# Patient Record
Sex: Female | Born: 1980 | Race: White | Hispanic: No | Marital: Married | Smoking: Former smoker
Health system: Southern US, Community
[De-identification: ages and names within clinical notes are randomized; demographics above are authoritative.]

## PROBLEM LIST (undated history)

## (undated) DIAGNOSIS — D649 Anemia, unspecified: Secondary | ICD-10-CM

## (undated) DIAGNOSIS — L719 Rosacea, unspecified: Secondary | ICD-10-CM

## (undated) DIAGNOSIS — D689 Coagulation defect, unspecified: Secondary | ICD-10-CM

## (undated) DIAGNOSIS — E119 Type 2 diabetes mellitus without complications: Secondary | ICD-10-CM

## (undated) DIAGNOSIS — I10 Essential (primary) hypertension: Secondary | ICD-10-CM

## (undated) DIAGNOSIS — R011 Cardiac murmur, unspecified: Secondary | ICD-10-CM

## (undated) DIAGNOSIS — K219 Gastro-esophageal reflux disease without esophagitis: Secondary | ICD-10-CM

## (undated) DIAGNOSIS — E7212 Methylenetetrahydrofolate reductase deficiency: Secondary | ICD-10-CM

## (undated) DIAGNOSIS — D759 Disease of blood and blood-forming organs, unspecified: Secondary | ICD-10-CM

## (undated) DIAGNOSIS — E039 Hypothyroidism, unspecified: Secondary | ICD-10-CM

## (undated) DIAGNOSIS — F411 Generalized anxiety disorder: Secondary | ICD-10-CM

## (undated) DIAGNOSIS — Z1589 Genetic susceptibility to other disease: Secondary | ICD-10-CM

## (undated) DIAGNOSIS — E669 Obesity, unspecified: Secondary | ICD-10-CM

## (undated) DIAGNOSIS — M2550 Pain in unspecified joint: Secondary | ICD-10-CM

## (undated) HISTORY — PX: DILATION AND CURETTAGE OF UTERUS: SHX78

## (undated) HISTORY — DX: Coagulation defect, unspecified: D68.9

## (undated) HISTORY — DX: Essential (primary) hypertension: I10

## (undated) HISTORY — DX: Generalized anxiety disorder: F41.1

## (undated) HISTORY — DX: Hypothyroidism, unspecified: E03.9

## (undated) HISTORY — DX: Rosacea, unspecified: L71.9

## (undated) HISTORY — DX: Obesity, unspecified: E66.9

## (undated) HISTORY — DX: Pain in unspecified joint: M25.50

## (undated) HISTORY — DX: Cardiac murmur, unspecified: R01.1

---

## 2007-01-15 HISTORY — PX: APPENDECTOMY: SHX54

## 2007-11-24 ENCOUNTER — Ambulatory Visit: Payer: Self-pay | Admitting: Unknown Physician Specialty

## 2007-11-30 ENCOUNTER — Emergency Department: Payer: Self-pay | Admitting: Emergency Medicine

## 2008-02-29 ENCOUNTER — Emergency Department: Payer: Self-pay | Admitting: Emergency Medicine

## 2008-12-25 ENCOUNTER — Emergency Department: Payer: Self-pay | Admitting: Emergency Medicine

## 2009-06-07 ENCOUNTER — Ambulatory Visit: Payer: Self-pay | Admitting: Obstetrics & Gynecology

## 2009-06-07 ENCOUNTER — Other Ambulatory Visit: Admission: RE | Admit: 2009-06-07 | Discharge: 2009-06-07 | Payer: Self-pay | Admitting: Obstetrics & Gynecology

## 2009-06-09 ENCOUNTER — Inpatient Hospital Stay: Payer: Self-pay | Admitting: General Surgery

## 2009-07-12 ENCOUNTER — Ambulatory Visit: Payer: Self-pay | Admitting: Obstetrics & Gynecology

## 2009-12-24 ENCOUNTER — Emergency Department: Payer: Self-pay | Admitting: Emergency Medicine

## 2010-02-06 ENCOUNTER — Other Ambulatory Visit: Payer: Self-pay | Admitting: Obstetrics and Gynecology

## 2010-03-06 ENCOUNTER — Observation Stay: Payer: Self-pay

## 2010-04-22 ENCOUNTER — Emergency Department: Payer: Self-pay | Admitting: Emergency Medicine

## 2010-05-29 NOTE — Assessment & Plan Note (Signed)
Anita Massey, Anita Massey          ACCOUNT NO.:  [id number]   MEDICAL RECORD NO.:  [id number]          PATIENT TYPE:  POB   LOCATION:  [facility] at [facility]         FACILITY:  [facility]   PHYSICIAN:  Jaynie Collins, MD     DATE OF BIRTH:  1980-10-12   DATE OF SERVICE:  06/07/2009                                  CLINIC NOTE   The patient is a 30 year old gravida 0 who is here for a repeat Pap  smear and also a rash on her nipples.  The patient also complains of  strong odor with urination which has been present for a few months.  As  for the rash in the nipple, she noted a month ago that she had some  pruritic rash around her nipple on the left.  She did use Neosporin but  it did not get better and it spread to her right side, it is very itchy  and dry and she does not notice any other breast masses or any other  systemic symptoms.  As for the strong odor with urination, she noted  this a few months ago.  She has been treated for UTI a few months ago  but her symptoms recurred.  She has no fevers, chills, sweats, nausea,  vomiting, or any other symptoms.   PAST OB/GYN HISTORY:  Her last menstrual period was 05/25/2009.  Menarche was at age 89.  She has regular cycles with 30 days between  cycles.  Her periods last for 3 to 4 days with medium flow and moderate  to severe pain.  She denies any intermenstrual bleeding.  The patient is  not on any contraception.  She reports her partner withdraws prior to  ejaculation and she does know that this is not a very good form of birth  control but does not mind getting pregnant.  She has never been  pregnant.  She has had two abnormal Pap smears in the past, last one was  in January 2011 and was an ASCUS, high-risk HPV, Pap.  Colposcopy was  significant for not having enough tissue for any diagnosis, so she was  just told she needs another Pap smear.  She has had one mammogram in the  past, which was normal.  She is not indicated for another  mammogram  before the age of 3 as of now.   PAST MEDICAL HISTORY:  Heart palpitations and high blood pressure.   PAST SURGICAL HISTORY:  None.   MEDICATIONS:  Metoprolol.   ALLERGIES:  No known drug allergies.   SOCIAL HISTORY:  The patient lives with her grandmother.  She is  employed.  She does not smoke, drink alcohol, or use any illicit drugs.  She denies any past or current history of sexual or physical abuse.   FAMILY HISTORY:  Remarkable for an extensive history of diabetes, high  blood pressure, and heart disease.  Her grandfather also had lung  cancer.  She denies any gynecologic cancers.   REVIEW OF SYSTEMS:  Remarkable for aching in legs, muscle aches,  fatigue, frequent headaches, dizzy spells, problems with hearing,  problems with breathing, chest pain, nausea, vaginal odor, and vaginal  itching.   PHYSICAL EXAMINATION:  VITAL  SIGNS:  Blood pressure is 129/91, pulse 68,  weight 168 pounds, height 5 feet 3 inches.  GENERAL:  No apparent distress.  HEENT:  Normocephalic, atraumatic.  NECK:  Supple.  No masses.  Normal thyroid.  LUNGS:  Clear to auscultation bilaterally.  HEART:  Regular rate and rhythm.  ABDOMEN:  Soft, nontender, nondistended.  EXTREMITIES:  No cyanosis, clubbing, or edema.  Nontender.  BREASTS:  The patient does have a maculopapular pink rash with some  excoriation noted on bilateral nipples with mild erythema.  No  induration noted.  No abnormal drainage noted.  Her breasts are,  otherwise, symmetric in size and soft, nontender on palpation.  No  abnormal masses, lymphadenopathy, or nipple drainage noted.  PELVIC:  Normal external female genitalia.  Pink, well rugated vagina.  Pap smear was obtained.  On bimanual exam, the patient has a normal-  sized uterus, nontender, and nontender adnexa.   ASSESSMENT AND PLAN:  The patient is a 30 year old gravida 0 here for  her repeat Pap smear and examination.  The patient also complains of a  nipple  rash.  On evaluation of the nipple rash, it is likely candidal  rash.  She was given a prescription for nystatin cream and told to apply  twice a day until she finishes the tube and she was told that if it  worsens or if it persists that she should come back as she might need  further evaluation which may include a biopsy or dermatologic or breast  center evaluation.  As for her preventative health maintenance and  history of cervical dysplasia, we will follow up with this Pap smear.  The role of HPV and abnormal Paps were discussed in detail with the  patient.  She was told that she would be 3 consecutive normal Pap smears  before returning to usual annual screening.  If the Pap smear is  abnormal, we will redo a colposcopy.  As for her strong odor with  urination, the patient urinalysis today showed positive nitrites,  moderate amount of blood, small amount of leukocyte.  A urine culture  was sent but she will be presumptively treated for urinary tract  infection.  She was given a prescription for ciprofloxacin 500 mg p.o.  b.i.d. for 7 days and told that if her symptoms persist after that she  might need evaluation for recurrent cystitis and might need to go to  Urology if this becomes a recurring problem.  The patient was told to  call or come back in if she has any further gynecologic concerns.           ______________________________  Jaynie Collins, MD     UA/MEDQ  D:  06/07/2009  T:  06/08/2009  Job:  [id number]

## 2010-07-19 ENCOUNTER — Inpatient Hospital Stay: Payer: Self-pay

## 2010-07-19 ENCOUNTER — Observation Stay: Payer: Self-pay

## 2011-04-09 DIAGNOSIS — R002 Palpitations: Secondary | ICD-10-CM | POA: Insufficient documentation

## 2011-05-04 ENCOUNTER — Emergency Department: Payer: Self-pay | Admitting: *Deleted

## 2011-05-04 LAB — CBC
HCT: 39 % (ref 35.0–47.0)
HGB: 13.1 g/dL (ref 12.0–16.0)
MCH: 29.5 pg (ref 26.0–34.0)
MCHC: 33.7 g/dL (ref 32.0–36.0)
MCV: 88 fL (ref 80–100)
Platelet: 264 10*3/uL (ref 150–440)
RBC: 4.46 10*6/uL (ref 3.80–5.20)
RDW: 12.2 % (ref 11.5–14.5)
WBC: 12.7 10*3/uL — ABNORMAL HIGH (ref 3.6–11.0)

## 2011-05-04 LAB — COMPREHENSIVE METABOLIC PANEL
Albumin: 3.9 g/dL (ref 3.4–5.0)
Bilirubin,Total: 0.6 mg/dL (ref 0.2–1.0)
Creatinine: 0.93 mg/dL (ref 0.60–1.30)
Glucose: 142 mg/dL — ABNORMAL HIGH (ref 65–99)
Potassium: 3.2 mmol/L — ABNORMAL LOW (ref 3.5–5.1)
SGOT(AST): 22 U/L (ref 15–37)
SGPT (ALT): 34 U/L
Sodium: 140 mmol/L (ref 136–145)

## 2011-05-04 LAB — LIPASE, BLOOD: Lipase: 52 U/L — ABNORMAL LOW (ref 73–393)

## 2011-12-03 ENCOUNTER — Emergency Department: Payer: Self-pay | Admitting: Emergency Medicine

## 2011-12-03 LAB — CBC
HGB: 13.1 g/dL (ref 12.0–16.0)
MCHC: 33.8 g/dL (ref 32.0–36.0)
Platelet: 272 10*3/uL (ref 150–440)
RBC: 4.36 10*6/uL (ref 3.80–5.20)
RDW: 12.5 % (ref 11.5–14.5)
WBC: 9.8 10*3/uL (ref 3.6–11.0)

## 2011-12-03 LAB — URINALYSIS, COMPLETE
Bacteria: NONE SEEN
Ketone: NEGATIVE
Protein: NEGATIVE
RBC,UR: 19 /HPF (ref 0–5)
Specific Gravity: 1.016 (ref 1.003–1.030)
Squamous Epithelial: 2
WBC UR: 1 /HPF (ref 0–5)

## 2011-12-03 LAB — COMPREHENSIVE METABOLIC PANEL
Alkaline Phosphatase: 130 U/L (ref 50–136)
Calcium, Total: 9.6 mg/dL (ref 8.5–10.1)
Chloride: 104 mmol/L (ref 98–107)
Co2: 28 mmol/L (ref 21–32)
EGFR (African American): >60
EGFR (Non-African Amer.): >60
Glucose: 97 mg/dL (ref 65–99)
SGOT(AST): 27 U/L (ref 15–37)
SGPT (ALT): 49 U/L (ref 12–78)
Sodium: 139 mmol/L (ref 136–145)

## 2012-08-03 ENCOUNTER — Emergency Department: Payer: Self-pay | Admitting: Emergency Medicine

## 2012-08-03 LAB — BASIC METABOLIC PANEL
Anion Gap: 7 (ref 7–16)
BUN: 9 mg/dL (ref 7–18)
Calcium, Total: 9.3 mg/dL (ref 8.5–10.1)
Chloride: 109 mmol/L — ABNORMAL HIGH (ref 98–107)
Co2: 23 mmol/L (ref 21–32)
EGFR (Non-African Amer.): >60
Glucose: 90 mg/dL (ref 65–99)
Potassium: 3.4 mmol/L — ABNORMAL LOW (ref 3.5–5.1)

## 2012-08-03 LAB — CBC
HCT: 37.7 % (ref 35.0–47.0)
MCH: 29.8 pg (ref 26.0–34.0)
MCHC: 33.9 g/dL (ref 32.0–36.0)
MCV: 88 fL (ref 80–100)
Platelet: 270 10*3/uL (ref 150–440)
RDW: 12.9 % (ref 11.5–14.5)
WBC: 13 10*3/uL — ABNORMAL HIGH (ref 3.6–11.0)

## 2012-08-03 LAB — HCG, QUANTITATIVE, PREGNANCY: Beta Hcg, Quant.: 25982 m[IU]/mL — ABNORMAL HIGH

## 2012-08-04 LAB — WET PREP, GENITAL

## 2012-08-04 LAB — GC/CHLAMYDIA PROBE AMP

## 2012-08-06 ENCOUNTER — Ambulatory Visit: Payer: Self-pay | Admitting: Obstetrics and Gynecology

## 2012-08-06 LAB — CBC
MCH: 30 pg (ref 26.0–34.0)
MCHC: 34.3 g/dL (ref 32.0–36.0)
MCV: 88 fL (ref 80–100)
Platelet: 263 10*3/uL (ref 150–440)
RDW: 13.1 % (ref 11.5–14.5)
WBC: 10.9 10*3/uL (ref 3.6–11.0)

## 2012-08-09 LAB — PATHOLOGY REPORT

## 2012-11-16 ENCOUNTER — Ambulatory Visit: Payer: Self-pay | Admitting: Oncology

## 2012-12-14 ENCOUNTER — Ambulatory Visit: Payer: Self-pay | Admitting: Oncology

## 2012-12-16 ENCOUNTER — Emergency Department: Payer: Self-pay | Admitting: Emergency Medicine

## 2012-12-16 LAB — COMPREHENSIVE METABOLIC PANEL
Alkaline Phosphatase: 141 U/L — ABNORMAL HIGH
Calcium, Total: 9.9 mg/dL (ref 8.5–10.1)
Creatinine: 0.59 mg/dL — ABNORMAL LOW (ref 0.60–1.30)
EGFR (African American): >60
EGFR (Non-African Amer.): >60
Glucose: 119 mg/dL — ABNORMAL HIGH (ref 65–99)
Potassium: 3.9 mmol/L (ref 3.5–5.1)
SGOT(AST): 37 U/L (ref 15–37)
SGPT (ALT): 33 U/L (ref 12–78)
Sodium: 140 mmol/L (ref 136–145)
Total Protein: 7.7 g/dL (ref 6.4–8.2)

## 2012-12-16 LAB — URINALYSIS, COMPLETE
Bilirubin,UR: NEGATIVE
Glucose,UR: NEGATIVE mg/dL (ref 0–75)
Ph: 6 (ref 4.5–8.0)
Protein: NEGATIVE
RBC,UR: 24 /HPF (ref 0–5)
Squamous Epithelial: 3
WBC UR: 4 /HPF (ref 0–5)

## 2012-12-16 LAB — CBC
HCT: 39.8 % (ref 35.0–47.0)
MCH: 29.3 pg (ref 26.0–34.0)
MCV: 85 fL (ref 80–100)
Platelet: 285 10*3/uL (ref 150–440)
WBC: 12.9 10*3/uL — ABNORMAL HIGH (ref 3.6–11.0)

## 2012-12-17 ENCOUNTER — Ambulatory Visit: Payer: Self-pay | Admitting: Oncology

## 2013-02-10 ENCOUNTER — Observation Stay: Payer: Self-pay | Admitting: Internal Medicine

## 2013-02-10 DIAGNOSIS — R002 Palpitations: Secondary | ICD-10-CM

## 2013-02-10 DIAGNOSIS — I10 Essential (primary) hypertension: Secondary | ICD-10-CM

## 2013-02-10 DIAGNOSIS — E876 Hypokalemia: Secondary | ICD-10-CM

## 2013-02-10 LAB — CBC
HCT: 38.1 % (ref 35.0–47.0)
HGB: 13.2 g/dL (ref 12.0–16.0)
MCH: 30.5 pg (ref 26.0–34.0)
MCHC: 34.7 g/dL (ref 32.0–36.0)
MCV: 88 fL (ref 80–100)
PLATELETS: 266 10*3/uL (ref 150–440)
RBC: 4.32 10*6/uL (ref 3.80–5.20)
RDW: 12.7 % (ref 11.5–14.5)
WBC: 11.5 10*3/uL — ABNORMAL HIGH (ref 3.6–11.0)

## 2013-02-10 LAB — URINALYSIS, COMPLETE
Bilirubin,UR: NEGATIVE
GLUCOSE, UR: NEGATIVE mg/dL (ref 0–75)
Ketone: NEGATIVE
LEUKOCYTE ESTERASE: NEGATIVE
NITRITE: NEGATIVE
PH: 6 (ref 4.5–8.0)
Protein: NEGATIVE
SPECIFIC GRAVITY: 1.004 (ref 1.003–1.030)

## 2013-02-10 LAB — BASIC METABOLIC PANEL
Anion Gap: 7 (ref 7–16)
BUN: 8 mg/dL (ref 7–18)
CREATININE: 0.76 mg/dL (ref 0.60–1.30)
Calcium, Total: 9.1 mg/dL (ref 8.5–10.1)
Chloride: 106 mmol/L (ref 98–107)
Co2: 25 mmol/L (ref 21–32)
EGFR (African American): >60
EGFR (Non-African Amer.): >60
Glucose: 112 mg/dL — ABNORMAL HIGH (ref 65–99)
Osmolality: 275 (ref 275–301)
POTASSIUM: 3.4 mmol/L — AB (ref 3.5–5.1)
Sodium: 138 mmol/L (ref 136–145)

## 2013-02-10 LAB — TSH: Thyroid Stimulating Horm: 3.28 u[IU]/mL

## 2013-02-10 LAB — TROPONIN I
Troponin-I: <0.02 ng/mL
Troponin-I: <0.02 ng/mL
Troponin-I: <0.02 ng/mL

## 2013-02-10 LAB — HCG, QUANTITATIVE, PREGNANCY: Beta Hcg, Quant.: <1 m[IU]/mL — ABNORMAL LOW

## 2013-02-10 LAB — HEMOGLOBIN A1C: HEMOGLOBIN A1C: 5.5 % (ref 4.2–6.3)

## 2013-02-10 LAB — MAGNESIUM: Magnesium: 1.7 mg/dL — ABNORMAL LOW

## 2013-02-11 ENCOUNTER — Telehealth: Payer: Self-pay | Admitting: *Deleted

## 2013-02-11 DIAGNOSIS — R002 Palpitations: Secondary | ICD-10-CM

## 2013-02-11 DIAGNOSIS — I059 Rheumatic mitral valve disease, unspecified: Secondary | ICD-10-CM

## 2013-02-11 DIAGNOSIS — R079 Chest pain, unspecified: Secondary | ICD-10-CM

## 2013-02-11 DIAGNOSIS — I10 Essential (primary) hypertension: Secondary | ICD-10-CM

## 2013-02-11 LAB — MAGNESIUM: Magnesium: 1.8 mg/dL

## 2013-02-11 LAB — CBC WITH DIFFERENTIAL/PLATELET
BASOS PCT: 0.4 %
Basophil #: 0 10*3/uL (ref 0.0–0.1)
Eosinophil #: 0.1 10*3/uL (ref 0.0–0.7)
Eosinophil %: 1.1 %
HCT: 37.2 % (ref 35.0–47.0)
HGB: 13 g/dL (ref 12.0–16.0)
Lymphocyte #: 2.8 10*3/uL (ref 1.0–3.6)
Lymphocyte %: 26.7 %
MCH: 31 pg (ref 26.0–34.0)
MCHC: 35 g/dL (ref 32.0–36.0)
MCV: 89 fL (ref 80–100)
MONO ABS: 0.8 x10 3/mm (ref 0.2–0.9)
Monocyte %: 7.4 %
NEUTROS ABS: 6.8 10*3/uL — AB (ref 1.4–6.5)
Neutrophil %: 64.4 %
PLATELETS: 248 10*3/uL (ref 150–440)
RBC: 4.2 10*6/uL (ref 3.80–5.20)
RDW: 13.1 % (ref 11.5–14.5)
WBC: 10.5 10*3/uL (ref 3.6–11.0)

## 2013-02-11 LAB — BASIC METABOLIC PANEL
ANION GAP: 5 — AB (ref 7–16)
BUN: 9 mg/dL (ref 7–18)
CALCIUM: 9.3 mg/dL (ref 8.5–10.1)
CHLORIDE: 108 mmol/L — AB (ref 98–107)
CO2: 24 mmol/L (ref 21–32)
CREATININE: 0.78 mg/dL (ref 0.60–1.30)
EGFR (Non-African Amer.): >60
Glucose: 95 mg/dL (ref 65–99)
Osmolality: 272 (ref 275–301)
Potassium: 3.9 mmol/L (ref 3.5–5.1)
SODIUM: 137 mmol/L (ref 136–145)

## 2013-02-11 LAB — LIPID PANEL
Cholesterol: 196 mg/dL (ref 0–200)
HDL: 35 mg/dL — AB (ref 40–60)
Ldl Cholesterol, Calc: 127 mg/dL — ABNORMAL HIGH (ref 0–100)
TRIGLYCERIDES: 172 mg/dL (ref 0–200)
VLDL CHOLESTEROL, CALC: 34 mg/dL (ref 5–40)

## 2013-02-15 ENCOUNTER — Emergency Department: Payer: Self-pay | Admitting: Emergency Medicine

## 2013-02-15 LAB — CBC WITH DIFFERENTIAL/PLATELET
BASOS ABS: 0.1 10*3/uL (ref 0.0–0.1)
BASOS PCT: 0.6 %
Eosinophil #: 0.2 10*3/uL (ref 0.0–0.7)
Eosinophil %: 1.3 %
HCT: 39.3 % (ref 35.0–47.0)
HGB: 13.5 g/dL (ref 12.0–16.0)
Lymphocyte #: 3.1 10*3/uL (ref 1.0–3.6)
Lymphocyte %: 25.7 %
MCH: 30.6 pg (ref 26.0–34.0)
MCHC: 34.2 g/dL (ref 32.0–36.0)
MCV: 89 fL (ref 80–100)
Monocyte #: 1.1 x10 3/mm — ABNORMAL HIGH (ref 0.2–0.9)
Monocyte %: 9.3 %
NEUTROS ABS: 7.5 10*3/uL — AB (ref 1.4–6.5)
Neutrophil %: 63.1 %
Platelet: 266 10*3/uL (ref 150–440)
RBC: 4.4 10*6/uL (ref 3.80–5.20)
RDW: 13 % (ref 11.5–14.5)
WBC: 12 10*3/uL — ABNORMAL HIGH (ref 3.6–11.0)

## 2013-02-15 LAB — COMPREHENSIVE METABOLIC PANEL
ALBUMIN: 3.7 g/dL (ref 3.4–5.0)
ALT: 30 U/L (ref 12–78)
Alkaline Phosphatase: 133 U/L — ABNORMAL HIGH
Anion Gap: 2 — ABNORMAL LOW (ref 7–16)
BUN: 11 mg/dL (ref 7–18)
Bilirubin,Total: 0.4 mg/dL (ref 0.2–1.0)
CREATININE: 0.77 mg/dL (ref 0.60–1.30)
Calcium, Total: 9.3 mg/dL (ref 8.5–10.1)
Chloride: 107 mmol/L (ref 98–107)
Co2: 28 mmol/L (ref 21–32)
Glucose: 79 mg/dL (ref 65–99)
Osmolality: 272 (ref 275–301)
Potassium: 3.8 mmol/L (ref 3.5–5.1)
SGOT(AST): 24 U/L (ref 15–37)
Sodium: 137 mmol/L (ref 136–145)
Total Protein: 7.2 g/dL (ref 6.4–8.2)

## 2013-02-15 LAB — LIPASE, BLOOD: Lipase: 84 U/L (ref 73–393)

## 2013-02-15 NOTE — Telephone Encounter (Signed)
Left voicemail for tcm

## 2013-02-16 NOTE — Telephone Encounter (Signed)
Attempted to call patient * 3 for TCM. No reply

## 2013-02-23 ENCOUNTER — Encounter: Payer: Self-pay | Admitting: *Deleted

## 2013-03-01 ENCOUNTER — Encounter: Payer: Self-pay | Admitting: *Deleted

## 2013-03-01 ENCOUNTER — Encounter: Payer: No Typology Code available for payment source | Admitting: Cardiovascular Disease

## 2013-03-01 ENCOUNTER — Encounter: Payer: Self-pay | Admitting: Cardiovascular Disease

## 2013-03-01 ENCOUNTER — Encounter (INDEPENDENT_AMBULATORY_CARE_PROVIDER_SITE_OTHER): Payer: Self-pay

## 2013-03-01 ENCOUNTER — Ambulatory Visit (INDEPENDENT_AMBULATORY_CARE_PROVIDER_SITE_OTHER): Payer: Medicaid Other | Admitting: Cardiovascular Disease

## 2013-03-01 ENCOUNTER — Other Ambulatory Visit: Payer: Self-pay | Admitting: Cardiovascular Disease

## 2013-03-01 VITALS — BP 145/85 | HR 61 | Ht 62.0 in | Wt 172.2 lb

## 2013-03-01 DIAGNOSIS — I10 Essential (primary) hypertension: Secondary | ICD-10-CM

## 2013-03-01 DIAGNOSIS — R0989 Other specified symptoms and signs involving the circulatory and respiratory systems: Secondary | ICD-10-CM | POA: Insufficient documentation

## 2013-03-01 DIAGNOSIS — R079 Chest pain, unspecified: Secondary | ICD-10-CM

## 2013-03-01 NOTE — Progress Notes (Signed)
Primary care physician: Dr. Tomasita Morrow  HPI  This is a pleasant 33 year old female who is here today for a followup visit after recent hospitalization at [facility] for hypertensive urgency. I still the patient in the past in 2010 at [facility] for palpitations. Holter monitor showed no significant arrhythmia other than sinus tachycardia. She had significant chest pain and dyspnea and underwent extensive workup at that time including negative CT of the chest for pulmonary embolism. Echocardiogram showed normal LV systolic function with trace pericardial effusion. Stress test showed no evidence of ischemia. She was noted to be mildly hypertensive and was started on small dose metoprolol with subsequent improvement in her symptoms. She has been trying to get pregnant and had 2 miscarriages in the past few years. She was diagnosed with a hypercoagulable state that might require anticoagulation urine pregnancy. She has been having episodes of sudden increase in blood pressure associated with palpitations. She had an episode recently at 3:00 in the morning. Blood pressure was 201/100 with a heart rate of 106 beats per minute. She went to the emergency room at [facility] and was hospitalized overnight. Cardiac enzymes were negative. Echocardiogram showed normal LV systolic function, mild left ventricular hypertrophy and mild mitral regurgitation. Vital signs were stable. Labetalol was increased to 100 mg twice daily. After hospital discharge, she had another similar episode and went to the emergency room at [facility]. She was not hospitalized. She was seen by her primary care physician recently. The dose of Zoloft was increased she was also given clonazepam for suspected anxiety.  No Known Allergies   No current outpatient prescriptions on file prior to visit.   No current facility-administered medications on file prior to visit.     Past Medical History  Diagnosis Date  . Dysuria   . Anxiety state,  unspecified   . Unspecified essential hypertension   . Rosacea   . Contact dermatitis and other eczema, due to unspecified cause   . Undiagnosed cardiac murmurs   . Palpitations   . Other psoriasis   . Obesity   . Heart palpitations   . Syncope and collapse   . Clotting disorder      Past Surgical History  Procedure Laterality Date  . Appendectomy    . Dilation and curettage of uterus       Family History  Problem Relation Age of Onset  . Mitral valve prolapse Mother   . Arrhythmia Mother   . Hypertension Mother   . Heart disease Maternal Grandfather   . Lung cancer Maternal Grandfather      History   Social History  . Marital Status: Married    Spouse Name: N/A    Number of Children: N/A  . Years of Education: N/A   Occupational History  . Not on file.   Social History Main Topics  . Smoking status: Former Smoker -- 0.25 packs/day for 1 years  . Smokeless tobacco: Not on file  . Alcohol Use: No  . Drug Use: No  . Sexual Activity: Not on file   Other Topics Concern  . Not on file   Social History Narrative  . No narrative on file     PHYSICAL EXAM   BP 145/85  Pulse 61  Ht 5\' 2"  (1.575 m)  Wt 172 lb 4 oz (78.132 kg)  BMI 31.50 kg/m2 Constitutional: She is oriented to person, place, and time. She appears well-developed and well-nourished. No distress.  HENT: No nasal discharge.  Head: Normocephalic and  atraumatic.  Eyes: Pupils are equal and round. No discharge.  Neck: Normal range of motion. Neck supple. No JVD present. No thyromegaly present.  Cardiovascular: Normal rate, regular rhythm, normal heart sounds. Exam reveals no gallop and no friction rub. No murmur heard.  Pulmonary/Chest: Effort normal and breath sounds normal. No stridor. No respiratory distress. She has no wheezes. She has no rales. She exhibits no tenderness.  Abdominal: Soft. Bowel sounds are normal. She exhibits no distension. There is no tenderness. There is no rebound and  no guarding.  Musculoskeletal: Normal range of motion. She exhibits no edema and no tenderness.  Neurological: She is alert and oriented to person, place, and time. Coordination normal.  Skin: Skin is warm and dry. No rash noted. She is not diaphoretic. No erythema. No pallor.  Psychiatric: She has a normal mood and affect. Her behavior is normal. Judgment and thought content normal.     EKG: Normal sinus rhythm with nonspecific T wave changes   ASSESSMENT AND PLAN

## 2013-03-01 NOTE — Patient Instructions (Signed)
Labs today.   24 hour urine collection for labs.   Your physician has requested that you have a renal artery duplex. During this test, an ultrasound is used to evaluate blood flow to the kidneys. Allow one hour for this exam. Do not eat after midnight the day before and avoid carbonated beverages. Take your medications as you usually do.  You can take 2 extra doses of Labetalol if needed (if blood pressure is above 160 ).   Follow up after tests.

## 2013-03-01 NOTE — Assessment & Plan Note (Signed)
The patient is having recurrent episodes of severe hypertension associated with palpitations and shortness of breath. Blood pressure usually goes back to normal quickly. Although this could certainly be due to panic attacks and anxiety, I do think we have to exclude other causes of secondary hypertension especially pheochromocytoma. Thus, I ordered 24 hour urine exam of metanephrine and catecholamines. I will also check aldosterone to renin ratio and cortisol level. TSH has been normal. Check renal artery duplex ultrasound to ensure no fibromuscular dysplasia. In the meanwhile, I instructed not to take an extra dose of labetalol as needed for blood pressure above 809 systolic.

## 2013-03-02 ENCOUNTER — Encounter: Payer: Self-pay | Admitting: Cardiovascular Disease

## 2013-03-03 ENCOUNTER — Telehealth: Payer: Self-pay | Admitting: *Deleted

## 2013-03-03 NOTE — Telephone Encounter (Signed)
Patient called and said she accidentally left her 24 hr urine out for 4 hrs. Patient instructed to call the [facility] lab to see if she needs to start over.

## 2013-03-15 ENCOUNTER — Telehealth: Payer: Self-pay

## 2013-03-15 NOTE — Telephone Encounter (Signed)
Results awaiting MD note. Patient aware.

## 2013-03-15 NOTE — Telephone Encounter (Signed)
Pt would like 24 hr urine test results. Please call.

## 2013-03-18 ENCOUNTER — Telehealth: Payer: Self-pay | Admitting: *Deleted

## 2013-03-18 ENCOUNTER — Ambulatory Visit: Payer: No Typology Code available for payment source | Admitting: Cardiovascular Disease

## 2013-03-18 NOTE — Telephone Encounter (Signed)
Normal so far.

## 2013-03-18 NOTE — Telephone Encounter (Signed)
Reviewed results with patient. 

## 2013-03-18 NOTE — Telephone Encounter (Signed)
Patient called wanting results of 24 urine test

## 2013-03-26 ENCOUNTER — Encounter (INDEPENDENT_AMBULATORY_CARE_PROVIDER_SITE_OTHER): Payer: No Typology Code available for payment source

## 2013-03-26 DIAGNOSIS — I10 Essential (primary) hypertension: Secondary | ICD-10-CM

## 2013-03-30 ENCOUNTER — Ambulatory Visit (INDEPENDENT_AMBULATORY_CARE_PROVIDER_SITE_OTHER): Payer: No Typology Code available for payment source | Admitting: Cardiovascular Disease

## 2013-03-30 ENCOUNTER — Encounter: Payer: Self-pay | Admitting: Cardiovascular Disease

## 2013-03-30 VITALS — BP 122/86 | HR 72 | Ht 62.0 in | Wt 174.5 lb

## 2013-03-30 DIAGNOSIS — I10 Essential (primary) hypertension: Secondary | ICD-10-CM

## 2013-03-30 DIAGNOSIS — R079 Chest pain, unspecified: Secondary | ICD-10-CM

## 2013-03-30 NOTE — Patient Instructions (Signed)
Continue same medications.   Your physician wants you to follow-up in: 6 months.  You will receive a reminder letter in the mail two months in advance. If you don't receive a letter, please call our office to schedule the follow-up appointment.  

## 2013-03-30 NOTE — Progress Notes (Signed)
Primary care physician: Dr. Tomasita Morrow  HPI  This is a pleasant 33 year old female who is here today for a followup visit regarding hypertension.  I saw the patient in the past in 2010 at [facility] for palpitations. Holter monitor showed no significant arrhythmia other than sinus tachycardia. She had significant chest pain and dyspnea and underwent extensive workup at that time including negative CT of the chest for pulmonary embolism. Echocardiogram showed normal LV systolic function with trace pericardial effusion. Stress test showed no evidence of ischemia. She was noted to be mildly hypertensive and was started on small dose metoprolol with subsequent improvement in her symptoms. She has been trying to get pregnant and had 2 miscarriages in the past few years. She was diagnosed with a hypercoagulable state that might require anticoagulation urine pregnancy. She was seen recently for  episodes of sudden increase in blood pressure associated with palpitations. She had an episode which required hospitalization at [facility]. Blood pressure was 201/100 with a heart rate of 106 beats per minute.  Echocardiogram showed normal LV systolic function, mild left ventricular hypertrophy and mild mitral regurgitation. Vital signs were stable. Labetalol was increased to 100 mg twice daily. After hospital discharge, she had another similar episode and went to the emergency room at [facility]. The dose of Zoloft was increased she was also given clonazepam for suspected anxiety. I proceeded with evaluation for secondary hypertension. Renal artery duplex showed no evidence artery stenosis. Aldosterone to renin ratio was normal. Cortisol level was normal. 24 hour urine collection for catecholamines and metanephrines was also normal. She has been doing well with no further episodes.  No Known Allergies   Current Outpatient Prescriptions on File Prior to Visit  Medication Sig Dispense Refill  . clonazePAM  (KLONOPIN) 0.5 MG tablet Take 0.5 mg by mouth 2 (two) times daily as needed for anxiety.      . folic acid (FOLVITE) 1 MG tablet Take 3 mg by mouth daily.      Marland Kitchen labetalol (NORMODYNE) 100 MG tablet Take 100 mg by mouth 2 (two) times daily.      . metroNIDAZOLE (METROCREAM) 0.75 % cream Apply 1 application topically 2 (two) times daily.      . Prenatal Vit-Fe Fumarate-FA (PRENATAL MULTIVITAMIN) TABS tablet Take 1 tablet by mouth daily at 12 noon.      . promethazine (PHENERGAN) 25 MG tablet Take 12.5-25 mg by mouth every 6 (six) hours as needed for nausea or vomiting.      . sertraline (ZOLOFT) 50 MG tablet Take 50 mg by mouth daily.       No current facility-administered medications on file prior to visit.     Past Medical History  Diagnosis Date  . Hypertension   . Dysuria   . Anxiety state, unspecified   . Unspecified essential hypertension   . Rosacea   . Contact dermatitis and other eczema, due to unspecified cause   . Undiagnosed cardiac murmurs   . Palpitations   . Other psoriasis   . Obesity   . Heart palpitations   . Syncope and collapse   . Clotting disorder      Past Surgical History  Procedure Laterality Date  . Appendectomy    . Dilation and curettage of uterus       Family History  Problem Relation Age of Onset  . Mitral valve prolapse Mother   . Arrhythmia Mother   . Hypertension Mother   . Heart disease Maternal Grandfather   .  Lung cancer Maternal Grandfather      History   Social History  . Marital Status: Married    Spouse Name: N/A    Number of Children: N/A  . Years of Education: N/A   Occupational History  . Not on file.   Social History Main Topics  . Smoking status: Former Smoker -- 0.25 packs/day for 1 years  . Smokeless tobacco: Not on file  . Alcohol Use: No  . Drug Use: No  . Sexual Activity: Not on file   Other Topics Concern  . Not on file   Social History Narrative   ** Merged History Encounter **         PHYSICAL  EXAM   BP 122/86  Pulse 72  Ht 5\' 2"  (1.575 m)  Wt 174 lb 8 oz (79.153 kg)  BMI 31.91 kg/m2 Constitutional: She is oriented to person, place, and time. She appears well-developed and well-nourished. No distress.  HENT: No nasal discharge.  Head: Normocephalic and atraumatic.  Eyes: Pupils are equal and round. No discharge.  Neck: Normal range of motion. Neck supple. No JVD present. No thyromegaly present.  Cardiovascular: Normal rate, regular rhythm, normal heart sounds. Exam reveals no gallop and no friction rub. No murmur heard.  Pulmonary/Chest: Effort normal and breath sounds normal. No stridor. No respiratory distress. She has no wheezes. She has no rales. She exhibits no tenderness.  Abdominal: Soft. Bowel sounds are normal. She exhibits no distension. There is no tenderness. There is no rebound and no guarding.  Musculoskeletal: Normal range of motion. She exhibits no edema and no tenderness.  Neurological: She is alert and oriented to person, place, and time. Coordination normal.  Skin: Skin is warm and dry. No rash noted. She is not diaphoretic. No erythema. No pallor.  Psychiatric: She has a normal mood and affect. Her behavior is normal. Judgment and thought content normal.     EKG: Normal sinus rhythm with nonspecific T wave changes   ASSESSMENT AND PLAN

## 2013-03-30 NOTE — Assessment & Plan Note (Signed)
Episodes of hypertensive crises were likely triggered by anxiety. Workup for secondary hypertension was negative as outlined above. There is no evidence of fibromuscular dysplasia or pheochromocytoma. Continue treatment with labetalol. Anxiety seems to have improved significantly with treatment. I don't see a contraindication for pregnancy from a cardiac standpoint.

## 2013-04-29 DIAGNOSIS — R319 Hematuria, unspecified: Secondary | ICD-10-CM | POA: Insufficient documentation

## 2013-04-29 DIAGNOSIS — R300 Dysuria: Secondary | ICD-10-CM | POA: Insufficient documentation

## 2013-05-31 ENCOUNTER — Ambulatory Visit: Payer: Self-pay | Admitting: Oncology

## 2013-06-06 ENCOUNTER — Other Ambulatory Visit: Payer: Self-pay | Admitting: Obstetrics and Gynecology

## 2013-06-06 LAB — HCG, QUANTITATIVE, PREGNANCY: Beta Hcg, Quant.: 1536 m[IU]/mL — ABNORMAL HIGH

## 2013-06-08 ENCOUNTER — Other Ambulatory Visit: Payer: Self-pay | Admitting: Obstetrics and Gynecology

## 2013-06-08 LAB — HCG, QUANTITATIVE, PREGNANCY: Beta Hcg, Quant.: 1562 m[IU]/mL — ABNORMAL HIGH

## 2013-06-14 ENCOUNTER — Ambulatory Visit: Payer: Self-pay | Admitting: Oncology

## 2013-10-25 ENCOUNTER — Encounter: Payer: Self-pay | Admitting: Maternal & Fetal Medicine

## 2014-01-29 ENCOUNTER — Observation Stay: Payer: Self-pay | Admitting: Obstetrics & Gynecology

## 2014-01-29 LAB — URINALYSIS, COMPLETE
Bilirubin,UR: NEGATIVE
GLUCOSE, UR: NEGATIVE mg/dL (ref 0–75)
Nitrite: NEGATIVE
PH: 6 (ref 4.5–8.0)
Protein: NEGATIVE
RBC,UR: 2 /HPF (ref 0–5)
SPECIFIC GRAVITY: 1.009 (ref 1.003–1.030)
Squamous Epithelial: 2
WBC UR: 3 /HPF (ref 0–5)

## 2014-01-30 LAB — URINE CULTURE

## 2014-03-12 ENCOUNTER — Inpatient Hospital Stay: Payer: Self-pay | Admitting: Obstetrics and Gynecology

## 2014-05-06 NOTE — Op Note (Signed)
PATIENT NAME:  Anita Massey, Anita Massey MR#:  [id number] DATE OF BIRTH:  Jul 15, 1980  DATE OF PROCEDURE:  08/06/2012  PREOPERATIVE DIAGNOSIS: Incomplete abortion.  POSTOPERATIVE DIAGNOSIS: Incomplete abortion.  PROCEDURE: Suction curette D and C.   ESTIMATED BLOOD LOSS: Approximately 300 mL.   SURGEON: Delsa Sale, MD  FINDINGS: What appeared to be products of conception (Dictation Anomaly) and pieces placenta along with blood clots. Approximately 12 cm uterus (Dictation Anomaly) to sounding with products of conception seen on suction.   DESCRIPTION OF PROCEDURE: The patient was taken to the Operating Room and placed in supine position. After adequate general endotracheal anesthesia was instilled, the patient was prepped and draped in the usual sterile fashion. A side-opening speculum was placed in the patient's vagina. The anterior lip of her cervix was grasped with a single-tooth tenaculum. The uterus was sounded and was also found to be already dilated. The curette was placed into the uterus and the suction curette was placed after removal of the curette. The patient's uterus was curetted with a curved curette. A curette was then performed circumferentially around the interior of the uterus to be sure there were no products of conception left. With gentle traction, the lining of the uterus was found to have a gritty feeling to it. The uterus was massaged and found to be firm. The single-tooth tenaculum was removed. The side-opening speculum was removed. Clear  urine was noted in the Foley bag. The patient was taken to recovery after having tolerated the procedure well.    ____________________________ Delsa Sale, MD cck:jm D: 08/10/2012 17:29:07 ET T: 08/10/2012 19:56:13 ET JOB#: [id number]  cc: Delsa Sale, MD, <Dictator> Delsa Sale MD ELECTRONICALLY SIGNED 08/12/2012 9:57

## 2014-05-07 NOTE — H&P (Signed)
PATIENT NAME:  Anita Massey, Anita Massey MR#:  [id number] DATE OF BIRTH:  07/07/1980  DATE OF ADMISSION:  02/10/2013  PRIMARY CARE PHYSICIAN:  Tomasita Morrow, MD REQUESTING PHYSICIAN: Dr. Dahlia Client.   CHIEF COMPLAINT: Dizziness.   HISTORY OF PRESENT ILLNESS: The patient is a 34 year old white female with a known history of anxiety, palpitation, tachycardia who is being admitted for dizziness and malignant hypertension along with palpitations. The patient reports waking up with significant palpitation, feeling that her heart is flying. She felt like she was going to vomit although she did not. She also felt like she was going to pass out while she was sitting on the toilet, and she checked her blood pressure as that has been an issue lately, and she noticed her blood pressure to be 201/117. Her heart rate was up to 106. She could not control her heart rate and palpitation and decided to come to the Emergency Department.   While in the ED, she was still tachycardic up to 111 per minute and, as per the ED physician, she had some flipped T-wave changes in inferior leads. She has been struggling with her blood pressure control and has been trying labetalol as per her OB/GYN due to her trying very actively for getting pregnant. She has had 2 miscarriages and was followed by Dr. Grayland Ormond. Was found to have possible clotting disorder.   She has also been recently treated for bacterial vaginosis and UTI with antibiotics.   When I evaluated the patient, she was feeling much better, did not have palpitation, but seems somewhat odd personality. She denies any chest pain at this time.   PAST MEDICAL HISTORY:  1.  Hypertension.  2.  Obesity.  3.  Palpitations.   PAST SURGICAL HISTORY:  1.  Appendectomy  2.  D and C.   ALLERGIES: No known drug allergies.  SOCIAL HISTORY: No smoking, alcohol use, or drug use.   FAMILY HISTORY:  1.  Mother with mitral valve prolapse.  2.  Maternal grandfather had coronary artery  disease status post CABG x7. Grandfather also had lung cancer.  3.  Hypertension and diabetes runs in the family.   MEDICATIONS AT HOME:  1.  Folic acid.  2.  Prenatal vitamins.  3.  Labetalol. Does not know the dose.   REVIEW OF SYSTEMS:  CONSTITUTIONAL: No fever, fatigue, weakness.  EYES: No blurred or double vision.  ENT: No tinnitus or ear pain.  RESPIRATORY: No cough, wheezing, hemoptysis.  CARDIOVASCULAR: Positive for palpitations and dizziness.  GASTROINTESTINAL: Positive for nausea and vomiting.   GENITOURINARY: No dysuria or hematuria.  ENDOCRINE: No polyuria or nocturia.  HEMATOLOGY: No anemia or easy bruising.  SKIN: No rash or lesion.  MUSCULOSKELETAL: No arthritis or muscle cramp.  NEUROLOGICAL: Positive for dizziness and lightheadedness.  PSYCHIATRIC: History of anxiety positive .   PHYSICAL EXAMINATION:  VITAL SIGNS: Temperature 98.4, heart rate 111 per minute, respirations 18 per minute, blood pressure 144/91 mmHg. She is saturating 98% on room air.  GENERAL: A 34 year old anxious-looking female lying in the bed comfortably without any acute distress.  EYES: Pupils equal, round and reactive to accommodation. No scleral icterus. Extraocular muscles intact.  HEENT: Head atraumatic, normocephalic. Oropharynx and nasopharynx clear.  NECK: Supple. No jugular venous distention. No thyroid enlargement or tenderness.  LUNGS: Clear to auscultation bilaterally. No wheezing or rales. No crepitation.  CARDIOVASCULAR: S1, S2 normal. Systolic ejection murmur present at the right upper sternal border, 2/6.  ABDOMEN: Soft, nontender, nondistended. Bowel sounds present.  No organomegaly or masses.  EXTREMITIES: No pedal edema, cyanosis or clubbing.  NEUROLOGIC: Nonfocal examination. Cranial nerves II through XII are intact. Muscle strength 5/5 in all extremities. Sensation intact.  PSYCHIATRIC: Alert and oriented x3. She seems somewhat anxious.  SKIN: No obvious rash, lesion, or  ulcer.   LABORATORY PANEL: Normal BMP except potassium of 3.4, magnesium 1.7. Negative troponin. Normal TSH. Normal CBC except white count of 11.5. Negative urinalysis.   Chest x-ray done in the ED showed no active cardiopulmonary disease.   CT scan of the head without contrast was negative for any acute pathology.   EKG shows downsloping ST depression in leads II, III, aVF, V5 and V6 with some narrow Q waves, borderline LVH which is changed from previous EKG in December 2014.   IMPRESSION AND PLAN:  1.  Malignant hypertension, which seemed to be already improving. She does have very labile hypertension. For now, we will resume back her home medication and monitor and adjust as needed. Will check beta hCG. Her urine pregnancy is negative. Cannot rule out ectopic pregnancy at this time. Will check 2-D echocardiogram and consult cardiology, get serial enzymes.  2.  Palpitation and dizziness, certainly could be from anxiety. Will get a cardiology evaluation. Rule out any kind of arrhythmias. Monitor on telemetry. Continue labetalol.  3.  Hypokalemia/hypomagnesemia. We will replete and recheck.  4.  Obesity with a BMI of 32. She was counseled for diet and exercise.   CODE STATUS: FULL CODE.   TOTAL TIME TAKING CARE OF THIS PATIENT: 55 minutes.  ____________________________ Lucina Mellow. Manuella Ghazi, MD vss:np D: 02/10/2013 17:01:04 ET T: 02/10/2013 17:45:43 ET JOB#: [id number]  cc: Donzella Carrol S. Manuella Ghazi, MD, <Dictator> Myrle Sheng. Jimmye Norman, [place] MD ELECTRONICALLY SIGNED 02/13/2013 14:47

## 2014-05-07 NOTE — Discharge Summary (Signed)
PATIENT NAME:  Anita Massey, Anita Massey MR#:  [id number] DATE OF BIRTH:  December 26, 1980  DATE OF ADMISSION:  02/10/2013 DATE OF DISCHARGE:  02/11/2013  DISCHARGE DIAGNOSES: 1.  Malignant hypertension, now resolved. Blood pressure has been very labile, worrisome for secondary etiology for hypertension. Will require outpatient work-up with cardiology. 2. Palpitation, now resolved. Could be due to underlying pheochromocytoma. Will require outpatient workup. Will increase the dose of beta blocker per cardiology recommendation.  3.  Hypomagnesemia/hypokalemia, repleted.   SECONDARY DIAGNOSES: 1.  Hypertension.  2.  Obesity.  3.  Palpitations.   CONSULTATION: Cardiology, Dr. Kathlyn Sacramento.    PROCEDURE/RADIOLOGY: A 2-D echocardiogram on the 29th of January showed ejection fraction of 60% to 65%, mild concentric LVH, mild tricuspid regurgitation.   Chest x-ray on the 28th of January showed no acute cardiopulmonary disease.   CT scan of the head without contrast on the 28th of January showed no acute pathology.   LABORATORY PANEL:  Urinalysis on admission was negative.   HISTORY AND SHORT HOSPITAL COURSE:  The patient is a 34 year old female with a long history of medical problems, was admitted for palpitations and dizziness. Please see Dr. Trena Platt dictated history and physical for further details. Cardiology consultation was obtained with Dr. Kathlyn Sacramento. The patient was also noted to have malignant hypertension which was resolved during the stay in the hospital. Considering her labile hypertension, concern was raised for possible secondary causes of hypertension, for which outpatient workup will be required per cardiology. A 2-D echocardiogram was obtained, as per recommendation from cardiology, which was within normal limits. The patient was feeling much better, did not have any further palpitations after increasing the dose of beta blocker, and her electrolytes were repleted, was discharged home in stable  condition on the 29th of January. On the date of discharge, her vital signs were as follows: Temperature 97.9, heart rate 80 per minute, respirations 18 per minute, blood pressure 116/81. She was saturating 98% on room air.   PERTINENT PHYSICAL EXAMINATION ON THE DATE OF DISCHARGE:  CARDIOVASCULAR: S1, S2 normal. No murmurs or gallop.  LUNGS: Clear to auscultation bilaterally. No wheezing, rales, rhonchi or crepitation.  ABDOMEN: Soft, benign.  NEUROLOGIC: Nonfocal examination.   All other physical examination remained at baseline.   DISCHARGE MEDICATIONS: Prenatal vitamin once daily, folic acid 1 mg 2 tablets p.o. daily, Labetalol 100 mg p.o. b.i.d.   DISCHARGE DIET: Low sodium.   DISCHARGE ACTIVITY: As tolerated.   DISCHARGE INSTRUCTIONS AND FOLLOWUP:  The patient was instructed to follow up with her primary care physician, Dr. Tomasita Morrow, in 2 to 4 weeks. She will need follow-up with Dr. Kathlyn Sacramento in 1 to 2 weeks.   Time discharging this patient  40 minutes.    ____________________________ Lucina Mellow. Manuella Ghazi, MD vss:NTS D: 02/11/2013 21:54:47 ET T: 02/12/2013 03:29:59 ET JOB#: [id number]  cc: Batya Citron S. Manuella Ghazi, MD, <Dictator> Muhammad A. Fletcher Anon, [place] Jimmye Norman, MD Remer Macho MD ELECTRONICALLY SIGNED 02/13/2013 14:49

## 2014-05-07 NOTE — Consult Note (Signed)
Referral Information:  Reason for Referral 34 yo G5P1031 at 18/2 weeks by US performed at [facility] on 08/04/13; measurements were consistent with 6 weeks 2 days and EDD 03/28/14.  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  She underwent thrombophia screening due to recurrent pregnancy loss.  She has no personal history of venous thromboembolism (VTE).   Referring Physician Dr. Georgianne Fick, [facility] obgyn   Prenatal Hx as above   Past Obstetrical Hx she reported 2 first trimester losses and 1 ectopic (received methotrexate) 2012, Spontaneous Vaginal Delivery, 5lb 4 oz, female, 4th degree laceration, [facility]   Home Medications: Medication Instructions Status  enoxaparin 40 mg/0.4 mL injectable solution 40 milligram(s) injectable once a day Active  labetalol 100 mg oral tablet 1 tab(s) orally 2 times a day Active  multivitamin, prenatal 1 tab(s) orally once a day Active  folic acid 1 mg oral tablet 3 tab(s) orally once a day (in the morning) Active  Tylenol 500 mg oral tablet 2 tab(s) orally every 6 hours, As Needed - for Pain Active  Flexeril 1 tab(s) orally every 8 hours, As Needed - for Pain Active  Zoloft 50 mg oral tablet 1 tab(s) orally once a day Active   Allergies:   No Known Allergies:   Vital Signs/Notes:  Nursing Vital Signs: **Vital Signs.:   12-Oct-15 10:03  Vital Signs Type Routine  Temperature Temperature (F) 97.1  Celsius 36.1  Temperature Source axillary  Pulse Pulse 85  Respirations Respirations 18  Systolic BP Systolic BP 762  Diastolic BP (mmHg) Diastolic BP (mmHg) 74  Mean BP 86  Pulse Ox % Pulse Ox % 98  Pulse Ox Activity Level  At rest  Oxygen Delivery Room Air/ 21 %   Perinatal Consult:  PGyn Hx abnormal Paps   PMed Hx Rubella Immune, Hx of varicella   Past Medical History cont'd 10/08/13 A positive, wbc 12.5K, MCV 87, RDW 13.6% (nl), Hct 37.8, plt 328K   PSurg Hx  appendectomy, dilation and curretage, colposcopy   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  The MTHFR gene mutation is not associated with pregnancy loss or increased risk for thrombosis in the setting of normal homocysteine levels.   --heterozgosity for the prothrombin gene mutation is consisdered a low risk mutation in the setting of pregnancy.  In patients with no personal history of venous thromboembolism and a low risk mutation, anticoagulation during pregnancy is not generally indicated.  Postpartum anticoagulation can be considered in the setting of additional risk factors such as maternal obesity (ACOG, Bulletin, 123).  2. Maternal Hypertension--her blood pressure is well controlled.  Pregnancy should be monitored for placenta medicated disorders such as  growth restriction (surveillance outlined below) superimposed preeclampsia, and abruption (rare in setting of good bp control).  --initiation of low dose aspirin can reduce her risk for preeclampsia 3. Depression--we addressed continuation of her Zoloft (SSRI) and addressed the safety of this medication in pregnancy.  Zoloft is not associated with an increase  in teratogenicity.  SSRI have been associated with transient tachypnea of the newborn.  4. Obesity, class 1--patient at increased risk for preeclampsia, gestational diabetes, VTE, and depression. 5. Previous child with cleft lip, ?spina bifida occulta--pt is on  folic acid supplementation 6. LATEX ALLERGY   Recommendations I would recommend cessation of Lovenox 40mg  subcutaneous daily at this time and reinitiation postpartum.  We generally recommend starting lovenox 12 hours after vaginal delivery and 24 hours after cesarean section.   --Recommend checking LMWH level and CBC approximately one week after initation of Lovenox --Recommend SCDs in labor --initate low dose aspirin 2. Hypertension--recommend serial growth ultrasounds every 4 weeks beginning ~24 weeks baseline labs (p:c ratio, liver function testing and creatinine) if not already performed.   --Baseline ECG if not performed within last 2-3 years 3. Depression--continue SSRI, we addressed risk for postpartum depression--she was aware of this risk  4. Obesity, class 1--patient is already taking additional folic acid,   recommend weight gain of 10-15lbs, baseline screening labs (see hypertension recommendations), TSH and early glucola.   Plan:  Genetic Counseling no   Prenatal Diagnosis Options Level II [place]   Ultrasound at what gestational ages Monthly >24 weeks   Antepartum Testing Starting at 32 weeks, Weekly, twice weekly testing beginning at 36 weeks, sooner if  clinically indicated   Delivery Mode Vaginal   Additional Testing Thyroid panel, Folate/prenatal vitamins   Delivery at what gestational age [redacted] weeks, sooner if clinically indicated (eg preeclamspia, IUGR)    Total Time Spent with Patient 30 minutes   >50% of visit spent in couseling/coordination of care yes   Office Use Only [address]  Level 2 (75min) NEW office consult exp prob focused   Coding Description: MATERNAL CONDITIONS/HISTORY INDICATION(S).   Bleeding Disorder and/or Pt on heparin/coumadin/lovenox.   Chronic HTN.  Electronic Signatures: Manfred Shirts (MD)  (Signed 12-Oct-15 15:54)  Authored: Referral, Home Medications, Allergies, Vital Signs/Notes, Consult, Exam, Lab/Radiology Notes, Impression, Plan,  Billing, Coding Description   Last Updated: 12-Oct-15 15:54 by Manfred Shirts (MD)

## 2014-05-07 NOTE — Consult Note (Signed)
General Aspect Anita Massey is a 34yo Caucasian female w/ PMHx s/f palpitations, obesity and HTN who was admitted to [facility] today for elevated BP, dizziness and EKG changes. Cardiology consulted for the latter.   She reports prior stress testing and cardiac monitor x 2 for palpitations arranged by Dr. Fletcher Anon several years ago which were "normal." Underwent echo at [facility] previously for murmur detected on exam which was "normal."   She is currently trying to get pregnant. No gestational HTN w/ prior pregnancies. She has been on labetalol 183m daily for BP control. She denies EtOH, tobacco or illicit drug use. No h/o thyroid abnormalities or sleep apnea. She awoke ~ 3 AM today to feed her son. She developed tachy-palpitations, nausea and lightheadedness. BP cuff reported SBP 201/100, 106 bpm. She took a labetalol 1042mand called EMS. BP on arrival 180/60s. She does report intermittent substernal chest tightness and dyspnea occuring w/ episodes of elevated BP. Cannot relate this to exertion. Denies syncope, PND, orthopnea, LE edema or sudden weight increase. [facility]   Present Illness There, EKG revealed NSR w/ asymmetic TWIs inferolaterally. Initial TnI WNL. CBC- WBC 11.5K. K 3.4, otherwise BMP largely WNL. Mg 1.7. U/a- 3+ blood, 10 RBC/hpf (last menstruation 1/12). CXR- no active process, incidental azygos lobe appreciated. Head CT- normal. Most recent BP 126/72 (prior 174/97, 144/91). She was admitted by the medicine service.   PAST MEDICAL HISTORY HTN Obesity Palpitations  PAST SURGICAL HISTORY Appendectomy D&C  ALLERGIES No known drug allergies  SOCIAL HISTORY Denies tobacco, EtOH or illicit drug use.   FAMILY HISTORY Mother with MVP Maternal grandfather w/ CAD, prior CABG Hypertension, DM2 runs in the family   Physical Exam:  GEN well nourished, no acute distress, obese   HEENT pink conjunctivae, PERRL, hearing intact to voice   NECK  supple  No masses  trachea midline  no JVD or bruits   RESP normal resp effort  clear BS  no use of accessory muscles  no wheezing, rales or rhonchi   CARD Regular rate and rhythm  Normal, S1, S2  Murmur  short, II/VI systolic murmur at RUSB   ABD denies tenderness  soft  normal BS   EXTR negative cyanosis/clubbing, negative edema   SKIN normal to palpation, skin turgor good   NEURO follows commands, motor/sensory function intact   PSYCH alert, A+O to time, place, person   Review of Systems:  Subjective/Chief Complaint dizziness, nausea; palpitations No syncope. NO melena, hematochezia or hematuria.   Cardiovascular: Tightness  Palpitations  Dyspnea   Review of Systems: All other systems were reviewed and found to be negative     anxiety:    palpitations:    tachycardia:    chronic back pain:    Appendectomy:   Home Medications: Medication Instructions Status  PNV Prenatal Plus Prenatal Multivitamins with Folic Acid 1 mg oral tablet 1 tab(s) orally once a day Active  folic acid 1 mg oral tablet 3 tab(s) orally once a day Active  labetalol 100 mg oral tablet 1 tab(s) orally once a day Active   Lab Results:  Thyroid:  28-Jan-15 04:13   Thyroid Stimulating Hormone 3.28 (0.45-4.50 (International Unit)  ----------------------- Pregnant patients have  different reference  ranges for TSH:  - - - - - - - - - -  Pregnant, first trimetser:  0.36 - 2.50 uIU/mL)  Routine Chem:  28-Jan-15 04:13   Hemoglobin A1c ([facility]) 5.5 (The American Diabetes Association  recommends that a primary goal of therapy should be <7% and that physicians should reevaluate the treatment regimen in patients with HbA1c values consistently >8%.)  Glucose, Serum  112  BUN 8  Creatinine (comp) 0.76  Sodium, Serum 138  Potassium, Serum  3.4  Chloride, Serum 106  CO2, Serum 25  Calcium (Total), Serum 9.1  Anion Gap 7  Osmolality (calc) 275  eGFR (African American) >60  eGFR (Non-African  American) >60 (eGFR values <48m/min/1.73 m2 may be an indication of chronic kidney disease (CKD). Calculated eGFR is useful in patients with stable renal function. The eGFR calculation will not be reliable in acutely ill patients when serum creatinine is changing rapidly. It is not useful in  patients on dialysis. The eGFR calculation may not be applicable to patients at the low and high extremes of body sizes, pregnant women, and vegetarians.)    04:18   Magnesium, Serum  1.7 (1.8-2.4 THERAPEUTIC RANGE: 4-7 mg/dL TOXIC: > 10 mg/dL  -----------------------)  Cardiac:  28-Jan-15 04:13   Troponin I < 0.02 (0.00-0.05 0.05 ng/mL or less: NEGATIVE  Repeat testing in 3-6 hrs  if clinically indicated. >0.05 ng/mL: POTENTIAL  MYOCARDIAL INJURY. Repeat  testing in 3-6 hrs if  clinically indicated. NOTE: An increase or decrease  of 30% or more on serial  testing suggests a  clinically important change)  Routine UA:  28-Jan-15 04:38   Color (UA) Straw  Clarity (UA) Clear  Glucose (UA) Negative  Bilirubin (UA) Negative  Ketones (UA) Negative  Specific Gravity (UA) 1.004  Blood (UA) 3+  pH (UA) 6.0  Protein (UA) Negative  Nitrite (UA) Negative  Leukocyte Esterase (UA) Negative (Result(s) reported on 10 Feb 2013 at 04:56AM.)  RBC (UA) 10 /HPF  WBC (UA) 1 /HPF  Bacteria (UA) 1+  Epithelial Cells (UA) 1 /HPF (Result(s) reported on 10 Feb 2013 at 04:56AM.)  Routine Hem:  28-Jan-15 04:13   WBC (CBC)  11.5  RBC (CBC) 4.32  Hemoglobin (CBC) 13.2  Hematocrit (CBC) 38.1  Platelet Count (CBC) 266 (Result(s) reported on 10 Feb 2013 at 04:24AM.)  MCV 88  MCH 30.5  MCHC 34.7  RDW 12.7   EKG:  Interpretation NSR, downsloping ST depressions w/ asymmetric appearing TWIs II, III, aVF, V5, V6, narrow Q waves II, III, aVF, biatrial enlargement, borderline LVH   Rate 97   EKG Comparision Changed from  12/2012 tracing   Radiology Results: XRay:    28-Jan-15 04:24, Chest PA and  Lateral  Chest PA and Lateral   REASON FOR EXAM:    chest heaviness, palpitations, SOB  COMMENTS:       PROCEDURE: DXR - DXR CHEST PA (OR AP) AND LATERAL  - Feb 10 2013  4:24AM     CLINICAL DATA:  Nausea and palpitations    EXAM:  CHEST  2 VIEW    COMPARISON:  None available.    FINDINGS:  The cardiac and mediastinal silhouettes are within normal limits.  The lungs are normally inflated. Incidental note made of an azygos  lobe. No airspace consolidation, pleural effusion, or pulmonary  edema is identified. There is no pneumothorax.    No acute osseous abnormality identified.     IMPRESSION:  No active cardiopulmonary disease.      Electronically Signed    By: BJeannine BogaM.D.    On: 02/10/2013 04:36       Verified By: BNeomia Glass M.D.,  CT:    28-Jan-15 07:27, CT Head Without Contrast  CT Head Without Contrast   REASON FOR EXAM:    headache and hypertension  COMMENTS:       PROCEDURE: CT  - CT HEAD WITHOUT CONTRAST  - Feb 10 2013  7:27AM     CLINICAL DATA:  Hypertension and dizziness.    EXAM:  CT HEAD WITHOUT CONTRAST    TECHNIQUE:  Contiguous axial images were obtained from the base of the skull  through the vertex without intravenous contrast.    COMPARISON:  None.  FINDINGS:  Ventricle size is normal. Negative for acute or chronic infarction.  Negative for hemorrhage or fluid collection. Negative formass or  edema. No shift of the midline structures.    Calvarium is intact.     IMPRESSION:  Normal      Electronically Signed    By: Franchot Gallo M.D.    On: 02/10/2013 07:34     Verified By: Truett Perna, M.D.,    No Known Allergies:    Impression 34yo Caucasian female w/ PMHx s/f palpitations, obesity and HTN who was admitted to [facility] today for elevated BP, dizziness and EKG changes. Cardiology consulted for the latter.   1. Hypertension w/ suspected hypertensive structural cardiac changes Suspect hypertension  contributing to symptoms of dizziness, intermittent chest tightness and dyspnea. BP at home 201/100. Improved on labetalol. She reports BP has recently become difficult to control over the past 6 months. Actively attempting to get pregnant. Objectively, EKG shows a pattern c/w LV strain over ishcemia. Borderline LVH, biatrial enlargement appreciated. Initial TnI WNL. Short, II/VI systolic murmer on exam, ? flow murmur.  -- Check bHCG for pregnancy status -- Increase labetalol to 139m BID -- Avoid harmful antihypertensives in pregnancy -- Check 2D echo -- Cycle cardiac enzymes for formal rule out +/- stress testing in- vs outpatient. Low risk at present.  -- Obtain TSH, Hgb A1C and lipid panel  2. Palpitations Tachy-palpitations noted this AM (rate 106 bpm). "Skipped beats" intermittently at home. No significant findings on prior cardiac monitoring per patient.  -- Monitor rhythm on telemetry (r/o atrial tachy-arrhythmias w/ possible atrial dilatation) -- Continue labetalol -- Replete Mg, K -- Check TSH, echo  3. Hematuria Noted on u/a. Last menstruation 1/12 per patient. No evidence of UTI.  -- Management per primary team  4. Hypomagnesemia 1.7.  -- Repleting currently  5. Hypokalemia 3.4  -- Replete  6. Obesity BMI 32.  -- Diet & exercise as a means of weight loss and, hence, RF reduction   Electronic Signatures for Addendum Section:  HLeonie Man(MD) (Signed Addendum 28-Jan-15 13:50)  I saw & examined the patient in the ER along with Mr. ASpero Geralds  I agree wtih his history, findings, examination & recommendations. Young woman with accelerated HTN & palpitations -- agree with R/o MI, Monitor & Echo. ? LVH pattern on ECG. Increase BB dose to BID & may need to increase actual dose as well. Futher recommendations based upon Echo/ Monitor.  If R/o MI - consider OP ST for CP eval.   Electronic Signatures: AMeriel Pica(PA-C)  (Signed 28-Jan-15 10:20)  Authored:  General Aspect/Present Illness, History and Physical Exam, Review of System, Home Medications, Labs, EKG , Radiology, Allergies, Impression/Plan HLeonie Man(MD)  (Signed 28-Jan-15 13:42)  Authored: History and Physical Exam, Review of System, Past Medical History, Labs  Co-Signer: General Aspect/Present Illness, History and Physical Exam, Review of System, Home Medications, Labs, EKG , Radiology, Allergies, Impression/Plan   Last Updated: 28-Jan-15  13:50 by Leonie Man (MD)

## 2014-05-15 NOTE — Op Note (Signed)
PATIENT NAME:  Anita Massey, NICCOLI MR#:  [id number] DATE OF BIRTH:  03/12/1980  DATE OF PROCEDURE:  03/13/2014  PREOPERATIVE DIAGNOSES:  1. Intrauterine pregnancy at 37 weeks 6 days gestational age.  2. Superimposed preeclampsia.  3. History of 4th-degree laceration with prior delivery.   POSTOPERATIVE DIAGNOSES: 1. Intrauterine pregnancy at 37 weeks 6 days gestational age.  2. Superimposed preeclampsia.  3. History of 4th-degree laceration with prior delivery.   PROCEDURE: Primary low transverse cesarean section via Pfannenstiel incision.   ANESTHESIA: General.   SURGEON: Will Bonnet, MD  ESTIMATED BLOOD LOSS: 1000 mL.   COMPLICATIONS: None.   FINDINGS:  1. Normal-appearing gravid uterus, fallopian tubes, and ovaries.  2. Viable female infant with Apgar scores of 8 at one minute and 9 at five minutes.   SPECIMENS: None.   CONDITION AT THE END OF THE PROCEDURE: Stable.   PROCEDURE IN DETAIL: The patient was taken to the operating room. After prepping and draping, general anesthesia was administered and found to be adequate. 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 The patient was counseled regarding this recommendation and agreed to the plan.   Once general anesthesia was administered, a Pfannenstiel incision was made with the scalpel and carried through the various layers until the peritoneum was identified and entered sharply. The peritoneal opening was extended and the bladder flap was created. A low transverse hysterotomy was made with a scalpel and extended laterally with craniocaudal tension. After rupture of the membranes for clear fluid returned, the head followed by the rest of the shoulders and the rest of body was delivered without difficulty. The cord was clamped and cut. Time from incision to clamping of the cord was just under 4 minutes. The  infant was handed to the pediatrician.   The placenta was removed spontaneously intact with a 3 vessel cord. The uterus was exteriorized, cleared of all clots and debris. The hysterotomy was closed using #0 Vicryl in a running locked fashion. A second layer of the same suture was used. Several figure-of-8 sutures had to be thrown to obtain hemostasis. The uterus was returned to the abdomen and the gutters were cleared of all clots and debris. The peritoneum was reapproximated using #0 Vicryl in a running fashion.   After inspection of the rectus abdominis muscles for hemostasis, the On-Q catheters were placed according to the manufacturer's recommendations. They were inserted approximately 4 cm cephalad to the incision line, approximately 1 cm apart straddling the midline. They were inserted to a depth of approximately the fourth marking on the catheters and positioned just superficial to the rectus abdominis muscles and just deep to the rectus fascia.   The fascia was closed using 1-0 looped PDS in a running fashion. Great care was taken not to include the On-Q catheters. Subcutaneous tissue was reapproximated using #2-0 plain gut such that no greater than 2 cm of dead space remained. The skin was closed using #4-0 Monocryl in a subcuticular fashion. The skin closure was reinforced using benzoin as well as Steri-Strips.   The On-Q catheters were affixed to the skin using Dermabond as well as Steri-Strips and Tegaderm. Each catheter was bolused with 5 mL of 0.5% Marcaine plain for a total of 10 mL of Marcaine plain. A pressure dressing was applied to the incision line at the end of the procedure.   The patient tolerated the procedure well. Sponge, lap, and  needle counts were correct x2. For VTE prophylaxis, the patient was wearing pneumatic compression stockings which were on and operating throughout the entire procedure. For antibiotic prophylaxis, she received 2 grams of Ancef prior to skin incision. The  patient was awakened in the operating room and taken to recovery in stable condition.    ____________________________ Will Bonnet, MD sdj:ah D: 03/13/2014 20:58:28 ET T: 03/14/2014 07:39:26 ET JOB#: [id number]  cc: Will Bonnet, MD, <Dictator> Will Bonnet MD ELECTRONICALLY SIGNED 03/31/2014 10:09

## 2014-05-24 NOTE — H&P (Signed)
L&D Evaluation:  History Expanded:  HPI 34 yo wf [id number] who is about [redacted] weeks pregnant and who had a fetal heart beat but when the betas were not going up again [place] was doen and no FHT was seen. she was goijg to do expecrant management but started hemorraghing lastr night nad came to the ER. she was passing clots and passed a sac US revealed thickened endometrium and retained placenta. she was started on pitocin but the placenta did not deliver on its own so we are going to the OR to remove it.   Gravida 3   Term 1   PreTerm 0   Abortion 2   Living 1   Blood Type (Maternal) A positive   Maternal HIV Unknown   Maternal Syphilis Ab Unknown   Maternal Varicella Unknown   Rubella Results (Maternal) unknown   Maternal T-Dap Unknown   Presents with abdominal pain, vaginal bleeding   Patient's Medical History No Chronic Illness   Patient's Surgical History D&C   Medications Pre Natal Vitamins   Allergies NKDA   Social History none   Family History Non-Contributory   Current Prenatal Course Notable For Bleeding   ROS:  General normal   HEENT normal   CNS normal   GI normal   GU abdominal cramping open cervix and tissue fro os   Resp normal   CV normal   Renal normal   MS normal   Exam:  Vital Signs stable   Urine Protein not completed   General other, cramping pain   Mental Status clear   Chest clear   Heart normal sinus rhythm   Abdomen gravid, tender with contractions   Back no CVAT   Edema no edema   Reflexes 1+   Pelvic no external lesions   Mebranes Ruptured   Skin dry   Lymph no lymphadenopathy   Impression:  Impression incomplete AB   Plan:  Plan UA   Follow Up Appointment need to schedule. in 2 weeks. needs to see dermatologist asap for abnl mole on her hand.   Electronic Signatures: Erik Obey (MD)  (Signed 24-Jul-14 11:09)  Authored: L&D Evaluation   Last Updated: 24-Jul-14 11:09 by Erik Obey (MD)

## 2014-05-24 NOTE — H&P (Signed)
L&D Evaluation:  History:  HPI -CC: uterine contractions -HPI: 34 y/o [id number] @ 37/4 (based on 6wk u/s) with above CC. 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 She has no h/o VTE  Preg called and told to come in for labor eval. Once on L&D, patient states UCs stopped and no other s/s of labor. Had HA today and threw up once. no visual changes, chest pain, sob, decreased FM.   Medications PNV, folic acid, zoloft, apap prn   Allergies latex   Social History none   Exam:  Vital Signs 153/89, 146/86, 142/83, 136/83. all other VS normal and stable   General no apparent distress   Mental Status clear   Chest clear   Heart normal sinus rhythm   Abdomen gravid, non-tender   Reflexes 2+  brachial   Pelvic FT per RN   Mebranes Intact   FHT 155 baseline, +accels, no decels, moderate variability   Ucx rare   Other PC 330, all other HELLP labs negative (Cr 0.73, Hct 37, plt 301)   Impression:  Impression ?superimposed pre-eclampsia on cHTN   Plan:  Comments *IUP: rNST, fetal status reassuring. continue with qday NSTs *cHTN: patient was on labetalol until 4 weeks ago and it was stopped then due to BPs being normal, with BPs being normal until today. within the last week, she has had some new onset 1+ proteinuria at those visits. Patient's dx isn't straight forward b/c she has a ?cHTN history due to being on metop for heart palps but also sounds like she was on it for possible HTN. Also, patient has never had baseline pre-eclampsia labs, so we dont know if she has baseline proteinuria, although her dips had been negative up until the last two weeks.  Given this, and her new onset mild range pressures, will do 24hr urine to get more accurate protein assessment but also in order to trend her BPs to see if they continue to elevated.  If so, and proteinuria persist, would move toward delivery for  super-imposed pre-eclampsia. HA gone after dose of apap. I told the patient that I'm leaning towards delivery but would like more information, given her history. -q4h VS. pt told to let us know any s/s of pre-eclampsia -2/23 @ 62/7: AFI 16, cephalic -0/35 @ 00/9: EFW 51%, 2920gm, normal AC and AFI 13 *Heme: s/p [facility] consult. they recommended possibly doing baby asa versus no anti-coagulation in AP and then lovenox postpartum.  Patient hasn't been on asa. *Delivery plan: d/w pt her history and she had 07/2010 SVD/4th degree of 2390gm infant with current child near that size. Risk of rpt 4th degree d/w pt and offered, when time for delivery, primary c-section for this purpose, with increased VTE risk with surgery.  Patient to consider options and let us know when moving towards delivery *EOL: negative. was 1cm in the office and no current s/s *Depression: continue zoloft. mood good *GBS neg *PPx: SCDs, OOB encouraged *FEN/GI: regular diet   Electronic Signatures: Aletha Halim (MD)  (Signed 26-Feb-16 23:08)  Authored: L&D Evaluation   Last Updated: 26-Feb-16 23:08 by Aletha Halim (MD)

## 2014-09-27 ENCOUNTER — Ambulatory Visit: Payer: No Typology Code available for payment source | Admitting: Internal Medicine

## 2014-10-31 ENCOUNTER — Emergency Department: Payer: No Typology Code available for payment source

## 2014-10-31 ENCOUNTER — Encounter: Payer: Self-pay | Admitting: Emergency Medicine

## 2014-10-31 ENCOUNTER — Emergency Department
Admission: EM | Admit: 2014-10-31 | Discharge: 2014-10-31 | Disposition: A | Discharge status: Home / Self Care | Payer: No Typology Code available for payment source | Attending: Emergency Medicine | Admitting: Emergency Medicine

## 2014-10-31 DIAGNOSIS — Z792 Long term (current) use of antibiotics: Secondary | ICD-10-CM | POA: Insufficient documentation

## 2014-10-31 DIAGNOSIS — Z79899 Other long term (current) drug therapy: Secondary | ICD-10-CM | POA: Diagnosis not present

## 2014-10-31 DIAGNOSIS — Z3202 Encounter for pregnancy test, result negative: Secondary | ICD-10-CM | POA: Insufficient documentation

## 2014-10-31 DIAGNOSIS — K8020 Calculus of gallbladder without cholecystitis without obstruction: Secondary | ICD-10-CM | POA: Insufficient documentation

## 2014-10-31 DIAGNOSIS — Z87891 Personal history of nicotine dependence: Secondary | ICD-10-CM | POA: Insufficient documentation

## 2014-10-31 DIAGNOSIS — R1011 Right upper quadrant pain: Secondary | ICD-10-CM

## 2014-10-31 DIAGNOSIS — I10 Essential (primary) hypertension: Secondary | ICD-10-CM | POA: Diagnosis not present

## 2014-10-31 LAB — URINALYSIS COMPLETE WITH MICROSCOPIC (ARMC ONLY)
BACTERIA UA: NONE SEEN
Bilirubin Urine: NEGATIVE
Glucose, UA: NEGATIVE mg/dL
Ketones, ur: NEGATIVE mg/dL
Leukocytes, UA: NEGATIVE
NITRITE: NEGATIVE
PH: 5 (ref 5.0–8.0)
PROTEIN: NEGATIVE mg/dL
SPECIFIC GRAVITY, URINE: 1.009 (ref 1.005–1.030)

## 2014-10-31 LAB — CBC
HEMATOCRIT: 43.2 % (ref 35.0–47.0)
HEMOGLOBIN: 14.3 g/dL (ref 12.0–16.0)
MCH: 28.1 pg (ref 26.0–34.0)
MCHC: 33 g/dL (ref 32.0–36.0)
MCV: 84.9 fL (ref 80.0–100.0)
Platelets: 330 10*3/uL (ref 150–440)
RBC: 5.09 MIL/uL (ref 3.80–5.20)
RDW: 13.1 % (ref 11.5–14.5)
WBC: 14.3 10*3/uL — ABNORMAL HIGH (ref 3.6–11.0)

## 2014-10-31 LAB — COMPREHENSIVE METABOLIC PANEL
ALBUMIN: 4.4 g/dL (ref 3.5–5.0)
ALT: 39 U/L (ref 14–54)
ANION GAP: 10 (ref 5–15)
AST: 39 U/L (ref 15–41)
Alkaline Phosphatase: 165 U/L — ABNORMAL HIGH (ref 38–126)
BUN: 13 mg/dL (ref 6–20)
CO2: 23 mmol/L (ref 22–32)
Calcium: 9.9 mg/dL (ref 8.9–10.3)
Chloride: 105 mmol/L (ref 101–111)
Creatinine, Ser: 0.67 mg/dL (ref 0.44–1.00)
GFR calc Af Amer: >60 mL/min (ref >60)
GFR calc non Af Amer: >60 mL/min (ref >60)
GLUCOSE: 85 mg/dL (ref 65–99)
POTASSIUM: 4.5 mmol/L (ref 3.5–5.1)
SODIUM: 138 mmol/L (ref 135–145)
Total Bilirubin: 1.4 mg/dL — ABNORMAL HIGH (ref 0.3–1.2)
Total Protein: 8 g/dL (ref 6.5–8.1)

## 2014-10-31 LAB — TSH: TSH: 1.522 u[IU]/mL (ref 0.350–4.500)

## 2014-10-31 LAB — LIPASE, BLOOD: LIPASE: 17 U/L — AB (ref 22–51)

## 2014-10-31 LAB — GLUCOSE, CAPILLARY: Glucose-Capillary: 94 mg/dL (ref 65–99)

## 2014-10-31 LAB — TROPONIN I: Troponin I: <0.03 ng/mL (ref <0.031)

## 2014-10-31 MED ORDER — ONDANSETRON HCL 4 MG/2ML IJ SOLN
4.0000 mg | Freq: Once | INTRAMUSCULAR | Status: AC
  Administered 2014-10-31: 4 mg via INTRAVENOUS
  Filled 2014-10-31: qty 2

## 2014-10-31 MED ORDER — KETOROLAC TROMETHAMINE 30 MG/ML IJ SOLN
30.0000 mg | Freq: Once | INTRAMUSCULAR | Status: AC
  Administered 2014-10-31: 30 mg via INTRAVENOUS
  Filled 2014-10-31: qty 1

## 2014-10-31 MED ORDER — OXYCODONE-ACETAMINOPHEN 5-325 MG PO TABS
1.0000 | ORAL_TABLET | Freq: Once | ORAL | Status: AC
Start: 2014-10-31 — End: 2014-10-31
  Administered 2014-10-31: 1 via ORAL

## 2014-10-31 MED ORDER — ONDANSETRON HCL 4 MG PO TABS: 4.0000 mg | ORAL_TABLET | Freq: Four times a day (QID) | ORAL | Status: DC | PRN

## 2014-10-31 MED ORDER — OXYCODONE-ACETAMINOPHEN 5-325 MG PO TABS
ORAL_TABLET | ORAL | Status: AC
  Administered 2014-10-31: 1 via ORAL
  Filled 2014-10-31: qty 1

## 2014-10-31 MED ORDER — HYDROCODONE-ACETAMINOPHEN 5-325 MG PO TABS: 1.0000 | ORAL_TABLET | Freq: Three times a day (TID) | ORAL | Status: DC | PRN

## 2014-10-31 NOTE — ED Notes (Signed)
Pt presents with cough and painful right ribs when coughing. Pt states is coughing up green sputum.

## 2014-10-31 NOTE — ED Notes (Signed)
Pt provided with lemon-lime soda, graham crackers, and peanut butter for PO challenge per verbal order from [place], [place].

## 2014-10-31 NOTE — ED Notes (Signed)
Pt states toradol did not relieve pain and would like more pain medications, Jenise, PA, notified. New orders given, see MAR.

## 2014-10-31 NOTE — ED Notes (Signed)

## 2014-10-31 NOTE — ED Notes (Signed)
Pt challenge PO challenge, denies nausea.

## 2014-10-31 NOTE — Discharge Instructions (Signed)
Biliary Colic Biliary colic is a pain in the upper abdomen. The pain:  Is usually felt on the right side of the abdomen, but it may also be felt in the center of the abdomen, just below the breastbone (sternum).  May spread back toward the right shoulder blade.  May be steady or irregular.  May be accompanied by nausea and vomiting. Most of the time, the pain goes away in 1-5 hours. After the most intense pain passes, the abdomen may continue to ache mildly for about 24 hours. Biliary colic is caused by a blockage in the bile duct. The bile duct is a pathway that carries bile--a liquid that helps to digest fats--from the gallbladder to the small intestine. Biliary colic usually occurs after eating, when the digestive system demands bile. The pain develops when muscle cells contract forcefully to try to move the blockage so that bile can get by. HOME CARE INSTRUCTIONS  Take medicines only as directed by your health care provider.  Drink enough fluid to keep your urine clear or pale yellow.  Avoid fatty, greasy, and fried foods. These kinds of foods increase your body's demand for bile.  Avoid any foods that make your pain worse.  Avoid overeating.  Avoid having a large meal after fasting. SEEK MEDICAL CARE IF:  You develop a fever.  Your pain gets worse.  You vomit.  You develop nausea that prevents you from eating and drinking. SEEK IMMEDIATE MEDICAL CARE IF:  You suddenly develop a fever and shaking chills.  You develop a yellowish discoloration (jaundice) of:  Skin.  Whites of the eyes.  Mucous membranes.  You have continuous or severe pain that is not relieved with medicines.  You have nausea and vomiting that is not relieved with medicines.  You develop dizziness or you faint.   This information is not intended to replace advice given to you by your health care provider. Make sure you discuss any questions you have with your health care provider.   Document  Released: 06/03/2005 Document Revised: 05/17/2014 Document Reviewed: 10/12/2013 Elsevier Interactive Patient Education 2016 [employer]  Cholecystitis Cholecystitis is inflammation of the gallbladder. It is often called a gallbladder attack. The gallbladder is a pear-shaped organ that lies beneath the liver on the right side of the body. The gallbladder stores bile, which is a fluid that helps the body to digest fats. If bile builds up in your gallbladder, your gallbladder becomes inflamed. This condition may occur suddenly (be acute). Repeat episodes of acute cholecystitis or prolonged episodes may lead to a long-term (chronic) condition. Cholecystitis is serious and it requires treatment.  CAUSES The most common cause of this condition is gallstones. Gallstones can block the tube (duct) that carries bile out of your gallbladder. This causes bile to build up. Other causes of this condition include:  Damage to the gallbladder due to a decrease in blood flow.  Infections in the bile ducts.  Scars or kinks in the bile ducts.  Tumors in the liver, pancreas, or gallbladder. RISK FACTORS This condition is more likely to develop in:  People who have sickle cell disease.  People who take birth control pills or use estrogen.  People who have alcoholic liver disease.  People who have liver cirrhosis.  People who have their nutrition delivered through a vein (parenteral nutrition).  People who do not eat or drink (do fasting) for a long period of time.  People who are obese.  People who have rapid weight loss.  People who are pregnant.  People who have increased triglyceride levels.  People who have pancreatitis. SYMPTOMS Symptoms of this condition include:  Abdominal pain, especially in the upper right area of the abdomen.  Abdominal tenderness or bloating.  Nausea.  Vomiting.  Fever.  Chills.  Yellowing of the skin and the whites of the eyes  (jaundice). DIAGNOSIS This condition is diagnosed with a medical history and physical exam. You may also have other tests, including:  Imaging tests, such as:  An ultrasound of the gallbladder.  A CT scan of the abdomen.  A gallbladder nuclear scan (HIDA scan). This scan allows your health care provider to see the bile moving from your liver to your gallbladder and to your small intestine.  MRI.  Blood tests, such as:  A complete blood count, because the white blood cell count may be higher than normal.  Liver function tests, because some levels may be higher than normal with certain types of gallstones. TREATMENT Treatment may include:  Fasting for a certain amount of time.  IV fluids.  Medicine to treat pain or vomiting.  Antibiotic medicine.  Surgery to remove your gallbladder (cholecystectomy). This may happen immediately or at a later time. [place] care will depend on your treatment. In general:  Take over-the-counter and prescription medicines only as told by your health care provider.  If you were prescribed an antibiotic medicine, take it as told by your health care provider. Do not stop taking the antibiotic even if you start to feel better.  Follow instructions from your health care provider about what to eat or drink. When you are allowed to eat, avoid eating or drinking anything that triggers your symptoms.  Keep all follow-up visits as told by your health care provider. This is important. SEEK MEDICAL CARE IF:  Your pain is not controlled with medicine.  You have a fever. SEEK IMMEDIATE MEDICAL CARE IF:  Your pain moves to another part of your abdomen or to your back.  You continue to have symptoms or you develop new symptoms even with treatment.   This information is not intended to replace advice given to you by your health care provider. Make sure you discuss any questions you have with your health care provider.   Document  Released: 12/31/2004 Document Revised: 09/21/2014 Document Reviewed: 04/13/2014 Elsevier Interactive Patient Education 2016 [place] the prescription meds for nausea and pain as needed. Follow-up with Dr. Rayann Heman for ongoing symptoms. Start with clear liquids then progress to the low fat diet as attached.

## 2014-10-31 NOTE — ED Provider Notes (Signed)
[facility] Provider Note ____________________________________________  Time seen: 1515  I have reviewed the triage vital signs and the nursing notes.  HISTORY  Chief Complaint  Chest Pain  HPI KYNSLEIGH WESTENDORF is a 34 y.o. female   reports to the ED for evaluation of sudden onset of right upper quadrant pain last night. She describes that onset was shortly after eating, but she does note that her appetite has been decreased. She reports pain that is worse with deep breaths. The pain seems to radiate to her back between her shoulder blades at times. He is also noted intermittent productive cough over the last week. She denies any vomiting but does endorse some nausea intermittently with symptom onset.  Past Medical History  Diagnosis Date  . Hypertension   . Dysuria   . Anxiety state, unspecified   . Unspecified essential hypertension   . Rosacea   . Contact dermatitis and other eczema, due to unspecified cause   . Undiagnosed cardiac murmurs   . Palpitations   . Other psoriasis   . Obesity   . Heart palpitations   . Syncope and collapse   . Clotting disorder [facility])     Patient Active Problem List   Diagnosis Date Noted  . Essential hypertension, malignant 03/01/2013    Past Surgical History  Procedure Laterality Date  . Appendectomy    . Dilation and curettage of uterus      Current Outpatient Rx  Name  Route  Sig  Dispense  Refill  . clonazePAM (KLONOPIN) 0.5 MG tablet   Oral   Take 0.5 mg by mouth 2 (two) times daily as needed for anxiety.         Marland Kitchen doxycycline (VIBRAMYCIN) 100 MG capsule   Oral   Take 100 mg by mouth daily.         . folic acid (FOLVITE) 1 MG tablet   Oral   Take 3 mg by mouth daily.         Marland Kitchen HYDROcodone-acetaminophen (NORCO) 5-325 MG tablet   Oral   Take 1 tablet by mouth every 8 (eight) hours as needed for moderate pain.   15 tablet   0   . labetalol (NORMODYNE) 100 MG tablet  Oral   Take 100 mg by mouth 2 (two) times daily.         . metroNIDAZOLE (METROCREAM) 0.75 % cream   Topical   Apply 1 application topically 2 (two) times daily.         . ondansetron (ZOFRAN) 4 MG tablet   Oral   Take 1 tablet (4 mg total) by mouth every 6 (six) hours as needed for nausea or vomiting.   15 tablet   0   . Prenatal Vit-Fe Fumarate-FA (PRENATAL MULTIVITAMIN) TABS tablet   Oral   Take 1 tablet by mouth daily at 12 noon.         . promethazine (PHENERGAN) 25 MG tablet   Oral   Take 12.5-25 mg by mouth every 6 (six) hours as needed for nausea or vomiting.         . sertraline (ZOLOFT) 50 MG tablet   Oral   Take 50 mg by mouth daily.           Allergies Review of patient's allergies indicates no known allergies.  Family History  Problem Relation Age of Onset  . Mitral valve prolapse Mother   . Arrhythmia Mother   . Hypertension Mother   .  Heart disease Maternal Grandfather   . Lung cancer Maternal Grandfather     Social History Social History  Substance Use Topics  . Smoking status: Former Smoker -- 0.25 packs/day for 1 years  . Smokeless tobacco: None  . Alcohol Use: No   Review of Systems  Constitutional: Negative for fever. Eyes: Negative for visual changes. ENT: Negative for sore throat. Cardiovascular: Negative for chest pain. Respiratory: Negative for shortness of breath. Reports productive cough.  Gastrointestinal: Positive for RUQ abdominal pain and nausea. Denies vomiting and diarrhea. Genitourinary: Negative for dysuria. Musculoskeletal: Negative for back pain. Skin: Negative for rash. Neurological: Negative for headaches, focal weakness or numbness. ____________________________________________  PHYSICAL EXAM:  VITAL SIGNS: ED Triage Vitals  Enc Vitals Group     BP 10/31/14 1336 141/70 mmHg     Pulse Rate 10/31/14 1336 84     Resp 10/31/14 1336 20     Temp 10/31/14 1336 97.9 F (36.6 C)     Temp Source 10/31/14 1336  Oral     SpO2 10/31/14 1336 99 %     Weight 10/31/14 1336 195 lb (88.451 kg)     Height 10/31/14 1336 5\' 1"  (1.549 m)     Head Cir --      Peak Flow --      Pain Score 10/31/14 1335 8     Pain Loc --      Pain Edu? --      Excl. in [place]? --    Constitutional: Alert and oriented. Well appearing and in no distress. Head: Normocephalic and atraumatic.      Eyes: Conjunctivae are normal. PERRL. Normal extraocular movements      Ears: Canals clear. TMs intact bilaterally.   Nose: No congestion/rhinorrhea.   Mouth/Throat: Mucous membranes are moist.   Neck: Supple. No thyromegaly. Hematological/Lymphatic/Immunological: No cervical lymphadenopathy. Cardiovascular: Normal rate, regular rhythm.  Respiratory: Normal respiratory effort. No wheezes/rales/rhonchi. Gastrointestinal: Soft but tender to palp over the RUQ. + Murphy's sign. No distention, rebound, guarding, or organomegaly. Musculoskeletal: Nontender with normal range of motion in all extremities.  Neurologic:  Normal gait without ataxia. Normal speech and language. No gross focal neurologic deficits are appreciated. Skin:  Skin is warm, dry and intact. No rash noted. Psychiatric: Mood and affect are normal. Patient exhibits appropriate insight and judgment. ____________________________________________   LABS (pertinent positives/negatives) Labs Reviewed  LIPASE, BLOOD - Abnormal; Notable for the following:    Lipase 17 (*)    All other components within normal limits  COMPREHENSIVE METABOLIC PANEL - Abnormal; Notable for the following:    Alkaline Phosphatase 165 (*)    Total Bilirubin 1.4 (*)    All other components within normal limits  CBC - Abnormal; Notable for the following:    WBC 14.3 (*)    All other components within normal limits  URINALYSIS COMPLETEWITH MICROSCOPIC ([facility] ONLY) - Abnormal; Notable for the following:    Color, Urine STRAW (*)    APPearance CLEAR (*)    Hgb urine dipstick 3+ (*)     Squamous Epithelial / LPF 0-5 (*)    All other components within normal limits  GLUCOSE, CAPILLARY  TROPONIN I  TSH  POC URINE PREG, ED  ____________________________________________  EKG EKG: normal EKG, normal sinus rhythm, unchanged from previous tracings. ____________________________________________   RADIOLOGY Abd [place] - Limited  IMPRESSION: Normal right upper quadrant sonogram.  CXR  IMPRESSION: No active cardiopulmonary disease.  I, Danyale Ridinger, Dannielle Karvonen, personally viewed and evaluated these images (  plain radiographs) as part of my medical decision making.  ____________________________________________  PROCEDURES  Zofran 4 mg IVP x 2 Toradol 30 mg IVP x 2 Roxicet 5-325 mg PO ____________________________________________  INITIAL IMPRESSION / ASSESSMENT AND PLAN / ED COURSE  Patient with what appears to be a acute gallbladder colic. Her clinical exam with positive Murphy sign, right upper quadrant pain, and anorexia is a clinical indication of cholecystitis. Her normal abdominal ultrasound, without indication of gallstones, is reassuring. Patient is also responded well to pain medicines, nausea medicine, and has tolerated by mouth challenge without nausea, vomiting or increased pain. Plan to follow-up with GI medicine, is agreeable to the patient at this time. She'll be discharged home with prescription for Zofran, and Vicodin will be provided. Patient is encouraged to start with a clear liquid diet, and progress slowly to a low-fat diet to prevent further biliary colic. She'll follow up with GI as discussed. ____________________________________________  FINAL CLINICAL IMPRESSION(S) / ED DIAGNOSES  Final diagnoses:  Abdominal pain, RUQ (right upper quadrant)  Gallbladder colic      Melvenia Needles, PA-C 10/31/14 2242  Lavonia Drafts, MD 11/02/14 1521

## 2014-11-02 LAB — POCT PREGNANCY, URINE: Preg Test, Ur: NEGATIVE

## 2014-11-07 ENCOUNTER — Other Ambulatory Visit: Payer: Self-pay | Admitting: Student

## 2014-11-07 DIAGNOSIS — R1011 Right upper quadrant pain: Secondary | ICD-10-CM

## 2014-11-15 ENCOUNTER — Ambulatory Visit
Admission: RE | Admit: 2014-11-15 | Discharge: 2014-11-15 | Disposition: A | Discharge status: Home / Self Care | Payer: No Typology Code available for payment source | Source: Ambulatory Visit | Attending: Student | Admitting: Student

## 2014-11-15 DIAGNOSIS — R1011 Right upper quadrant pain: Secondary | ICD-10-CM | POA: Diagnosis not present

## 2014-11-15 MED ORDER — TECHNETIUM TC 99M-LABELED RED BLOOD CELLS IV KIT: 20.0000 | PACK | Freq: Once | INTRAVENOUS | Status: DC | PRN

## 2014-11-15 MED ORDER — SINCALIDE 5 MCG IJ SOLR
0.0200 ug/kg | Freq: Once | INTRAMUSCULAR | Status: AC
  Administered 2014-11-15: 1.77 ug via INTRAVENOUS

## 2014-11-15 MED ORDER — TECHNETIUM TC 99M MEBROFENIN IV KIT
5.0000 | PACK | Freq: Once | INTRAVENOUS | Status: DC | PRN
  Administered 2014-11-15: 5.17 via INTRAVENOUS

## 2014-12-21 ENCOUNTER — Other Ambulatory Visit: Payer: Self-pay | Admitting: Student

## 2014-12-21 DIAGNOSIS — M545 Low back pain: Secondary | ICD-10-CM

## 2014-12-21 DIAGNOSIS — R8290 Unspecified abnormal findings in urine: Secondary | ICD-10-CM

## 2015-01-05 ENCOUNTER — Ambulatory Visit: Payer: No Typology Code available for payment source | Attending: Student

## 2015-01-19 ENCOUNTER — Telehealth: Payer: Self-pay

## 2015-01-19 NOTE — Telephone Encounter (Signed)
Received records request [facility] for [facility], forwarded to [facility] for processing.

## 2015-08-15 DIAGNOSIS — L719 Rosacea, unspecified: Secondary | ICD-10-CM | POA: Insufficient documentation

## 2015-09-04 DIAGNOSIS — R768 Other specified abnormal immunological findings in serum: Secondary | ICD-10-CM | POA: Insufficient documentation

## 2015-09-04 DIAGNOSIS — M2550 Pain in unspecified joint: Secondary | ICD-10-CM

## 2015-09-04 HISTORY — DX: Pain in unspecified joint: M25.50

## 2015-10-03 ENCOUNTER — Ambulatory Visit: Payer: Self-pay

## 2015-10-03 ENCOUNTER — Ambulatory Visit: Payer: Self-pay | Admitting: Urology

## 2015-10-11 ENCOUNTER — Ambulatory Visit: Payer: Self-pay

## 2016-01-12 ENCOUNTER — Emergency Department
Admission: EM | Admit: 2016-01-12 | Discharge: 2016-01-12 | Disposition: A | Discharge status: Home / Self Care | Payer: BLUE CROSS/BLUE SHIELD | Attending: Emergency Medicine | Admitting: Emergency Medicine

## 2016-01-12 ENCOUNTER — Encounter: Payer: Self-pay | Admitting: *Deleted

## 2016-01-12 DIAGNOSIS — R112 Nausea with vomiting, unspecified: Secondary | ICD-10-CM | POA: Diagnosis not present

## 2016-01-12 DIAGNOSIS — I10 Essential (primary) hypertension: Secondary | ICD-10-CM | POA: Insufficient documentation

## 2016-01-12 DIAGNOSIS — Z87891 Personal history of nicotine dependence: Secondary | ICD-10-CM | POA: Diagnosis not present

## 2016-01-12 DIAGNOSIS — R197 Diarrhea, unspecified: Secondary | ICD-10-CM | POA: Insufficient documentation

## 2016-01-12 DIAGNOSIS — R42 Dizziness and giddiness: Secondary | ICD-10-CM | POA: Diagnosis not present

## 2016-01-12 DIAGNOSIS — Z79899 Other long term (current) drug therapy: Secondary | ICD-10-CM | POA: Diagnosis not present

## 2016-01-12 LAB — COMPREHENSIVE METABOLIC PANEL
ALT: 27 U/L (ref 14–54)
AST: 27 U/L (ref 15–41)
Albumin: 4.4 g/dL (ref 3.5–5.0)
Alkaline Phosphatase: 123 U/L (ref 38–126)
Anion gap: 9 (ref 5–15)
BILIRUBIN TOTAL: 0.8 mg/dL (ref 0.3–1.2)
BUN: 16 mg/dL (ref 6–20)
CALCIUM: 9.3 mg/dL (ref 8.9–10.3)
CO2: 22 mmol/L (ref 22–32)
CREATININE: 0.73 mg/dL (ref 0.44–1.00)
Chloride: 107 mmol/L (ref 101–111)
GFR calc Af Amer: >60 mL/min (ref >60)
Glucose, Bld: 138 mg/dL — ABNORMAL HIGH (ref 65–99)
POTASSIUM: 4 mmol/L (ref 3.5–5.1)
Sodium: 138 mmol/L (ref 135–145)
TOTAL PROTEIN: 7.9 g/dL (ref 6.5–8.1)

## 2016-01-12 LAB — URINALYSIS, COMPLETE (UACMP) WITH MICROSCOPIC
Bacteria, UA: NONE SEEN
Bilirubin Urine: NEGATIVE
GLUCOSE, UA: NEGATIVE mg/dL
Ketones, ur: 5 mg/dL — AB
LEUKOCYTES UA: NEGATIVE
NITRITE: NEGATIVE
Protein, ur: 30 mg/dL — AB
SPECIFIC GRAVITY, URINE: 1.021 (ref 1.005–1.030)
pH: 5 (ref 5.0–8.0)

## 2016-01-12 LAB — INFLUENZA PANEL BY PCR (TYPE A & B)
INFLAPCR: NEGATIVE
Influenza B By PCR: NEGATIVE

## 2016-01-12 LAB — CBC
HCT: 42.5 % (ref 35.0–47.0)
Hemoglobin: 14.4 g/dL (ref 12.0–16.0)
MCH: 28.8 pg (ref 26.0–34.0)
MCHC: 34 g/dL (ref 32.0–36.0)
MCV: 84.7 fL (ref 80.0–100.0)
PLATELETS: 317 10*3/uL (ref 150–440)
RBC: 5.02 MIL/uL (ref 3.80–5.20)
RDW: 13.5 % (ref 11.5–14.5)
WBC: 17.7 10*3/uL — AB (ref 3.6–11.0)

## 2016-01-12 LAB — LIPASE, BLOOD: Lipase: 13 U/L (ref 11–51)

## 2016-01-12 MED ORDER — PROMETHAZINE HCL 25 MG/ML IJ SOLN
6.2500 mg | Freq: Once | INTRAMUSCULAR | Status: AC
  Administered 2016-01-12: 6.25 mg via INTRAVENOUS
  Filled 2016-01-12: qty 1

## 2016-01-12 MED ORDER — SODIUM CHLORIDE 0.9 % IV BOLUS (SEPSIS)
1000.0000 mL | Freq: Once | INTRAVENOUS | Status: AC
  Administered 2016-01-12: 1000 mL via INTRAVENOUS

## 2016-01-12 MED ORDER — PROMETHAZINE HCL 12.5 MG RE SUPP: 12.5000 mg | Freq: Four times a day (QID) | RECTAL | 0 refills | Status: DC | PRN

## 2016-01-12 MED ORDER — KETOROLAC TROMETHAMINE 30 MG/ML IJ SOLN
30.0000 mg | Freq: Once | INTRAMUSCULAR | Status: AC
  Administered 2016-01-12: 30 mg via INTRAVENOUS

## 2016-01-12 MED ORDER — PROMETHAZINE HCL 25 MG/ML IJ SOLN
25.0000 mg | Freq: Once | INTRAMUSCULAR | Status: AC
  Administered 2016-01-12: 25 mg via INTRAVENOUS

## 2016-01-12 MED ORDER — PROMETHAZINE HCL 12.5 MG PO TABS: 12.5000 mg | ORAL_TABLET | Freq: Four times a day (QID) | ORAL | 0 refills | Status: DC | PRN

## 2016-01-12 MED ORDER — ONDANSETRON HCL 4 MG/2ML IJ SOLN
4.0000 mg | INTRAMUSCULAR | Status: AC
  Administered 2016-01-12: 4 mg via INTRAVENOUS

## 2016-01-12 MED ORDER — PROMETHAZINE HCL 25 MG/ML IJ SOLN: 6.2500 mg | Freq: Once | INTRAMUSCULAR | Status: DC

## 2016-01-12 MED ORDER — ONDANSETRON HCL 4 MG/2ML IJ SOLN
4.0000 mg | Freq: Once | INTRAMUSCULAR | Status: DC | PRN
  Filled 2016-01-12: qty 2

## 2016-01-12 MED ORDER — ONDANSETRON HCL 4 MG/2ML IJ SOLN
4.0000 mg | Freq: Once | INTRAMUSCULAR | Status: AC
  Administered 2016-01-12: 4 mg via INTRAVENOUS

## 2016-01-12 MED ORDER — ONDANSETRON HCL 4 MG/2ML IJ SOLN
INTRAMUSCULAR | Status: AC
  Filled 2016-01-12: qty 2

## 2016-01-12 MED ORDER — KETOROLAC TROMETHAMINE 30 MG/ML IJ SOLN
INTRAMUSCULAR | Status: AC
  Administered 2016-01-12: 30 mg via INTRAVENOUS
  Filled 2016-01-12: qty 1

## 2016-01-12 MED ORDER — MORPHINE SULFATE (PF) 4 MG/ML IV SOLN
4.0000 mg | Freq: Once | INTRAVENOUS | Status: AC
  Administered 2016-01-12: 4 mg via INTRAVENOUS
  Filled 2016-01-12: qty 1

## 2016-01-12 MED ORDER — PROMETHAZINE HCL 25 MG/ML IJ SOLN
INTRAMUSCULAR | Status: AC
  Filled 2016-01-12: qty 1

## 2016-01-12 MED ORDER — ACETAMINOPHEN 500 MG PO TABS
1000.0000 mg | ORAL_TABLET | Freq: Once | ORAL | Status: AC
  Administered 2016-01-12: 1000 mg via ORAL
  Filled 2016-01-12: qty 2

## 2016-01-12 NOTE — ED Notes (Signed)
MD at bedside. 

## 2016-01-12 NOTE — ED Notes (Signed)
Pt called husband to pick her up from ED. Husband reports to be on him way to ED.

## 2016-01-12 NOTE — Discharge Instructions (Signed)
Please advance her diet as tolerated. Please drink plenty of fluids especially when feeling less nauseated. Return emergency Department for high fever, bloody diarrhea, focal abdominal pain, or any other new concerns.  Please return immediately if condition worsens. Please contact her primary physician or the physician you were given for referral. If you have any specialist physicians involved in her treatment and plan please also contact them. Thank you for using [place] [facility] emergency Department.

## 2016-01-12 NOTE — ED Provider Notes (Signed)
Time Seen: Approximately 1220  I have reviewed the triage notes  Chief Complaint: Emesis and Tachycardia   History of Present Illness: Anita Massey is a 35 y.o. female who presents with acute onset of nausea, vomiting, and diarrhea that started last evening. The patient states she first started with diarrhea and then the nausea and vomiting started later this morning. Patient denies any hematemesis or biliary emesis. She states her reason why she called EMS is because she felt lightheaded. Patient denies any chest pain to this historian. She denies any significant abdominal pain. She does still feel nauseated. She denies any exposure recent antibiotics or any recent travel. She describes multiple family members at home that have similar symptoms.  Past Medical History:  Diagnosis Date  . Anxiety state, unspecified   . Clotting disorder ([place])   . Contact dermatitis and other eczema, due to unspecified cause   . Dysuria   . Heart palpitations   . Hypertension   . Obesity   . Other psoriasis   . Palpitations   . Rosacea   . Syncope and collapse   . Undiagnosed cardiac murmurs   . Unspecified essential hypertension     Patient Active Problem List   Diagnosis Date Noted  . Essential hypertension, malignant 03/01/2013    Past Surgical History:  Procedure Laterality Date  . APPENDECTOMY    . DILATION AND CURETTAGE OF UTERUS      Past Surgical History:  Procedure Laterality Date  . APPENDECTOMY    . DILATION AND CURETTAGE OF UTERUS      Current Outpatient Rx  . Order #: [id number] Class: Historical Med  . Order #: [id number] Class: Historical Med  . Order #: [id number] Class: Historical Med  . Order #: [id number] Class: Print  . Order #: [id number] Class: Historical Med  . Order #: [id number] Class: Historical Med  . Order #: [id number] Class: Print  . Order #: [id number] Class: Historical Med  . Order #: [id number] Class: Historical Med  . Order #: [id number] Class: Historical Med     Allergies:  Biaxin [clarithromycin]  Family History: Family History  Problem Relation Age of Onset  . Mitral valve prolapse Mother   . Arrhythmia Mother   . Hypertension Mother   . Heart disease Maternal Grandfather   . Lung cancer Maternal Grandfather     Social History: Social History  Substance Use Topics  . Smoking status: Former Smoker    Packs/day: 0.25    Years: 1.00  . Smokeless tobacco: Never Used  . Alcohol use No     Review of Systems:   10 point review of systems was performed and was otherwise negative:  Constitutional: No fever Eyes: No visual disturbances ENT: No sore throat, ear pain Cardiac: No chest pain Respiratory: No shortness of breath, wheezing, or stridor Abdomen: No abdominal pain, no vomiting, No diarrhea Endocrine: No weight loss, No night sweats Extremities: No peripheral edema, cyanosis Skin: No rashes, easy bruising Neurologic: No focal weakness, trouble with speech or swollowing Urologic: No dysuria, Hematuria, or urinary frequency A states there is a slight risk that she is pregnant  Physical Exam:  ED Triage Vitals  Enc Vitals Group     BP 01/12/16 1210 (!) 141/87     Pulse Rate 01/12/16 1210 (!) 131     Resp 01/12/16 1210 19     Temp 01/12/16 1210 99.4 F (37.4 C)     Temp Source 01/12/16 1210 Oral     SpO2 01/12/16 1210 99 %  Weight 01/12/16 1211 185 lb (83.9 kg)     Height 01/12/16 1211 5\' 2"  (1.575 m)     Head Circumference --      Peak Flow --      Pain Score 01/12/16 1211 0     Pain Loc --      Pain Edu? --      Excl. in [place]? --     General: Awake , Alert , and Oriented times 3; GCS 15 Head: Normal cephalic , atraumatic Eyes: Pupils equal , round, reactive to light Nose/Throat: No nasal drainage, patent upper airway without erythema or exudate.Dry mucous membranes  Neck: Supple, Full range of motion, No anterior adenopathy or palpable thyroid masses Lungs: Clear to ascultation without wheezes , rhonchi,  or rales Heart: Regular rate, regular rhythm without murmurs , gallops , or rubs Abdomen: Soft, non tender without rebound, guarding , or rigidity; bowel sounds positive and symmetric in all 4 quadrants. No organomegaly .        Extremities: 2 plus symmetric pulses. No edema, clubbing or cyanosis Neurologic: normal ambulation, Motor symmetric without deficits, sensory intact Skin: warm, dry, no rashes   Labs:   All laboratory work was reviewed including any pertinent negatives or positives listed below:  Labs Reviewed  COMPREHENSIVE METABOLIC PANEL - Abnormal; Notable for the following:       Result Value   Glucose, Bld 138 (*)    All other components within normal limits  CBC - Abnormal; Notable for the following:    WBC 17.7 (*)    All other components within normal limits  LIPASE, BLOOD  URINALYSIS, COMPLETE (UACMP) WITH MICROSCOPIC  INFLUENZA PANEL BY PCR (TYPE A & B, H1N1)    EKG: * ED ECG REPORT I, Daymon Larsen, the attending physician, personally viewed and interpreted this ECG.  Date: 01/12/2016 EKG Time: 1205 Rate: *144 Rhythm: Sinus tachycardia QRS Axis: normal Intervals: normal ST/T Wave abnormalities: normal Conduction Disturbances: none Narrative Interpretation: unremarkable No acute ischemic changes   ED Course:  Patient was started on anti-medic therapy which started with Zofran with some improvement in her nausea started to return and she was given IV Phenergan 6.25. Given her history and review of systems initially with family members at home with similar illness this all pointed toward viral gastroenteritis. Patient's renal functions normal though she appears to be very tachycardic and was started on IV fluid resuscitation. She has no focal abdominal pain on exam and I felt CAT scan evaluation was not necessary at this time Clinical Course      Assessment:  Nausea vomiting and diarrhea Vital gastroenteritis      Plan:  Plan will be to  observe the patient status post 1 L of fluid to get her heart rate decreased. She has been written up for discharge and she needs to be able to tolerate oral fluids. " New Prescriptions   PROMETHAZINE (PHENERGAN) 12.5 MG SUPPOSITORY    Place 1 suppository (12.5 mg total) rectally every 6 (six) hours as needed for nausea or vomiting.   PROMETHAZINE (PHENERGAN) 12.5 MG TABLET    Take 1 tablet (12.5 mg total) by mouth every 6 (six) hours as needed for nausea or vomiting.  " Patient was advised to return immediately if condition worsens. Patient was advised to follow up with their primary care physician or other specialized physicians involved in their outpatient care. The patient and/or family member/power of attorney had laboratory results reviewed at the bedside. All questions  and concerns were addressed and appropriate discharge instructions were distributed by the nursing staff.             Daymon Larsen, MD 01/12/16 [phone]

## 2016-01-12 NOTE — ED Notes (Signed)
Pt able to stand and ambulate to bathroom. Pt verbalized feeling weak and lightheaded. Pt flushed at this time.

## 2016-01-12 NOTE — ED Triage Notes (Signed)
Pt arrived to ED from home reporting NVD beginning this morning. Pts children and husband have same symptoms. Pt presents with chest wall pain that increases with palpation and a HR of 120 per EMS. Pt did not have flu shot this year and reports no family members have been tested for flu since symptoms began. Pt denies knowing if she has had fevers at home today. Pt alert and oriented. Vomiting upon arrival to ED.

## 2016-01-12 NOTE — ED Notes (Signed)
Pt verbalized having body aches and requested tylenol to relieve discomfort. MD aware.

## 2016-02-16 ENCOUNTER — Emergency Department: Payer: BLUE CROSS/BLUE SHIELD

## 2016-02-16 ENCOUNTER — Encounter: Payer: Self-pay | Admitting: Emergency Medicine

## 2016-02-16 ENCOUNTER — Emergency Department
Admission: EM | Admit: 2016-02-16 | Discharge: 2016-02-17 | Disposition: A | Discharge status: Home / Self Care | Payer: BLUE CROSS/BLUE SHIELD | Attending: Emergency Medicine | Admitting: Emergency Medicine

## 2016-02-16 DIAGNOSIS — I10 Essential (primary) hypertension: Secondary | ICD-10-CM | POA: Diagnosis not present

## 2016-02-16 DIAGNOSIS — K8020 Calculus of gallbladder without cholecystitis without obstruction: Secondary | ICD-10-CM | POA: Insufficient documentation

## 2016-02-16 DIAGNOSIS — F419 Anxiety disorder, unspecified: Secondary | ICD-10-CM | POA: Diagnosis not present

## 2016-02-16 DIAGNOSIS — Z87891 Personal history of nicotine dependence: Secondary | ICD-10-CM | POA: Insufficient documentation

## 2016-02-16 DIAGNOSIS — A0811 Acute gastroenteropathy due to Norwalk agent: Secondary | ICD-10-CM | POA: Diagnosis not present

## 2016-02-16 DIAGNOSIS — R197 Diarrhea, unspecified: Secondary | ICD-10-CM

## 2016-02-16 LAB — CBC
HCT: 40.8 % (ref 35.0–47.0)
Hemoglobin: 14.2 g/dL (ref 12.0–16.0)
MCH: 29.5 pg (ref 26.0–34.0)
MCHC: 34.9 g/dL (ref 32.0–36.0)
MCV: 84.4 fL (ref 80.0–100.0)
PLATELETS: 296 10*3/uL (ref 150–440)
RBC: 4.83 MIL/uL (ref 3.80–5.20)
RDW: 13.6 % (ref 11.5–14.5)
WBC: 17.9 10*3/uL — ABNORMAL HIGH (ref 3.6–11.0)

## 2016-02-16 LAB — URINALYSIS, COMPLETE (UACMP) WITH MICROSCOPIC
Bilirubin Urine: NEGATIVE
Glucose, UA: NEGATIVE mg/dL
KETONES UR: NEGATIVE mg/dL
Leukocytes, UA: NEGATIVE
Nitrite: NEGATIVE
PH: 5 (ref 5.0–8.0)
PROTEIN: 30 mg/dL — AB
Specific Gravity, Urine: 1.013 (ref 1.005–1.030)

## 2016-02-16 LAB — COMPREHENSIVE METABOLIC PANEL
ALBUMIN: 4.5 g/dL (ref 3.5–5.0)
ALT: 25 U/L (ref 14–54)
AST: 23 U/L (ref 15–41)
Alkaline Phosphatase: 126 U/L (ref 38–126)
Anion gap: 9 (ref 5–15)
BUN: 11 mg/dL (ref 6–20)
CHLORIDE: 105 mmol/L (ref 101–111)
CO2: 22 mmol/L (ref 22–32)
CREATININE: 0.53 mg/dL (ref 0.44–1.00)
Calcium: 9.5 mg/dL (ref 8.9–10.3)
GFR calc non Af Amer: >60 mL/min (ref >60)
GLUCOSE: 125 mg/dL — AB (ref 65–99)
Potassium: 3.4 mmol/L — ABNORMAL LOW (ref 3.5–5.1)
SODIUM: 136 mmol/L (ref 135–145)
Total Bilirubin: 0.7 mg/dL (ref 0.3–1.2)
Total Protein: 8 g/dL (ref 6.5–8.1)

## 2016-02-16 LAB — LIPASE, BLOOD: Lipase: 20 U/L (ref 11–51)

## 2016-02-16 MED ORDER — SODIUM CHLORIDE 0.9 % IV BOLUS (SEPSIS)
1000.0000 mL | Freq: Once | INTRAVENOUS | Status: AC
  Administered 2016-02-16: 1000 mL via INTRAVENOUS

## 2016-02-16 MED ORDER — LORAZEPAM 1 MG PO TABS: 1.0000 mg | ORAL_TABLET | Freq: Two times a day (BID) | ORAL | 0 refills | Status: DC

## 2016-02-16 MED ORDER — ONDANSETRON 4 MG PO TBDP
4.0000 mg | ORAL_TABLET | Freq: Once | ORAL | Status: AC
  Administered 2016-02-16: 4 mg via ORAL
  Filled 2016-02-16: qty 1

## 2016-02-16 MED ORDER — LORAZEPAM 1 MG PO TABS
1.0000 mg | ORAL_TABLET | Freq: Once | ORAL | Status: AC
  Administered 2016-02-16: 1 mg via ORAL
  Filled 2016-02-16: qty 1

## 2016-02-16 MED ORDER — ONDANSETRON 4 MG PO TBDP
4.0000 mg | ORAL_TABLET | Freq: Once | ORAL | Status: AC | PRN
  Administered 2016-02-16: 4 mg via ORAL
  Filled 2016-02-16: qty 1

## 2016-02-16 NOTE — ED Provider Notes (Signed)
[facility] Emergency Department Provider Note        Time seen: ----------------------------------------- 9:41 PM on 02/16/2016 -----------------------------------------    I have reviewed the triage vital signs and the nursing notes.   HISTORY  Chief Complaint Diarrhea    HPI Anita Massey is a 36 y.o. female presents to ER for nausea and diarrhea started today. Patient complains of pain and tingling to the upper body and pain to her chest. She thinks she may have gotten anxious when she saw her blood pressure was elevated. Recently she been seen by her doctor for high blood pressure) and is now on labetalol twice a day. She has had some occasional vertigo symptoms, nothing makes her symptoms better.   Past Medical History:  Diagnosis Date  . Anxiety state, unspecified   . Clotting disorder ([facility] [place])   . Contact dermatitis and other eczema, due to unspecified cause   . Dysuria   . Heart palpitations   . Hypertension   . Obesity   . Other psoriasis   . Palpitations   . Rosacea   . Syncope and collapse   . Undiagnosed cardiac murmurs   . Unspecified essential hypertension     Patient Active Problem List   Diagnosis Date Noted  . Essential hypertension, malignant 03/01/2013    Past Surgical History:  Procedure Laterality Date  . APPENDECTOMY    . DILATION AND CURETTAGE OF UTERUS      Allergies Biaxin [clarithromycin]  Social History Social History  Substance Use Topics  . Smoking status: Former Smoker    Packs/day: 0.25    Years: 1.00  . Smokeless tobacco: Never Used  . Alcohol use No    Review of Systems Constitutional: Negative for fever. Cardiovascular: Negative for chest pain. Respiratory: Negative for shortness of breath. Gastrointestinal: Negative for abdominal pain,Positive for diarrhea, nausea Genitourinary: Negative for dysuria. Musculoskeletal: Negative for back pain. Skin: Negative for rash. Neurological:  Negative for headaches, focal weakness. Positive for paresthesias  10-point ROS otherwise negative.  ____________________________________________   PHYSICAL EXAM:  VITAL SIGNS: ED Triage Vitals  Enc Vitals Group     BP 02/16/16 1822 (!) 173/116     Pulse Rate 02/16/16 1822 (!) 109     Resp 02/16/16 1822 (!) 22     Temp 02/16/16 1822 97.7 F (36.5 C)     Temp Source 02/16/16 1822 Oral     SpO2 02/16/16 1822 100 %     Weight 02/16/16 1822 180 lb (81.6 kg)     Height 02/16/16 1822 5\' 2"  (1.575 m)     Head Circumference --      Peak Flow --      Pain Score 02/16/16 1823 8     Pain Loc --      Pain Edu? --      Excl. in [place]? --    Constitutional: Alert and oriented. Anxious, no distress Eyes: Conjunctivae are normal. PERRL. Normal extraocular movements. ENT   Head: Normocephalic and atraumatic.   Nose: No congestion/rhinnorhea.   Mouth/Throat: Mucous membranes are moist.   Neck: No stridor. Cardiovascular: Normal rate, regular rhythm. No murmurs, rubs, or gallops. Respiratory: Normal respiratory effort without tachypnea nor retractions. Breath sounds are clear and equal bilaterally. No wheezes/rales/rhonchi. Gastrointestinal: Soft and nontender. Normal bowel sounds Musculoskeletal: Nontender with normal range of motion in all extremities. No lower extremity tenderness nor edema. Neurologic:  Normal speech and language. No gross focal neurologic deficits are appreciated.  Skin:  Skin  is warm, dry and intact. No rash noted. Psychiatric: Mood and affect are normal. Speech and behavior are normal.  ____________________________________________  EKG: Interpreted by me. Sinus tachycardia with rate 112 bpm, normal PR interval, normal QRS size, normal QT, nonspecific ST-T wave changes  ____________________________________________  ED COURSE:  Pertinent labs & imaging results that were available during my care of the patient were reviewed by me and considered in my  medical decision making (see chart for details). Patient presents to the ER in no distress. We will assess with labs and imaging. Clinical Course as of Feb 16 2311  Fri Feb 16, 2016  2144 Likely chronic leukocytosis. I suspect the bulk of her symptoms are anxiety related.  [JW]    Clinical Course User Index [JW] Earleen Newport, MD   Procedures ____________________________________________   LABS (pertinent positives/negatives)  Labs Reviewed  COMPREHENSIVE METABOLIC PANEL - Abnormal; Notable for the following:       Result Value   Potassium 3.4 (*)    Glucose, Bld 125 (*)    All other components within normal limits  CBC - Abnormal; Notable for the following:    WBC 17.9 (*)    All other components within normal limits  URINALYSIS, COMPLETE (UACMP) WITH MICROSCOPIC - Abnormal; Notable for the following:    Color, Urine YELLOW (*)    APPearance CLEAR (*)    Hgb urine dipstick LARGE (*)    Protein, ur 30 (*)    Bacteria, UA RARE (*)    Squamous Epithelial / LPF 0-5 (*)    All other components within normal limits  LIPASE, BLOOD    RADIOLOGY  Right upper quadrant ultrasound Ultrasound is pending at this time ____________________________________________  FINAL ASSESSMENT AND PLAN  Diarrhea, anxiety  Plan: Patient with labs and imaging as dictated above. Patient is in no acute distress, ultrasound is pending. Patient care checked out to Dr. Jerl Santos for final disposition.   Earleen Newport, MD   Note: This note was generated in part or whole with voice recognition software. Voice recognition is usually quite accurate but there are transcription errors that can and very often do occur. I apologize for any typographical errors that were not detected and corrected.     Earleen Newport, MD 02/16/16 [phone]

## 2016-02-16 NOTE — ED Notes (Signed)
Pt is attempting to urinate at this time. Pt stating that she is feeling better.

## 2016-02-16 NOTE — ED Notes (Signed)
Patient transported to US 

## 2016-02-16 NOTE — ED Notes (Addendum)
Pt stating she is here "for a number of complaints." Pt stating tingling in her upper body, abdominal pain, nausea and diarrhea, and a weird "feeling in the chest area." Pt stating multiple loose BM today, about 15. Pt denying voting and fever. Pt stating that she is concerned that she might have eaten under cooked chicken earlier today around noon. Pt stating she also has started a new medication where diarrhea was a side effect. Pt in NAD at this time and face appears flushed.

## 2016-02-16 NOTE — ED Triage Notes (Signed)
Pt complains of Nausea and diarrhea that started today. Pt also complains of pain and tingling to upper body and pain to chest. Pt states she checked her BP at home and noticed it was elevated. Pt also complains of dizziness.

## 2016-02-16 NOTE — Discharge Instructions (Addendum)
1. You may take pain and nausea medicines as needed (Percocet/Zofran #20). 2. Clear liquids 12 hours, then BRAT diet 3 days, then slowly advance diet as tolerated.  3. Return to the ER for worsening symptoms, persistent vomiting, difficult breathing or other concerns.

## 2016-02-17 LAB — GASTROINTESTINAL PANEL BY PCR, STOOL (REPLACES STOOL CULTURE)
Adenovirus F40/41: NOT DETECTED
Astrovirus: NOT DETECTED
Campylobacter species: NOT DETECTED
Cryptosporidium: NOT DETECTED
Cyclospora cayetanensis: NOT DETECTED
ENTAMOEBA HISTOLYTICA: NOT DETECTED
Enteroaggregative E coli (EAEC): NOT DETECTED
Enteropathogenic E coli (EPEC): NOT DETECTED
Enterotoxigenic E coli (ETEC): NOT DETECTED
Giardia lamblia: NOT DETECTED
NOROVIRUS GI/GII: DETECTED — AB
Plesimonas shigelloides: NOT DETECTED
Rotavirus A: NOT DETECTED
SAPOVIRUS (I, II, IV, AND V): NOT DETECTED
SHIGA LIKE TOXIN PRODUCING E COLI (STEC): NOT DETECTED
SHIGELLA/ENTEROINVASIVE E COLI (EIEC): NOT DETECTED
Salmonella species: NOT DETECTED
VIBRIO CHOLERAE: NOT DETECTED
VIBRIO SPECIES: NOT DETECTED
Yersinia enterocolitica: NOT DETECTED

## 2016-02-17 LAB — C DIFFICILE QUICK SCREEN W PCR REFLEX
C DIFFICLE (CDIFF) ANTIGEN: NEGATIVE
C Diff interpretation: NOT DETECTED
C Diff toxin: NEGATIVE

## 2016-02-17 MED ORDER — OXYCODONE-ACETAMINOPHEN 5-325 MG PO TABS: 1.0000 | ORAL_TABLET | ORAL | 0 refills | Status: DC | PRN

## 2016-02-17 MED ORDER — PROMETHAZINE HCL 25 MG/ML IJ SOLN
12.5000 mg | Freq: Once | INTRAMUSCULAR | Status: AC
  Administered 2016-02-17: 12.5 mg via INTRAVENOUS
  Filled 2016-02-17: qty 1

## 2016-02-17 MED ORDER — HYDROMORPHONE HCL 1 MG/ML IJ SOLN
0.5000 mg | Freq: Once | INTRAMUSCULAR | Status: AC
  Administered 2016-02-17: 0.5 mg via INTRAVENOUS
  Filled 2016-02-17: qty 1

## 2016-02-17 MED ORDER — ONDANSETRON 4 MG PO TBDP: 4.0000 mg | ORAL_TABLET | Freq: Three times a day (TID) | ORAL | 0 refills | Status: DC | PRN

## 2016-02-17 MED ORDER — SODIUM CHLORIDE 0.9 % IV BOLUS (SEPSIS)
1000.0000 mL | Freq: Once | INTRAVENOUS | Status: AC
  Administered 2016-02-17: 1000 mL via INTRAVENOUS

## 2016-02-17 NOTE — ED Notes (Signed)

## 2016-02-17 NOTE — ED Notes (Signed)
Pt assisted to ambulate to the toilet in the room and back to the bed. Pt does not appear to be in distress, is ambulating with a steady gait and does not report any pain.

## 2016-02-17 NOTE — ED Provider Notes (Signed)
-----------------------------------------   12:49 AM on 02/17/2016 -----------------------------------------  Ultrasound interpreted per Dr. Radene Knee: Cholelithiasis. No evidence for obstruction or cholecystitis.  Question of minimal adenomyomatosis along the gallbladder wall.   Updated patient of ultrasound results. Patient is complaining of pain which is exacerbating her anxiety and causing tachycardia. She is afebrile on vital sign recheck. Will infuse second liter IV fluids, Dilaudid and Phenergan for pain and nausea, and reassess. C. difficile is negative.  ----------------------------------------- 2:28 AM on 02/17/2016 -----------------------------------------  Biofire positive for norovirus. Patient is currently sleeping comfortably. IV fluids infusing. Heart rate 108.  ----------------------------------------- 3:20 AM on 02/17/2016 -----------------------------------------  Remains asleep in no acute distress. Heart rate 104. Since patient is alone without family present and resting in no acute distress, will let her sleep and infuse a third liter of fluid for additional supportive benefit.  ----------------------------------------- 6:10 AM on 02/17/2016 -----------------------------------------  Patient's husband present to take her home. She is feeling much improved. Will be discharged home with prescriptions for Percocet and Zofran, surgery referral for further evaluation of cholelithiasis. Strict return precautions given. Patient verbalizes understanding and agrees with plan of care.   Paulette Blanch, MD 02/17/16 [phone]

## 2016-03-04 ENCOUNTER — Other Ambulatory Visit: Payer: Self-pay

## 2016-03-07 ENCOUNTER — Encounter: Payer: Self-pay | Admitting: Surgery

## 2016-03-07 ENCOUNTER — Ambulatory Visit (INDEPENDENT_AMBULATORY_CARE_PROVIDER_SITE_OTHER): Payer: BLUE CROSS/BLUE SHIELD | Admitting: Surgery

## 2016-03-07 VITALS — BP 152/88 | HR 76 | Temp 97.9°F | Ht 64.0 in | Wt 178.0 lb

## 2016-03-07 DIAGNOSIS — K8020 Calculus of gallbladder without cholecystitis without obstruction: Secondary | ICD-10-CM | POA: Diagnosis not present

## 2016-03-07 MED ORDER — ONDANSETRON HCL 4 MG PO TABS: 4.0000 mg | ORAL_TABLET | Freq: Four times a day (QID) | ORAL | 0 refills | Status: DC | PRN

## 2016-03-07 NOTE — Progress Notes (Addendum)
Surgical Consultation  03/07/2016  Anita Massey is an 36 y.o. female.   Chief Complaint  Patient presents with  . Other    Cholelithiasis (02/17/16)   HPI: 36-year-old with a several month history of intermittent abdominal pain. She reports that her abdominal pain is being the epigastric area and right upper quadrant. Usually aggravated by heavy meals. She does have nausea and diarrhea. She has been worked And is C. difficile negative apparently did have an enteric virus at some point in time. She also reports that her pain usually radiates to her back and her shoulder blade. No evidence of fevers or chills. No evidence of biliary obstruction or obstructive jaundice. Her surgical history significant for an appendectomy and a C-section. She does have good cardiovascular performance and is able to do more than 4 Mets without any shortness of breath or chest pain. Workup included a right upper quadrant ultrasound that I have personally reviewed showing evidence of gallstone normal common bile duct no evidence of biliary dilation. No evidence of cholecystitis. We will also do a referral to GI for persistent diarrhea  ( not explained by GB)  Past Medical History:  Diagnosis Date  . ANA positive 09/04/2015   Overview:  Low titer ANA 1/80  . Anxiety state, unspecified   . Clotting disorder (HCC)   . Clotting disorder (HCC)   . Contact dermatitis and other eczema, due to unspecified cause   . Dysuria   . Essential hypertension, malignant 03/01/2013  . Heart palpitations   . Hypertension   . Multiple joint pain 09/04/2015  . Obesity   . Other psoriasis   . Palpitations   . Rosacea   . Syncope and collapse   . Undiagnosed cardiac murmurs   . Unspecified essential hypertension     Past Surgical History:  Procedure Laterality Date  . APPENDECTOMY  2009   [facility]  . CESAREAN SECTION  2016  . DILATION AND CURETTAGE OF UTERUS      Family History  Problem Relation Age of Onset  . Mitral  valve prolapse Mother   . Arrhythmia Mother   . Hypertension Mother   . Heart disease Maternal Grandfather   . Lung cancer Maternal Grandfather     Social History:  reports that she has quit smoking. She has a 0.25 pack-year smoking history. She has never used smokeless tobacco. She reports that she does not drink alcohol or use drugs.  Allergies:  Allergies  Allergen Reactions  . Biaxin [Clarithromycin] Hives  . Toradol [Ketorolac Tromethamine]     Chest pain   . Morphine And Related     Chest Pain     Medications reviewed.     ROS  Full ROS performed and is otherwise negative.   BP (!) 152/88   Pulse 76   Temp 97.9 F (36.6 C) (Oral)   Ht 5' 4" (1.626 m)   Wt 80.7 kg (178 lb)   BMI 30.55 kg/m   Physical Exam  Constitutional: She is oriented to person, place, and time and well-developed, well-nourished, and in no distress. No distress.  Eyes: No scleral icterus.  Neck: Normal range of motion. Neck supple. No JVD present. No tracheal deviation present.  Cardiovascular: Normal rate, regular rhythm and normal heart sounds.   Pulmonary/Chest: Effort normal. No respiratory distress. She has no wheezes. She has no rales.  Abdominal: Soft. She exhibits no distension and no mass. There is tenderness. There is no rebound and no guarding.  No peritonitis,   no murphy. Mild TTP RUQ  Musculoskeletal: Normal range of motion. She exhibits no edema.  Neurological: She is alert and oriented to person, place, and time. GCS score is 15.  Skin: Skin is warm and dry. She is not diaphoretic.  Psychiatric: Mood, memory, affect and judgment normal.  Nursing note and vitals reviewed.     No results found for this or any previous visit (from the past 48 hour(s)). No results found.  Assessment/Plan: Symptomatic cholelithiasis discussed with the patient in detail I do recommend cholecystectomy. I do think that significant symptoms will improve however the diarrhea may persist and some  of her abdominal pain may not be completely related  to her biliary disease. The risks, benefits, complications, treatment options, and expected outcomes were discussed with the patient. The possibilities of bleeding, recurrent infection, finding a normal gallbladder, perforation of viscus organs, damage to surrounding structures, bile leak, abscess formation, needing a drain placed, the need for additional procedures, reaction to medication, pulmonary aspiration,  failure to diagnose a condition, the possible need to convert to an open procedure, and creating a complication requiring transfusion or operation were discussed with the patient. The patient and/or family concurred with the proposed plan, giving informed consent.  She does have chronic diarrhea that is not explained by GB, we will make a referral for GI  Larance Ratledge, MD FACS General Surgeon  

## 2016-03-07 NOTE — Patient Instructions (Addendum)
Please try to stay away from fatty foods.  Please go and see the Gastroenterologist appointment.

## 2016-03-08 ENCOUNTER — Telehealth: Payer: Self-pay

## 2016-03-08 NOTE — Telephone Encounter (Signed)
Patient has been advised of Surgery Date as well as Pre-Admission appointment date, time, and location.  Surgery Date: 03/20/16  Pre-admit Appointment: 03/13/16 from 9a-1p (Phone)  Patient has been advised to call [phone] the day before surgery between 1-3pm to obtain arrival time.

## 2016-03-08 NOTE — Telephone Encounter (Signed)
Called patient but was unable to leave her a voicemail. If by any chance she calls, please give patient her surgery information. Thank you.  If patient has not called, I will call her back on Monday 03/11/2016.

## 2016-03-08 NOTE — Telephone Encounter (Signed)
No Authorization required for CPT code [phone] per Bea Graff.

## 2016-03-11 NOTE — Telephone Encounter (Signed)
Called patient and spouse and was not able to leave them a voicemail since they are not set-up.  I then called patient's mother Karna Christmas) and asked if there was a possibility that I could leave her daughter's surgery information and she stated that I could. I told her that if in case her daughter had any questions, to please call me. Mr. Karna Christmas understood and had no further questions.

## 2016-03-13 ENCOUNTER — Encounter
Admission: RE | Admit: 2016-03-13 | Discharge: 2016-03-13 | Disposition: A | Discharge status: Home / Self Care | Payer: BLUE CROSS/BLUE SHIELD | Source: Ambulatory Visit | Attending: Surgery | Admitting: Surgery

## 2016-03-13 HISTORY — DX: Genetic susceptibility to other disease: Z15.89

## 2016-03-13 HISTORY — DX: Anemia, unspecified: D64.9

## 2016-03-13 HISTORY — DX: Disease of blood and blood-forming organs, unspecified: D75.9

## 2016-03-13 HISTORY — DX: Gastro-esophageal reflux disease without esophagitis: K21.9

## 2016-03-13 HISTORY — DX: Methylenetetrahydrofolate reductase deficiency: E72.12

## 2016-03-13 NOTE — Pre-Procedure Instructions (Signed)
EKG: Interpreted by me. Sinus tachycardia with rate 112 bpm, normal PR interval, normal QRS size, normal QT, nonspecific ST-T wave changes  ____________________________________________  ED COURSE:  Pertinent labs & imaging results that were available during my care of the patient were reviewed by me and considered in my medical decision making (see chart for details). Patient presents to the ER in no distress. We will assess with labs and imaging.    Clinical Course as of Feb 16 2311  Fri Feb 16, 2016  2144 Likely chronic leukocytosis. I suspect the bulk of her symptoms are anxiety related.  [JW]    Clinical Course User Index [JW] Earleen Newport, MD   Procedures ____________________________________________   LABS (pertinent positives/negatives)       Labs Reviewed  COMPREHENSIVE METABOLIC PANEL - Abnormal; Notable for the following:       Result Value    Potassium 3.4 (*)    Glucose, Bld 125 (*)    All other components within normal limits  CBC - Abnormal; Notable for the following:    WBC 17.9 (*)    All other components within normal limits  URINALYSIS, COMPLETE (UACMP) WITH MICROSCOPIC - Abnormal; Notable for the following:    Color, Urine YELLOW (*)    APPearance CLEAR (*)    Hgb urine dipstick LARGE (*)    Protein, ur 30 (*)    Bacteria, UA RARE (*)    Squamous Epithelial / LPF 0-5 (*)    All other components within normal limits  LIPASE, BLOOD    RADIOLOGY  Right upper quadrant ultrasound Ultrasound is pending at this time ____________________________________________  FINAL ASSESSMENT AND PLAN  Diarrhea, anxiety  Plan: Patient with labs and imaging as dictated above. Patient is in no acute distress, ultrasound is pending. Patient care checked out to Dr. Jerl Santos for final disposition.   Earleen Newport, MD   Note: This note was generated in part or whole with voice recognition software. Voice recognition is  usually quite accurate but there are transcription errors that can and very often do occur. I apologize for any typographical errors that were not detected and corrected.     Earleen Newport, MD 02/16/16 [phone]     Electronically signed by Earleen Newport, MD at 02/16/2016 11:13 PM      ED on 02/16/2016        Detailed Report

## 2016-03-13 NOTE — Patient Instructions (Signed)
  Your procedure is scheduled on: 03-20-16 Report to Same Day Surgery 2nd floor medical mall [facility] Entrance-take elevator on left to 2nd floor.  Check in with surgery information desk.) To find out your arrival time please call [phone] between 1PM - 3PM on 03-19-16  Remember: Instructions that are not followed completely may result in serious medical risk, up to and including death, or upon the discretion of your surgeon and anesthesiologist your surgery may need to be rescheduled.    _x___ 1. Do not eat food or drink liquids after midnight. No gum chewing or hard candies.     __x__ 2. No Alcohol for 24 hours before or after surgery.   __x__3. No Smoking for 24 prior to surgery.   ____  4. Bring all medications with you on the day of surgery if instructed.    __x__ 5. Notify your doctor if there is any change in your medical condition     (cold, fever, infections).     Do not wear jewelry, make-up, hairpins, clips or nail polish.  Do not wear lotions, powders, or perfumes. You may wear deodorant.  Do not shave 48 hours prior to surgery. Men may shave face and neck.  Do not bring valuables to the hospital.    [facility] is not responsible for any belongings or valuables.               Contacts, dentures or bridgework may not be worn into surgery.  Leave your suitcase in the car. After surgery it may be brought to your room.  For patients admitted to the hospital, discharge time is determined by your treatment team.   Patients discharged the day of surgery will not be allowed to drive home.  You will need someone to drive you home and stay with you the night of your procedure.    Please read over the following fact sheets that you were given:   [facility] Preparing for Surgery and or MRSA Information   _x___ Take these medicines the morning of surgery with A SIP OF WATER:    1. LABAETOLOL  2. ZOLOFT  3.  4.  5.  6.  ____Fleets enema or Magnesium Citrate as  directed.   _x___ Use CHG Soap or sage wipes as directed on instruction sheet   ____ Use inhalers on the day of surgery and bring to hospital day of surgery  ____ Stop metformin 2 days prior to surgery    ____ Take 1/2 of usual insulin dose the night before surgery and none on the morning of surgery.   ____ Stop Aspirin, Coumadin, Pllavix ,Eliquis, Effient, or Pradaxa  x__ Stop Anti-inflammatories such as Advil, Aleve, Ibuprofen, Motrin, Naproxen,          Naprosyn, Goodies powders, EXCEDRIN or aspirin products NOW-Ok to take Tylenol.   ____ Stop supplements until after surgery.    ____ Bring C-Pap to the hospital.

## 2016-03-14 NOTE — Pre-Procedure Instructions (Signed)
SPOKE WITH DR Ronelle Nigh REGARDING PT WITH FACTOR 2 MUTATION-DR KEPHART DOES NOT WANT A HEMATOLOGY CLEARANCE BUT DOES WANT ME TO MAKE DR PABON AWARE THAT PT DOES HAVE THIS CLOTTING DISORDER AND IT WILL BE UP TO DR PABON IF HE WANTS TO PUT PT ON ANTICOAGULANT-  SPOKE WITH AMBER RN ABOUT THIS AND SHE WILL INFORM DR PABON OF THIS

## 2016-03-15 ENCOUNTER — Other Ambulatory Visit: Payer: Self-pay

## 2016-03-15 ENCOUNTER — Telehealth: Payer: Self-pay

## 2016-03-15 DIAGNOSIS — Z01812 Encounter for preprocedural laboratory examination: Secondary | ICD-10-CM

## 2016-03-15 NOTE — Telephone Encounter (Signed)
Received call from [facility] in Pre-admission testing in regards to patients Pre-op phone interview. 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 This was documented in her OBGYN records only, per [employer]. She has discussed this with Anesthesia and he would like to address VTE prophalaxis with Dr. Dahlia Byes prior to scheduled surgery on 03/20/16.  After speaking with Dr. Dahlia Byes and researching this type of disorder, patient is not in need of further VTE prophalaxis as this disorder causes prolonged prothrombin time and PTT.  We will order a PT/INR and PTT to be checked the morning of surgery. These orders have been placed.  Spoke with Baker Janus in Pre-admit and explained all information above.

## 2016-03-19 ENCOUNTER — Other Ambulatory Visit: Payer: Self-pay

## 2016-03-19 ENCOUNTER — Encounter: Payer: Self-pay | Admitting: *Deleted

## 2016-03-19 DIAGNOSIS — D682 Hereditary deficiency of other clotting factors: Secondary | ICD-10-CM

## 2016-03-19 MED ORDER — CEFAZOLIN SODIUM-DEXTROSE 2-4 GM/100ML-% IV SOLN
2.0000 g | INTRAVENOUS | Status: AC
  Administered 2016-03-20: 2 g via INTRAVENOUS

## 2016-03-20 ENCOUNTER — Ambulatory Visit
Admission: RE | Admit: 2016-03-20 | Discharge: 2016-03-20 | Disposition: A | Discharge status: Home / Self Care | Payer: BLUE CROSS/BLUE SHIELD | Source: Ambulatory Visit | Attending: Surgery | Admitting: Surgery

## 2016-03-20 ENCOUNTER — Encounter: Payer: Self-pay | Admitting: *Deleted

## 2016-03-20 ENCOUNTER — Encounter
Admission: RE | Disposition: A | Discharge status: Home / Self Care | Payer: Self-pay | Source: Ambulatory Visit | Attending: Surgery

## 2016-03-20 ENCOUNTER — Ambulatory Visit: Payer: BLUE CROSS/BLUE SHIELD | Admitting: Anesthesiology

## 2016-03-20 DIAGNOSIS — Z87891 Personal history of nicotine dependence: Secondary | ICD-10-CM | POA: Insufficient documentation

## 2016-03-20 DIAGNOSIS — F419 Anxiety disorder, unspecified: Secondary | ICD-10-CM | POA: Diagnosis not present

## 2016-03-20 DIAGNOSIS — I10 Essential (primary) hypertension: Secondary | ICD-10-CM | POA: Insufficient documentation

## 2016-03-20 DIAGNOSIS — Z6832 Body mass index (BMI) 32.0-32.9, adult: Secondary | ICD-10-CM | POA: Insufficient documentation

## 2016-03-20 DIAGNOSIS — E669 Obesity, unspecified: Secondary | ICD-10-CM | POA: Diagnosis not present

## 2016-03-20 DIAGNOSIS — K8020 Calculus of gallbladder without cholecystitis without obstruction: Secondary | ICD-10-CM

## 2016-03-20 DIAGNOSIS — K219 Gastro-esophageal reflux disease without esophagitis: Secondary | ICD-10-CM | POA: Insufficient documentation

## 2016-03-20 HISTORY — PX: CHOLECYSTECTOMY: SHX55

## 2016-03-20 LAB — PROTIME-INR
INR: 1.08
Prothrombin Time: 14 seconds (ref 11.4–15.2)

## 2016-03-20 LAB — APTT: APTT: 35 s (ref 24–36)

## 2016-03-20 LAB — POCT PREGNANCY, URINE: Preg Test, Ur: NEGATIVE

## 2016-03-20 SURGERY — LAPAROSCOPIC CHOLECYSTECTOMY
Anesthesia: General | Site: Abdomen | Wound class: Clean Contaminated

## 2016-03-20 MED ORDER — BUPIVACAINE-EPINEPHRINE (PF) 0.25% -1:200000 IJ SOLN
INTRAMUSCULAR | Status: AC
  Filled 2016-03-20: qty 30

## 2016-03-20 MED ORDER — DEXAMETHASONE SODIUM PHOSPHATE 10 MG/ML IJ SOLN
INTRAMUSCULAR | Status: AC
  Filled 2016-03-20: qty 1

## 2016-03-20 MED ORDER — SUCCINYLCHOLINE CHLORIDE 20 MG/ML IJ SOLN
INTRAMUSCULAR | Status: DC | PRN
  Administered 2016-03-20: 100 mg via INTRAVENOUS

## 2016-03-20 MED ORDER — FAMOTIDINE 20 MG PO TABS
ORAL_TABLET | ORAL | Status: AC
  Filled 2016-03-20: qty 1

## 2016-03-20 MED ORDER — FENTANYL CITRATE (PF) 100 MCG/2ML IJ SOLN
25.0000 ug | INTRAMUSCULAR | Status: DC | PRN
Start: 2016-03-20 — End: 2016-03-20
  Administered 2016-03-20 (×3): 50 ug via INTRAVENOUS

## 2016-03-20 MED ORDER — ONDANSETRON HCL 4 MG/2ML IJ SOLN
4.0000 mg | Freq: Once | INTRAMUSCULAR | Status: AC
  Administered 2016-03-20: 4 mg via INTRAVENOUS

## 2016-03-20 MED ORDER — OXYCODONE HCL 5 MG PO TABS
5.0000 mg | ORAL_TABLET | Freq: Once | ORAL | Status: AC | PRN
  Administered 2016-03-20: 5 mg via ORAL

## 2016-03-20 MED ORDER — ACETAMINOPHEN 10 MG/ML IV SOLN
INTRAVENOUS | Status: AC
  Filled 2016-03-20: qty 100

## 2016-03-20 MED ORDER — GABAPENTIN 300 MG PO CAPS
300.0000 mg | ORAL_CAPSULE | ORAL | Status: AC
  Administered 2016-03-20: 300 mg via ORAL

## 2016-03-20 MED ORDER — FAMOTIDINE 20 MG PO TABS
20.0000 mg | ORAL_TABLET | Freq: Once | ORAL | Status: AC
  Administered 2016-03-20: 20 mg via ORAL

## 2016-03-20 MED ORDER — CHLORHEXIDINE GLUCONATE CLOTH 2 % EX PADS: 6.0000 | MEDICATED_PAD | Freq: Once | CUTANEOUS | Status: DC

## 2016-03-20 MED ORDER — ACETAMINOPHEN 10 MG/ML IV SOLN
INTRAVENOUS | Status: DC | PRN
  Administered 2016-03-20: 1000 mg via INTRAVENOUS

## 2016-03-20 MED ORDER — MIDAZOLAM HCL 2 MG/2ML IJ SOLN
INTRAMUSCULAR | Status: DC | PRN
  Administered 2016-03-20: 2 mg via INTRAVENOUS

## 2016-03-20 MED ORDER — HYDROMORPHONE HCL 1 MG/ML IJ SOLN
0.2500 mg | INTRAMUSCULAR | Status: DC | PRN
  Administered 2016-03-20 (×3): 0.25 mg via INTRAVENOUS

## 2016-03-20 MED ORDER — GABAPENTIN 300 MG PO CAPS
ORAL_CAPSULE | ORAL | Status: AC
  Filled 2016-03-20: qty 1

## 2016-03-20 MED ORDER — FENTANYL CITRATE (PF) 100 MCG/2ML IJ SOLN
INTRAMUSCULAR | Status: AC
  Filled 2016-03-20: qty 2

## 2016-03-20 MED ORDER — SUCCINYLCHOLINE CHLORIDE 20 MG/ML IJ SOLN
INTRAMUSCULAR | Status: AC
  Filled 2016-03-20: qty 1

## 2016-03-20 MED ORDER — SUGAMMADEX SODIUM 200 MG/2ML IV SOLN
INTRAVENOUS | Status: DC | PRN
  Administered 2016-03-20: 200 mg via INTRAVENOUS

## 2016-03-20 MED ORDER — LACTATED RINGERS IV SOLN
INTRAVENOUS | Status: DC
  Administered 2016-03-20: 12:00:00 via INTRAVENOUS

## 2016-03-20 MED ORDER — OXYCODONE HCL 5 MG/5ML PO SOLN: 5.0000 mg | Freq: Once | ORAL | Status: AC | PRN

## 2016-03-20 MED ORDER — PROMETHAZINE HCL 25 MG/ML IJ SOLN
6.2500 mg | INTRAMUSCULAR | Status: DC | PRN
  Administered 2016-03-20: 6.25 mg via INTRAVENOUS

## 2016-03-20 MED ORDER — BUPIVACAINE-EPINEPHRINE 0.25% -1:200000 IJ SOLN
INTRAMUSCULAR | Status: DC | PRN
  Administered 2016-03-20: 30 mL

## 2016-03-20 MED ORDER — CEFAZOLIN SODIUM-DEXTROSE 2-4 GM/100ML-% IV SOLN
INTRAVENOUS | Status: AC
  Filled 2016-03-20: qty 100

## 2016-03-20 MED ORDER — OXYCODONE-ACETAMINOPHEN 7.5-325 MG PO TABS: 2.0000 | ORAL_TABLET | ORAL | 0 refills | Status: DC | PRN

## 2016-03-20 MED ORDER — MIDAZOLAM HCL 2 MG/2ML IJ SOLN
INTRAMUSCULAR | Status: AC
  Filled 2016-03-20: qty 2

## 2016-03-20 MED ORDER — LIDOCAINE HCL (CARDIAC) 20 MG/ML IV SOLN
INTRAVENOUS | Status: DC | PRN
  Administered 2016-03-20: 100 mg via INTRAVENOUS

## 2016-03-20 MED ORDER — FENTANYL CITRATE (PF) 100 MCG/2ML IJ SOLN
INTRAMUSCULAR | Status: AC
  Administered 2016-03-20: 50 ug via INTRAVENOUS
  Filled 2016-03-20: qty 2

## 2016-03-20 MED ORDER — PROPOFOL 10 MG/ML IV BOLUS
INTRAVENOUS | Status: AC
  Filled 2016-03-20: qty 20

## 2016-03-20 MED ORDER — HYDROMORPHONE HCL 1 MG/ML IJ SOLN
INTRAMUSCULAR | Status: AC
  Administered 2016-03-20: 0.25 mg via INTRAVENOUS
  Filled 2016-03-20: qty 1

## 2016-03-20 MED ORDER — SUGAMMADEX SODIUM 200 MG/2ML IV SOLN
INTRAVENOUS | Status: AC
  Filled 2016-03-20: qty 2

## 2016-03-20 MED ORDER — ONDANSETRON HCL 4 MG PO TABS: 4.0000 mg | ORAL_TABLET | Freq: Four times a day (QID) | ORAL | 0 refills | Status: DC | PRN

## 2016-03-20 MED ORDER — ROCURONIUM BROMIDE 100 MG/10ML IV SOLN
INTRAVENOUS | Status: DC | PRN
  Administered 2016-03-20: 40 mg via INTRAVENOUS
  Administered 2016-03-20: 10 mg via INTRAVENOUS

## 2016-03-20 MED ORDER — MEPERIDINE HCL 50 MG/ML IJ SOLN: 6.2500 mg | INTRAMUSCULAR | Status: DC | PRN

## 2016-03-20 MED ORDER — PROPOFOL 10 MG/ML IV BOLUS
INTRAVENOUS | Status: DC | PRN
  Administered 2016-03-20: 150 mg via INTRAVENOUS

## 2016-03-20 MED ORDER — PROMETHAZINE HCL 25 MG/ML IJ SOLN
INTRAMUSCULAR | Status: AC
  Administered 2016-03-20: 6.25 mg via INTRAVENOUS
  Filled 2016-03-20: qty 1

## 2016-03-20 MED ORDER — EPHEDRINE SULFATE 50 MG/ML IJ SOLN
INTRAMUSCULAR | Status: DC | PRN
  Administered 2016-03-20: 10 mg via INTRAVENOUS

## 2016-03-20 MED ORDER — LIDOCAINE HCL (PF) 2 % IJ SOLN
INTRAMUSCULAR | Status: AC
  Filled 2016-03-20: qty 2

## 2016-03-20 MED ORDER — OXYCODONE HCL 5 MG PO TABS
ORAL_TABLET | ORAL | Status: AC
  Filled 2016-03-20: qty 1

## 2016-03-20 MED ORDER — GLYCOPYRROLATE 0.2 MG/ML IJ SOLN
INTRAMUSCULAR | Status: DC | PRN
  Administered 2016-03-20: 0.2 mg via INTRAVENOUS

## 2016-03-20 MED ORDER — SODIUM CHLORIDE 0.9 % IJ SOLN
INTRAMUSCULAR | Status: AC
  Filled 2016-03-20: qty 10

## 2016-03-20 MED ORDER — FENTANYL CITRATE (PF) 100 MCG/2ML IJ SOLN
INTRAMUSCULAR | Status: DC | PRN
  Administered 2016-03-20: 50 ug via INTRAVENOUS

## 2016-03-20 MED ORDER — ONDANSETRON HCL 4 MG/2ML IJ SOLN
INTRAMUSCULAR | Status: AC
  Administered 2016-03-20: 4 mg via INTRAVENOUS
  Filled 2016-03-20: qty 2

## 2016-03-20 MED ORDER — ONDANSETRON HCL 4 MG/2ML IJ SOLN
INTRAMUSCULAR | Status: AC
  Filled 2016-03-20: qty 2

## 2016-03-20 MED ORDER — DEXAMETHASONE SODIUM PHOSPHATE 10 MG/ML IJ SOLN
INTRAMUSCULAR | Status: DC | PRN
  Administered 2016-03-20: 10 mg via INTRAVENOUS

## 2016-03-20 MED ORDER — ROCURONIUM BROMIDE 50 MG/5ML IV SOLN
INTRAVENOUS | Status: AC
  Filled 2016-03-20: qty 1

## 2016-03-20 SURGICAL SUPPLY — 46 items
APPLICATOR COTTON TIP 6IN STRL (MISCELLANEOUS) IMPLANT
APPLIER CLIP 5 13 M/L LIGAMAX5 (MISCELLANEOUS) ×2
BLADE SURG 15 STRL LF DISP TIS (BLADE) ×1 IMPLANT
BLADE SURG 15 STRL SS (BLADE) ×1
CANISTER SUCT 1200ML W/VALVE (MISCELLANEOUS) ×2 IMPLANT
CHLORAPREP W/TINT 26ML (MISCELLANEOUS) ×2 IMPLANT
CHOLANGIOGRAM CATH TAUT (CATHETERS) IMPLANT
CLEANER CAUTERY TIP 5X5 PAD (MISCELLANEOUS) ×1 IMPLANT
CLIP APPLIE 5 13 M/L LIGAMAX5 (MISCELLANEOUS) ×1 IMPLANT
DECANTER SPIKE VIAL GLASS SM (MISCELLANEOUS) IMPLANT
DEVICE TROCAR PUNCTURE CLOSURE (ENDOMECHANICALS) IMPLANT
DRAPE C-ARM XRAY 36X54 (DRAPES) IMPLANT
ELECT CAUTERY BLADE 6.4 (BLADE) ×2 IMPLANT
ELECT REM PT RETURN 9FT ADLT (ELECTROSURGICAL) ×2
ELECTRODE REM PT RTRN 9FT ADLT (ELECTROSURGICAL) ×1 IMPLANT
ENDOPOUCH RETRIEVER 10 (MISCELLANEOUS) ×2 IMPLANT
GLOVE BIO SURGEON STRL SZ7 (GLOVE) ×4 IMPLANT
GOWN STRL REUS W/ TWL LRG LVL3 (GOWN DISPOSABLE) ×3 IMPLANT
GOWN STRL REUS W/TWL LRG LVL3 (GOWN DISPOSABLE) ×3
IRRIGATION STRYKERFLOW (MISCELLANEOUS) ×1 IMPLANT
IRRIGATOR STRYKERFLOW (MISCELLANEOUS) ×2
IV CATH ANGIO 12GX3 LT BLUE (NEEDLE) ×2 IMPLANT
IV NS 1000ML (IV SOLUTION) ×1
IV NS 1000ML BAXH (IV SOLUTION) ×1 IMPLANT
L-HOOK LAP DISP 36CM (ELECTROSURGICAL) ×2
LHOOK LAP DISP 36CM (ELECTROSURGICAL) ×1 IMPLANT
LIQUID BAND (GAUZE/BANDAGES/DRESSINGS) ×2 IMPLANT
NEEDLE HYPO 22GX1.5 SAFETY (NEEDLE) ×2 IMPLANT
PACK LAP CHOLECYSTECTOMY (MISCELLANEOUS) ×2 IMPLANT
PAD CLEANER CAUTERY TIP 5X5 (MISCELLANEOUS) ×1
PENCIL ELECTRO HAND CTR (MISCELLANEOUS) ×2 IMPLANT
SCISSORS METZENBAUM CVD 33 (INSTRUMENTS) ×2 IMPLANT
SLEEVE ENDOPATH XCEL 5M (ENDOMECHANICALS) ×4 IMPLANT
SOL ANTI-FOG 6CC FOG-OUT (MISCELLANEOUS) ×1 IMPLANT
SOL FOG-OUT ANTI-FOG 6CC (MISCELLANEOUS) ×1
SPONGE LAP 18X18 5 PK (GAUZE/BANDAGES/DRESSINGS) IMPLANT
STOPCOCK 3 WAY  REPLAC (MISCELLANEOUS) IMPLANT
SUT ETHIBOND 0 MO6 C/R (SUTURE) IMPLANT
SUT MNCRL AB 4-0 PS2 18 (SUTURE) ×2 IMPLANT
SUT VIC AB 0 CT2 27 (SUTURE) IMPLANT
SUT VICRYL 0 AB UR-6 (SUTURE) ×4 IMPLANT
SYR 20CC LL (SYRINGE) ×2 IMPLANT
TROCAR XCEL BLUNT TIP 100MML (ENDOMECHANICALS) ×2 IMPLANT
TROCAR XCEL NON-BLD 5MMX100MML (ENDOMECHANICALS) ×2 IMPLANT
TUBING INSUFFLATOR HI FLOW (MISCELLANEOUS) ×2 IMPLANT
WATER STERILE IRR 1000ML POUR (IV SOLUTION) ×2 IMPLANT

## 2016-03-20 NOTE — Anesthesia Preprocedure Evaluation (Signed)
Anesthesia Evaluation  Patient identified by MRN, date of birth, ID band Patient awake    Reviewed: Allergy & Precautions, NPO status , Patient's Chart, lab work & pertinent test results  History of Anesthesia Complications Negative for: history of anesthetic complications  Airway Mallampati: II  TM Distance: >3 FB Neck ROM: Full    Dental no notable dental hx.    Pulmonary neg sleep apnea, neg COPD, former smoker,    breath sounds clear to auscultation- rhonchi (-) wheezing      Cardiovascular hypertension, Pt. 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) No date: Clotting disorder ([place]) No date: Contact dermatitis and other eczema, due to un* No date: Dysuria 03/01/2013: Essential hypertension, malignant No date: GERD (gastroesophageal reflux disease)     Comment: OCC No date: Headache     Comment: MIGRAINES No date: Heart palpitations No date: 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[place]) No date: Hypertension 09/04/2015: Multiple joint pain No date: Obesity No date: Other psoriasis No date: Palpitations No date: Rosacea No date: Syncope and collapse No date: Undiagnosed cardiac murmurs No date: Unspecified essential hypertension   Reproductive/Obstetrics                              Anesthesia Physical Anesthesia Plan  ASA: II  Anesthesia Plan: General   Post-op Pain Management:    Induction: Intravenous  Airway Management Planned: Oral ETT  Additional Equipment:   Intra-op Plan:   Post-operative Plan: Extubation in OR  Informed Consent: I have reviewed the patients History and Physical, chart, labs and discussed the procedure including the risks, benefits and alternatives for the proposed anesthesia with the patient or authorized representative who has indicated his/her understanding and acceptance.   Dental advisory given  Plan Discussed with: CRNA and Anesthesiologist  Anesthesia Plan Comments:         Anesthesia Quick Evaluation

## 2016-03-20 NOTE — Anesthesia Procedure Notes (Signed)
Procedure Name: Intubation Date/Time: 03/20/2016 12:28 PM Performed by: Aline Brochure Pre-anesthesia Checklist: Patient identified, Emergency Drugs available, Suction available and Patient being monitored Patient Re-evaluated:Patient Re-evaluated prior to inductionOxygen Delivery Method: Circle system utilized Preoxygenation: Pre-oxygenation with 100% oxygen Intubation Type: IV induction Ventilation: Mask ventilation without difficulty Laryngoscope Size: Mac and 3 Grade View: Grade II Tube type: Oral Tube size: 7.0 mm Number of attempts: 1 Airway Equipment and Method: Stylet Placement Confirmation: positive ETCO2 and breath sounds checked- equal and bilateral Secured at: 21 cm Tube secured with: Tape Difficulty Due To: Difficult Airway- due to anterior larynx

## 2016-03-20 NOTE — Transfer of Care (Signed)
Immediate Anesthesia Transfer of Care Note  Patient: Anita Massey  Procedure(s) Performed: Procedure(s): LAPAROSCOPIC CHOLECYSTECTOMY (N/A)  Patient Location: PACU  Anesthesia Type:General  Level of Consciousness: awake  Airway & Oxygen Therapy: Patient connected to face mask oxygen  Post-op Assessment: Post -op Vital signs reviewed and stable  Post vital signs: stable  Last Vitals:  Vitals:   03/20/16 1105 03/20/16 1334  BP: (!) 147/91 136/77  Pulse: 76 82  Resp: 18 19  Temp: 36.8 C 37.2 C    Last Pain:  Vitals:   03/20/16 1334  TempSrc: Temporal  PainSc:          Complications: No apparent anesthesia complications

## 2016-03-20 NOTE — Op Note (Signed)
Laparoscopic Cholecystectomy  Pre-operative Diagnosis: Symptomatic cholelithiasis  Post-operative Diagnosis: Same  Procedure: Laparoscopic cholecystectomy  Surgeon: Caroleen Hamman, MD FACS  Anesthesia: Gen. with endotracheal tube   Findings: GB  Estimated Blood Loss: 10cc                 Specimens: Gallbladder           Complications: none   Procedure Details  The patient was seen again in the Holding Room. The benefits, complications, treatment options, and expected outcomes were discussed with the patient. The risks of bleeding, infection, recurrence of symptoms, failure to resolve symptoms, bile duct damage, bile duct leak, retained common bile duct stone, bowel injury, any of which could require further surgery and/or ERCP, stent, or papillotomy were reviewed with the patient. The likelihood of improving the patient's symptoms with return to their baseline status is good.  The patient and/or family concurred with the proposed plan, giving informed consent.  The patient was taken to Operating Room, identified as Anita Massey and the procedure verified as Laparoscopic Cholecystectomy.  A Time Out was held and the above information confirmed.  Prior to the induction of general anesthesia, antibiotic prophylaxis was administered. VTE prophylaxis was in place. General endotracheal anesthesia was then administered and tolerated well. After the induction, the abdomen was prepped with Chloraprep and draped in the sterile fashion. The patient was positioned in the supine position.  Local anesthetic  was injected into the skin near the umbilicus and an incision made. Cut down technique was used to enter the abdominal cavity and a Hasson trochar was placed after two vicryl stitches were anchored to the fascia. Pneumoperitoneum was then created with CO2 and tolerated well without any adverse changes in the patient's vital signs.  Three 5-mm ports were placed in the right upper quadrant all  under direct vision. All skin incisions  were infiltrated with a local anesthetic agent before making the incision and placing the trocars.   The patient was positioned  in reverse Trendelenburg, tilted slightly to the patient's left.  The gallbladder was identified, the fundus grasped and retracted cephalad. Adhesions were lysed bluntly. The infundibulum was grasped and retracted laterally, exposing the peritoneum overlying the triangle of Calot. This was then divided and exposed in a blunt fashion. An extended critical view of the cystic duct and cystic artery was obtained.  The cystic duct was clearly identified and bluntly dissected.   Artery and duct were double clipped and divided.  The gallbladder was taken from the gallbladder fossa in a retrograde fashion with the electrocautery. The gallbladder was removed and placed in an Endocatch bag. The liver bed was irrigated and inspected. Hemostasis was achieved with the electrocautery. Copious irrigation was utilized and was repeatedly aspirated until clear.  The gallbladder and Endocatch sac were then removed.  Inspection of the right upper quadrant was performed. No bleeding, bile duct injury or leak, or bowel injury was noted. Pneumoperitoneum was released.  The periumbilical port site was closed with figure-of-eight 0 Vicryl sutures. 4-0 subcuticular Monocryl was used to close the skin. Dermabond was  applied.  The patient was then extubated and brought to the recovery room in stable condition. Sponge, lap, and needle counts were correct at closure and at the conclusion of the case.               Caroleen Hamman, MD, FACS

## 2016-03-20 NOTE — Discharge Instructions (Signed)

## 2016-03-20 NOTE — Anesthesia Post-op Follow-up Note (Cosign Needed)
Anesthesia QCDR form completed.        

## 2016-03-20 NOTE — H&P (View-Only) (Signed)
Surgical Consultation  03/07/2016  Anita Massey is an 36 y.o. female.   Chief Complaint  Patient presents with  . Other    Cholelithiasis (02/17/16)   HPI: 36 year old with a several month history of intermittent abdominal pain. She reports that her abdominal pain is being the epigastric area and right upper quadrant. Usually aggravated by heavy meals. She does have nausea and diarrhea. She has been worked And is C. difficile negative apparently did have an enteric virus at some point in time. She also reports that her pain usually radiates to her back and her shoulder blade. No evidence of fevers or chills. No evidence of biliary obstruction or obstructive jaundice. Her surgical history significant for an appendectomy and a C-section. She does have good cardiovascular performance and is able to do more than 4 Mets without any shortness of breath or chest pain. Workup included a right upper quadrant ultrasound that I have personally reviewed showing evidence of gallstone normal common bile duct no evidence of biliary dilation. No evidence of cholecystitis. We will also do a referral to GI for persistent diarrhea  ( not explained by GB)  Past Medical History:  Diagnosis Date  . ANA positive 09/04/2015   Overview:  Low titer ANA 1/80  . Anxiety state, unspecified   . Clotting disorder ([place])   . Clotting disorder ([place])   . Contact dermatitis and other eczema, due to unspecified cause   . Dysuria   . Essential hypertension, malignant 03/01/2013  . Heart palpitations   . Hypertension   . Multiple joint pain 09/04/2015  . Obesity   . Other psoriasis   . Palpitations   . Rosacea   . Syncope and collapse   . Undiagnosed cardiac murmurs   . Unspecified essential hypertension     Past Surgical History:  Procedure Laterality Date  . APPENDECTOMY  2009   [facility]  . CESAREAN SECTION  2016  . DILATION AND CURETTAGE OF UTERUS      Family History  Problem Relation Age of Onset  . Mitral  valve prolapse Mother   . Arrhythmia Mother   . Hypertension Mother   . Heart disease Maternal Grandfather   . Lung cancer Maternal Grandfather     Social History:  reports that she has quit smoking. She has a 0.25 pack-year smoking history. She has never used smokeless tobacco. She reports that she does not drink alcohol or use drugs.  Allergies:  Allergies  Allergen Reactions  . Biaxin [Clarithromycin] Hives  . Toradol [Ketorolac Tromethamine]     Chest pain   . Morphine And Related     Chest Pain     Medications reviewed.     ROS  Full ROS performed and is otherwise negative.   BP (!) 152/88   Pulse 76   Temp 97.9 F (36.6 C) (Oral)   Ht 5\' 4"  (1.626 m)   Wt 80.7 kg (178 lb)   BMI 30.55 kg/m   Physical Exam  Constitutional: She is oriented to person, place, and time and well-developed, well-nourished, and in no distress. No distress.  Eyes: No scleral icterus.  Neck: Normal range of motion. Neck supple. No JVD present. No tracheal deviation present.  Cardiovascular: Normal rate, regular rhythm and normal heart sounds.   Pulmonary/Chest: Effort normal. No respiratory distress. She has no wheezes. She has no rales.  Abdominal: Soft. She exhibits no distension and no mass. There is tenderness. There is no rebound and no guarding.  No peritonitis,  no murphy. Mild TTP RUQ  Musculoskeletal: Normal range of motion. She exhibits no edema.  Neurological: She is alert and oriented to person, place, and time. GCS score is 15.  Skin: Skin is warm and dry. She is not diaphoretic.  Psychiatric: Mood, memory, affect and judgment normal.  Nursing note and vitals reviewed.     No results found for this or any previous visit (from the past 48 hour(s)). No results found.  Assessment/Plan: Symptomatic cholelithiasis discussed with the patient in detail I do recommend cholecystectomy. I do think that significant symptoms will improve however the diarrhea may persist and some  of her abdominal pain may not be completely related  to her biliary disease. The risks, benefits, complications, treatment options, and expected outcomes were discussed with the patient. The possibilities of bleeding, recurrent infection, finding a normal gallbladder, perforation of viscus organs, damage to surrounding structures, bile leak, abscess formation, needing a drain placed, the need for additional procedures, reaction to medication, pulmonary aspiration,  failure to diagnose a condition, the possible need to convert to an open procedure, and creating a complication requiring transfusion or operation were discussed with the patient. The patient and/or family concurred with the proposed plan, giving informed consent.  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

## 2016-03-20 NOTE — Interval H&P Note (Signed)
History and Physical Interval Note:  03/20/2016 11:18 AM  Anita Massey  has presented today for surgery, with the diagnosis of Gallstones  The various methods of treatment have been discussed with the patient and family. After consideration of risks, benefits and other options for treatment, the patient has consented to  Procedure(s): LAPAROSCOPIC CHOLECYSTECTOMY (N/A) as a surgical intervention .  The patient's history has been reviewed, patient examined, no change in status, stable for surgery.  I have reviewed the patient's chart and labs.  Questions were answered to the patient's satisfaction.     Britton

## 2016-03-21 ENCOUNTER — Encounter: Payer: Self-pay | Admitting: Surgery

## 2016-03-21 LAB — SURGICAL PATHOLOGY

## 2016-03-21 NOTE — Anesthesia Postprocedure Evaluation (Signed)
Anesthesia Post Note  Patient: Anita Massey  Procedure(s) Performed: Procedure(s) (LRB): LAPAROSCOPIC CHOLECYSTECTOMY (N/A)  Anesthesia Type: General     Last Vitals:  Vitals:   03/20/16 1545 03/20/16 1552  BP: 118/69 129/69  Pulse: 86 (!) 101  Resp: 12 16  Temp:  36.8 C    Last Pain:  Vitals:   03/21/16 1018  TempSrc:   PainSc: 4                  Andreka Stucki

## 2016-03-23 ENCOUNTER — Observation Stay
Admission: EM | Admit: 2016-03-23 | Discharge: 2016-03-25 | Disposition: A | Discharge status: Home / Self Care | Payer: BLUE CROSS/BLUE SHIELD | Attending: Surgery | Admitting: Surgery

## 2016-03-23 ENCOUNTER — Emergency Department: Payer: BLUE CROSS/BLUE SHIELD

## 2016-03-23 ENCOUNTER — Encounter: Payer: Self-pay | Admitting: Emergency Medicine

## 2016-03-23 DIAGNOSIS — Y838 Other surgical procedures as the cause of abnormal reaction of the patient, or of later complication, without mention of misadventure at the time of the procedure: Secondary | ICD-10-CM | POA: Diagnosis not present

## 2016-03-23 DIAGNOSIS — L500 Allergic urticaria: Secondary | ICD-10-CM | POA: Diagnosis present

## 2016-03-23 DIAGNOSIS — Z6832 Body mass index (BMI) 32.0-32.9, adult: Secondary | ICD-10-CM | POA: Diagnosis not present

## 2016-03-23 DIAGNOSIS — Z885 Allergy status to narcotic agent status: Secondary | ICD-10-CM | POA: Insufficient documentation

## 2016-03-23 DIAGNOSIS — E669 Obesity, unspecified: Secondary | ICD-10-CM | POA: Diagnosis not present

## 2016-03-23 DIAGNOSIS — L03311 Cellulitis of abdominal wall: Secondary | ICD-10-CM

## 2016-03-23 DIAGNOSIS — R1011 Right upper quadrant pain: Secondary | ICD-10-CM | POA: Diagnosis present

## 2016-03-23 DIAGNOSIS — I10 Essential (primary) hypertension: Secondary | ICD-10-CM | POA: Diagnosis not present

## 2016-03-23 DIAGNOSIS — K219 Gastro-esophageal reflux disease without esophagitis: Secondary | ICD-10-CM | POA: Diagnosis not present

## 2016-03-23 DIAGNOSIS — F419 Anxiety disorder, unspecified: Secondary | ICD-10-CM | POA: Diagnosis not present

## 2016-03-23 DIAGNOSIS — Z7982 Long term (current) use of aspirin: Secondary | ICD-10-CM | POA: Diagnosis not present

## 2016-03-23 DIAGNOSIS — L7682 Other postprocedural complications of skin and subcutaneous tissue: Principal | ICD-10-CM | POA: Insufficient documentation

## 2016-03-23 DIAGNOSIS — Z9049 Acquired absence of other specified parts of digestive tract: Secondary | ICD-10-CM | POA: Insufficient documentation

## 2016-03-23 DIAGNOSIS — R109 Unspecified abdominal pain: Secondary | ICD-10-CM | POA: Diagnosis present

## 2016-03-23 DIAGNOSIS — Z87891 Personal history of nicotine dependence: Secondary | ICD-10-CM | POA: Insufficient documentation

## 2016-03-23 LAB — CBC WITH DIFFERENTIAL/PLATELET
BASOS ABS: 0 10*3/uL (ref 0–0.1)
BASOS PCT: 0 %
Eosinophils Absolute: 0.1 10*3/uL (ref 0–0.7)
Eosinophils Relative: 1 %
HEMATOCRIT: 37.6 % (ref 35.0–47.0)
Hemoglobin: 12.8 g/dL (ref 12.0–16.0)
Lymphocytes Relative: 31 %
Lymphs Abs: 2.9 10*3/uL (ref 1.0–3.6)
MCH: 29.1 pg (ref 26.0–34.0)
MCHC: 34 g/dL (ref 32.0–36.0)
MCV: 85.6 fL (ref 80.0–100.0)
MONO ABS: 1 10*3/uL — AB (ref 0.2–0.9)
Monocytes Relative: 11 %
NEUTROS ABS: 5.2 10*3/uL (ref 1.4–6.5)
NEUTROS PCT: 57 %
Platelets: 279 10*3/uL (ref 150–440)
RBC: 4.39 MIL/uL (ref 3.80–5.20)
RDW: 13.8 % (ref 11.5–14.5)
WBC: 9.3 10*3/uL (ref 3.6–11.0)

## 2016-03-23 LAB — COMPREHENSIVE METABOLIC PANEL
ALBUMIN: 3.8 g/dL (ref 3.5–5.0)
ALT: 364 U/L — ABNORMAL HIGH (ref 14–54)
AST: 267 U/L — AB (ref 15–41)
Alkaline Phosphatase: 192 U/L — ABNORMAL HIGH (ref 38–126)
Anion gap: 6 (ref 5–15)
BILIRUBIN TOTAL: 1.3 mg/dL — AB (ref 0.3–1.2)
BUN: 10 mg/dL (ref 6–20)
CHLORIDE: 104 mmol/L (ref 101–111)
CO2: 26 mmol/L (ref 22–32)
Calcium: 9.7 mg/dL (ref 8.9–10.3)
Creatinine, Ser: 0.66 mg/dL (ref 0.44–1.00)
GFR calc Af Amer: >60 mL/min (ref >60)
GFR calc non Af Amer: >60 mL/min (ref >60)
GLUCOSE: 99 mg/dL (ref 65–99)
POTASSIUM: 3.6 mmol/L (ref 3.5–5.1)
Sodium: 136 mmol/L (ref 135–145)
TOTAL PROTEIN: 7.2 g/dL (ref 6.5–8.1)

## 2016-03-23 MED ORDER — SERTRALINE HCL 50 MG PO TABS
50.0000 mg | ORAL_TABLET | ORAL | Status: DC
  Administered 2016-03-24 – 2016-03-25 (×3): 50 mg via ORAL
  Filled 2016-03-23 (×2): qty 1

## 2016-03-23 MED ORDER — ONDANSETRON HCL 4 MG/2ML IJ SOLN
4.0000 mg | Freq: Once | INTRAMUSCULAR | Status: AC
  Administered 2016-03-23: 4 mg via INTRAVENOUS

## 2016-03-23 MED ORDER — PROMETHAZINE HCL 25 MG RE SUPP: 12.5000 mg | Freq: Four times a day (QID) | RECTAL | Status: DC | PRN

## 2016-03-23 MED ORDER — LACTATED RINGERS IV SOLN
INTRAVENOUS | Status: DC
  Administered 2016-03-23 – 2016-03-24 (×2): via INTRAVENOUS

## 2016-03-23 MED ORDER — POLYETHYLENE GLYCOL 3350 17 G PO PACK
17.0000 g | PACK | Freq: Every day | ORAL | Status: DC
  Administered 2016-03-23 – 2016-03-25 (×3): 17 g via ORAL
  Filled 2016-03-23 (×3): qty 1

## 2016-03-23 MED ORDER — CIPROFLOXACIN IN D5W 400 MG/200ML IV SOLN
400.0000 mg | Freq: Two times a day (BID) | INTRAVENOUS | Status: DC
  Administered 2016-03-23 – 2016-03-25 (×4): 400 mg via INTRAVENOUS
  Filled 2016-03-23 (×6): qty 200

## 2016-03-23 MED ORDER — ONDANSETRON HCL 4 MG/2ML IJ SOLN
INTRAMUSCULAR | Status: AC
  Administered 2016-03-23: 4 mg via INTRAVENOUS
  Filled 2016-03-23: qty 2

## 2016-03-23 MED ORDER — ONDANSETRON HCL 4 MG/2ML IJ SOLN
4.0000 mg | Freq: Once | INTRAMUSCULAR | Status: AC
  Administered 2016-03-23: 4 mg via INTRAVENOUS
  Filled 2016-03-23: qty 2

## 2016-03-23 MED ORDER — ONDANSETRON HCL 4 MG/2ML IJ SOLN
4.0000 mg | Freq: Four times a day (QID) | INTRAMUSCULAR | Status: DC | PRN
  Administered 2016-03-23 – 2016-03-25 (×4): 4 mg via INTRAVENOUS
  Filled 2016-03-23 (×5): qty 2

## 2016-03-23 MED ORDER — HYDROMORPHONE HCL 1 MG/ML IJ SOLN
0.5000 mg | Freq: Once | INTRAMUSCULAR | Status: AC
  Administered 2016-03-23: 0.5 mg via INTRAVENOUS
  Filled 2016-03-23: qty 1

## 2016-03-23 MED ORDER — SODIUM CHLORIDE 0.9 % IV BOLUS (SEPSIS)
1000.0000 mL | Freq: Once | INTRAVENOUS | Status: AC
  Administered 2016-03-23: 1000 mL via INTRAVENOUS

## 2016-03-23 MED ORDER — HYDROMORPHONE HCL 1 MG/ML IJ SOLN
0.5000 mg | Freq: Once | INTRAMUSCULAR | Status: AC
  Administered 2016-03-23: 0.5 mg via INTRAVENOUS

## 2016-03-23 MED ORDER — CEPHALEXIN 500 MG PO CAPS
500.0000 mg | ORAL_CAPSULE | Freq: Once | ORAL | Status: AC
  Administered 2016-03-23: 500 mg via ORAL
  Filled 2016-03-23: qty 1

## 2016-03-23 MED ORDER — HYDROMORPHONE HCL 1 MG/ML IJ SOLN
INTRAMUSCULAR | Status: AC
  Administered 2016-03-23: 0.5 mg via INTRAVENOUS
  Filled 2016-03-23: qty 1

## 2016-03-23 MED ORDER — OXYCODONE-ACETAMINOPHEN 7.5-325 MG PO TABS
2.0000 | ORAL_TABLET | ORAL | Status: DC | PRN
  Administered 2016-03-23: 1 via ORAL
  Administered 2016-03-24: 2 via ORAL
  Filled 2016-03-23: qty 2
  Filled 2016-03-23: qty 1

## 2016-03-23 MED ORDER — ONDANSETRON HCL 4 MG PO TABS
4.0000 mg | ORAL_TABLET | Freq: Four times a day (QID) | ORAL | Status: DC | PRN
  Filled 2016-03-23: qty 1

## 2016-03-23 MED ORDER — IOPAMIDOL (ISOVUE-300) INJECTION 61%
15.0000 mL | INTRAVENOUS | Status: AC
  Administered 2016-03-23 (×2): 15 mL via ORAL

## 2016-03-23 MED ORDER — HEPARIN SODIUM (PORCINE) 5000 UNIT/ML IJ SOLN
5000.0000 [IU] | Freq: Three times a day (TID) | INTRAMUSCULAR | Status: DC
  Administered 2016-03-23 – 2016-03-24 (×3): 5000 [IU] via SUBCUTANEOUS
  Filled 2016-03-23 (×4): qty 1

## 2016-03-23 MED ORDER — HYDROMORPHONE HCL 1 MG/ML IJ SOLN
0.5000 mg | INTRAMUSCULAR | Status: DC | PRN
  Administered 2016-03-23 – 2016-03-25 (×5): 0.5 mg via INTRAVENOUS
  Filled 2016-03-23 (×5): qty 0.5

## 2016-03-23 MED ORDER — LABETALOL HCL 100 MG PO TABS
100.0000 mg | ORAL_TABLET | Freq: Two times a day (BID) | ORAL | Status: DC
  Administered 2016-03-23 – 2016-03-25 (×4): 100 mg via ORAL
  Filled 2016-03-23 (×6): qty 1

## 2016-03-23 MED ORDER — IOPAMIDOL (ISOVUE-300) INJECTION 61%
100.0000 mL | Freq: Once | INTRAVENOUS | Status: AC | PRN
  Administered 2016-03-23: 100 mL via INTRAVENOUS

## 2016-03-23 MED ORDER — DIPHENHYDRAMINE HCL 25 MG PO CAPS
50.0000 mg | ORAL_CAPSULE | Freq: Four times a day (QID) | ORAL | Status: DC | PRN
  Administered 2016-03-23 – 2016-03-24 (×3): 50 mg via ORAL
  Filled 2016-03-23 (×3): qty 2

## 2016-03-23 NOTE — ED Triage Notes (Signed)
Pt to ed with c/o swelling, redness, and blisters around umbilicus post gallbladder removal on Wednesday.

## 2016-03-23 NOTE — Progress Notes (Addendum)
S/p lap chole POD # 3 Ecchymosis around umbilical incision She is taking PO, some nausea no emesis VSS LFT went up CT scan reviewed no biliary dilation and no collections  PE NAD Abd: umbilical incision w some ecchymosis and blistering. TTP. No peritonitis   A/P inc LFT, will trend. IF persistent elevation may consider HIDA to r/o leak vs retained stone in CBD No need for emergent intervention at this time Umbilical wound resembles an allergic reaction but this might be a resolving ecchymosis. 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

## 2016-03-23 NOTE — ED Provider Notes (Signed)
[facility] Emergency Department Provider Note ____________________________________________   I have reviewed the triage vital signs and the triage nursing note.  HISTORY  Chief Complaint Abdominal Pain   Historian Patient  HPI Anita Massey is a 36 y.o. female who had laparoscopic cholecystectomy as a scheduled surgery with Dr. Dahlia Byes on Wednesday. She's been taking Percocet at home. She called the on-call number because she was having pain and redness and blistering around her umbilicus, one of the surgical sites. She states the pain is about 7 or 8 out of 10 and located right around the umbilicus at the site that is blistering.  States that she is also having some mild right upper quadrant pain, mild.    Past Medical History:  Diagnosis Date  . ANA positive 09/04/2015   Overview:  Low titer ANA 1/80  . Anemia   . Anxiety state, unspecified   . 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 Clotting disorder ([place])   . Contact dermatitis and other eczema, due to unspecified cause   . Dysuria   . Essential hypertension, malignant 03/01/2013  . GERD (gastroesophageal reflux disease)    OCC  . Headache    MIGRAINES  . Heart palpitations   . 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 Hypertension   . Multiple joint pain 09/04/2015  . Obesity   . Other psoriasis   . Palpitations   . Rosacea   . Syncope and collapse   . Undiagnosed cardiac murmurs   . Unspecified essential hypertension     Patient Active Problem List   Diagnosis Date Noted  . Abdominal pain 03/23/2016  . Cholelithiasis without cholecystitis   . ANA positive 09/04/2015  . Multiple joint pain 09/04/2015  . Anxiety 08/15/2015  . Rosacea 08/15/2015  . Dysuria 04/29/2013  . Hematuria 04/29/2013  . Essential hypertension, malignant 03/01/2013  . Palpitations 04/09/2011    Past Surgical History:  Procedure Laterality Date  . APPENDECTOMY  2009   [facility]   . CESAREAN SECTION  2016  . CHOLECYSTECTOMY N/A 03/20/2016   Procedure: LAPAROSCOPIC CHOLECYSTECTOMY;  Surgeon: Jules Husbands, MD;  Location: [facility] ORS;  Service: General;  Laterality: N/A;  . DILATION AND CURETTAGE OF UTERUS      Prior to Admission medications   Medication Sig Start Date End Date Taking? Authorizing Provider  aspirin-acetaminophen-caffeine (EXCEDRIN MIGRAINE) [phone] MG tablet Take 1 tablet by mouth every 6 (six) hours as needed for headache.    Historical Provider, MD  Calcium Carbonate-Simethicone (ALKA-SELTZER HEARTBURN + GAS PO) Take 1 tablet by mouth as needed.    Historical Provider, MD  doxycycline (VIBRAMYCIN) 100 MG capsule Take 100 mg by mouth daily as needed (rosacea).     Historical Provider, MD  ibuprofen (ADVIL,MOTRIN) 200 MG tablet Take 200-400 mg by mouth every 6 (six) hours as needed for mild pain (depends on pain level if takes 1-2 tablets).    Historical Provider, MD  labetalol (NORMODYNE) 100 MG tablet Take 100 mg by mouth 2 (two) times daily.    Historical Provider, MD  ondansetron (ZOFRAN) 4 MG tablet Take 1 tablet (4 mg total) by mouth every 6 (six) hours as needed for nausea or vomiting. 03/07/16   Diego F Pabon, MD  ondansetron (ZOFRAN) 4 MG tablet Take 1 tablet (4 mg total) by mouth every 6 (six) hours as needed for nausea or vomiting. 03/20/16   Diego Sarita Haver, MD  oxyCODONE-acetaminophen (PERCOCET) 7.5-325  MG tablet Take 2 tablets by mouth every 4 (four) hours as needed for moderate pain or severe pain (1 for moderate pain 2 tabs for severe pain). 03/20/16   Diego F Pabon, MD  oxyCODONE-acetaminophen (ROXICET) 5-325 MG tablet Take 1 tablet by mouth every 4 (four) hours as needed for severe pain. 02/17/16   Paulette Blanch, MD  Pediatric Multivit-Minerals-C (RA GUMMY VITAMINS & MINERALS) CHEW Chew 2 tablets by mouth daily. Women's gummies    Historical Provider, MD  promethazine (PHENERGAN) 12.5 MG suppository Place 1 suppository (12.5 mg total) rectally every 6  (six) hours as needed for nausea or vomiting. Patient not taking: Reported on 03/11/2016 01/12/16   Daymon Larsen, MD  promethazine (PHENERGAN) 12.5 MG tablet Take 1 tablet (12.5 mg total) by mouth every 6 (six) hours as needed for nausea or vomiting. Patient not taking: Reported on 03/11/2016 01/12/16   Daymon Larsen, MD  sertraline (ZOLOFT) 50 MG tablet Take 50 mg by mouth every morning.     Historical Provider, MD    Allergies  Allergen Reactions  . Biaxin [Clarithromycin] Hives  . Toradol [Ketorolac Tromethamine]     Chest pain   . Morphine And Related     Chest Pain   . Tape Rash    Paper tape is fine    Family History  Problem Relation Age of Onset  . Mitral valve prolapse Mother   . Arrhythmia Mother   . Hypertension Mother   . Heart disease Maternal Grandfather   . Lung cancer Maternal Grandfather     Social History Social History  Substance Use Topics  . Smoking status: Former Smoker    Packs/day: 0.25    Years: 1.00    Types: Cigarettes    Quit date: 03/13/1998  . Smokeless tobacco: Never Used  . Alcohol use No    Review of Systems  Constitutional: Negative for fever. Eyes: Negative for visual changes. ENT: Negative for sore throat. Cardiovascular: Negative for chest pain. Respiratory: Negative for shortness of breath. Gastrointestinal:No vomiting or diarrhea.. Genitourinary: Negative for dysuria. Musculoskeletal: Negative for back pain. Skin: Skin rash as per history of present illness. Neurological: Negative for headache. 10 point Review of Systems otherwise negative ____________________________________________   PHYSICAL EXAM:  VITAL SIGNS: ED Triage Vitals  Enc Vitals Group     BP 03/23/16 0808 129/85     Pulse Rate 03/23/16 0808 64     Resp 03/23/16 0808 16     Temp 03/23/16 0808 97.6 F (36.4 C)     Temp Source 03/23/16 0808 Oral     SpO2 03/23/16 0808 98 %     Weight 03/23/16 0809 178 lb (80.7 kg)     Height --      Head  Circumference --      Peak Flow --      Pain Score 03/23/16 0809 6     Pain Loc --      Pain Edu? --      Excl. in [place]? --      Constitutional: Alert and oriented. Well appearing and in no distress. HEENT   Head: Normocephalic and atraumatic.      Eyes: Conjunctivae are normal. PERRL. Normal extraocular movements.      Ears:         Nose: No congestion/rhinnorhea.   Mouth/Throat: Mucous membranes are moist.   Neck: No stridor. Cardiovascular/Chest: Normal rate, regular rhythm.  No murmurs, rubs, or gallops. Respiratory: Normal respiratory effort without tachypnea  nor retractions. Breath sounds are clear and equal bilaterally. No wheezes/rales/rhonchi. Gastrointestinal: Soft. No distention.  Very mild tenderness in the right side abdomen without focal tenderness in the right upper quadrant. She does have tenderness at the skin site around the umbilicus where there is some blistering and ecchymosis and erythema consistent with cellulitis. Genitourinary/rectal:Deferred Musculoskeletal: Nontender with normal range of motion in all extremities. No joint effusions.  No lower extremity tenderness.  No edema. Neurologic:  Normal speech and language. No gross or focal neurologic deficits are appreciated. Skin:  Skin is warm, dry and intact. No rash noted. Psychiatric: Mood and affect are normal. Speech and behavior are normal. Patient exhibits appropriate insight and judgment.   ____________________________________________  LABS (pertinent positives/negatives)  Labs Reviewed  CBC WITH DIFFERENTIAL/PLATELET - Abnormal; Notable for the following:       Result Value   Monocytes Absolute 1.0 (*)    All other components within normal limits  COMPREHENSIVE METABOLIC PANEL - Abnormal; Notable for the following:    AST 267 (*)    ALT 364 (*)    Alkaline Phosphatase 192 (*)    Total Bilirubin 1.3 (*)    All other components within normal limits  HIV ANTIBODY (ROUTINE TESTING)  CBC   CREATININE, SERUM    ____________________________________________    EKG I, Lisa Roca, MD, the attending physician have personally viewed and interpreted all ECGs.  None ____________________________________________  RADIOLOGY All Xrays were viewed by me. Imaging interpreted by Radiologist.  IMPRESSION: Status post cholecystectomy.  Postsurgical changes in the anterior abdominal wall. No drainable fluid collection/ abscess.  1.5 cm low-density left adrenal nodule, previously 8 mm in 2011, technically indeterminate but likely reflecting a benign adrenal adenoma.  Additional ancillary findings as above. __________________________________________  PROCEDURES  Procedure(s) performed: None  Critical Care performed: None  ____________________________________________   ED COURSE / ASSESSMENT AND PLAN  Pertinent labs & imaging results that were available during my care of the patient were reviewed by me and considered in my medical decision making (see chart for details).    Anita Massey is here after surgery with one incision site looking cellulitic. She is overall well-appearing, but does have some additional abdominal pain is in the right side as opposed to the most significant pain is right at the site of the cellulitis/ecchymosis. Given the blistering, as well as a little concerned about possibility of adhesive allergic reaction, but it is not at the other sites.  Dr. Burt Knack was able to come see the patient and after seeing elevated LFTs, did recommend pursuing CT imaging.    CT scan without any evidence of drainable abscess. However he is at this point recommending IV antibiotics and admission to the hospital.  No evidence of sepsis clinically.    CONSULTATIONS:  Dr. Burt Knack, surgery, initially saw the patient in the ED, recommended pursuing CT scan given her elevated LFTs. Upon review the CT scan, I elected to give IV antibiotics as per Dr. Antionette Char order and  admit.   Patient / Family / Caregiver informed of clinical course, medical decision-making process, and agree with plan.   ___________________________________________   FINAL CLINICAL IMPRESSION(S) / ED DIAGNOSES   Final diagnoses:  Right upper quadrant abdominal pain  Cellulitis, abdominal wall              Note: This dictation was prepared with Dragon dictation. Any transcriptional errors that result from this process are unintentional    Lisa Roca, MD 03/23/16 1353

## 2016-03-23 NOTE — ED Notes (Signed)
Pt resting in bed drinking contrast, awake and alert in no acute distress

## 2016-03-23 NOTE — Consult Note (Signed)
Surgical Consultation  03/23/2016  Anita Massey is an 36 y.o. female.   CC: Abdominal pain  HPI: This patient several days status post laparoscopic cholecystectomy. She describes abdominal pain in the periumbilical area with some weeping from her wound and discoloration. She called me on the phone earlier today and I suggested she come to the emergency room to be evaluated. Tells no nausea or vomiting but has a poor appetite.  Past Medical History:  Diagnosis Date  . ANA positive 09/04/2015   Overview:  Low titer ANA 1/80  . Anemia   . Anxiety state, unspecified   . 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 Clotting disorder ([place])   . Contact dermatitis and other eczema, due to unspecified cause   . Dysuria   . Essential hypertension, malignant 03/01/2013  . GERD (gastroesophageal reflux disease)    OCC  . Headache    MIGRAINES  . Heart palpitations   . 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 Hypertension   . Multiple joint pain 09/04/2015  . Obesity   . Other psoriasis   . Palpitations   . Rosacea   . Syncope and collapse   . Undiagnosed cardiac murmurs   . Unspecified essential hypertension     Past Surgical History:  Procedure Laterality Date  . APPENDECTOMY  2009   [facility]  . CESAREAN SECTION  2016  . CHOLECYSTECTOMY N/A 03/20/2016   Procedure: LAPAROSCOPIC CHOLECYSTECTOMY;  Surgeon: Jules Husbands, MD;  Location: [facility] ORS;  Service: General;  Laterality: N/A;  . DILATION AND CURETTAGE OF UTERUS      Family History  Problem Relation Age of Onset  . Mitral valve prolapse Mother   . Arrhythmia Mother   . Hypertension Mother   . Heart disease Maternal Grandfather   . Lung cancer Maternal Grandfather     Social History:  reports that she quit smoking about 18 years ago. Her smoking use included Cigarettes. She has a 0.25 pack-year smoking history. She has never used smokeless tobacco. She reports that she does not drink alcohol or use  drugs.  Allergies:  Allergies  Allergen Reactions  . Biaxin [Clarithromycin] Hives  . Toradol [Ketorolac Tromethamine]     Chest pain   . Morphine And Related     Chest Pain   . Tape Rash    Paper tape is fine    Medications reviewed.   Review of Systems:   Review of Systems  Constitutional: Negative for chills and fever.  Gastrointestinal: Positive for abdominal pain. Negative for nausea and vomiting.     Physical Exam:  BP (!) 150/87   Pulse 65   Temp 97.6 F (36.4 C) (Oral)   Resp 16   Wt 178 lb (80.7 kg)   LMP 03/06/2016 (Approximate)   SpO2 95%   BMI 32.56 kg/m   Physical Exam  Constitutional: She is well-developed, well-nourished, and in no distress. No distress.  Eyes: Right eye exhibits no discharge. Left eye exhibits no discharge. No scleral icterus.  Abdominal: Soft. She exhibits no distension and no mass. There is tenderness. There is no rebound and no guarding.  Tenderness around the periumbilical area with some mild blistering and discoloration suggestive of ecchymosis from the wound itself. Other incisions are clean dry without erythema or ecchymosis and Dermabond in place  Musculoskeletal: She exhibits no edema.  Skin: Skin is warm and dry. No rash noted. She is not diaphoretic. No erythema.  Vitals reviewed.     Results for orders placed or performed during the hospital encounter of 03/23/16 (from the past 48 hour(s))  CBC with Differential     Status: Abnormal   Collection Time: 03/23/16  9:12 AM  Result Value Ref Range   WBC 9.3 3.6 - 11.0 K/uL   RBC 4.39 3.80 - 5.20 MIL/uL   Hemoglobin 12.8 12.0 - 16.0 g/dL   HCT 37.6 35.0 - 47.0 %   MCV 85.6 80.0 - 100.0 fL   MCH 29.1 26.0 - 34.0 pg   MCHC 34.0 32.0 - 36.0 g/dL   RDW 13.8 11.5 - 14.5 %   Platelets 279 150 - 440 K/uL   Neutrophils Relative % 57 %   Neutro Abs 5.2 1.4 - 6.5 K/uL   Lymphocytes Relative 31 %   Lymphs Abs 2.9 1.0 - 3.6 K/uL   Monocytes Relative 11 %   Monocytes  Absolute 1.0 (H) 0.2 - 0.9 K/uL   Eosinophils Relative 1 %   Eosinophils Absolute 0.1 0 - 0.7 K/uL   Basophils Relative 0 %   Basophils Absolute 0.0 0 - 0.1 K/uL  Comprehensive metabolic panel     Status: Abnormal   Collection Time: 03/23/16  9:12 AM  Result Value Ref Range   Sodium 136 135 - 145 mmol/L   Potassium 3.6 3.5 - 5.1 mmol/L   Chloride 104 101 - 111 mmol/L   CO2 26 22 - 32 mmol/L   Glucose, Bld 99 65 - 99 mg/dL   BUN 10 6 - 20 mg/dL   Creatinine, Ser 0.66 0.44 - 1.00 mg/dL   Calcium 9.7 8.9 - 10.3 mg/dL   Total Protein 7.2 6.5 - 8.1 g/dL   Albumin 3.8 3.5 - 5.0 g/dL   AST 267 (H) 15 - 41 U/L   ALT 364 (H) 14 - 54 U/L    Comment: RESULT CONFIRMED BY MANUAL DILUTION. SGD   Alkaline Phosphatase 192 (H) 38 - 126 U/L   Total Bilirubin 1.3 (H) 0.3 - 1.2 mg/dL   GFR calc non Af Amer >60 >60 mL/min   GFR calc Af Amer >60 >60 mL/min    Comment: (NOTE) The eGFR has been calculated using the CKD EPI equation. This calculation has not been validated in all clinical situations. eGFR's persistently <60 mL/min signify possible Chronic Kidney Disease.    Anion gap 6 5 - 15   No results found.  Assessment/Plan:  Initially the bleeding was that this was an allergic reaction however on exam her other wounds are clean and dissimilar in nature. They looked normal. This looks more like ecchymosis from a hematoma and there is no sign of infection.  With these findings initially I suggested the patient be started on oral antibiotics and discharged. However at this point her liver function tests have come back and are elevated but she has a normal white blood cell count without a mind of recommended a CT scan to look at the umbilicus and evaluate her gallbladder fossa as well. I've communicated this to Dr. Reita Cliche and will follow up with the CT findings.  Florene Glen, MD, FACS

## 2016-03-23 NOTE — H&P (Signed)
Anita Massey is an 36 y.o. female.    Chief Complaint: Abdominal pain  HPI: This patient several days status post laparoscopic cholecystectomy who is doing well at home until today when she noticed more abdominal pain in the periumbilical area she has no right upper quadrant pain she's had some nausea but no emesis and no fevers or chills. She called this morning stating the above and I asked her to come to the emergency room to be evaluated as she was worried about her wound as well.  She was evaluated in the emergency room and there is concern about the wound and a CT scan was obtained showing some inflammation in the area but no drainable fluid collection and no sign of bowel injury.  Past Medical History:  Diagnosis Date  . ANA positive 09/04/2015   Overview:  Low titer ANA 1/80  . Anemia   . Anxiety state, unspecified   . Blood dyscrasia    PROTHROMBIN G 23536 HETEROZYGOSITY (FACTOR 2)  . Clotting disorder ([place])   . Contact dermatitis and other eczema, due to unspecified cause   . Dysuria   . Essential hypertension, malignant 03/01/2013  . GERD (gastroesophageal reflux disease)    OCC  . Headache    MIGRAINES  . Heart palpitations   . 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)   . Hypertension   . Multiple joint pain 09/04/2015  . Obesity   . Other psoriasis   . Palpitations   . Rosacea   . Syncope and collapse   . Undiagnosed cardiac murmurs   . Unspecified essential hypertension     Past Surgical History:  Procedure Laterality Date  . APPENDECTOMY  2009   [facility]  . CESAREAN SECTION  2016  . CHOLECYSTECTOMY N/A 03/20/2016   Procedure: LAPAROSCOPIC CHOLECYSTECTOMY;  Surgeon: Jules Husbands, MD;  Location: [facility] ORS;  Service: General;  Laterality: N/A;  . DILATION AND CURETTAGE OF UTERUS      Family History  Problem Relation Age of Onset  . Mitral valve prolapse Mother   . Arrhythmia Mother   . Hypertension Mother   . Heart disease Maternal Grandfather   . Lung  cancer Maternal Grandfather    Social History:  reports that she quit smoking about 18 years ago. Her smoking use included Cigarettes. She has a 0.25 pack-year smoking history. She has never used smokeless tobacco. She reports that she does not drink alcohol or use drugs.  Allergies:  Allergies  Allergen Reactions  . Biaxin [Clarithromycin] Hives  . Toradol [Ketorolac Tromethamine]     Chest pain   . Morphine And Related     Chest Pain   . Tape Rash    Paper tape is fine     (Not in a hospital admission)   Review of Systems  Constitutional: Negative for chills, fever and weight loss.  HENT: Negative.   Eyes: Negative.   Respiratory: Negative.   Cardiovascular: Negative.   Gastrointestinal: Positive for abdominal pain and nausea. Negative for constipation, diarrhea, heartburn and vomiting.  Genitourinary: Negative.   Musculoskeletal: Negative.   Skin: Negative.   Neurological: Negative.   Endo/Heme/Allergies: Negative.   Psychiatric/Behavioral: Negative.      Physical Exam:  BP (!) 160/87   Pulse 75   Temp 97.6 F (36.4 C) (Oral)   Resp 15   Wt 178 lb (80.7 kg)   LMP 03/06/2016 (Approximate) Comment: neg preg 03/20/16  SpO2 96%   BMI 32.56 kg/m   Physical Exam  Constitutional: She is oriented to person, place, and time and well-developed, well-nourished, and in no distress. No distress.  HENT:  Head: Normocephalic and atraumatic.  Eyes: Pupils are equal, round, and reactive to light. Right eye exhibits no discharge. Left eye exhibits no discharge. No scleral icterus.  Neck: Normal range of motion.  Cardiovascular: Normal rate, regular rhythm and normal heart sounds.   Pulmonary/Chest: Effort normal. No respiratory distress. She has no wheezes. She has no rales.  Abdominal: Soft. She exhibits no distension. There is tenderness. There is no rebound and no guarding.  Early ecchymosis of the periumbilical area with Dermabond in place no erythema and no drainage  there are a few serous blisters without purulence  Musculoskeletal: Normal range of motion. She exhibits no edema or tenderness.  Lymphadenopathy:    She has no cervical adenopathy.  Neurological: She is alert and oriented to person, place, and time.  Skin: Skin is warm and dry. No rash noted. She is not diaphoretic. No erythema.  Vitals reviewed.       Results for orders placed or performed during the hospital encounter of 03/23/16 (from the past 48 hour(s))  CBC with Differential     Status: Abnormal   Collection Time: 03/23/16  9:12 AM  Result Value Ref Range   WBC 9.3 3.6 - 11.0 K/uL   RBC 4.39 3.80 - 5.20 MIL/uL   Hemoglobin 12.8 12.0 - 16.0 g/dL   HCT 37.6 35.0 - 47.0 %   MCV 85.6 80.0 - 100.0 fL   MCH 29.1 26.0 - 34.0 pg   MCHC 34.0 32.0 - 36.0 g/dL   RDW 13.8 11.5 - 14.5 %   Platelets 279 150 - 440 K/uL   Neutrophils Relative % 57 %   Neutro Abs 5.2 1.4 - 6.5 K/uL   Lymphocytes Relative 31 %   Lymphs Abs 2.9 1.0 - 3.6 K/uL   Monocytes Relative 11 %   Monocytes Absolute 1.0 (H) 0.2 - 0.9 K/uL   Eosinophils Relative 1 %   Eosinophils Absolute 0.1 0 - 0.7 K/uL   Basophils Relative 0 %   Basophils Absolute 0.0 0 - 0.1 K/uL  Comprehensive metabolic panel     Status: Abnormal   Collection Time: 03/23/16  9:12 AM  Result Value Ref Range   Sodium 136 135 - 145 mmol/L   Potassium 3.6 3.5 - 5.1 mmol/L   Chloride 104 101 - 111 mmol/L   CO2 26 22 - 32 mmol/L   Glucose, Bld 99 65 - 99 mg/dL   BUN 10 6 - 20 mg/dL   Creatinine, Ser 0.66 0.44 - 1.00 mg/dL   Calcium 9.7 8.9 - 10.3 mg/dL   Total Protein 7.2 6.5 - 8.1 g/dL   Albumin 3.8 3.5 - 5.0 g/dL   AST 267 (H) 15 - 41 U/L   ALT 364 (H) 14 - 54 U/L    Comment: RESULT CONFIRMED BY MANUAL DILUTION. SGD   Alkaline Phosphatase 192 (H) 38 - 126 U/L   Total Bilirubin 1.3 (H) 0.3 - 1.2 mg/dL   GFR calc non Af Amer >60 >60 mL/min   GFR calc Af Amer >60 >60 mL/min    Comment: (NOTE) The eGFR has been calculated using the CKD  EPI equation. This calculation has not been validated in all clinical situations. eGFR's persistently <60 mL/min signify possible Chronic Kidney Disease.    Anion gap 6 5 - 15   Ct Abdomen Pelvis W Contrast  Result Date: 03/23/2016 CLINICAL DATA:  Status post cholecystectomy 3 days ago, with redness and inflammation of wound site EXAM: CT ABDOMEN AND PELVIS WITH CONTRAST TECHNIQUE: Multidetector CT imaging of the abdomen and pelvis was performed using the standard protocol following bolus administration of intravenous contrast. CONTRAST:  100 mL Isovue 300 IV COMPARISON:  CT abdomen/ pelvis dated 06/09/2009 FINDINGS: Lower chest: Mild linear scarring/ atelectasis in the bilateral lower lobes. Hepatobiliary: Liver is within normal limits. Status post cholecystectomy. No intrahepatic or extrahepatic ductal dilatation. Pancreas: Within normal limits. Spleen: Within normal limits. Adrenals/Urinary Tract: 1.5 cm low-density left adrenal nodule (series 2/ image 25), previously 8 mm, technically indeterminate but likely reflecting a benign adrenal adenoma. Right adrenal gland is within normal limits. Kidneys are within normal limits.  No hydronephrosis. Bladder is within normal limits. Stomach/Bowel: Stomach is within normal limits. No evidence of bowel obstruction. Prior appendectomy. Vascular/Lymphatic: No evidence of abdominal aortic aneurysm. No suspicious abdominopelvic lymphadenopathy. Reproductive: Uterus is retroverted and grossly unremarkable. Bilateral ovaries are within normal limits. Other: No abdominopelvic ascites. Postsurgical changes in the anterior abdominal wall. No drainable fluid collection/ abscess. Musculoskeletal: Mild degenerative changes of the visualized thoracolumbar spine. IMPRESSION: Status post cholecystectomy. Postsurgical changes in the anterior abdominal wall. No drainable fluid collection/ abscess. 1.5 cm low-density left adrenal nodule, previously 8 mm in 2011, technically  indeterminate but likely reflecting a benign adrenal adenoma. Additional ancillary findings as above. Electronically Signed   By: Julian Hy M.D.   On: 03/23/2016 13:19     Assessment/Plan  This patient status post laparoscopic cholecystectomy a few days ago who presents with wound ecchymosis and tenderness without signs of bowel injury or incarcerated hernia etc. but she also has elevated liver function tests. With these 2 discordant findings unexpected in the postoperative time period as well as with her nausea and abdominal pain of recommended admitting the patient to the hospital for observation I will start IV antibiotics although I do not believe that she has a wound infection. I would like to also repeat her liver function tests and a lipase level and reexamine the patient I have reviewed her operative report as well as previous studies. This was discussed with the emergency room physician and with the patient and I will discuss this with Dr. Dahlia Byes tonight.  Florene Glen, MD, FACS

## 2016-03-24 LAB — CBC
HCT: 36.5 % (ref 35.0–47.0)
Hemoglobin: 12.7 g/dL (ref 12.0–16.0)
MCH: 30.2 pg (ref 26.0–34.0)
MCHC: 34.8 g/dL (ref 32.0–36.0)
MCV: 86.9 fL (ref 80.0–100.0)
PLATELETS: 268 10*3/uL (ref 150–440)
RBC: 4.2 MIL/uL (ref 3.80–5.20)
RDW: 13.7 % (ref 11.5–14.5)
WBC: 7 10*3/uL (ref 3.6–11.0)

## 2016-03-24 LAB — COMPREHENSIVE METABOLIC PANEL
ALK PHOS: 186 U/L — AB (ref 38–126)
ALT: 289 U/L — AB (ref 14–54)
AST: 140 U/L — AB (ref 15–41)
Albumin: 3.6 g/dL (ref 3.5–5.0)
Anion gap: 7 (ref 5–15)
BUN: 6 mg/dL (ref 6–20)
CALCIUM: 9.2 mg/dL (ref 8.9–10.3)
CHLORIDE: 106 mmol/L (ref 101–111)
CO2: 26 mmol/L (ref 22–32)
Creatinine, Ser: 0.58 mg/dL (ref 0.44–1.00)
Glucose, Bld: 109 mg/dL — ABNORMAL HIGH (ref 65–99)
Potassium: 3.6 mmol/L (ref 3.5–5.1)
Sodium: 139 mmol/L (ref 135–145)
Total Bilirubin: 1.4 mg/dL — ABNORMAL HIGH (ref 0.3–1.2)
Total Protein: 6.8 g/dL (ref 6.5–8.1)

## 2016-03-24 LAB — PROTIME-INR
INR: 1.13
Prothrombin Time: 14.6 seconds (ref 11.4–15.2)

## 2016-03-24 LAB — LIPASE, BLOOD

## 2016-03-24 LAB — APTT: aPTT: 38 seconds — ABNORMAL HIGH (ref 24–36)

## 2016-03-24 MED ORDER — TRAMADOL HCL 50 MG PO TABS
50.0000 mg | ORAL_TABLET | Freq: Four times a day (QID) | ORAL | Status: DC | PRN
  Administered 2016-03-24 (×2): 50 mg via ORAL
  Filled 2016-03-24 (×2): qty 1

## 2016-03-24 MED ORDER — ZINC OXIDE 40 % EX OINT
TOPICAL_OINTMENT | Freq: Three times a day (TID) | CUTANEOUS | Status: DC
  Administered 2016-03-24 – 2016-03-25 (×4): via TOPICAL
  Filled 2016-03-24: qty 114

## 2016-03-24 MED ORDER — ALUM & MAG HYDROXIDE-SIMETH 200-200-20 MG/5ML PO SUSP: 30.0000 mL | ORAL | Status: DC | PRN

## 2016-03-24 MED ORDER — HYDROCORTISONE 1 % EX CREA
TOPICAL_CREAM | Freq: Two times a day (BID) | CUTANEOUS | Status: DC
  Administered 2016-03-24 – 2016-03-25 (×3): via TOPICAL
  Filled 2016-03-24: qty 28

## 2016-03-24 MED ORDER — DIPHENHYDRAMINE-ZINC ACETATE 2-0.1 % EX CREA: TOPICAL_CREAM | Freq: Three times a day (TID) | CUTANEOUS | Status: DC

## 2016-03-24 NOTE — Progress Notes (Signed)
CC: Heartburn Subjective: Patient's chief complaint today is heartburn but she continues to have periumbilical pain but it is improved. She is itching all over from the Percocet she believes. She states she's never had problems with Dilaudid in the past but has never tried tramadol.  Objective: Vital signs in last 24 hours: Temp:  [97.5 F (36.4 C)-98.4 F (36.9 C)] 98.4 F (36.9 C) (03/11 0415) Pulse Rate:  [64-95] 82 (03/11 0415) Resp:  [9-18] 18 (03/11 0415) BP: (129-163)/(72-92) 152/84 (03/11 0415) SpO2:  [95 %-99 %] 96 % (03/11 0415) Weight:  [175 lb 4.8 oz (79.5 kg)-178 lb (80.7 kg)] 175 lb 4.8 oz (79.5 kg) (03/10 1532) Last BM Date: 03/19/16  Intake/Output from previous day: 03/10 0701 - 03/11 0700 In: 2407 [I.V.:2207; IV Piggyback:200] Out: 3500 [Urine:3500] Intake/Output this shift: No intake/output data recorded.  Physical exam:  Abdominal exam is soft and nontender the 5 mm port sites appear to be showing signs of similar colored differential as the periumbilical site has done. This does not look like erythema as much as it is either early ecchymosis or no allergic reaction. There is no drainage no purulence  No icterus no jaundice  Lab Results: CBC   Recent Labs  03/23/16 0912 03/24/16 0337  WBC 9.3 7.0  HGB 12.8 12.7  HCT 37.6 36.5  PLT 279 268   BMET  Recent Labs  03/23/16 0912 03/24/16 0337  NA 136 139  K 3.6 3.6  CL 104 106  CO2 26 26  GLUCOSE 99 109*  BUN 10 6  CREATININE 0.66 0.58  CALCIUM 9.7 9.2   PT/INR  Recent Labs  03/24/16 0337  LABPROT 14.6  INR 1.13   ABG No results for input(s): PHART, HCO3 in the last 72 hours.  Invalid input(s): PCO2, PO2  Studies/Results: Ct Abdomen Pelvis W Contrast  Result Date: 03/23/2016 CLINICAL DATA:  Status post cholecystectomy 3 days ago, with redness and inflammation of wound site EXAM: CT ABDOMEN AND PELVIS WITH CONTRAST TECHNIQUE: Multidetector CT imaging of the abdomen and pelvis was  performed using the standard protocol following bolus administration of intravenous contrast. CONTRAST:  100 mL Isovue 300 IV COMPARISON:  CT abdomen/ pelvis dated 06/09/2009 FINDINGS: Lower chest: Mild linear scarring/ atelectasis in the bilateral lower lobes. Hepatobiliary: Liver is within normal limits. Status post cholecystectomy. No intrahepatic or extrahepatic ductal dilatation. Pancreas: Within normal limits. Spleen: Within normal limits. Adrenals/Urinary Tract: 1.5 cm low-density left adrenal nodule (series 2/ image 25), previously 8 mm, technically indeterminate but likely reflecting a benign adrenal adenoma. Right adrenal gland is within normal limits. Kidneys are within normal limits.  No hydronephrosis. Bladder is within normal limits. Stomach/Bowel: Stomach is within normal limits. No evidence of bowel obstruction. Prior appendectomy. Vascular/Lymphatic: No evidence of abdominal aortic aneurysm. No suspicious abdominopelvic lymphadenopathy. Reproductive: Uterus is retroverted and grossly unremarkable. Bilateral ovaries are within normal limits. Other: No abdominopelvic ascites. Postsurgical changes in the anterior abdominal wall. No drainable fluid collection/ abscess. Musculoskeletal: Mild degenerative changes of the visualized thoracolumbar spine. IMPRESSION: Status post cholecystectomy. Postsurgical changes in the anterior abdominal wall. No drainable fluid collection/ abscess. 1.5 cm low-density left adrenal nodule, previously 8 mm in 2011, technically indeterminate but likely reflecting a benign adrenal adenoma. Additional ancillary findings as above. Electronically Signed   By: Julian Hy M.D.   On: 03/23/2016 13:19    Anti-infectives: Anti-infectives    Start     Dose/Rate Route Frequency Ordered Stop   03/23/16 1400  ciprofloxacin (CIPRO)  IVPB 400 mg     400 mg 200 mL/hr over 60 Minutes Intravenous Every 12 hours 03/23/16 1348     03/23/16 1015  cephALEXin (KEFLEX) capsule 500 mg      500 mg Oral  Once 03/23/16 1010 03/23/16 1014      Assessment/Plan:  White blood cell count remains normal. Abdominal exam is changing to suggest that this is an allergic reaction. May also result in her LFT changes. It is not clear of what the allergy is but I will stop her Percocet as that is causing her itching we'll try tramadol today we'll advance diet  Florene Glen, MD, FACS  03/24/2016

## 2016-03-25 DIAGNOSIS — L500 Allergic urticaria: Secondary | ICD-10-CM | POA: Diagnosis not present

## 2016-03-25 LAB — CBC WITH DIFFERENTIAL/PLATELET
BASOS ABS: 0 10*3/uL (ref 0–0.1)
BASOS PCT: 1 %
Eosinophils Absolute: 0.1 10*3/uL (ref 0–0.7)
Eosinophils Relative: 2 %
HEMATOCRIT: 39.3 % (ref 35.0–47.0)
HEMOGLOBIN: 13.5 g/dL (ref 12.0–16.0)
LYMPHS PCT: 30 %
Lymphs Abs: 2.6 10*3/uL (ref 1.0–3.6)
MCH: 29.7 pg (ref 26.0–34.0)
MCHC: 34.2 g/dL (ref 32.0–36.0)
MCV: 86.7 fL (ref 80.0–100.0)
MONO ABS: 0.7 10*3/uL (ref 0.2–0.9)
Monocytes Relative: 8 %
NEUTROS ABS: 5.1 10*3/uL (ref 1.4–6.5)
NEUTROS PCT: 59 %
Platelets: 282 10*3/uL (ref 150–440)
RBC: 4.53 MIL/uL (ref 3.80–5.20)
RDW: 13.7 % (ref 11.5–14.5)
WBC: 8.5 10*3/uL (ref 3.6–11.0)

## 2016-03-25 LAB — COMPREHENSIVE METABOLIC PANEL
ALBUMIN: 3.8 g/dL (ref 3.5–5.0)
ALK PHOS: 206 U/L — AB (ref 38–126)
ALT: 214 U/L — ABNORMAL HIGH (ref 14–54)
AST: 69 U/L — AB (ref 15–41)
Anion gap: 7 (ref 5–15)
BILIRUBIN TOTAL: 1.1 mg/dL (ref 0.3–1.2)
BUN: 6 mg/dL (ref 6–20)
CO2: 25 mmol/L (ref 22–32)
CREATININE: 0.67 mg/dL (ref 0.44–1.00)
Calcium: 9.2 mg/dL (ref 8.9–10.3)
Chloride: 102 mmol/L (ref 101–111)
GFR calc Af Amer: >60 mL/min (ref >60)
Glucose, Bld: 149 mg/dL — ABNORMAL HIGH (ref 65–99)
POTASSIUM: 3.4 mmol/L — AB (ref 3.5–5.1)
Sodium: 134 mmol/L — ABNORMAL LOW (ref 135–145)
Total Protein: 7.3 g/dL (ref 6.5–8.1)

## 2016-03-25 LAB — HIV ANTIBODY (ROUTINE TESTING W REFLEX): HIV Screen 4th Generation wRfx: NONREACTIVE

## 2016-03-25 MED ORDER — POTASSIUM CHLORIDE CRYS ER 20 MEQ PO TBCR
40.0000 meq | EXTENDED_RELEASE_TABLET | Freq: Once | ORAL | Status: AC
  Administered 2016-03-25: 40 meq via ORAL
  Filled 2016-03-25: qty 2

## 2016-03-25 MED ORDER — OXYCODONE HCL 5 MG PO TABS
5.0000 mg | ORAL_TABLET | Freq: Four times a day (QID) | ORAL | 0 refills | Status: DC | PRN
Start: 2016-03-25 — End: 2016-04-03

## 2016-03-25 MED ORDER — HYDROCORTISONE 1 % EX CREA: TOPICAL_CREAM | Freq: Two times a day (BID) | CUTANEOUS | 0 refills | Status: DC

## 2016-03-25 MED ORDER — DIPHENHYDRAMINE HCL 25 MG PO CAPS: 25.0000 mg | ORAL_CAPSULE | Freq: Four times a day (QID) | ORAL | 0 refills | Status: DC | PRN

## 2016-03-25 MED ORDER — IBUPROFEN 600 MG PO TABS: 600.0000 mg | ORAL_TABLET | Freq: Three times a day (TID) | ORAL | 0 refills | Status: DC | PRN

## 2016-03-25 NOTE — Discharge Summary (Signed)
Patient ID: Anita Massey MRN: [id number] DOB/AGE: December 29, 1980 36 y.o.  Admit date: 03/23/2016 Discharge date: 03/25/2016   Discharge Diagnoses:  Active Problems:   Abdominal pain   Procedures:  None  Hospital Course:  Patient is s/p laparoscopic cholecystectomy with Dr. Dahlia Byes on 3/7 as an outpatient elective case.  She presented on 3/10 with urticaria and swelling around the laparoscopic incisions, believed to be due to an allergic reaction.  Her LFTs were mildly elevated, but CT scan did not reveal any abscess or CBD dilatation.  Her LFTs decreased on their own.  She was started on hydrocortisone ointment and benadryl.  Her soreness has improved.  Her blistering is under control and there are no open wounds at this time.  There is no evidence of infection.  She will be discharged to home.  On exam, she is in no acute distress, with stable vital signs.  Her incisions are clean and dry, with surrounding blistering and swelling at the incisions, but not spreading further.  There is no purulence.  Her abdomen is appropriately tender.  Consults: None  Disposition: 01-Home or Self Care  Discharge Instructions    Call MD for:  difficulty breathing, headache or visual disturbances    Complete by:  As directed    Call MD for:  persistant nausea and vomiting    Complete by:  As directed    Call MD for:  redness, tenderness, or signs of infection (pain, swelling, redness, odor or green/yellow discharge around incision site)    Complete by:  As directed    Call MD for:  severe uncontrolled pain    Complete by:  As directed    Call MD for:  temperature >100.4    Complete by:  As directed    Change dressing (specify)    Complete by:  As directed    1.  Apply hydrocortisone ointment to the blistered/swollen areas twice daily.  Cover with dry gauze and tape to keep covered. 2.  May apply Neosporin or Triple Antibiotic ointment as well if the blisters open.   Diet - low sodium heart healthy     Complete by:  As directed    Discharge instructions    Complete by:  As directed    1.  Patient may shower, but do not scrub wounds heavily and dab dry only. 2.  Do not submerge wounds in pool/tub.   Driving Restrictions    Complete by:  As directed    Do not drive while taking narcotics for pain control.   Increase activity slowly    Complete by:  As directed    Lifting restrictions    Complete by:  As directed    No heavy lifting of more than 10-15 lbs for 4 weeks.     Allergies as of 03/25/2016      Reactions   Biaxin [clarithromycin] Hives   Toradol [ketorolac Tromethamine]    Chest pain    Morphine And Related    Chest Pain    Tape Rash   Paper tape is fine      Medication List    STOP taking these medications   ondansetron 4 MG tablet Commonly known as:  ZOFRAN   oxyCODONE-acetaminophen 7.5-325 MG tablet Commonly known as:  PERCOCET     TAKE these medications   ALKA-SELTZER HEARTBURN + GAS PO Take 1 tablet by mouth as needed.   aspirin-acetaminophen-caffeine 250-250-65 MG tablet Commonly known as:  EXCEDRIN MIGRAINE Take 1 tablet by mouth  every 6 (six) hours as needed for headache.   diphenhydrAMINE 25 mg capsule Commonly known as:  BENADRYL Take 1 capsule (25 mg total) by mouth every 6 (six) hours as needed.   doxycycline 100 MG capsule Commonly known as:  VIBRAMYCIN Take 100 mg by mouth daily as needed (rosacea).   hydrocortisone cream 1 % Apply topically 2 (two) times daily.   ibuprofen 200 MG tablet Commonly known as:  ADVIL,MOTRIN Take 200-400 mg by mouth every 6 (six) hours as needed for mild pain (depends on pain level if takes 1-2 tablets). What changed:  Another medication with the same name was added. Make sure you understand how and when to take each.   ibuprofen 600 MG tablet Commonly known as:  ADVIL,MOTRIN Take 1 tablet (600 mg total) by mouth every 8 (eight) hours as needed for fever or mild pain. What changed:  You were already  taking a medication with the same name, and this prescription was added. Make sure you understand how and when to take each.   labetalol 100 MG tablet Commonly known as:  NORMODYNE Take 100 mg by mouth 2 (two) times daily.   oxyCODONE 5 MG immediate release tablet Commonly known as:  Oxy IR/ROXICODONE Take 1-2 tablets (5-10 mg total) by mouth every 6 (six) hours as needed for severe pain.   RA GUMMY VITAMINS & MINERALS Chew Chew 2 tablets by mouth daily. Women's gummies   sertraline 50 MG tablet Commonly known as:  ZOLOFT Take 50 mg by mouth every morning.      Follow-up [place], MD Follow up in 1 week(s).   Specialty:  General Surgery Why:  For wound check -- hives Contact information: [address] [place] [address] [phone]

## 2016-03-25 NOTE — Progress Notes (Signed)
Pt to be discharged per MD order. IV removed. Instructions reviewed with pt and all questions answered. Scripts given to pt. Dressing change performed and pt demonstrated teachback on how to maintain dressing and change daily. Will discharge in wheelchair.

## 2016-04-03 ENCOUNTER — Ambulatory Visit (INDEPENDENT_AMBULATORY_CARE_PROVIDER_SITE_OTHER): Payer: BLUE CROSS/BLUE SHIELD | Admitting: Surgery

## 2016-04-03 ENCOUNTER — Encounter: Payer: Self-pay | Admitting: Surgery

## 2016-04-03 VITALS — BP 156/87 | HR 68 | Temp 97.6°F | Ht 64.0 in | Wt 177.4 lb

## 2016-04-03 DIAGNOSIS — Z9049 Acquired absence of other specified parts of digestive tract: Secondary | ICD-10-CM | POA: Insufficient documentation

## 2016-04-03 DIAGNOSIS — Z09 Encounter for follow-up examination after completed treatment for conditions other than malignant neoplasm: Secondary | ICD-10-CM

## 2016-04-03 NOTE — Progress Notes (Signed)
04/03/2016  HPI: Patient is s/p laparoscopic cholecystectomy with Dr. Dahlia Byes on 3/7.  She was admitted to the hospital on 3/10 with abdominal pain and was noted to have a possible allergic reaction, with erythema and blistering of the incisions, particularly over the umbilical incision.  She was discharged on 3/12 with hydrocortisone ointment and benadryl prn.  She presents for follow up today.  Reports improving appetite, with some nausea and diarrhea after meals.  Denies any vomiting, chills, or pain.  Reports the blistering has improved and there is no drainage.  Vital signs: BP (!) 156/87   Pulse 68   Temp 97.6 F (36.4 C) (Oral)   Ht 5\' 4"  (1.626 m)   Wt 80.5 kg (177 lb 6.4 oz)   LMP 03/06/2016 (Approximate) Comment: neg preg 03/20/16  BMI 30.45 kg/m    Physical Exam: Constitutional: No acute distress Abdomen:  Soft, nondistended, nontender to palpation.  Incisions are clean, with some remaining induration from her allergic reaction, though significantly improved.  No purulence or cellulitis.  Over the umbilical incision, there is a small area where a blister opened up, non-infected.  Assessment/Plan: 36 yo female s/p laparoscopic cholecystectomy, with allergic reaction over incisions.  --Reassured the patient that the wounds will continue to improve.  She may stop using hydrocortisone over the wounds.  She may apply triple antibiotic ointment over the umbilical wound at the site where the blister opened up to prevent any infection.   --Patient is concerned about her diarrhea.  She reports that she had diarrhea before surgery as well and is unknown why she was having this.  She also had mildly elevated LFTs too.  Will send a referral for gastroenterology.  She had previously seen Dr. Tammi Klippel with GI. --Patient will follow up in 2 weeks for another wound check.   Melvyn Neth, [place]

## 2016-04-03 NOTE — Patient Instructions (Addendum)
We will send the referral to [facility]. Someone from their office will call you with an appointment.  Please do not lift anything over 15 pounds until 05/01/16. Please apply triple Antibiotic ointment to the incision area once daily and apply a dressing over the area. Please see your follow up appointment listed below.

## 2016-04-09 ENCOUNTER — Other Ambulatory Visit
Admission: RE | Admit: 2016-04-09 | Discharge: 2016-04-09 | Disposition: A | Discharge status: Home / Self Care | Payer: BLUE CROSS/BLUE SHIELD | Source: Ambulatory Visit | Attending: Gastroenterology | Admitting: Gastroenterology

## 2016-04-09 ENCOUNTER — Encounter: Payer: Self-pay | Admitting: Gastroenterology

## 2016-04-09 ENCOUNTER — Telehealth: Payer: Self-pay

## 2016-04-09 ENCOUNTER — Ambulatory Visit (INDEPENDENT_AMBULATORY_CARE_PROVIDER_SITE_OTHER): Payer: BLUE CROSS/BLUE SHIELD | Admitting: Gastroenterology

## 2016-04-09 ENCOUNTER — Other Ambulatory Visit: Payer: Self-pay

## 2016-04-09 VITALS — BP 109/70 | HR 76 | Temp 97.8°F

## 2016-04-09 DIAGNOSIS — R7989 Other specified abnormal findings of blood chemistry: Secondary | ICD-10-CM

## 2016-04-09 DIAGNOSIS — R197 Diarrhea, unspecified: Secondary | ICD-10-CM

## 2016-04-09 DIAGNOSIS — R945 Abnormal results of liver function studies: Secondary | ICD-10-CM

## 2016-04-09 DIAGNOSIS — K529 Noninfective gastroenteritis and colitis, unspecified: Secondary | ICD-10-CM | POA: Insufficient documentation

## 2016-04-09 DIAGNOSIS — R110 Nausea: Secondary | ICD-10-CM

## 2016-04-09 LAB — HEPATIC FUNCTION PANEL
ALT: 36 U/L (ref 14–54)
AST: 30 U/L (ref 15–41)
Albumin: 4.5 g/dL (ref 3.5–5.0)
Alkaline Phosphatase: 153 U/L — ABNORMAL HIGH (ref 38–126)
TOTAL PROTEIN: 8 g/dL (ref 6.5–8.1)
Total Bilirubin: 0.6 mg/dL (ref 0.3–1.2)

## 2016-04-09 LAB — TSH: TSH: 1.696 u[IU]/mL (ref 0.350–4.500)

## 2016-04-09 LAB — GAMMA GT: GGT: 75 U/L — ABNORMAL HIGH (ref 7–50)

## 2016-04-09 LAB — C-REACTIVE PROTEIN

## 2016-04-09 NOTE — Telephone Encounter (Signed)
Gastroenterology Pre-Procedure Review  Request Date: 04/16/16 Requesting Physician: Dr. Vicente Males  PATIENT REVIEW QUESTIONS: The patient responded to the following health history questions as indicated:    1. Are you having any GI issues? yes (nausea, diarrhea) 2. Do you have a personal history of Polyps? no 3. Do you have a family history of Colon Cancer or Polyps? yes (mother and grandmother: polyps) 4. Diabetes Mellitus? no 5. Joint replacements in the past 12 months?no 6. Major health problems in the past 3 months?yes (gallbladder removal) 7. Any artificial heart valves, MVP, or defibrillator?no    MEDICATIONS & ALLERGIES:    Patient reports the following regarding taking any anticoagulation/antiplatelet therapy:   Plavix, Coumadin, Eliquis, Xarelto, Lovenox, Pradaxa, Brilinta, or Effient? no Aspirin? no  Patient confirms/reports the following medications:  Current Outpatient Prescriptions  Medication Sig Dispense Refill  . aspirin-acetaminophen-caffeine (EXCEDRIN MIGRAINE) 250-250-65 MG tablet Take 1 tablet by mouth every 6 (six) hours as needed for headache.    . Calcium Carbonate-Simethicone (ALKA-SELTZER HEARTBURN + GAS PO) Take 1 tablet by mouth as needed.    . diphenhydrAMINE (BENADRYL) 25 mg capsule Take 1 capsule (25 mg total) by mouth every 6 (six) hours as needed. (Patient not taking: Reported on 04/09/2016) 30 capsule 0  . doxycycline (VIBRAMYCIN) 100 MG capsule Take 100 mg by mouth daily as needed (rosacea).     . hydrocortisone cream 1 % Apply topically 2 (two) times daily. 30 g 0  . ibuprofen (ADVIL,MOTRIN) 200 MG tablet Take 200-400 mg by mouth every 6 (six) hours as needed for mild pain (depends on pain level if takes 1-2 tablets).    Marland Kitchen ibuprofen (ADVIL,MOTRIN) 600 MG tablet Take 1 tablet (600 mg total) by mouth every 8 (eight) hours as needed for fever or mild pain. (Patient not taking: Reported on 04/09/2016) 30 tablet 0  . labetalol (NORMODYNE) 100 MG tablet Take 100 mg by  mouth 2 (two) times daily.    Marland Kitchen LORazepam (ATIVAN) 0.5 MG tablet TAKE 1 TABLET BY MOUTH EVERY 8 HOURS AS NEEDED FOR ANXIETY FOR UP TO 10 DAYS  0  . ondansetron (ZOFRAN-ODT) 4 MG disintegrating tablet DISSOLVE 1 TABLET ON THE TONGUE EVERY 8 HOURS AS NEEDED FOR NAUSEA OR VOMITING  0  . oxyCODONE (OXY IR/ROXICODONE) 5 MG immediate release tablet TAKE 1 TO 2 TABLETS BY MOUTH EVERY 6 HOURS AS NEEDED SEVERE PAIN  0  . oxyCODONE-acetaminophen (PERCOCET) 7.5-325 MG tablet TAKE 2 TABS EVERY 4 HOURS AS NEEDED FOR MODERATE OR SEVERE PAIN (1 FOR MODERATE, 2 FOR SEVERE PAIN)  0  . Pediatric Multivit-Minerals-C (RA GUMMY VITAMINS & MINERALS) CHEW Chew 2 tablets by mouth daily. Women's gummies    . promethazine (PHENERGAN) 12.5 MG tablet TAKE 1 TABLET (12.5 MG TOTAL) BY MOUTH EVERY 6 (SIX) HOURS AS NEEDED FOR NAUSEA.  0  . sertraline (ZOLOFT) 50 MG tablet Take 50 mg by mouth every morning.      No current facility-administered medications for this visit.     Patient confirms/reports the following allergies:  Allergies  Allergen Reactions  . Biaxin [Clarithromycin] Hives  . Toradol [Ketorolac Tromethamine]     Chest pain   . Morphine And Related     Chest Pain   . Tape Rash    Paper tape is fine    No orders of the defined types were placed in this encounter.   AUTHORIZATION INFORMATION Primary Insurance: 1D#: Group #:  Secondary Insurance: 1D#: Group #:  SCHEDULE INFORMATION: Date: 04/16/16 Time:  Location: [facility]

## 2016-04-09 NOTE — Progress Notes (Signed)
Gastroenterology Consultation  Referring Provider:     Tomasita Morrow, MD Primary Care Physician:  Marcello Fennel, MD Primary Gastroenterologist:  Dr. Jonathon Bellows  Reason for Consultation:     Diarrhea        HPI:   Anita Massey is a 36 y.o. y/o female referred for consultation & management  by Dr. Marcello Fennel, MD.    Recent cholecystectomy on 03/20/16 . She has also had elevated LFT's .CBC normal in 03/2016 liver function tests' were normal a month back and noted to be abnormal only 2 weeks back.   Component     Latest Ref Rng & Units 03/23/2016 03/24/2016 03/25/2016  AST     15 - 41 U/L 267 (H) 140 (H) 69 (H)  ALT     14 - 54 U/L 364 (H) 289 (H) 214 (H)  Alkaline Phosphatase     38 - 126 U/L 192 (H) 186 (H) 206 (H)  Total Bilirubin     0.3 - 1.2 mg/dL 1.3 (H) 1.4 (H) 1.1    Diarrhea :  Onset: MANY MONTHS    Number of bowel movements a day : 10 a day, usually after meals, preceeded by abdominal pain that is relived with defecation, grandmother and mother had polyps/  , same before and after cholecystectomy.    Color : yellow, green   Consistency:  watery Present status: continues    Shape of stool:  None    Weight loss:  Yes , 20 lbs  Prior colonoscopy:  None   Artificial sugars/sodas/chewing gum:  None    Bloating:  Yes   Gas:  Yes  Antibiotic use: after surgery - keflex   No celiac disease in family , no prior colonoscopy . She takes NSAID;s as needed  excedrin for headaches, a few times a month. No blood in the stool recently, in the past has seen blood in her stool, mixed in the stool.    First noted abnormality in LFT's in 2 weeks back  .  Alcohol use none   Drug use none Over the counter herbal supplements none  New medications zoloft  Abdominal pain all over the abdomen  Tattoos no  Military service no  Prior blood transfusion no  Incarceration no History of travel no  Family history of liver disease mother had fatty liver  Recent change in weight  -yes  Her stool was positive for norovirus in 02/2016   Past Medical History:  Diagnosis Date  . ANA positive 09/04/2015   Overview:  Low titer ANA 1/80  . Anemia   . Anxiety state, unspecified   . 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 Clotting disorder ([place])   . Contact dermatitis and other eczema, due to unspecified cause   . Dysuria   . Essential hypertension, malignant 03/01/2013  . GERD (gastroesophageal reflux disease)    OCC  . Headache    MIGRAINES  . Heart palpitations   . 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 Hypertension   . Multiple joint pain 09/04/2015  . Obesity   . Other psoriasis   . Palpitations   . Rosacea   . Syncope and collapse   . Undiagnosed cardiac murmurs   . Unspecified essential hypertension     Past Surgical History:  Procedure Laterality Date  . APPENDECTOMY  2009   [facility]  . CESAREAN SECTION  2016  . CHOLECYSTECTOMY N/A 03/20/2016  Procedure: LAPAROSCOPIC CHOLECYSTECTOMY;  Surgeon: Jules Husbands, MD;  Location: [facility] ORS;  Service: General;  Laterality: N/A;  . DILATION AND CURETTAGE OF UTERUS      Prior to Admission medications   Medication Sig Start Date End Date Taking? Authorizing Provider  aspirin-acetaminophen-caffeine (EXCEDRIN MIGRAINE) [phone] MG tablet Take 1 tablet by mouth every 6 (six) hours as needed for headache.   Yes Historical Provider, MD  Calcium Carbonate-Simethicone (ALKA-SELTZER HEARTBURN + GAS PO) Take 1 tablet by mouth as needed.   Yes Historical Provider, MD  doxycycline (VIBRAMYCIN) 100 MG capsule Take 100 mg by mouth daily as needed (rosacea).    Yes Historical Provider, MD  hydrocortisone cream 1 % Apply topically 2 (two) times daily. 03/25/16  Yes Olean Ree, MD  ibuprofen (ADVIL,MOTRIN) 200 MG tablet Take 200-400 mg by mouth every 6 (six) hours as needed for mild pain (depends on pain level if takes 1-2 tablets).   Yes Historical Provider, MD  labetalol (NORMODYNE) 100 MG  tablet Take 100 mg by mouth 2 (two) times daily.   Yes Historical Provider, MD  ondansetron (ZOFRAN-ODT) 4 MG disintegrating tablet DISSOLVE 1 TABLET ON THE TONGUE EVERY 8 HOURS AS NEEDED FOR NAUSEA OR VOMITING 02/17/16  Yes Historical Provider, MD  Pediatric Multivit-Minerals-C (RA GUMMY VITAMINS & MINERALS) CHEW Chew 2 tablets by mouth daily. Women's gummies   Yes Historical Provider, MD  sertraline (ZOLOFT) 50 MG tablet Take 50 mg by mouth every morning.    Yes Historical Provider, MD  diphenhydrAMINE (BENADRYL) 25 mg capsule Take 1 capsule (25 mg total) by mouth every 6 (six) hours as needed. Patient not taking: Reported on 04/09/2016 03/25/16   Olean Ree, MD  ibuprofen (ADVIL,MOTRIN) 600 MG tablet Take 1 tablet (600 mg total) by mouth every 8 (eight) hours as needed for fever or mild pain. Patient not taking: Reported on 04/09/2016 03/25/16   Olean Ree, MD  LORazepam (ATIVAN) 0.5 MG tablet TAKE 1 TABLET BY MOUTH EVERY 8 HOURS AS NEEDED FOR ANXIETY FOR UP TO 10 DAYS 01/24/16   Historical Provider, MD  oxyCODONE (OXY IR/ROXICODONE) 5 MG immediate release tablet TAKE 1 TO 2 TABLETS BY MOUTH EVERY 6 HOURS AS NEEDED SEVERE PAIN 03/25/16   Historical Provider, MD  oxyCODONE-acetaminophen (PERCOCET) 7.5-325  tablet TAKE 2 TABS EVERY 4 HOURS AS NEEDED FOR MODERATE OR SEVERE PAIN (1 FOR MODERATE, 2 FOR SEVERE PAIN) 03/20/16   Historical Provider, MD  promethazine (PHENERGAN) 12.5 MG tablet TAKE 1 TABLET (12.5 MG TOTAL) BY MOUTH EVERY 6 (SIX) HOURS AS NEEDED FOR NAUSEA. 01/24/16   Historical Provider, MD    Family History  Problem Relation Age of Onset  . Mitral valve prolapse Mother   . Arrhythmia Mother   . Hypertension Mother   . Heart disease Maternal Grandfather   . Lung cancer Maternal Grandfather      Social History  Substance Use Topics  . Smoking status: Former Smoker    Packs/day: 0.25    Years: 1.00    Types: Cigarettes    Quit date: 03/13/1998  . Smokeless tobacco: Never Used  .  Alcohol use No    Allergies as of 04/09/2016 - Review Complete 04/09/2016  Allergen Reaction Noted  . Biaxin [clarithromycin] Hives 01/12/2016  . Toradol [ketorolac tromethamine]  02/17/2016  . Morphine and related  02/17/2016  . Tape Rash 03/11/2016    Review of Systems:    All systems reviewed and negative except where noted in HPI.   Physical Exam:  BP 109/70 (BP Location: Right Arm, Patient Position: Sitting, Cuff Size: Large)   Pulse 76   Temp 97.8 F (36.6 C) (Oral)  No LMP recorded. Psych:  Alert and cooperative. Normal mood and affect. General:   Alert,  Well-developed, well-nourished, pleasant and cooperative in NAD Head:  Normocephalic and atraumatic. Eyes:  Sclera clear, no icterus.   Conjunctiva pink. Ears:  Normal auditory acuity. Nose:  No deformity, discharge, or lesions. Mouth:  No deformity or lesions,oropharynx pink & moist. Neck:  Supple; no masses or thyromegaly. Lungs:  Respirations even and unlabored.  Clear throughout to auscultation.   No wheezes, crackles, or rhonchi. No acute distress. Heart:  Regular rate and rhythm; no murmurs, clicks, rubs, or gallops. Abdomen:  Normal bowel sounds.  No bruits.  Soft, non-tender and non-distended without masses, hepatosplenomegaly or hernias noted.  No guarding or rebound tenderness.    Msk:  Symmetrical without gross deformities. Good, equal movement & strength bilaterally. Pulses:  Normal pulses noted. Extremities:  No clubbing or edema.  No cyanosis. Neurologic:  Alert and oriented x3;  grossly normal neurologically. Skin:  Intact without significant lesions or rashes. No jaundice. Lymph Nodes:  No significant cervical adenopathy. Psych:  Alert and cooperative. Normal mood and affect.  Imaging Studies: Ct Abdomen Pelvis W Contrast  Result Date: 03/23/2016 CLINICAL DATA:  Status post cholecystectomy 3 days ago, with redness and inflammation of wound site EXAM: CT ABDOMEN AND PELVIS WITH CONTRAST TECHNIQUE:  Multidetector CT imaging of the abdomen and pelvis was performed using the standard protocol following bolus administration of intravenous contrast. CONTRAST:  100 mL Isovue 300 IV COMPARISON:  CT abdomen/ pelvis dated 06/09/2009 FINDINGS: Lower chest: Mild linear scarring/ atelectasis in the bilateral lower lobes. Hepatobiliary: Liver is within normal limits. Status post cholecystectomy. No intrahepatic or extrahepatic ductal dilatation. Pancreas: Within normal limits. Spleen: Within normal limits. Adrenals/Urinary Tract: 1.5 cm low-density left adrenal nodule (series 2/ image 25), previously 8 mm, technically indeterminate but likely reflecting a benign adrenal adenoma. Right adrenal gland is within normal limits. Kidneys are within normal limits.  No hydronephrosis. Bladder is within normal limits. Stomach/Bowel: Stomach is within normal limits. No evidence of bowel obstruction. Prior appendectomy. Vascular/Lymphatic: No evidence of abdominal aortic aneurysm. No suspicious abdominopelvic lymphadenopathy. Reproductive: Uterus is retroverted and grossly unremarkable. Bilateral ovaries are within normal limits. Other: No abdominopelvic ascites. Postsurgical changes in the anterior abdominal wall. No drainable fluid collection/ abscess. Musculoskeletal: Mild degenerative changes of the visualized thoracolumbar spine. IMPRESSION: Status post cholecystectomy. Postsurgical changes in the anterior abdominal wall. No drainable fluid collection/ abscess. 1.5 cm low-density left adrenal nodule, previously 8 mm in 2011, technically indeterminate but likely reflecting a benign adrenal adenoma. Additional ancillary findings as above. Electronically Signed   By: Julian Hy M.D.   On: 03/23/2016 13:19    Assessment and Plan:   Anita Massey is a 36 y.o. y/o female has been referred for abnormal LFT's, chronic diarrhea .    Plan  1. Abnormal LFT's- appears recent -?secondary to alergic reaction to antibiotics.  Will recheck today , if not improving will need full liver screen   2. Chronic diarrhea- Meets criteria for IBS-D . check stool for infection, lactoferrin,celiac serology, tsh, EGD and colonoscopy with biopsies.   I have discussed alternative options, risks & benefits,  which include, but are not limited to, bleeding, infection, perforation,respiratory complication & drug reaction.  The patient agrees with this plan & written consent will be obtained.  Follow up in 2 months    Dr Jonathon Bellows MD

## 2016-04-11 LAB — CELIAC DISEASE PANEL
ENDOMYSIAL ANTIBODY IGA: NEGATIVE
IGA: 176 mg/dL (ref 87–352)

## 2016-04-15 ENCOUNTER — Other Ambulatory Visit: Payer: Self-pay

## 2016-04-15 DIAGNOSIS — R197 Diarrhea, unspecified: Secondary | ICD-10-CM

## 2016-04-15 DIAGNOSIS — K625 Hemorrhage of anus and rectum: Secondary | ICD-10-CM

## 2016-04-15 DIAGNOSIS — R110 Nausea: Secondary | ICD-10-CM

## 2016-04-15 MED ORDER — PEG 3350-KCL-NA BICARB-NACL 420 G PO SOLR: 4000.0000 mL | Freq: Once | ORAL | 0 refills | Status: AC

## 2016-04-16 ENCOUNTER — Encounter: Admission: RE | Payer: Self-pay | Source: Ambulatory Visit

## 2016-04-16 ENCOUNTER — Other Ambulatory Visit: Payer: Self-pay

## 2016-04-16 ENCOUNTER — Ambulatory Visit
Admission: RE | Admit: 2016-04-16 | Payer: BLUE CROSS/BLUE SHIELD | Source: Ambulatory Visit | Admitting: Gastroenterology

## 2016-04-16 DIAGNOSIS — R748 Abnormal levels of other serum enzymes: Secondary | ICD-10-CM

## 2016-04-16 SURGERY — COLONOSCOPY WITH PROPOFOL
Anesthesia: General

## 2016-04-18 ENCOUNTER — Ambulatory Visit: Payer: BLUE CROSS/BLUE SHIELD | Admitting: Surgery

## 2016-04-23 ENCOUNTER — Ambulatory Visit
Admission: RE | Admit: 2016-04-23 | Payer: BLUE CROSS/BLUE SHIELD | Source: Ambulatory Visit | Admitting: Gastroenterology

## 2016-04-23 ENCOUNTER — Encounter: Admission: RE | Payer: Self-pay | Source: Ambulatory Visit

## 2016-04-23 SURGERY — COLONOSCOPY WITH PROPOFOL
Anesthesia: General

## 2016-05-17 ENCOUNTER — Ambulatory Visit: Admit: 2016-05-17 | Payer: BLUE CROSS/BLUE SHIELD | Admitting: Gastroenterology

## 2016-05-17 SURGERY — COLONOSCOPY WITH PROPOFOL
Anesthesia: General

## 2016-06-11 ENCOUNTER — Ambulatory Visit: Payer: BLUE CROSS/BLUE SHIELD | Admitting: Gastroenterology

## 2016-06-22 ENCOUNTER — Encounter: Payer: Self-pay | Admitting: Emergency Medicine

## 2016-06-22 DIAGNOSIS — F419 Anxiety disorder, unspecified: Secondary | ICD-10-CM | POA: Diagnosis not present

## 2016-06-22 DIAGNOSIS — I10 Essential (primary) hypertension: Secondary | ICD-10-CM | POA: Diagnosis not present

## 2016-06-22 DIAGNOSIS — Z79899 Other long term (current) drug therapy: Secondary | ICD-10-CM | POA: Diagnosis not present

## 2016-06-22 DIAGNOSIS — Z87891 Personal history of nicotine dependence: Secondary | ICD-10-CM | POA: Insufficient documentation

## 2016-06-22 LAB — POCT PREGNANCY, URINE: PREG TEST UR: NEGATIVE

## 2016-06-22 MED ORDER — ONDANSETRON 4 MG PO TBDP
4.0000 mg | ORAL_TABLET | Freq: Once | ORAL | Status: AC | PRN
  Administered 2016-06-22: 4 mg via ORAL
  Filled 2016-06-22: qty 1

## 2016-06-22 NOTE — ED Triage Notes (Signed)
Pt ambulatory to triage in NAD, report high blood pressure at home, SBP 190s, report on BP medication and takes as directed.  Pt reports headache and nausea as well.

## 2016-06-23 ENCOUNTER — Emergency Department
Admission: EM | Admit: 2016-06-23 | Discharge: 2016-06-23 | Disposition: A | Discharge status: Home / Self Care | Payer: BLUE CROSS/BLUE SHIELD | Attending: Emergency Medicine | Admitting: Emergency Medicine

## 2016-06-23 DIAGNOSIS — F419 Anxiety disorder, unspecified: Secondary | ICD-10-CM

## 2016-06-23 DIAGNOSIS — I10 Essential (primary) hypertension: Secondary | ICD-10-CM

## 2016-06-23 LAB — COMPREHENSIVE METABOLIC PANEL
ALT: 28 U/L (ref 14–54)
ANION GAP: 7 (ref 5–15)
AST: 30 U/L (ref 15–41)
Albumin: 4.3 g/dL (ref 3.5–5.0)
Alkaline Phosphatase: 132 U/L — ABNORMAL HIGH (ref 38–126)
BILIRUBIN TOTAL: 0.6 mg/dL (ref 0.3–1.2)
BUN: 10 mg/dL (ref 6–20)
CO2: 25 mmol/L (ref 22–32)
Calcium: 9.7 mg/dL (ref 8.9–10.3)
Chloride: 107 mmol/L (ref 101–111)
Creatinine, Ser: 0.67 mg/dL (ref 0.44–1.00)
Glucose, Bld: 119 mg/dL — ABNORMAL HIGH (ref 65–99)
POTASSIUM: 3.9 mmol/L (ref 3.5–5.1)
Sodium: 139 mmol/L (ref 135–145)
TOTAL PROTEIN: 7.8 g/dL (ref 6.5–8.1)

## 2016-06-23 LAB — CBC
HEMATOCRIT: 39.5 % (ref 35.0–47.0)
Hemoglobin: 13.6 g/dL (ref 12.0–16.0)
MCH: 29.5 pg (ref 26.0–34.0)
MCHC: 34.4 g/dL (ref 32.0–36.0)
MCV: 85.8 fL (ref 80.0–100.0)
Platelets: 306 10*3/uL (ref 150–440)
RBC: 4.6 MIL/uL (ref 3.80–5.20)
RDW: 13.1 % (ref 11.5–14.5)
WBC: 12.6 10*3/uL — AB (ref 3.6–11.0)

## 2016-06-23 LAB — TROPONIN I

## 2016-06-23 LAB — HCG, QUANTITATIVE, PREGNANCY

## 2016-06-23 MED ORDER — LORAZEPAM 0.5 MG PO TABS: 0.5000 mg | ORAL_TABLET | Freq: Three times a day (TID) | ORAL | 0 refills | Status: DC | PRN

## 2016-06-23 MED ORDER — LORAZEPAM 1 MG PO TABS
1.0000 mg | ORAL_TABLET | Freq: Once | ORAL | Status: AC
  Administered 2016-06-23: 1 mg via ORAL
  Filled 2016-06-23: qty 1

## 2016-06-23 NOTE — ED Provider Notes (Signed)
[facility] Emergency Department Provider Note  Time seen: 1:42 AM  I have reviewed the triage vital signs and the nursing notes.   HISTORY  Chief Complaint Hypertension; Headache; and Nausea    HPI Anita Massey is a 36 y.o. female with a past medical history of anxiety, anemia, hypertension, presents the emergency department with high blood pressure. According to the patient for the past 3 or 4 weeks she has been feeling intermittent nausea, dizziness, generalized fatigue and weakness. States her periods have become irregular her last period was approximately 5 weeks ago. Patient took a pregnancy test at home which was negative. She states tonight she was not feeling well which she describes more as a dizziness-type sensation. She took her blood pressure and it was 106 systolic which concerned the patient so she came to the ER for evaluation. Patient takes labetalol for blood pressure twice daily, denies missing any doses. Patient admits to some anxiety. States she is mentioned her symptoms to her primary care doctor who is not adequately addressing them so she has a new primary care doctor appointment in approximately 3 weeks.  Past Medical History:  Diagnosis Date  . ANA positive 09/04/2015   Overview:  Low titer ANA 1/80  . Anemia   . Anxiety state, unspecified   . 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 Clotting disorder ([place])   . Contact dermatitis and other eczema, due to unspecified cause   . Dysuria   . Essential hypertension, malignant 03/01/2013  . GERD (gastroesophageal reflux disease)    OCC  . Headache    MIGRAINES  . Heart palpitations   . 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 Hypertension   . Multiple joint pain 09/04/2015  . Obesity   . Other psoriasis   . Palpitations   . Rosacea   . Syncope and collapse   . Undiagnosed cardiac murmurs   . Unspecified essential hypertension     Patient Active  Problem List   Diagnosis Date Noted  . S/P laparoscopic cholecystectomy 04/03/2016  . Allergic urticaria   . Abdominal pain 03/23/2016  . ANA positive 09/04/2015  . Multiple joint pain 09/04/2015  . Anxiety 08/15/2015  . Rosacea 08/15/2015  . Dysuria 04/29/2013  . Hematuria 04/29/2013  . Essential hypertension, malignant 03/01/2013  . Palpitations 04/09/2011    Past Surgical History:  Procedure Laterality Date  . APPENDECTOMY  2009   [facility]  . CESAREAN SECTION  2016  . CHOLECYSTECTOMY N/A 03/20/2016   Procedure: LAPAROSCOPIC CHOLECYSTECTOMY;  Surgeon: Jules Husbands, MD;  Location: [facility] ORS;  Service: General;  Laterality: N/A;  . DILATION AND CURETTAGE OF UTERUS      Prior to Admission medications   Medication Sig Start Date End Date Taking? Authorizing Provider  aspirin-acetaminophen-caffeine (EXCEDRIN MIGRAINE) [phone] MG tablet Take 1 tablet by mouth every 6 (six) hours as needed for headache.    [provider]  Calcium Carbonate-Simethicone (ALKA-SELTZER HEARTBURN + GAS PO) Take 1 tablet by mouth as needed.    [provider]  diphenhydrAMINE (BENADRYL) 25 mg capsule Take 1 capsule (25 mg total) by mouth every 6 (six) hours as needed. Patient not taking: Reported on 04/09/2016 03/25/16   Olean Ree, MD  doxycycline (VIBRAMYCIN) 100 MG capsule Take 100 mg by mouth daily as needed (rosacea).     [provider]  hydrocortisone cream 1 % Apply topically 2 (two) times daily. 03/25/16  Olean Ree, MD  ibuprofen (ADVIL,MOTRIN) 200 MG tablet Take 200-400 mg by mouth every 6 (six) hours as needed for mild pain (depends on pain level if takes 1-2 tablets).    [provider]  ibuprofen (ADVIL,MOTRIN) 600 MG tablet Take 1 tablet (600 mg total) by mouth every 8 (eight) hours as needed for fever or mild pain. Patient not taking: Reported on 04/09/2016 03/25/16   Olean Ree, MD  labetalol (NORMODYNE) 100 MG tablet Take 100 mg by mouth 2 (two) times  daily.    [provider]  LORazepam (ATIVAN) 0.5 MG tablet TAKE 1 TABLET BY MOUTH EVERY 8 HOURS AS NEEDED FOR ANXIETY FOR UP TO 10 DAYS 01/24/16   [provider]  ondansetron (ZOFRAN) 4 MG tablet TAKE 1 TABLET (4 MG TOTAL) BY MOUTH EVERY 6 (SIX) HOURS AS NEEDED FOR NAUSEA OR VOMITING. 03/20/16   [provider]  ondansetron (ZOFRAN-ODT) 4 MG disintegrating tablet DISSOLVE 1 TABLET ON THE TONGUE EVERY 8 HOURS AS NEEDED FOR NAUSEA OR VOMITING 02/17/16   [provider]  oxyCODONE (OXY IR/ROXICODONE) 5 MG immediate release tablet TAKE 1 TO 2 TABLETS BY MOUTH EVERY 6 HOURS AS NEEDED SEVERE PAIN 03/25/16   [provider]  oxyCODONE-acetaminophen (PERCOCET) 7.5-325 MG tablet TAKE 2 TABS EVERY 4 HOURS AS NEEDED FOR MODERATE OR SEVERE PAIN (1 FOR MODERATE, 2 FOR SEVERE PAIN) 03/20/16   [provider]  Pediatric Multivit-Minerals-C (RA GUMMY VITAMINS & MINERALS) CHEW Chew 2 tablets by mouth daily. Women's gummies    [provider]  promethazine (PHENERGAN) 12.5 MG tablet TAKE 1 TABLET (12.5 MG TOTAL) BY MOUTH EVERY 6 (SIX) HOURS AS NEEDED FOR NAUSEA. 01/24/16   [provider]  sertraline (ZOLOFT) 50 MG tablet Take 50 mg by mouth every morning.     [provider]    Allergies  Allergen Reactions  . Biaxin [Clarithromycin] Hives  . Toradol [Ketorolac Tromethamine]     Chest pain   . Morphine And Related     Chest Pain   . Tape Rash    Paper tape is fine    Family History  Problem Relation Age of Onset  . Mitral valve prolapse Mother   . Arrhythmia Mother   . Hypertension Mother   . Heart disease Maternal Grandfather   . Lung cancer Maternal Grandfather     Social History Social History  Substance Use Topics  . Smoking status: Former Smoker    Packs/day: 0.25    Years: 1.00    Types: Cigarettes    Quit date: 03/13/1998  . Smokeless tobacco: Never Used  . Alcohol use No    Review of Systems Constitutional:  Negative for fever.Positive for dizziness at times. Cardiovascular: Negative for chest pain. Respiratory: Negative for shortness of breath. Gastrointestinal: Negative for abdominal pain.  positive for nausea at times. Genitourinary: Negative for dysuria.Positive urinary frequency. Musculoskeletal: Negative for back pain. Skin: Negative for rash. Neurological: Negative for headache All other ROS negative  ____________________________________________   PHYSICAL EXAM:  VITAL SIGNS: ED Triage Vitals  Enc Vitals Group     BP 06/22/16 2207 (!) 177/105     Pulse Rate 06/22/16 2207 91     Resp 06/22/16 2207 20     Temp 06/22/16 2207 97.9 F (36.6 C)     Temp Source 06/22/16 2207 Oral     SpO2 06/22/16 2207 99 %     Weight 06/22/16 2208 180 lb (81.6 kg)     Height  06/22/16 2208 5\' 2"  (1.575 m)     Head Circumference --      Peak Flow --      Pain Score 06/22/16 2207 8     Pain Loc --      Pain Edu? --      Excl. in [place]? --     Constitutional: Alert and oriented. Well appearing and in no distress. Eyes: Normal exam ENT   Head: Normocephalic and atraumatic.   Mouth/Throat: Mucous membranes are moist. Cardiovascular: Normal rate, regular rhythm. No murmur Respiratory: Normal respiratory effort without tachypnea nor retractions. Breath sounds are clear Gastrointestinal: Soft and nontender. No distention.  Musculoskeletal: Nontender with normal range of motion in all extremities.  Neurologic:  Normal speech and language. No gross focal neurologic deficits Skin:  Skin is warm, dry and intact.  Psychiatric: Mood and affect are normal.   ____________________________________________   INITIAL IMPRESSION / ASSESSMENT AND PLAN / ED COURSE  Pertinent labs & imaging results that were available during my care of the patient were reviewed by me and considered in my medical decision making (see chart for details).  Overall very well-appearing patient. Current blood pressure  167/94. Patient does appear very slightly anxious, history of underlying anxiety we will treat with 1 mg of oral Ativan. We will check labs and closely monitor in the emergency department.  Patient's labs are normal. Patient's blood pressures normalized after Ativan. We'll discharge the very short course of Ativan. The patient has a primary care appointment for July 1.  ____________________________________________   FINAL CLINICAL IMPRESSION(S) / ED DIAGNOSES  Anxiety Hypertension    Harvest Dark, MD 06/23/16 [phone]

## 2016-06-23 NOTE — ED Notes (Signed)
ED Provider at bedside. 

## 2016-07-23 ENCOUNTER — Ambulatory Visit (INDEPENDENT_AMBULATORY_CARE_PROVIDER_SITE_OTHER): Payer: BLUE CROSS/BLUE SHIELD | Admitting: Internal Medicine

## 2016-07-23 ENCOUNTER — Encounter: Payer: Self-pay | Admitting: Internal Medicine

## 2016-07-23 VITALS — BP 122/80 | HR 83 | Temp 97.9°F | Ht 62.0 in | Wt 180.0 lb

## 2016-07-23 DIAGNOSIS — R0989 Other specified symptoms and signs involving the circulatory and respiratory systems: Secondary | ICD-10-CM

## 2016-07-23 DIAGNOSIS — M2550 Pain in unspecified joint: Secondary | ICD-10-CM | POA: Diagnosis not present

## 2016-07-23 DIAGNOSIS — L719 Rosacea, unspecified: Secondary | ICD-10-CM

## 2016-07-23 DIAGNOSIS — F410 Panic disorder [episodic paroxysmal anxiety] without agoraphobia: Secondary | ICD-10-CM | POA: Diagnosis not present

## 2016-07-23 MED ORDER — METOPROLOL SUCCINATE ER 100 MG PO TB24: 100.0000 mg | ORAL_TABLET | Freq: Every day | ORAL | 3 refills | Status: DC

## 2016-07-23 MED ORDER — SERTRALINE HCL 100 MG PO TABS: 100.0000 mg | ORAL_TABLET | Freq: Every day | ORAL | 0 refills | Status: DC

## 2016-07-23 NOTE — Assessment & Plan Note (Signed)
Thinks she liked the metoprolol more Will switch to this Will need to consider endocrine cause with known adrenal adenoma (if ongoing paroxysms, flushing, diarrhea)

## 2016-07-23 NOTE — Assessment & Plan Note (Signed)
Ongoing stress but this is not controlled Will increase the setraline

## 2016-07-23 NOTE — Progress Notes (Signed)
   Subjective:    Patient ID: Anita Massey, female    DOB: [birth year], 36 y.o.   MRN: [id number]  HPI Here to establish care With mom Noralee Chars) and 2 children Has been going for care at [place] she is ready for a change  Has had bad BP elevations Has been to ER due to this Seems to go up at night--- gets arm and neck tingling (and felt funny) As high as 190/160 at times on her monitor Does have stress all day with her children Is on sertraline for anxiety--- for several years (started after multiple miscarriages but then was off and later went back on it) Anxiety seems to be worse lately No regular depression but has had some no spells of crying (worried about losing family, etc)  HTN started before the pregnancies  Changed from metoprolol to labetalol during the pregnancy No regular edema  Chronic problems with diarrhea ?IBS, med? After gallbladder removed? Scheduled for colonoscopy but has been postponed Appetite is okay Weight up and down Limiting the doxy due to diarrhea   Review of Systems  Constitutional: Positive for fatigue. Negative for unexpected weight change.  HENT: Negative for dental problem and hearing loss.   Eyes: Negative for visual disturbance.       No diplopia or unilateral vision loss  Respiratory: Negative for cough, chest tightness and shortness of breath.   Cardiovascular: Positive for palpitations. Negative for chest pain and leg swelling.  Gastrointestinal: Positive for diarrhea. Negative for abdominal pain and blood in stool.       Pelvic pain at times---"almost doubles me over"  Endocrine: Positive for polydipsia and polyuria.  Genitourinary: Positive for dyspareunia. Negative for dysuria and hematuria.       Periods are irregular and "horrible". Very heavy and some pain  Musculoskeletal: Positive for arthralgias and back pain. Negative for joint swelling.  Skin:       Facial rash  Allergic/Immunologic: Positive for  environmental allergies. Negative for immunocompromised state.       Allergra and flonase in season  Neurological: Positive for dizziness and headaches. Negative for syncope.  Psychiatric/Behavioral: Positive for dysphoric mood. The patient is nervous/anxious.        Variable sleep       Objective:   Physical Exam  Constitutional: She appears well-nourished. No distress.  HENT:  Mouth/Throat: Oropharynx is clear and moist. No oropharyngeal exudate.  Neck: No thyromegaly present.  Cardiovascular: Normal rate, regular rhythm, normal heart sounds and intact distal pulses.  Exam reveals no gallop.   No murmur heard. Pulmonary/Chest: Effort normal and breath sounds normal. No respiratory distress. She has no wheezes. She has no rales.  Abdominal: Soft. There is no tenderness.  Musculoskeletal: She exhibits no edema or tenderness.  Lymphadenopathy:    She has no cervical adenopathy.  Psychiatric: She has a normal mood and affect. Her behavior is normal.          Assessment & Plan:

## 2016-07-23 NOTE — Assessment & Plan Note (Signed)
No clear synovitis Low titer ANA has no real meaning or significance

## 2016-07-23 NOTE — Assessment & Plan Note (Signed)
Limiting the doxy due to diarrhea

## 2016-08-26 ENCOUNTER — Ambulatory Visit: Payer: BLUE CROSS/BLUE SHIELD | Admitting: Internal Medicine

## 2016-08-26 ENCOUNTER — Telehealth: Payer: Self-pay | Admitting: Internal Medicine

## 2016-08-26 NOTE — Progress Notes (Signed)
Subjective:    Patient ID: Anita Massey, female    DOB: 1980/06/10, 36 y.o.   MRN: [id number]  HPI  Current Outpatient Prescriptions on File Prior to Visit  Medication Sig Dispense Refill  . aspirin-acetaminophen-caffeine (EXCEDRIN MIGRAINE) 250-250-65 MG tablet Take 1 tablet by mouth every 6 (six) hours as needed for headache.    . Calcium Carbonate-Simethicone (ALKA-SELTZER HEARTBURN + GAS PO) Take 1 tablet by mouth as needed.    . doxycycline (VIBRAMYCIN) 100 MG capsule Take 100 mg by mouth daily as needed (rosacea).     . hydrocortisone cream 1 % Apply topically 2 (two) times daily. 30 g 0  . ibuprofen (ADVIL,MOTRIN) 200 MG tablet Take 200-400 mg by mouth every 6 (six) hours as needed for mild pain (depends on pain level if takes 1-2 tablets).    . ondansetron (ZOFRAN-ODT) 4 MG disintegrating tablet DISSOLVE 1 TABLET ON THE TONGUE EVERY 8 HOURS AS NEEDED FOR NAUSEA OR VOMITING  0  . Pediatric Multivit-Minerals-C (RA GUMMY VITAMINS & MINERALS) CHEW Chew 2 tablets by mouth daily. Women's gummies     No current facility-administered medications on file prior to visit.     Allergies  Allergen Reactions  . Biaxin [Clarithromycin] Hives  . Toradol [Ketorolac Tromethamine]     Chest pain   . Morphine And Related     Chest Pain   . Tape Rash    Paper tape is fine    Past Medical History:  Diagnosis Date  . Anemia   . Anxiety state, unspecified   . Blood dyscrasia    PROTHROMBIN G 76546 HETEROZYGOSITY (FACTOR 2)  . Clotting disorder ([place])   . GERD (gastroesophageal reflux disease)    OCC  . 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 Hypertension   . Multiple joint pain 09/04/2015  . Obesity   . Rosacea   . Undiagnosed cardiac murmurs   . Unspecified essential hypertension     Past Surgical History:  Procedure Laterality Date  . APPENDECTOMY  2009   [facility]  . CESAREAN SECTION  2016  . CHOLECYSTECTOMY N/A 03/20/2016   Procedure: LAPAROSCOPIC CHOLECYSTECTOMY;   Surgeon: Jules Husbands, MD;  Location: [facility] ORS;  Service: General;  Laterality: N/A;  . DILATION AND CURETTAGE OF UTERUS      Family History  Problem Relation Age of Onset  . Mitral valve prolapse Mother   . Arrhythmia Mother   . Hypertension Mother   . Diabetes Mother   . Hypertension Father   . Hyperlipidemia Father   . Heart disease Maternal Grandfather   . Lung cancer Maternal Grandfather   . Cancer Maternal Grandfather   . Diabetes Brother   . Heart disease Maternal Aunt   . Diabetes Maternal Aunt   . Depression Maternal Aunt   . Cancer Maternal Grandmother        ovarian melanoma    Social History   Social History  . Marital status: Married    Spouse name: N/A  . Number of children: 2  . Years of education: N/A   Occupational History  . Homemaker    Social History Main Topics  . Smoking status: Former Smoker    Packs/day: 0.25    Years: 1.00    Types: Cigarettes    Quit date: 03/13/1998  . Smokeless tobacco: Never Used  . Alcohol use No  . Drug use: No  . Sexual activity: Not on file   Other Topics Concern  . Not  on file   Social History Narrative            Review of Systems     Objective:   Physical Exam        Assessment & Plan:

## 2016-08-26 NOTE — Telephone Encounter (Signed)
Please reschedule at her earliest convenience. Do not charge the NSF

## 2016-08-26 NOTE — Telephone Encounter (Signed)
Patient did not come in for their appointment today for follow up. Please let me know if patient needs to be contacted immediately for follow up or no follow up needed. Do you want to charge the NSF?  Pt called at 230 pm, she apologized for missing appointment, she forgot.

## 2016-09-01 NOTE — Progress Notes (Deleted)
Gynecology Annual Exam  PCP: Venia Carbon, MD  Chief Complaint: No chief complaint on file.   History of Present Illness: Patient is a 36 y.o. No obstetric history on file. presents for annual exam. The patient has no complaints today.   LMP: No LMP recorded. Average Interval: {Desc; regular/irreg:14544}, {numbers 22-35:14824} days Duration of flow: {numbers; 0-10:33138} days Heavy Menses: {yes/no:63} Clots: {yes/no:63} Intermenstrual Bleeding: {yes/no:63} Postcoital Bleeding: {yes/no:63} Dysmenorrhea: {yes/no:63}  The patient {sys sexually active:13135} sexually active. She currently uses {method:5051} for contraception. She {has/denies:315300} dyspareunia.  The patient {DOES_DOES GNF:62130} perform self breast exams.  There {is/is no:19420} notable family history of breast or ovarian cancer in her family.  The patient wears seatbelts: {yes/no:63}.   The patient has regular exercise: {yes/no/not asked:9010}.    The patient {Blank single:19197::"reports","denies"} current symptoms of depression.    Review of Systems: ROS  Past Medical History:  Past Medical History:  Diagnosis Date  . Anemia   . Anxiety state, unspecified   . Blood dyscrasia    PROTHROMBIN G 86578 HETEROZYGOSITY (FACTOR 2)  . Clotting disorder ([place])   . GERD (gastroesophageal reflux disease)    OCC  . 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 Hypertension   . Multiple joint pain 09/04/2015  . Obesity   . Rosacea   . Undiagnosed cardiac murmurs   . Unspecified essential hypertension     Past Surgical History:  Past Surgical History:  Procedure Laterality Date  . APPENDECTOMY  2009   [facility]  . CESAREAN SECTION  2016  . CHOLECYSTECTOMY N/A 03/20/2016   Procedure: LAPAROSCOPIC CHOLECYSTECTOMY;  Surgeon: Jules Husbands, MD;  Location: [facility] ORS;  Service: General;  Laterality: N/A;  . DILATION AND CURETTAGE OF UTERUS      Gynecologic History:  No LMP recorded. Contraception:  {method:5051} Last Pap: Results were: 06/23/2012 NIL HPV negative   Obstetric History: No obstetric history on file.  Family History:  Family History  Problem Relation Age of Onset  . Mitral valve prolapse Mother   . Arrhythmia Mother   . Hypertension Mother   . Diabetes Mother   . Hypertension Father   . Hyperlipidemia Father   . Heart disease Maternal Grandfather   . Lung cancer Maternal Grandfather   . Cancer Maternal Grandfather   . Diabetes Brother   . Heart disease Maternal Aunt   . Diabetes Maternal Aunt   . Depression Maternal Aunt   . Cancer Maternal Grandmother        ovarian melanoma    Social History:  Social History   Social History  . Marital status: Married    Spouse name: N/A  . Number of children: 2  . Years of education: N/A   Occupational History  . Homemaker    Social History Main Topics  . Smoking status: Former Smoker    Packs/day: 0.25    Years: 1.00    Types: Cigarettes    Quit date: 03/13/1998  . Smokeless tobacco: Never Used  . Alcohol use No  . Drug use: No  . Sexual activity: Not on file   Other Topics Concern  . Not on file   Social History Narrative           Allergies:  Allergies  Allergen Reactions  . Biaxin [Clarithromycin] Hives  . Toradol [Ketorolac Tromethamine]     Chest pain   . Morphine And Related     Chest Pain   . Tape Rash  Paper tape is fine    Medications: Prior to Admission medications   Medication Sig Start Date End Date Taking? Authorizing Provider  aspirin-acetaminophen-caffeine (EXCEDRIN MIGRAINE) [phone] MG tablet Take 1 tablet by mouth every 6 (six) hours as needed for headache.    [provider]  Calcium Carbonate-Simethicone (ALKA-SELTZER HEARTBURN + GAS PO) Take 1 tablet by mouth as needed.    [provider]  doxycycline (VIBRAMYCIN) 100 MG capsule Take 100 mg by mouth daily as needed (rosacea).     [provider]  hydrocortisone cream 1 % Apply topically  2 (two) times daily. 03/25/16   Olean Ree, MD  ibuprofen (ADVIL,MOTRIN) 200 MG tablet Take 200-400 mg by mouth every 6 (six) hours as needed for mild pain (depends on pain level if takes 1-2 tablets).    [provider]  metoprolol succinate (TOPROL-XL) 100 MG 24 hr tablet Take 1 tablet (100 mg total) by mouth daily. Take with or immediately following a meal. 07/23/16   Viviana Simpler I, MD  ondansetron (ZOFRAN-ODT) 4 MG disintegrating tablet DISSOLVE 1 TABLET ON THE TONGUE EVERY 8 HOURS AS NEEDED FOR NAUSEA OR VOMITING 02/17/16   [provider]  Pediatric Multivit-Minerals-C (RA GUMMY VITAMINS & MINERALS) CHEW Chew 2 tablets by mouth daily. Women's gummies    [provider]  sertraline (ZOLOFT) 100 MG tablet Take 1 tablet (100 mg total) by mouth daily. 07/23/16   Venia Carbon, MD    Physical Exam Vitals: There were no vitals taken for this visit.  General: NAD HEENT: normocephalic, anicteric Thyroid: no enlargement, no palpable nodules Pulmonary: No increased work of breathing, CTAB Cardiovascular: RRR, distal pulses 2+ Breast: Breast symmetrical, no tenderness, no palpable nodules or masses, no skin or nipple retraction present, no nipple discharge.  No axillary or supraclavicular lymphadenopathy. Abdomen: NABS, soft, non-tender, non-distended.  Umbilicus without lesions.  No hepatomegaly, splenomegaly or masses palpable. No evidence of hernia  Genitourinary:  External: Normal external female genitalia.  Normal urethral meatus, normal  Bartholin's and Skene's glands.    Vagina: Normal vaginal mucosa, no evidence of prolapse.    Cervix: Grossly normal in appearance, no bleeding  Uterus: Non-enlarged, mobile, normal contour.  No CMT  Adnexa: ovaries non-enlarged, no adnexal masses  Rectal: deferred  Lymphatic: no evidence of inguinal lymphadenopathy Extremities: no edema, erythema, or tenderness Neurologic: Grossly intact Psychiatric: mood appropriate,  affect full  Female chaperone present for pelvic and breast  portions of the physical exam    Assessment: 36 y.o. No obstetric history on file. routine annual exam  Plan: Problem List Items Addressed This Visit    None      1) STI screening was offered and {Blank single:19197::"accepted","declined"}  2) ASCCP guidelines and rational discussed.  Patient opts for {Blank single:19197::"every 5 years","every 3 years","yearly"} screening interval  3) Contraception - Education given regarding options for contraception, including {contraceptive options (MU measure 33):20677::"NuvaRing","Nexplanon","Surgical Sterilization including vasectomy"}.  4) Routine healthcare maintenance including cholesterol, diabetes screening discussed {Blank single:19197::"managed by PCP","Ordered today","To return fasting at a later date","Declines"}  5) Follow up 1 year for routine annual exam

## 2016-09-02 ENCOUNTER — Ambulatory Visit: Payer: Self-pay | Admitting: Obstetrics and Gynecology

## 2016-09-11 ENCOUNTER — Encounter: Payer: Self-pay | Admitting: Internal Medicine

## 2016-09-11 ENCOUNTER — Ambulatory Visit (INDEPENDENT_AMBULATORY_CARE_PROVIDER_SITE_OTHER): Payer: BLUE CROSS/BLUE SHIELD | Admitting: Internal Medicine

## 2016-09-11 VITALS — BP 104/68 | HR 71 | Temp 97.7°F | Wt 181.0 lb

## 2016-09-11 DIAGNOSIS — R0989 Other specified symptoms and signs involving the circulatory and respiratory systems: Secondary | ICD-10-CM | POA: Diagnosis not present

## 2016-09-11 DIAGNOSIS — F410 Panic disorder [episodic paroxysmal anxiety] without agoraphobia: Secondary | ICD-10-CM

## 2016-09-11 DIAGNOSIS — D3500 Benign neoplasm of unspecified adrenal gland: Secondary | ICD-10-CM | POA: Diagnosis not present

## 2016-09-11 NOTE — Progress Notes (Signed)
Subjective:    Patient ID: Anita Massey, female    DOB: 09/09/1980, 36 y.o.   MRN: [id number]  HPI Here for follow up of labile blood pressure Very low now---unusual for her  Having breakout of rash on face again Afraid to use doxy due to diarrhea--every day Face flushes at times  Still has "don't care attitude" about everything "Not like me" Not really feeling sad No regular crying May be some better on the higher sertraline dose  Dizziness at times Shaky at times--like her hands No syncope  Current Outpatient Prescriptions on File Prior to Visit  Medication Sig Dispense Refill  . aspirin-acetaminophen-caffeine (EXCEDRIN MIGRAINE) 250-250-65 MG tablet Take 1 tablet by mouth every 6 (six) hours as needed for headache.    . Calcium Carbonate-Simethicone (ALKA-SELTZER HEARTBURN + GAS PO) Take 1 tablet by mouth as needed.    . doxycycline (VIBRAMYCIN) 100 MG capsule Take 100 mg by mouth daily as needed (rosacea).     Marland Kitchen ibuprofen (ADVIL,MOTRIN) 200 MG tablet Take 200-400 mg by mouth every 6 (six) hours as needed for mild pain (depends on pain level if takes 1-2 tablets).    . metoprolol succinate (TOPROL-XL) 100 MG 24 hr tablet Take 1 tablet (100 mg total) by mouth daily. Take with or immediately following a meal. 90 tablet 3  . ondansetron (ZOFRAN-ODT) 4 MG disintegrating tablet DISSOLVE 1 TABLET ON THE TONGUE EVERY 8 HOURS AS NEEDED FOR NAUSEA OR VOMITING  0  . Pediatric Multivit-Minerals-C (RA GUMMY VITAMINS & MINERALS) CHEW Chew 2 tablets by mouth daily. Women's gummies    . sertraline (ZOLOFT) 100 MG tablet Take 1 tablet (100 mg total) by mouth daily. 1 tablet 0   No current facility-administered medications on file prior to visit.     Allergies  Allergen Reactions  . Biaxin [Clarithromycin] Hives  . Toradol [Ketorolac Tromethamine]     Chest pain   . Morphine And Related     Chest Pain   . Tape Rash    Paper tape is fine    Past Medical History:  Diagnosis Date   . Anemia   . Anxiety state, unspecified   . 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 Clotting disorder ([place])   . GERD (gastroesophageal reflux disease)    OCC  . 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 Hypertension   . Multiple joint pain 09/04/2015  . Obesity   . Rosacea   . Undiagnosed cardiac murmurs   . Unspecified essential hypertension     Past Surgical History:  Procedure Laterality Date  . APPENDECTOMY  2009   [facility]  . CESAREAN SECTION  2016  . CHOLECYSTECTOMY N/A 03/20/2016   Procedure: LAPAROSCOPIC CHOLECYSTECTOMY;  Surgeon: Jules Husbands, MD;  Location: [facility] ORS;  Service: General;  Laterality: N/A;  . DILATION AND CURETTAGE OF UTERUS      Family History  Problem Relation Age of Onset  . Mitral valve prolapse Mother   . Arrhythmia Mother   . Hypertension Mother   . Diabetes Mother   . Hypertension Father   . Hyperlipidemia Father   . Heart disease Maternal Grandfather   . Lung cancer Maternal Grandfather   . Cancer Maternal Grandfather   . Diabetes Brother   . Heart disease Maternal Aunt   . Diabetes Maternal Aunt   . Depression Maternal Aunt   . Cancer Maternal Grandmother        ovarian melanoma  Social History   Social History  . Marital status: Married    Spouse name: N/A  . Number of children: 2  . Years of education: N/A   Occupational History  . Homemaker    Social History Main Topics  . Smoking status: Former Smoker    Packs/day: 0.25    Years: 1.00    Types: Cigarettes    Quit date: 03/13/1998  . Smokeless tobacco: Never Used  . Alcohol use No  . Drug use: No  . Sexual activity: Not on file   Other Topics Concern  . Not on file   Social History Narrative          Review of Systems Sleeps a lot Stays tired Occasional nausea Eating okay    Objective:   Physical Exam  Constitutional: No distress.  Cardiovascular: Normal rate, regular rhythm and normal heart sounds.  Exam reveals no  gallop.   No murmur heard. Pulmonary/Chest: Effort normal and breath sounds normal. No respiratory distress. She has no wheezes. She has no rales.  Abdominal: Soft. There is no tenderness.  Musculoskeletal: She exhibits no edema.  Psychiatric: She has a normal mood and affect. Her behavior is normal.          Assessment & Plan:

## 2016-09-11 NOTE — Assessment & Plan Note (Signed)
BP Readings from Last 3 Encounters:  09/11/16 104/68  07/23/16 122/80  06/23/16 123/76   Low now Not sure if fatigue can be from metoprolol but will continue for now Check urine catecholamines (negative in 2015) due to adrenal adenoma

## 2016-09-11 NOTE — Assessment & Plan Note (Signed)
Will check for pheo again due to sweats, HTN, diarrhea, etc Also for carcinoid--due to prominent diarrhea

## 2016-09-11 NOTE — Assessment & Plan Note (Signed)
Still tired ?some better on the higher sertraline Will continue for now

## 2016-11-06 ENCOUNTER — Other Ambulatory Visit: Payer: Self-pay

## 2016-11-06 NOTE — Telephone Encounter (Signed)
Received request for refill on zoloft for patient from a [place] which patient has not used in the past.  I have tried to contact patient using home and cell numbers; however, no answer and no voicemail.  Will continue to try and reach for clarification tomorrow.   Will also need to clarify what patient is taking as dosage was recently increased but has not had filled recently.    Patient does have ov follow up next week for medications.

## 2016-11-08 ENCOUNTER — Ambulatory Visit (INDEPENDENT_AMBULATORY_CARE_PROVIDER_SITE_OTHER)
Admission: RE | Admit: 2016-11-08 | Discharge: 2016-11-08 | Disposition: A | Discharge status: Home / Self Care | Payer: BLUE CROSS/BLUE SHIELD | Source: Ambulatory Visit | Attending: Internal Medicine | Admitting: Internal Medicine

## 2016-11-08 ENCOUNTER — Encounter: Payer: Self-pay | Admitting: Internal Medicine

## 2016-11-08 ENCOUNTER — Emergency Department
Admission: EM | Admit: 2016-11-08 | Discharge: 2016-11-08 | Disposition: A | Discharge status: Home / Self Care | Payer: BLUE CROSS/BLUE SHIELD | Attending: Emergency Medicine | Admitting: Emergency Medicine

## 2016-11-08 ENCOUNTER — Ambulatory Visit: Payer: Self-pay | Admitting: *Deleted

## 2016-11-08 ENCOUNTER — Ambulatory Visit (INDEPENDENT_AMBULATORY_CARE_PROVIDER_SITE_OTHER): Payer: BLUE CROSS/BLUE SHIELD | Admitting: Internal Medicine

## 2016-11-08 VITALS — BP 112/82 | HR 82 | Temp 97.9°F | Resp 18 | Wt 184.0 lb

## 2016-11-08 DIAGNOSIS — J4521 Mild intermittent asthma with (acute) exacerbation: Secondary | ICD-10-CM | POA: Diagnosis not present

## 2016-11-08 DIAGNOSIS — R05 Cough: Secondary | ICD-10-CM | POA: Diagnosis present

## 2016-11-08 DIAGNOSIS — Z79899 Other long term (current) drug therapy: Secondary | ICD-10-CM | POA: Diagnosis not present

## 2016-11-08 DIAGNOSIS — Z87891 Personal history of nicotine dependence: Secondary | ICD-10-CM | POA: Diagnosis not present

## 2016-11-08 DIAGNOSIS — I10 Essential (primary) hypertension: Secondary | ICD-10-CM | POA: Diagnosis not present

## 2016-11-08 DIAGNOSIS — J189 Pneumonia, unspecified organism: Secondary | ICD-10-CM | POA: Diagnosis not present

## 2016-11-08 DIAGNOSIS — J45909 Unspecified asthma, uncomplicated: Secondary | ICD-10-CM | POA: Insufficient documentation

## 2016-11-08 MED ORDER — IPRATROPIUM-ALBUTEROL 0.5-2.5 (3) MG/3ML IN SOLN
3.0000 mL | Freq: Once | RESPIRATORY_TRACT | Status: AC
  Administered 2016-11-08: 3 mL via RESPIRATORY_TRACT
  Filled 2016-11-08: qty 3

## 2016-11-08 MED ORDER — AZITHROMYCIN 250 MG PO TABS: ORAL_TABLET | ORAL | 0 refills | Status: DC

## 2016-11-08 MED ORDER — PREDNISONE 20 MG PO TABS: 20.0000 mg | ORAL_TABLET | Freq: Two times a day (BID) | ORAL | 0 refills | Status: DC

## 2016-11-08 MED ORDER — CEFTRIAXONE SODIUM 1 G IJ SOLR
1.0000 g | Freq: Once | INTRAMUSCULAR | Status: AC
  Administered 2016-11-08: 1 g via INTRAMUSCULAR
  Filled 2016-11-08: qty 10

## 2016-11-08 MED ORDER — SERTRALINE HCL 100 MG PO TABS: 50.0000 mg | ORAL_TABLET | Freq: Every day | ORAL | 11 refills | Status: DC

## 2016-11-08 MED ORDER — FEXOFENADINE HCL 180 MG PO TABS: 180.0000 mg | ORAL_TABLET | Freq: Every day | ORAL | 11 refills | Status: DC

## 2016-11-08 NOTE — Telephone Encounter (Signed)
Patient sounded nervous and weak. Slow to answer questions.   Reason for Disposition . [1] Life-threatening reaction in the past to similar substance (e.g., food, insect bite/sting, medication, etc.) AND [2] < 2 hours since exposure . Sounds like a life-threatening emergency to the triager  Answer Assessment - Initial Assessment Questions 1. MAIN SYMPTOM: "What is your main symptom?" "How bad is it?"       Throat feels funny 2. RESPIRATORY STATUS: "Are you having difficulty breathing?"  (e.g., yes/no, wheezing, unable to complete a sentence)     I think so 3. SWALLOWING: "Can you swallow?" (e.g., yes/no, food, fluid, saliva)      slightly 4. VASCULAR STATUS: "Are you feeling weak?" If so, ask: "Can you stand and walk normally?"     Nervous and shaky 5. ONSET: "When did the reaction start?" (Minutes or hours ago)      30 minutes ago 6. SUBSTANCE: "What are you reacting to?" "When did the contact occur?"     Prednisone and zpack taken 2 hours ago 7. PREVIOUS REACTION: "Have you ever reacted to it before?" If so, ask: "What happened that time?"     Previous reaction years ago to prednisone 8. EPINEPHRINE: "Do you have an epinephrine autoinjector (e.g., Epi-pen or Twinject)?"     no  Protocols used: ANAPHYLAXIS-A-AH

## 2016-11-08 NOTE — Telephone Encounter (Signed)
I spoke with pt and EMTs were at pts home and I let pt go to talk with EMTs. FYI to Dr Silvio Pate.

## 2016-11-08 NOTE — ED Triage Notes (Signed)
Pt saw her pcp this morning for cough lasting 2 weeks, pt states that she took prescribed z-pak dose and prednisone, pt denies feeling better and states that she started feeling funny this afternoon when it was time to get her son from school, pt arrives via ems

## 2016-11-08 NOTE — ED Provider Notes (Signed)
[facility] Emergency Department Provider Note  ____________________________________________  Time seen: Approximately 5:04 PM  I have reviewed the triage vital signs and the nursing notes.   HISTORY  Chief Complaint Cough    HPI GLADIS SOLEY is a 36 y.o. female presents to the emergency department with productive cough for approximately 2 weeks, fatigue and purulent sputum production.  Patient reports that she was seen by her primary care provider who started her on azithromycin and prednisone.  Patient reports that she took medications as directed and "felt funny" on the way to pick up her son.  Patient called EMS to transport her to the emergency department to have her symptoms reevaluated.  Patient denies chest pain, chest tightness, nausea, vomiting abdominal pain.   Past Medical History:  Diagnosis Date  . Anemia   . Anxiety state, unspecified   . Blood dyscrasia    PROTHROMBIN G 27035 HETEROZYGOSITY (FACTOR 2)  . Clotting disorder ([place])   . GERD (gastroesophageal reflux disease)    OCC  . 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 Hypertension   . Multiple joint pain 09/04/2015  . Obesity   . Rosacea   . Undiagnosed cardiac murmurs   . Unspecified essential hypertension     Patient Active Problem List   Diagnosis Date Noted  . Asthmatic bronchitis 11/08/2016  . Adrenal adenoma 09/11/2016  . Panic anxiety syndrome 07/23/2016  . Allergic urticaria   . Multiple joint pain 09/04/2015  . Rosacea 08/15/2015  . 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 Palpitations 04/09/2011    Past Surgical History:  Procedure Laterality Date  . APPENDECTOMY  2009   [facility]  . CESAREAN SECTION  2016  . CHOLECYSTECTOMY N/A 03/20/2016   Procedure: LAPAROSCOPIC CHOLECYSTECTOMY;  Surgeon: Jules Husbands, MD;  Location: [facility] ORS;  Service: General;  Laterality: N/A;  . DILATION AND CURETTAGE OF UTERUS      Prior to Admission medications   Medication Sig Start  Date End Date Taking? Authorizing Provider  aspirin-acetaminophen-caffeine (EXCEDRIN MIGRAINE) [phone] MG tablet Take 1 tablet by mouth every 6 (six) hours as needed for headache.    [provider]  azithromycin (ZITHROMAX Z-PAK) 250 MG tablet Take 2 tablets (500 mg) on  Day 1,  followed by 1 tablet (250 mg) once daily on Days 2 through 5. 11/08/16   Venia Carbon, MD  Calcium Carbonate-Simethicone (ALKA-SELTZER HEARTBURN + GAS PO) Take 1 tablet by mouth as needed.    [provider]  doxycycline (VIBRAMYCIN) 100 MG capsule Take 100 mg by mouth daily as needed (rosacea).     [provider]  fexofenadine (ALLEGRA) 180 MG tablet Take 1 tablet (180 mg total) by mouth daily. 11/08/16   Venia Carbon, MD  ibuprofen (ADVIL,MOTRIN) 200 MG tablet Take 200-400 mg by mouth every 6 (six) hours as needed for mild pain (depends on pain level if takes 1-2 tablets).    [provider]  metoprolol succinate (TOPROL-XL) 100 MG 24 hr tablet Take 1 tablet (100 mg total) by mouth daily. Take with or immediately following a meal. 07/23/16   Viviana Simpler I, MD  ondansetron (ZOFRAN-ODT) 4 MG disintegrating tablet DISSOLVE 1 TABLET ON THE TONGUE EVERY 8 HOURS AS NEEDED FOR NAUSEA OR VOMITING 02/17/16   [provider]  Pediatric Multivit-Minerals-C (RA GUMMY VITAMINS & MINERALS) CHEW Chew 2 tablets by mouth daily. Women's gummies    [provider]  predniSONE (DELTASONE) 20 MG tablet Take 1 tablet (  20 mg total) by mouth 2 (two) times daily with a meal. 11/08/16   Venia Carbon, MD  sertraline (ZOLOFT) 100 MG tablet Take 0.5 tablets (50 mg total) by mouth daily. 11/08/16   Venia Carbon, MD    Allergies Biaxin [clarithromycin]; Toradol [ketorolac tromethamine]; Morphine and related; and Tape  Family History  Problem Relation Age of Onset  . Mitral valve prolapse Mother   . Arrhythmia Mother   . Hypertension Mother   . Diabetes Mother   .  Hypertension Father   . Hyperlipidemia Father   . Heart disease Maternal Grandfather   . Lung cancer Maternal Grandfather   . Cancer Maternal Grandfather   . Diabetes Brother   . Heart disease Maternal Aunt   . Diabetes Maternal Aunt   . Depression Maternal Aunt   . Cancer Maternal Grandmother        ovarian melanoma    Social History Social History  Substance Use Topics  . Smoking status: Former Smoker    Packs/day: 0.25    Years: 1.00    Types: Cigarettes    Quit date: 03/13/1998  . Smokeless tobacco: Never Used  . Alcohol use No     Review of Systems  Constitutional: No fever/chills Eyes: No visual changes. No discharge ENT: No upper respiratory complaints. Cardiovascular: no chest pain. Respiratory: Patient has productive cough, shortness of breath and purulent sputum production. Gastrointestinal: No abdominal pain.  No nausea, no vomiting.  No diarrhea.  No constipation. Musculoskeletal: Negative for musculoskeletal pain. Skin: Negative for rash, abrasions, lacerations, ecchymosis. Neurological: Negative for headaches, focal weakness or numbness.  ____________________________________________   PHYSICAL EXAM:  VITAL SIGNS: ED Triage Vitals  Enc Vitals Group     BP 11/08/16 1627 139/83     Pulse Rate 11/08/16 1627 95     Resp 11/08/16 1627 20     Temp 11/08/16 1627 98.2 F (36.8 C)     Temp Source 11/08/16 1627 Oral     SpO2 11/08/16 1627 96 %     Weight 11/08/16 1628 180 lb (81.6 kg)     Height 11/08/16 1628 5\' 2"  (1.575 m)     Head Circumference --      Peak Flow --      Pain Score 11/08/16 1626 7     Pain Loc --      Pain Edu? --      Excl. in [place]? --      Constitutional: Alert and oriented. Well appearing and in no acute distress. Eyes: Conjunctivae are normal. PERRL. EOMI. Head: Atraumatic. ENT:      Ears: TMs are injected.      Nose: No congestion/rhinnorhea.      Mouth/Throat: Mucous membranes are moist.  Posterior pharynx is  nonerythematous. Cardiovascular: Normal rate, regular rhythm. Normal S1 and S2.  Good peripheral circulation. Respiratory: Normal respiratory effort without tachypnea or retractions.  Diffuse wheezing is auscultated bilaterally.  Good air entry to the bases with no decreased or absent breath sounds. Musculoskeletal: Full range of motion to all extremities. No gross deformities appreciated. Neurologic:  Normal speech and language. No gross focal neurologic deficits are appreciated.  Skin:  Skin is warm, dry and intact. No rash noted. ____________________________________________   LABS (all labs ordered are listed, but only abnormal results are displayed)  Labs Reviewed - No data to display ____________________________________________  EKG  Normal sinus rhythm without ST segment elevation atrial fibrillation. ____________________________________________  [place], personally viewed and  evaluated these images (plain radiographs) as part of my medical decision making, as well as reviewing the written report by the radiologist.  Dg Chest 2 View  Result Date: 11/08/2016 CLINICAL DATA:  Cough and wheezing . EXAM: CHEST  2 VIEW COMPARISON:  Chest x-ray 10/31/2014 . FINDINGS: Mediastinum and hilar structures normal. Lungs are clear. No focal infiltrate. No pleural effusion or pneumothorax. Heart size normal. Biapical pleural-parenchymal thickening consistent with scarring. Degenerative changes and scoliosis thoracic spine. Surgical clips right upper quadrant. IMPRESSION: No acute cardiopulmonary disease. Electronically Signed   By: Marcello Moores  Register   On: 11/08/2016 10:11    ____________________________________________    PROCEDURES  Procedure(s) performed:    Procedures    Medications  ipratropium-albuterol (DUONEB) 0.5-2.5 (3) MG/3ML nebulizer solution 3 mL (3 mLs Nebulization Given 11/08/16 1709)  cefTRIAXone (ROCEPHIN) injection 1 g (1 g Intramuscular Given  11/08/16 1708)     ____________________________________________   INITIAL IMPRESSION / ASSESSMENT AND PLAN / ED COURSE  Pertinent labs & imaging results that were available during my care of the patient were reviewed by me and considered in my medical decision making (see chart for details).  Review of the [place] CSRS was performed in accordance of the [place] prior to dispensing any controlled drugs.     Assessment and plan Community-acquired pneumonia Patient presents to the emergency department with productive cough for the past 2 weeks.  Differential diagnosis includes community-acquired pneumonia and acute bronchitis.  Patient underwent chest x-ray examination at her PCP today.  Chest x-ray does not reveal consolidations or findings consistent with community-acquired pneumonia.  Given patient's symptoms of shortness of breath, purulent sputum production and fatigue, patient was treated empirically for community-acquired pneumonia.  She received an injection of ceftriaxone in the emergency department and was advised to continue treatment per primary care physician.  EKG conducted in the emergency department revealed a normal sinus rhythm without ST segment elevation or atrial fibrillation.   ____________________________________________  FINAL CLINICAL IMPRESSION(S) / ED DIAGNOSES  Final diagnoses:  Community acquired pneumonia, unspecified laterality      NEW MEDICATIONS STARTED DURING THIS VISIT:  New Prescriptions   No medications on file        This chart was dictated using voice recognition software/Dragon. Despite best efforts to proofread, errors can occur which can change the meaning. Any change was purely unintentional.    Lannie Fields, PA-C 11/08/16 1731    Nena Polio, MD 11/09/16 0001

## 2016-11-08 NOTE — Assessment & Plan Note (Addendum)
Sick over 2 weeks Likely started viral but now worse CXR-- bronchial thickening but no clear pneumonia Will Rx with z-pak--has tolerated despite allergy to clarithromycin Prednisone x 3 days

## 2016-11-08 NOTE — ED Triage Notes (Signed)
FIRST NURSE NOTE-PT seen at pcp today and given meds for bronchitis. Took first dose and not better.

## 2016-11-08 NOTE — Progress Notes (Signed)
Subjective:    Patient ID: Anita Massey, female    DOB: Sep 23, 1980, 36 y.o.   MRN: [id number]  HPI Here due to persistent cough Concerned about bronchitis Coughing up chunks of stuff Wheezing Headache in past few days Feels weak and tiring easy  Started over 2 weeks ago [place] to [place] over a week ago---told allergies (but all family had it) No history of fall allergies in past  No fever Gets face flushing and cold chills SOB at times  Taking allegra and benedryl Tried excedrin for the headache  Current Outpatient Prescriptions on File Prior to Visit  Medication Sig Dispense Refill  . aspirin-acetaminophen-caffeine (EXCEDRIN MIGRAINE) 250-250-65 MG tablet Take 1 tablet by mouth every 6 (six) hours as needed for headache.    . Calcium Carbonate-Simethicone (ALKA-SELTZER HEARTBURN + GAS PO) Take 1 tablet by mouth as needed.    . doxycycline (VIBRAMYCIN) 100 MG capsule Take 100 mg by mouth daily as needed (rosacea).     Marland Kitchen ibuprofen (ADVIL,MOTRIN) 200 MG tablet Take 200-400 mg by mouth every 6 (six) hours as needed for mild pain (depends on pain level if takes 1-2 tablets).    . metoprolol succinate (TOPROL-XL) 100 MG 24 hr tablet Take 1 tablet (100 mg total) by mouth daily. Take with or immediately following a meal. 90 tablet 3  . ondansetron (ZOFRAN-ODT) 4 MG disintegrating tablet DISSOLVE 1 TABLET ON THE TONGUE EVERY 8 HOURS AS NEEDED FOR NAUSEA OR VOMITING  0  . Pediatric Multivit-Minerals-C (RA GUMMY VITAMINS & MINERALS) CHEW Chew 2 tablets by mouth daily. Women's gummies    . sertraline (ZOLOFT) 100 MG tablet Take 1 tablet (100 mg total) by mouth daily. (Patient taking differently: Take 50 mg by mouth daily. ) 1 tablet 0   No current facility-administered medications on file prior to visit.     Allergies  Allergen Reactions  . Biaxin [Clarithromycin] Hives  . Toradol [Ketorolac Tromethamine]     Chest pain   . Morphine And Related     Chest Pain   . Tape Rash   Paper tape is fine    Past Medical History:  Diagnosis Date  . Anemia   . Anxiety state, unspecified   . Blood dyscrasia    PROTHROMBIN G 90240 HETEROZYGOSITY (FACTOR 2)  . Clotting disorder ([place])   . GERD (gastroesophageal reflux disease)    OCC  . 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 Hypertension   . Multiple joint pain 09/04/2015  . Obesity   . Rosacea   . Undiagnosed cardiac murmurs   . Unspecified essential hypertension     Past Surgical History:  Procedure Laterality Date  . APPENDECTOMY  2009   [facility]  . CESAREAN SECTION  2016  . CHOLECYSTECTOMY N/A 03/20/2016   Procedure: LAPAROSCOPIC CHOLECYSTECTOMY;  Surgeon: Jules Husbands, MD;  Location: [facility] ORS;  Service: General;  Laterality: N/A;  . DILATION AND CURETTAGE OF UTERUS      Family History  Problem Relation Age of Onset  . Mitral valve prolapse Mother   . Arrhythmia Mother   . Hypertension Mother   . Diabetes Mother   . Hypertension Father   . Hyperlipidemia Father   . Heart disease Maternal Grandfather   . Lung cancer Maternal Grandfather   . Cancer Maternal Grandfather   . Diabetes Brother   . Heart disease Maternal Aunt   . Diabetes Maternal Aunt   . Depression Maternal Aunt   . Cancer Maternal Grandmother  ovarian melanoma    Social History   Social History  . Marital status: Married    Spouse name: N/A  . Number of children: 2  . Years of education: N/A   Occupational History  . Homemaker    Social History Main Topics  . Smoking status: Former Smoker    Packs/day: 0.25    Years: 1.00    Types: Cigarettes    Quit date: 03/13/1998  . Smokeless tobacco: Never Used  . Alcohol use No  . Drug use: No  . Sexual activity: Not on file   Other Topics Concern  . Not on file   Social History Narrative          Review of Systems Didn't do the urine testing Blood pressure still running good No new rash Still with diarrhea Appetite is okay    Objective:   Physical Exam    Constitutional: She appears well-developed. No distress.  HENT:  Mouth/Throat: Oropharynx is clear and moist. No oropharyngeal exudate.  Some maxillary sinus tenderness Marked nasal inflammation ---especially on left TMs normal  Neck: No thyromegaly present.  Pulmonary/Chest: Effort normal. She has no rales.  Slightly tight with widespread exp wheezing  Lymphadenopathy:    She has no cervical adenopathy.          Assessment & Plan:

## 2016-11-09 NOTE — Telephone Encounter (Signed)
Please check on her Monday morning 

## 2016-11-11 ENCOUNTER — Ambulatory Visit: Payer: BLUE CROSS/BLUE SHIELD | Admitting: Internal Medicine

## 2016-11-11 NOTE — Telephone Encounter (Signed)
Spoke to pt. She said she is still having some wheezing and coughing stuff up. Still has headaches. Very tired. She said her heart is still racing even off of Prednisone.

## 2016-11-11 NOTE — Telephone Encounter (Signed)
Tried to call pt. No VM

## 2016-11-11 NOTE — Telephone Encounter (Signed)
She can try tylenol for the headache and cough syrup. [name] is not improving, will need to be rechecked (though with the negative CXR, I would hope she will slowly improve over the next few days)

## 2016-11-11 NOTE — Telephone Encounter (Signed)
CRM created for [facility]

## 2016-11-12 ENCOUNTER — Emergency Department: Payer: BLUE CROSS/BLUE SHIELD

## 2016-11-12 ENCOUNTER — Ambulatory Visit: Payer: Self-pay | Admitting: *Deleted

## 2016-11-12 ENCOUNTER — Emergency Department
Admission: EM | Admit: 2016-11-12 | Discharge: 2016-11-12 | Disposition: A | Discharge status: Home / Self Care | Payer: BLUE CROSS/BLUE SHIELD | Attending: Emergency Medicine | Admitting: Emergency Medicine

## 2016-11-12 DIAGNOSIS — R0602 Shortness of breath: Secondary | ICD-10-CM | POA: Diagnosis not present

## 2016-11-12 DIAGNOSIS — I10 Essential (primary) hypertension: Secondary | ICD-10-CM | POA: Diagnosis not present

## 2016-11-12 DIAGNOSIS — Z79899 Other long term (current) drug therapy: Secondary | ICD-10-CM | POA: Insufficient documentation

## 2016-11-12 DIAGNOSIS — Z7982 Long term (current) use of aspirin: Secondary | ICD-10-CM | POA: Insufficient documentation

## 2016-11-12 DIAGNOSIS — Z87891 Personal history of nicotine dependence: Secondary | ICD-10-CM | POA: Insufficient documentation

## 2016-11-12 DIAGNOSIS — J40 Bronchitis, not specified as acute or chronic: Secondary | ICD-10-CM | POA: Diagnosis not present

## 2016-11-12 DIAGNOSIS — R05 Cough: Secondary | ICD-10-CM | POA: Diagnosis not present

## 2016-11-12 DIAGNOSIS — R519 Headache, unspecified: Secondary | ICD-10-CM

## 2016-11-12 DIAGNOSIS — R51 Headache: Secondary | ICD-10-CM | POA: Insufficient documentation

## 2016-11-12 DIAGNOSIS — R059 Cough, unspecified: Secondary | ICD-10-CM

## 2016-11-12 DIAGNOSIS — R531 Weakness: Secondary | ICD-10-CM | POA: Diagnosis present

## 2016-11-12 DIAGNOSIS — D649 Anemia, unspecified: Secondary | ICD-10-CM | POA: Insufficient documentation

## 2016-11-12 LAB — CBC WITH DIFFERENTIAL/PLATELET
BASOS PCT: 0 %
Basophils Absolute: 0 10*3/uL (ref 0–0.1)
EOS ABS: 0.3 10*3/uL (ref 0–0.7)
Eosinophils Relative: 3 %
HCT: 43.5 % (ref 35.0–47.0)
Hemoglobin: 15 g/dL (ref 12.0–16.0)
Lymphocytes Relative: 33 %
Lymphs Abs: 4.4 10*3/uL — ABNORMAL HIGH (ref 1.0–3.6)
MCH: 29.3 pg (ref 26.0–34.0)
MCHC: 34.4 g/dL (ref 32.0–36.0)
MCV: 85.1 fL (ref 80.0–100.0)
MONOS PCT: 6 %
Monocytes Absolute: 0.8 10*3/uL (ref 0.2–0.9)
NEUTROS PCT: 58 %
Neutro Abs: 8 10*3/uL — ABNORMAL HIGH (ref 1.4–6.5)
Platelets: 374 10*3/uL (ref 150–440)
RBC: 5.12 MIL/uL (ref 3.80–5.20)
RDW: 13 % (ref 11.5–14.5)
WBC: 13.6 10*3/uL — AB (ref 3.6–11.0)

## 2016-11-12 LAB — BASIC METABOLIC PANEL
Anion gap: 7 (ref 5–15)
BUN: 14 mg/dL (ref 6–20)
CALCIUM: 9.7 mg/dL (ref 8.9–10.3)
CHLORIDE: 107 mmol/L (ref 101–111)
CO2: 24 mmol/L (ref 22–32)
CREATININE: 0.67 mg/dL (ref 0.44–1.00)
GFR calc non Af Amer: >60 mL/min (ref >60)
Glucose, Bld: 94 mg/dL (ref 65–99)
Potassium: 4 mmol/L (ref 3.5–5.1)
SODIUM: 138 mmol/L (ref 135–145)

## 2016-11-12 MED ORDER — BENZONATATE 100 MG PO CAPS: 100.0000 mg | ORAL_CAPSULE | Freq: Four times a day (QID) | ORAL | 0 refills | Status: DC | PRN

## 2016-11-12 MED ORDER — IPRATROPIUM-ALBUTEROL 0.5-2.5 (3) MG/3ML IN SOLN
3.0000 mL | Freq: Once | RESPIRATORY_TRACT | Status: AC
  Administered 2016-11-12: 3 mL via RESPIRATORY_TRACT
  Filled 2016-11-12 (×2): qty 3

## 2016-11-12 MED ORDER — ALBUTEROL SULFATE HFA 108 (90 BASE) MCG/ACT IN AERS: 2.0000 | INHALATION_SPRAY | Freq: Four times a day (QID) | RESPIRATORY_TRACT | 0 refills | Status: DC | PRN

## 2016-11-12 MED ORDER — BUTALBITAL-APAP-CAFFEINE 50-325-40 MG PO TABS: 1.0000 | ORAL_TABLET | Freq: Four times a day (QID) | ORAL | 0 refills | Status: DC | PRN

## 2016-11-12 MED ORDER — BUTALBITAL-APAP-CAFFEINE 50-325-40 MG PO TABS
1.0000 | ORAL_TABLET | Freq: Once | ORAL | Status: AC
  Administered 2016-11-12: 1 via ORAL
  Filled 2016-11-12: qty 1

## 2016-11-12 NOTE — Discharge Instructions (Signed)
Please seek medical attention for any high fevers, chest pain, shortness of breath, change in behavior, persistent vomiting, bloody stool or any other new or concerning symptoms.  

## 2016-11-12 NOTE — Telephone Encounter (Signed)
Please check on her tomorrow 

## 2016-11-12 NOTE — Telephone Encounter (Signed)
  Reason for Disposition . Dizziness, lightheadedness, or weakness  Answer Assessment - Initial Assessment Questions 1. DESCRIPTION: "Please describe your heart rate or heart beat that you are having" (e.g., fast/slow, regular/irregular, skipped or extra beats, "palpitations")  feels     Jittery    And   Weak    2. ONSET: "When did it start?" (Minutes, hours or days)     Symptoms   Over   2   Weeks   Worse over last  Weak   3. DURATION: "How long does it last" (e.g., seconds, minutes, hours)      Worse   When  Stands  Up    4. PATTERN "Does it come and go, or has it been constant since it started?"  "Does it get worse with exertion?"   "Are you feeling it now?"     Gets   Worse   On  Exertion   5. TAP: "Using your hand, can you tap out what you are feeling on a chair or table in front of you, so that I can hear?" (Note: not all patients can do this)       yes 6. HEART RATE: "Can you tell me your heart rate?" "How many beats in 15 seconds?"  (Note: not all patients can do this)        86    bp    138  /98   Taken  By  Pt   7. RECURRENT SYMPTOM: "Have you ever had this before?" If so, ask: "When was the last time?" and "What happened that time?"     Yes   8. CAUSE: "What do you think is causing the palpitations?"     Maybe   Bronchitis  9. CARDIAC HISTORY: "Do you have any history of heart disease?" (e.g., heart attack, angina, bypass surgery, angioplasty, arrhy     no 10. OTHER SYMPTOMS: "Do you have any other symptoms?" (e.g., dizziness, chest pain, sweating, difficulty breathing)   No   Chest  Pain    Dizzy   Jittery   And   Weak   And  Exhausted    11. PREGNANCY: "Is there any chance you are pregnant?" "When was your last menstrual period?"   lmp  1-2  Days  Ago  Protocols used: HEART RATE AND HEARTBEAT QUESTIONS-A-AH

## 2016-11-12 NOTE — ED Triage Notes (Signed)
Pt in with co cough and shortness of breath for 2 weeks hx of bronchitis. Was here Thursday and given rocephin but unsure of dx. Pt also on zpack by pmd on Thursday and dx with bronchitis.

## 2016-11-12 NOTE — Telephone Encounter (Signed)
Brendia Sacks RN also noted;PT ADVISED TO GO TO ER  (Routing comment)

## 2016-11-12 NOTE — ED Provider Notes (Signed)
[facility] Emergency Department Provider Note   ____________________________________________   I have reviewed the triage vital signs and the nursing notes.   HISTORY  Chief Complaint Shortness of Breath and Cough   History limited by: Not Limited   HPI Anita Massey is a 36 y.o. female who presents to the emergency department today for continued cough and weakness  DURATION:2 weeks TIMING: constant SEVERITY: severe QUALITY: non bloody cough. Wheezing. CONTEXT: patient states symptoms have been going on for two weeks. Has seen her primary care provider and emergency department [place]. MODIFYING FACTORS: did have slight improvement with duoneb. Denies improvement after zpak ASSOCIATED SYMPTOMS: had an episode of jitteriness today. States her body was shaking. No recent fevers.   Per medical record review patient has a history of emergency department visit 4 days ago.  Past Medical History:  Diagnosis Date  . Anemia   . Anxiety state, unspecified   . 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 Clotting disorder ([place])   . GERD (gastroesophageal reflux disease)    OCC  . 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 Hypertension   . Multiple joint pain 09/04/2015  . Obesity   . Rosacea   . Undiagnosed cardiac murmurs   . Unspecified essential hypertension     Patient Active Problem List   Diagnosis Date Noted  . Asthmatic bronchitis 11/08/2016  . Adrenal adenoma 09/11/2016  . Panic anxiety syndrome 07/23/2016  . Allergic urticaria   . Multiple joint pain 09/04/2015  . Rosacea 08/15/2015  . 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 Palpitations 04/09/2011    Past Surgical History:  Procedure Laterality Date  . APPENDECTOMY  2009   [facility]  . CESAREAN SECTION  2016  . CHOLECYSTECTOMY N/A 03/20/2016   Procedure: LAPAROSCOPIC CHOLECYSTECTOMY;  Surgeon: Jules Husbands, MD;  Location: [facility] ORS;  Service: General;   Laterality: N/A;  . DILATION AND CURETTAGE OF UTERUS      Prior to Admission medications   Medication Sig Start Date End Date Taking? Authorizing Provider  aspirin-acetaminophen-caffeine (EXCEDRIN MIGRAINE) [phone] MG tablet Take 1 tablet by mouth every 6 (six) hours as needed for headache.    [provider]  azithromycin (ZITHROMAX Z-PAK) 250 MG tablet Take 2 tablets (500 mg) on  Day 1,  followed by 1 tablet (250 mg) once daily on Days 2 through 5. 11/08/16   Venia Carbon, MD  Calcium Carbonate-Simethicone (ALKA-SELTZER HEARTBURN + GAS PO) Take 1 tablet by mouth as needed.    [provider]  doxycycline (VIBRAMYCIN) 100 MG capsule Take 100 mg by mouth daily as needed (rosacea).     [provider]  fexofenadine (ALLEGRA) 180 MG tablet Take 1 tablet (180 mg total) by mouth daily. 11/08/16   Venia Carbon, MD  ibuprofen (ADVIL,MOTRIN) 200 MG tablet Take 200-400 mg by mouth every 6 (six) hours as needed for mild pain (depends on pain level if takes 1-2 tablets).    [provider]  metoprolol succinate (TOPROL-XL) 100 MG 24 hr tablet Take 1 tablet (100 mg total) by mouth daily. Take with or immediately following a meal. 07/23/16   Viviana Simpler I, MD  ondansetron (ZOFRAN-ODT) 4 MG disintegrating tablet DISSOLVE 1 TABLET ON THE TONGUE EVERY 8 HOURS AS NEEDED FOR NAUSEA OR VOMITING 02/17/16   [provider]  Pediatric Multivit-Minerals-C (RA GUMMY VITAMINS & MINERALS) CHEW Chew 2 tablets by mouth daily. Women's gummies  [provider]  predniSONE (DELTASONE) 20 MG tablet Take 1 tablet (20 mg total) by mouth 2 (two) times daily with a meal. 11/08/16   Venia Carbon, MD  sertraline (ZOLOFT) 100 MG tablet Take 0.5 tablets (50 mg total) by mouth daily. 11/08/16   Venia Carbon, MD    Allergies Biaxin [clarithromycin]; Toradol [ketorolac tromethamine]; Morphine and related; and Tape  Family History  Problem Relation Age of  Onset  . Mitral valve prolapse Mother   . Arrhythmia Mother   . Hypertension Mother   . Diabetes Mother   . Hypertension Father   . Hyperlipidemia Father   . Heart disease Maternal Grandfather   . Lung cancer Maternal Grandfather   . Cancer Maternal Grandfather   . Diabetes Brother   . Heart disease Maternal Aunt   . Diabetes Maternal Aunt   . Depression Maternal Aunt   . Cancer Maternal Grandmother        ovarian melanoma    Social History Social History  Substance Use Topics  . Smoking status: Former Smoker    Packs/day: 0.25    Years: 1.00    Types: Cigarettes    Quit date: 03/13/1998  . Smokeless tobacco: Never Used  . Alcohol use No    Review of Systems Constitutional: Positive for generalized weakness. Eyes: No visual changes. ENT: No sore throat. Cardiovascular: Denies chest pain. Respiratory: Positive for shortness of breath. Gastrointestinal: No abdominal pain.  No nausea, no vomiting.  No diarrhea.   Genitourinary: Negative for dysuria. Musculoskeletal: Negative for back pain. Skin: Negative for rash. Neurological: Negative for headaches, focal weakness or numbness.  ____________________________________________   PHYSICAL EXAM:  VITAL SIGNS: ED Triage Vitals [11/12/16 1957]  Enc Vitals Group     BP 134/84     Pulse Rate 100     Resp 18     Temp 98.2 F (36.8 C)     Temp Source Oral     SpO2 97 %     Weight 180 lb (81.6 kg)     Height 5\' 2"  (1.575 m)     Head Circumference      Peak Flow      Pain Score 9   Constitutional: Alert and oriented. Well appearing and in no distress. Eyes: Conjunctivae are normal.  ENT   Head: Normocephalic and atraumatic.   Nose: No congestion/rhinnorhea.   Mouth/Throat: Mucous membranes are moist.   Neck: No stridor. Hematological/Lymphatic/Immunilogical: No cervical lymphadenopathy. Cardiovascular: Normal rate, regular rhythm.  No murmurs, rubs, or gallops.  Respiratory: Normal respiratory effort  without tachypnea nor retractions. Diffuse expiratory wheezing. Occasional dry cough. Gastrointestinal: Soft and non tender. No rebound. No guarding.  Genitourinary: Deferred Musculoskeletal: Normal range of motion in all extremities. No lower extremity edema. Neurologic:  Normal speech and language. No gross focal neurologic deficits are appreciated.  Skin:  Skin is warm, dry and intact. No rash noted. Psychiatric: Mood and affect are normal. Speech and behavior are normal. Patient exhibits appropriate insight and judgment.  ____________________________________________    LABS (pertinent positives/negatives)  BMP wnl CBC wbc 13.6, hgb 15.0  ____________________________________________   EKG  None  ____________________________________________    RADIOLOGY  CXR No acute disase  ____________________________________________   PROCEDURES  Procedures  ____________________________________________   INITIAL IMPRESSION / ASSESSMENT AND PLAN / ED COURSE  Pertinent labs & imaging results that were available during my care of the patient were reviewed by me and considered in my medical decision making (see chart for  details).  Differential includes, but is not limited to, viral syndrome, bronchitis including COPD exacerbation, pneumonia, reactive airway disease including asthma, CHF including exacerbation with or without pulmonary/interstitial edema, pneumothorax, ACS, thoracic trauma, and pulmonary embolism. Patient's clinical exam and workup most consistent with continued bronchitis.  She did feel better after DuoNeb treatment.  Will prescribe cough medication, albuterol inhaler as well as Fioricet.  Discussed plan with patient.  Discussed return precautions with patient.  ____________________________________________   FINAL CLINICAL IMPRESSION(S) / ED DIAGNOSES  Final diagnoses:  Bronchitis  Cough  Bad headache     Note: This dictation was prepared with Dragon  dictation. Any transcriptional errors that result from this process are unintentional     Nance Pear, MD 11/12/16 2242

## 2016-11-12 NOTE — ED Notes (Signed)
Pt to the er for cough, she is unsure if pne or bronchitis. POt seen here on Friday but doesn't know what her diagnosis was. Pt seen by her MD and dx with bronchitis. MD RX zpack, and prednisone but she cant take it. Pt says she feels stranage and cant take the prednisone. Pt not taking any tylenol or motrin. Denies fever.

## 2016-11-13 NOTE — Telephone Encounter (Signed)
Spoke to pt

## 2016-11-13 NOTE — Telephone Encounter (Signed)
Pt said she went back to the ER last night and was given another Neb treatment and an inhaler for home. She is feeling a little better today. Some labs were done. CXR was repeated which was clear.

## 2016-11-13 NOTE — Telephone Encounter (Signed)
Let her know that I checked it all out and nothing worrisome was found. Hopefully she will finally start to feel better

## 2016-12-01 ENCOUNTER — Emergency Department
Admission: EM | Admit: 2016-12-01 | Discharge: 2016-12-01 | Disposition: A | Discharge status: Home / Self Care | Payer: BLUE CROSS/BLUE SHIELD | Attending: Emergency Medicine | Admitting: Emergency Medicine

## 2016-12-01 ENCOUNTER — Other Ambulatory Visit: Payer: Self-pay

## 2016-12-01 ENCOUNTER — Encounter: Payer: Self-pay | Admitting: Emergency Medicine

## 2016-12-01 ENCOUNTER — Emergency Department: Payer: BLUE CROSS/BLUE SHIELD

## 2016-12-01 DIAGNOSIS — I10 Essential (primary) hypertension: Secondary | ICD-10-CM | POA: Insufficient documentation

## 2016-12-01 DIAGNOSIS — F329 Major depressive disorder, single episode, unspecified: Secondary | ICD-10-CM | POA: Insufficient documentation

## 2016-12-01 DIAGNOSIS — Z87891 Personal history of nicotine dependence: Secondary | ICD-10-CM | POA: Diagnosis not present

## 2016-12-01 DIAGNOSIS — Z79899 Other long term (current) drug therapy: Secondary | ICD-10-CM | POA: Diagnosis not present

## 2016-12-01 DIAGNOSIS — F419 Anxiety disorder, unspecified: Secondary | ICD-10-CM | POA: Insufficient documentation

## 2016-12-01 DIAGNOSIS — F32A Depression, unspecified: Secondary | ICD-10-CM

## 2016-12-01 DIAGNOSIS — R059 Cough, unspecified: Secondary | ICD-10-CM

## 2016-12-01 DIAGNOSIS — R05 Cough: Secondary | ICD-10-CM | POA: Insufficient documentation

## 2016-12-01 LAB — COMPREHENSIVE METABOLIC PANEL
ALT: 18 U/L (ref 14–54)
AST: 22 U/L (ref 15–41)
Albumin: 4.2 g/dL (ref 3.5–5.0)
Alkaline Phosphatase: 128 U/L — ABNORMAL HIGH (ref 38–126)
Anion gap: 10 (ref 5–15)
BILIRUBIN TOTAL: 0.6 mg/dL (ref 0.3–1.2)
BUN: 13 mg/dL (ref 6–20)
CO2: 21 mmol/L — AB (ref 22–32)
Calcium: 9.7 mg/dL (ref 8.9–10.3)
Chloride: 106 mmol/L (ref 101–111)
Creatinine, Ser: 0.55 mg/dL (ref 0.44–1.00)
GLUCOSE: 121 mg/dL — AB (ref 65–99)
POTASSIUM: 3.5 mmol/L (ref 3.5–5.1)
Sodium: 137 mmol/L (ref 135–145)
TOTAL PROTEIN: 7.6 g/dL (ref 6.5–8.1)

## 2016-12-01 LAB — CBC WITH DIFFERENTIAL/PLATELET
BASOS ABS: 0.3 10*3/uL — AB (ref 0–0.1)
Basophils Relative: 2 %
EOS PCT: 2 %
Eosinophils Absolute: 0.2 10*3/uL (ref 0–0.7)
HEMATOCRIT: 41.1 % (ref 35.0–47.0)
Hemoglobin: 14.3 g/dL (ref 12.0–16.0)
LYMPHS ABS: 3 10*3/uL (ref 1.0–3.6)
LYMPHS PCT: 23 %
MCH: 30 pg (ref 26.0–34.0)
MCHC: 34.9 g/dL (ref 32.0–36.0)
MCV: 85.9 fL (ref 80.0–100.0)
MONO ABS: 0.9 10*3/uL (ref 0.2–0.9)
Monocytes Relative: 7 %
NEUTROS ABS: 9 10*3/uL — AB (ref 1.4–6.5)
Neutrophils Relative %: 66 %
Platelets: 312 10*3/uL (ref 150–440)
RBC: 4.79 MIL/uL (ref 3.80–5.20)
RDW: 13.3 % (ref 11.5–14.5)
WBC: 13.4 10*3/uL — ABNORMAL HIGH (ref 3.6–11.0)

## 2016-12-01 LAB — URINALYSIS, COMPLETE (UACMP) WITH MICROSCOPIC
BACTERIA UA: NONE SEEN
BILIRUBIN URINE: NEGATIVE
Glucose, UA: NEGATIVE mg/dL
Ketones, ur: NEGATIVE mg/dL
Leukocytes, UA: NEGATIVE
Nitrite: NEGATIVE
PH: 5 (ref 5.0–8.0)
Protein, ur: NEGATIVE mg/dL
SPECIFIC GRAVITY, URINE: 1.008 (ref 1.005–1.030)

## 2016-12-01 LAB — POCT PREGNANCY, URINE: Preg Test, Ur: NEGATIVE

## 2016-12-01 LAB — TSH: TSH: 2.191 u[IU]/mL (ref 0.350–4.500)

## 2016-12-01 LAB — TROPONIN I: Troponin I: <0.03 ng/mL (ref <0.03)

## 2016-12-01 MED ORDER — SODIUM CHLORIDE 0.9 % IV BOLUS (SEPSIS)
500.0000 mL | Freq: Once | INTRAVENOUS | Status: AC
  Administered 2016-12-01: 500 mL via INTRAVENOUS

## 2016-12-01 MED ORDER — HYDROCOD POLST-CPM POLST ER 10-8 MG/5ML PO SUER
5.0000 mL | Freq: Once | ORAL | Status: AC
  Administered 2016-12-01: 5 mL via ORAL
  Filled 2016-12-01: qty 5

## 2016-12-01 MED ORDER — ONDANSETRON HCL 4 MG/2ML IJ SOLN
4.0000 mg | Freq: Once | INTRAMUSCULAR | Status: AC
  Administered 2016-12-01: 4 mg via INTRAVENOUS
  Filled 2016-12-01: qty 2

## 2016-12-01 MED ORDER — HYDROCOD POLST-CPM POLST ER 10-8 MG/5ML PO SUER: 5.0000 mL | Freq: Two times a day (BID) | ORAL | 0 refills | Status: DC

## 2016-12-01 NOTE — ED Triage Notes (Signed)
Patient ambulating to stat desk wit complaint of elevated blood pressure. Patient reports that her blood pressure was 10/108 at home. Patient states that she has a history of hypertension and it is usually controlled by medications.

## 2016-12-01 NOTE — BH Assessment (Signed)
Patient reports not feeling good all the time and says she feels overwhelmed by her medical conditions.  Patient denies SI/HI and auditory/visual hallucinations.  Pt denies drug/alcohol use.  Provided patient with outpatient mental health resources to address anxiety attacks and symptoms of depression

## 2016-12-01 NOTE — ED Notes (Signed)
Lengthy discussion with pt regarding healthcare and morale failing due to feeling un appreciated at home, pt repeats "I'm tired of feeling tired", pt unable to report HTN as reason for today's visit, Dr Beather Arbour notified, consultation to TTS for resources for counseling

## 2016-12-01 NOTE — ED Notes (Signed)
Pt reports "I feel really bad", unable to articulate pain or discomfort, Dr Beather Arbour notified

## 2016-12-01 NOTE — ED Notes (Addendum)
Pt reports waking at home and not feeling well took BP 180/108 pulse 119, pt reports eating hot dogs and cereal today,

## 2016-12-01 NOTE — Discharge Instructions (Signed)
1.  You may take Tussionex as needed for cough. 2.  Continue all medications as directed by your doctor. 3.  Return to the ER for worsening symptoms, persistent vomiting, difficulty breathing, feelings of hurting yourself or others, or other concerns.

## 2016-12-01 NOTE — ED Provider Notes (Signed)
[facility] Emergency Department Provider Note   ____________________________________________   First MD Initiated Contact with Patient 12/01/16 0148     (approximate)  I have reviewed the triage vital signs and the nursing notes.   HISTORY  Chief Complaint Hypertension    HPI Anita Massey is a 36 y.o. female who presents to the ED from home with a chief complaint of elevated blood pressure and "feeling funny".  Patient has had pneumonia and bronchitis for the past several weeks.  Still experiencing cough productive of yellow sputum and wheezing. States the only thing she ate today was hot dogs and cereal.  Awoke to use the restroom and "felt funny".  Took her blood pressure which was 181/108 with a pulse of 119.  Does not know her baseline blood pressures but does not think it is that high normally.  Denies associated fever, chills, headache, vision changes, neck pain, chest pain, shortness of breath, abdominal pain, vomiting, dysuria, diarrhea.  Does note some nausea.  Denies recent travel or trauma.   Past Medical History:  Diagnosis Date  . Anemia   . Anxiety state, unspecified   . Blood dyscrasia    PROTHROMBIN G 47096 HETEROZYGOSITY (FACTOR 2)  . Clotting disorder ([place])   . GERD (gastroesophageal reflux disease)    OCC  . 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 Hypertension   . Multiple joint pain 09/04/2015  . Obesity   . Rosacea   . Undiagnosed cardiac murmurs   . Unspecified essential hypertension     Patient Active Problem List   Diagnosis Date Noted  . Asthmatic bronchitis 11/08/2016  . Adrenal adenoma 09/11/2016  . Panic anxiety syndrome 07/23/2016  . Allergic urticaria   . Multiple joint pain 09/04/2015  . Rosacea 08/15/2015  . 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 Palpitations 04/09/2011    Past Surgical History:  Procedure Laterality Date  . APPENDECTOMY  2009   [facility]  . CESAREAN SECTION  2016  . DILATION AND  CURETTAGE OF UTERUS    . LAPAROSCOPIC CHOLECYSTECTOMY N/A 03/20/2016   Performed by Jules Husbands, MD at [facility] ORS    Prior to Admission medications   Medication Sig Start Date End Date Taking? Authorizing Provider  albuterol (PROVENTIL HFA;VENTOLIN HFA) 108 (90 Base) MCG/ACT inhaler Inhale 2 puffs into the lungs every 6 (six) hours as needed for wheezing or shortness of breath. 11/12/16  Yes Nance Pear, MD  aspirin-acetaminophen-caffeine [facility] MIGRAINE) [phone] MG tablet Take 1 tablet by mouth every 6 (six) hours as needed for headache.   Yes [provider]  Calcium Carbonate-Simethicone (ALKA-SELTZER HEARTBURN + GAS PO) Take 1 tablet by mouth as needed.   Yes [provider]  doxycycline (VIBRAMYCIN) 100 MG capsule Take 100 mg by mouth daily as needed (rosacea).    Yes [provider]  fexofenadine (ALLEGRA) 180 MG tablet Take 1 tablet (180 mg total) by mouth daily. 11/08/16  Yes Venia Carbon, MD  ibuprofen (ADVIL,MOTRIN) 200 MG tablet Take 200-400 mg by mouth every 6 (six) hours as needed for mild pain (depends on pain level if takes 1-2 tablets).   Yes [provider]  metoprolol succinate (TOPROL-XL) 100 MG 24 hr tablet Take 1 tablet (100 mg total) by mouth daily. Take with or immediately following a meal. 07/23/16  Yes Viviana Simpler I, MD  ondansetron (ZOFRAN-ODT) 4 MG disintegrating tablet DISSOLVE 1 TABLET ON THE TONGUE EVERY 8 HOURS AS NEEDED FOR NAUSEA OR VOMITING  02/17/16  Yes [provider]  Pediatric Multivit-Minerals-C (RA GUMMY VITAMINS & MINERALS) CHEW Chew 2 tablets by mouth daily. Women's gummies   Yes [provider]  sertraline (ZOLOFT) 100 MG tablet Take 0.5 tablets (50 mg total) by mouth daily. 11/08/16  Yes Venia Carbon, MD  chlorpheniramine-HYDROcodone [facility] PENNKINETIC ER) 10-8 MG/5ML SUER Take 5 mLs 2 (two) times daily by mouth. 12/01/16   Paulette Blanch, MD    Allergies Biaxin  [clarithromycin]; Toradol [ketorolac tromethamine]; Prednisone; Morphine and related; and Tape  Family History  Problem Relation Age of Onset  . Mitral valve prolapse Mother   . Arrhythmia Mother   . Hypertension Mother   . Diabetes Mother   . Hypertension Father   . Hyperlipidemia Father   . Heart disease Maternal Grandfather   . Lung cancer Maternal Grandfather   . Cancer Maternal Grandfather   . Diabetes Brother   . Heart disease Maternal Aunt   . Diabetes Maternal Aunt   . Depression Maternal Aunt   . Cancer Maternal Grandmother        ovarian melanoma    Social History Social History   Tobacco Use  . Smoking status: Former Smoker    Packs/day: 0.25    Years: 1.00    Pack years: 0.25    Types: Cigarettes    Last attempt to quit: 03/13/1998    Years since quitting: 18.7  . Smokeless tobacco: Never Used  Substance Use Topics  . Alcohol use: No  . Drug use: No    Review of Systems  Constitutional: Positive for generalized malaise.  No fever/chills. Eyes: No visual changes. ENT: No sore throat. Cardiovascular: Denies chest pain. Respiratory: Positive for productive cough and wheezing.  Denies shortness of breath. Gastrointestinal: No abdominal pain.  Positive for nausea, no vomiting.  No diarrhea.  No constipation. Genitourinary: Negative for dysuria. Musculoskeletal: Negative for back pain. Skin: Negative for rash. Neurological: Negative for headaches, focal weakness or numbness.   ____________________________________________   PHYSICAL EXAM:  VITAL SIGNS: ED Triage Vitals  Enc Vitals Group     BP 12/01/16 0103 (!) 158/107     Pulse Rate 12/01/16 0103 91     Resp 12/01/16 0103 18     Temp 12/01/16 0103 98.6 F (37 C)     Temp Source 12/01/16 0103 Oral     SpO2 12/01/16 0103 98 %     Weight 12/01/16 0103 180 lb (81.6 kg)     Height 12/01/16 0103 _0  (1.575 m)     Head Circumference --      Peak Flow --      Pain Score 12/01/16 0102 0     Pain  Loc --      Pain Edu? --      Excl. in [place]? --     Constitutional: Alert and oriented. Well appearing and in no acute distress. Eyes: Conjunctivae are normal. PERRL. EOMI. Head: Atraumatic. Nose: No congestion/rhinnorhea. Mouth/Throat: Mucous membranes are moist.  Oropharynx non-erythematous. Neck: No stridor.   Cardiovascular: Normal rate, regular rhythm. Grossly normal heart sounds.  Good peripheral circulation. Respiratory: Normal respiratory effort.  No retractions. Lungs with occasional rhonchi which clears with respiration.  Active dry cough.   Gastrointestinal: Soft and nontender. No distention. No abdominal bruits. No CVA tenderness. Musculoskeletal: No lower extremity tenderness nor edema.  No joint effusions. Neurologic:  Normal speech and language. No gross focal neurologic deficits are appreciated. No gait instability. Skin:  Skin is warm,  dry and intact. No rash noted.  No petechiae. Psychiatric: Mood and affect are normal. Speech and behavior are normal.  ____________________________________________   LABS (all labs ordered are listed, but only abnormal results are displayed)  Labs Reviewed  CBC WITH DIFFERENTIAL/PLATELET - Abnormal; Notable for the following components:      Result Value   WBC 13.4 (*)    Neutro Abs 9.0 (*)    Basophils Absolute 0.3 (*)    All other components within normal limits  COMPREHENSIVE METABOLIC PANEL - Abnormal; Notable for the following components:   CO2 21 (*)    Glucose, Bld 121 (*)    Alkaline Phosphatase 128 (*)    All other components within normal limits  URINALYSIS, COMPLETE (UACMP) WITH MICROSCOPIC - Abnormal; Notable for the following components:   Color, Urine STRAW (*)    APPearance CLEAR (*)    Hgb urine dipstick MODERATE (*)    Squamous Epithelial / LPF 0-5 (*)    All other components within normal limits  TROPONIN I  TSH  POC URINE PREG, ED  POCT PREGNANCY, URINE    ____________________________________________  EKG  ED ECG REPORT I, SUNG,JADE J, the attending physician, personally viewed and interpreted this ECG.   Date: 12/01/2016  EKG Time: 0226  Rate: 89  Rhythm: normal EKG, normal sinus rhythm  Axis: LAD  Intervals:none  ST&T Change: T wave inversion in inferior lateral leads No significant change from 02/16/2016 ____________________________________________  RADIOLOGY  Dg Chest 2 View  Result Date: 12/01/2016 CLINICAL DATA:  Subacute onset of cough. EXAM: CHEST  2 VIEW COMPARISON:  Chest radiograph performed 11/12/2016 FINDINGS: The lungs are well-aerated and clear. There is no evidence of focal opacification, pleural effusion or pneumothorax. The heart is normal in size; the mediastinal contour is within normal limits. No acute osseous abnormalities are seen. IMPRESSION: No acute cardiopulmonary process seen. Electronically Signed   By: Garald Balding M.D.   On: 12/01/2016 02:10    ____________________________________________   PROCEDURES  Procedure(s) performed: None  Procedures  Critical Care performed: No  ____________________________________________   INITIAL IMPRESSION / ASSESSMENT AND PLAN / ED COURSE  As part of my medical decision making, I reviewed the following data within the [place] notes reviewed and incorporated, Labs reviewed, EKG interpreted, Radiograph reviewed  and Notes from prior ED visits.   36 year old female with hypertension, on Toprol XL 100 mg daily who presents with elevated blood pressure and generalized malaise in the setting of recent bronchitis.  Blood pressure currently 121/80s.  Patient does become tachycardic to 110s with coughing.  Will obtain screening lab work including TSH, chest x-ray, EKG and initiate IV fluid resuscitation.  Clinical Course as of Dec 02 706  Nancy Fetter Dec 01, 2016  0540 Patient kept coming up with a myriad of various medical complaints.  On  further evaluation, patient has been depressed for the past 2.5 years.  Feels like her PCP is not managing her depression well with Lexapro.  Denies active SI/HI/AH/VH.  Would like to speak with behavioral medicine counselor.  Will ask TTS to evaluate.  [JS]  0700 Patient was seen by TTS and given referrals for local mental health follow-up.  Will discharge home with prescription for Tussionex.  Strict return precautions given.  Patient verbalizes understanding and agrees with plan of care.  [JS]    Clinical Course User Index [JS] Paulette Blanch, MD     ____________________________________________   FINAL CLINICAL IMPRESSION(S) / ED DIAGNOSES  Final diagnoses:  Essential hypertension  Depression, unspecified depression type  Cough     ED Discharge Orders        Ordered    chlorpheniramine-HYDROcodone (TUSSIONEX PENNKINETIC ER) 10-8 MG/5ML SUER  2 times daily     12/01/16 0702       Note:  This document was prepared using Dragon voice recognition software and may include unintentional dictation errors.    Paulette Blanch, MD 12/01/16 [id number]

## 2016-12-01 NOTE — ED Triage Notes (Signed)
Pt says she woke to use the bathroom and was feeling "funny"; blood pressure at home 181/108; HR 119; pt says she feels nauseated only;

## 2016-12-03 ENCOUNTER — Telehealth: Payer: Self-pay

## 2016-12-03 NOTE — Telephone Encounter (Signed)
Spoke to pt. She said she is doing a little better. Has not been checking her blood pressure. She is not sure her insurance will cover a behavioral health professional. Patient does not agree that she has anxiety and depression although people around her tell her she is. She said she has regular everyday stressors like finances. She does not think talking to a therapist would help her.   She is asking why would her BP be elevated if she is on metoprolol.   She is aware it will be Monday before I reach back out to her.

## 2016-12-04 NOTE — Telephone Encounter (Signed)
Left message to call office.  PEC, please advise pt of Dr Alla German message and make her an appt

## 2016-12-04 NOTE — Telephone Encounter (Signed)
CRM [id number] created

## 2016-12-04 NOTE — Telephone Encounter (Signed)
BP can be elevated when sick and stressed. Metoprolol may not be enough for her----though BP generally okay in our office. Should set up follow up with me in the next few weeks to review everything

## 2016-12-09 ENCOUNTER — Ambulatory Visit: Payer: BLUE CROSS/BLUE SHIELD | Admitting: Internal Medicine

## 2016-12-09 NOTE — Telephone Encounter (Signed)
Pt has appt today

## 2016-12-29 ENCOUNTER — Emergency Department
Admission: EM | Admit: 2016-12-29 | Discharge: 2016-12-29 | Disposition: A | Discharge status: Home / Self Care | Payer: BLUE CROSS/BLUE SHIELD | Attending: Emergency Medicine | Admitting: Emergency Medicine

## 2016-12-29 ENCOUNTER — Other Ambulatory Visit: Payer: Self-pay

## 2016-12-29 ENCOUNTER — Encounter: Payer: Self-pay | Admitting: Emergency Medicine

## 2016-12-29 DIAGNOSIS — I10 Essential (primary) hypertension: Secondary | ICD-10-CM | POA: Diagnosis not present

## 2016-12-29 DIAGNOSIS — R1010 Upper abdominal pain, unspecified: Secondary | ICD-10-CM | POA: Diagnosis not present

## 2016-12-29 DIAGNOSIS — R197 Diarrhea, unspecified: Secondary | ICD-10-CM | POA: Diagnosis not present

## 2016-12-29 DIAGNOSIS — R110 Nausea: Secondary | ICD-10-CM | POA: Diagnosis present

## 2016-12-29 DIAGNOSIS — Z87891 Personal history of nicotine dependence: Secondary | ICD-10-CM | POA: Insufficient documentation

## 2016-12-29 DIAGNOSIS — R112 Nausea with vomiting, unspecified: Secondary | ICD-10-CM

## 2016-12-29 DIAGNOSIS — Z79899 Other long term (current) drug therapy: Secondary | ICD-10-CM | POA: Diagnosis not present

## 2016-12-29 LAB — COMPREHENSIVE METABOLIC PANEL
ALK PHOS: 117 U/L (ref 38–126)
ALT: 19 U/L (ref 14–54)
ANION GAP: 9 (ref 5–15)
AST: 24 U/L (ref 15–41)
Albumin: 3.8 g/dL (ref 3.5–5.0)
BILIRUBIN TOTAL: 0.6 mg/dL (ref 0.3–1.2)
BUN: 11 mg/dL (ref 6–20)
CALCIUM: 9.6 mg/dL (ref 8.9–10.3)
CO2: 22 mmol/L (ref 22–32)
Chloride: 107 mmol/L (ref 101–111)
Creatinine, Ser: 0.65 mg/dL (ref 0.44–1.00)
GLUCOSE: 118 mg/dL — AB (ref 65–99)
POTASSIUM: 3.3 mmol/L — AB (ref 3.5–5.1)
Sodium: 138 mmol/L (ref 135–145)
TOTAL PROTEIN: 7.5 g/dL (ref 6.5–8.1)

## 2016-12-29 LAB — CBC
HCT: 40.8 % (ref 35.0–47.0)
HEMOGLOBIN: 14.2 g/dL (ref 12.0–16.0)
MCH: 30.1 pg (ref 26.0–34.0)
MCHC: 34.8 g/dL (ref 32.0–36.0)
MCV: 86.7 fL (ref 80.0–100.0)
Platelets: 291 10*3/uL (ref 150–440)
RBC: 4.71 MIL/uL (ref 3.80–5.20)
RDW: 13.1 % (ref 11.5–14.5)
WBC: 11.2 10*3/uL — AB (ref 3.6–11.0)

## 2016-12-29 LAB — LIPASE, BLOOD: Lipase: 20 U/L (ref 11–51)

## 2016-12-29 MED ORDER — ONDANSETRON 4 MG PO TBDP
4.0000 mg | ORAL_TABLET | Freq: Once | ORAL | Status: AC | PRN
  Administered 2016-12-29: 4 mg via ORAL
  Filled 2016-12-29: qty 1

## 2016-12-29 MED ORDER — ONDANSETRON 4 MG PO TBDP: 4.0000 mg | ORAL_TABLET | Freq: Three times a day (TID) | ORAL | 0 refills | Status: DC | PRN

## 2016-12-29 MED ORDER — ONDANSETRON 4 MG PO TBDP
4.0000 mg | ORAL_TABLET | Freq: Once | ORAL | Status: AC
  Administered 2016-12-29: 4 mg via ORAL
  Filled 2016-12-29: qty 1

## 2016-12-29 NOTE — ED Provider Notes (Signed)
[facility] Emergency Department Provider Note  ____________________________________________   First MD Initiated Contact with Patient 12/29/16 [phone]     (approximate)  I have reviewed the triage vital signs and the nursing notes.   HISTORY  Chief Complaint Nausea; Leg Pain; and Hypertension   HPI Anita Massey is a 36 y.o. female who self presents to the emergency department with multiple complaints.  She said she woke up from her sleep this morning feeling nauseated and had an episode of diarrhea.  She became anxious about her mild to moderate diffuse abdominal discomfort and began to hyperventilate.  She then noted tingling in bilateral hands and she checked her blood pressure at home which was 187/100 and her heart rate was 130 so she took a home dose of metoprolol and came to the emergency department.  Her numbness and tingling are currently resolved.  She persists with some mild upper abdominal discomfort.  Her symptoms began suddenly.  They are worse with hyperventilation and improved with deep breathing.  Pain is nonradiating.  Past Medical History:  Diagnosis Date  . Anemia   . Anxiety state, unspecified   . 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 Clotting disorder ([place])   . GERD (gastroesophageal reflux disease)    OCC  . 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 Hypertension   . Multiple joint pain 09/04/2015  . Obesity   . Rosacea   . Undiagnosed cardiac murmurs   . Unspecified essential hypertension     Patient Active Problem List   Diagnosis Date Noted  . Asthmatic bronchitis 11/08/2016  . Adrenal adenoma 09/11/2016  . Panic anxiety syndrome 07/23/2016  . Allergic urticaria   . Multiple joint pain 09/04/2015  . Rosacea 08/15/2015  . 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 Palpitations 04/09/2011    Past Surgical History:  Procedure Laterality Date  . APPENDECTOMY  2009   [facility]  . CESAREAN SECTION   2016  . CHOLECYSTECTOMY N/A 03/20/2016   Procedure: LAPAROSCOPIC CHOLECYSTECTOMY;  Surgeon: Jules Husbands, MD;  Location: [facility] ORS;  Service: General;  Laterality: N/A;  . DILATION AND CURETTAGE OF UTERUS      Prior to Admission medications   Medication Sig Start Date End Date Taking? Authorizing Provider  aspirin-acetaminophen-caffeine (EXCEDRIN MIGRAINE) [phone] MG tablet Take 1 tablet by mouth every 6 (six) hours as needed for headache.   Yes [provider]  Calcium Carbonate-Simethicone (ALKA-SELTZER HEARTBURN + GAS PO) Take 1 tablet by mouth as needed.   Yes [provider]  doxycycline (VIBRAMYCIN) 100 MG capsule Take 100 mg by mouth daily as needed (rosacea).    Yes [provider]  ibuprofen (ADVIL,MOTRIN) 200 MG tablet Take 200-400 mg by mouth every 6 (six) hours as needed for mild pain (depends on pain level if takes 1-2 tablets).   Yes [provider]  metoprolol succinate (TOPROL-XL) 100 MG 24 hr tablet Take 1 tablet (100 mg total) by mouth daily. Take with or immediately following a meal. 07/23/16  Yes Viviana Simpler I, MD  ondansetron (ZOFRAN-ODT) 4 MG disintegrating tablet DISSOLVE 1 TABLET ON THE TONGUE EVERY 8 HOURS AS NEEDED FOR NAUSEA OR VOMITING 02/17/16  Yes [provider]  Pediatric Multivit-Minerals-C (RA GUMMY VITAMINS & MINERALS) CHEW Chew 2 tablets by mouth daily. Women's gummies   Yes [provider]  sertraline (ZOLOFT) 100 MG tablet Take 0.5 tablets (50 mg total) by mouth daily. 11/08/16  Yes  Venia Carbon, MD  ondansetron (ZOFRAN ODT) 4 MG disintegrating tablet Take 1 tablet (4 mg total) by mouth every 8 (eight) hours as needed for nausea or vomiting. 12/29/16   Darel Hong, MD    Allergies Biaxin [clarithromycin]; Toradol [ketorolac tromethamine]; Prednisone; Morphine and related; and Tape  Family History  Problem Relation Age of Onset  . Mitral valve prolapse Mother   . Arrhythmia Mother   .  Hypertension Mother   . Diabetes Mother   . Hypertension Father   . Hyperlipidemia Father   . Heart disease Maternal Grandfather   . Lung cancer Maternal Grandfather   . Cancer Maternal Grandfather   . Diabetes Brother   . Heart disease Maternal Aunt   . Diabetes Maternal Aunt   . Depression Maternal Aunt   . Cancer Maternal Grandmother        ovarian melanoma    Social History Social History   Tobacco Use  . Smoking status: Former Smoker    Packs/day: 0.25    Years: 1.00    Pack years: 0.25    Types: Cigarettes    Last attempt to quit: 03/13/1998    Years since quitting: 18.8  . Smokeless tobacco: Never Used  Substance Use Topics  . Alcohol use: No  . Drug use: No    Review of Systems Constitutional: No fever/chills Eyes: No visual changes. ENT: No sore throat. Cardiovascular: Denies chest pain. Respiratory: Denies shortness of breath. Gastrointestinal: Positive for abdominal pain.  Positive for nausea, no vomiting.  Positive for diarrhea.  No constipation. Genitourinary: Negative for dysuria. Musculoskeletal: Negative for back pain. Skin: Negative for rash. Neurological: Positive for hand tingling   ____________________________________________   PHYSICAL EXAM:  VITAL SIGNS: ED Triage Vitals  Enc Vitals Group     BP 12/29/16 0438 (!) 164/102     Pulse Rate 12/29/16 0438 (!) 107     Resp 12/29/16 0438 18     Temp 12/29/16 0438 97.8 F (36.6 C)     Temp Source 12/29/16 0438 Oral     SpO2 12/29/16 0438 96 %     Weight 12/29/16 0432 180 lb (81.6 kg)     Height 12/29/16 0432 5\' 2"  (1.575 m)     Head Circumference --      Peak Flow --      Pain Score 12/29/16 0433 7     Pain Loc --      Pain Edu? --      Excl. in [place]? --     Constitutional: Alert and oriented x4 appears somewhat anxious nontoxic no diaphoresis speaks in full clear sentences Eyes: PERRL EOMI. Head: Atraumatic. Nose: No congestion/rhinnorhea. Mouth/Throat: No trismus Neck: No stridor.    Cardiovascular: Tachycardic rate, regular rhythm. Grossly normal heart sounds.  Good peripheral circulation. Respiratory: Normal respiratory effort.  No retractions. Lungs CTAB and moving good air Gastrointestinal: Soft obese abdomen nontender no rebound or guarding no peritonitis no focality Musculoskeletal: No lower extremity edema   Neurologic:  Normal speech and language. No gross focal neurologic deficits are appreciated. Skin:  Skin is warm, dry and intact. No rash noted. Psychiatric: Mood and affect are normal. Speech and behavior are normal.    ____________________________________________   DIFFERENTIAL includes but not limited to  Panic attack, anxiety reaction, acute coronary syndrome, metabolic derangement, gastroenteritis, appendicitis ____________________________________________   LABS (all labs ordered are listed, but only abnormal results are displayed)  Labs Reviewed  COMPREHENSIVE METABOLIC PANEL - Abnormal; Notable for the following  components:      Result Value   Potassium 3.3 (*)    Glucose, Bld 118 (*)    All other components within normal limits  CBC - Abnormal; Notable for the following components:   WBC 11.2 (*)    All other components within normal limits  LIPASE, BLOOD    Blood work reviewed by me with low potassium and high white count which are consistent with stress response __________________________________________  EKG   ____________________________________________  RADIOLOGY   ____________________________________________   PROCEDURES  Procedure(s) performed: no  Procedures  Critical Care performed: no  Observation: no ____________________________________________   INITIAL IMPRESSION / ASSESSMENT AND PLAN / ED COURSE  Pertinent labs & imaging results that were available during my care of the patient were reviewed by me and considered in my medical decision making (see chart for details).  By the time I saw the patient  her symptoms were nearly completely resolved.  Her primary concern was the tingling in her hands and we discussed this is likely secondary to hyperventilation.  Her abdomen is benign and I do not believe she requires advanced imaging at this time as her symptoms have only been going on for about 3 hours.  Strict return precautions have been given and the patient verbalized understanding and agreement with the plan.      ____________________________________________   FINAL CLINICAL IMPRESSION(S) / ED DIAGNOSES  Final diagnoses:  Nausea and vomiting, intractability of vomiting not specified, unspecified vomiting type      NEW MEDICATIONS STARTED DURING THIS VISIT:  This SmartLink is deprecated. Use AVSMEDLIST instead to display the medication list for a patient.   Note:  This document was prepared using Dragon voice recognition software and may include unintentional dictation errors.     Darel Hong, MD 12/29/16 2249

## 2016-12-29 NOTE — Discharge Instructions (Signed)
Fortunately today your blood work was reassuring.  Please use your Zofran as needed for severe symptoms and return to the emergency department for any concerns.  It was a pleasure to take care of you today, and thank you for coming to our emergency department.  If you have any questions or concerns before leaving please ask the nurse to grab me and I'm more than happy to go through your aftercare instructions again.  If you were prescribed any opioid pain medication today such as Norco, Vicodin, Percocet, morphine, hydrocodone, or oxycodone please make sure you do not drive when you are taking this medication as it can alter your ability to drive safely.  If you have any concerns once you are home that you are not improving or are in fact getting worse before you can make it to your follow-up appointment, please do not hesitate to call 911 and come back for further evaluation.  Darel Hong, MD  Results for orders placed or performed during the hospital encounter of 12/29/16  Lipase, blood  Result Value Ref Range   Lipase 20 11 - 51 U/L  Comprehensive metabolic panel  Result Value Ref Range   Sodium 138 135 - 145 mmol/L   Potassium 3.3 (L) 3.5 - 5.1 mmol/L   Chloride 107 101 - 111 mmol/L   CO2 22 22 - 32 mmol/L   Glucose, Bld 118 (H) 65 - 99 mg/dL   BUN 11 6 - 20 mg/dL   Creatinine, Ser 0.65 0.44 - 1.00 mg/dL   Calcium 9.6 8.9 - 10.3 mg/dL   Total Protein 7.5 6.5 - 8.1 g/dL   Albumin 3.8 3.5 - 5.0 g/dL   AST 24 15 - 41 U/L   ALT 19 14 - 54 U/L   Alkaline Phosphatase 117 38 - 126 U/L   Total Bilirubin 0.6 0.3 - 1.2 mg/dL   GFR calc non Af Amer >60 >60 mL/min   GFR calc Af Amer >60 >60 mL/min   Anion gap 9 5 - 15  CBC  Result Value Ref Range   WBC 11.2 (H) 3.6 - 11.0 K/uL   RBC 4.71 3.80 - 5.20 MIL/uL   Hemoglobin 14.2 12.0 - 16.0 g/dL   HCT 40.8 35.0 - 47.0 %   MCV 86.7 80.0 - 100.0 fL   MCH 30.1 26.0 - 34.0 pg   MCHC 34.8 32.0 - 36.0 g/dL   RDW 13.1 11.5 - 14.5 %   Platelets 291 150 - 440 K/uL   Dg Chest 2 View  Result Date: 12/01/2016 CLINICAL DATA:  Subacute onset of cough. EXAM: CHEST  2 VIEW COMPARISON:  Chest radiograph performed 11/12/2016 FINDINGS: The lungs are well-aerated and clear. There is no evidence of focal opacification, pleural effusion or pneumothorax. The heart is normal in size; the mediastinal contour is within normal limits. No acute osseous abnormalities are seen. IMPRESSION: No acute cardiopulmonary process seen. Electronically Signed   By: Garald Balding M.D.   On: 12/01/2016 02:10

## 2016-12-29 NOTE — ED Notes (Addendum)
Pt stating that she woke up and was really nauseated and that her bilateral arms were tingling. Pt stating that her BP was elevated at 180s/110s and HR 130s. Pt stating that she just felt "belt." Pt stating that she took her Metoprolol. Pt stating leg pain and lower back pain also. Dr. Mable Paris at pt's bedside.

## 2016-12-29 NOTE — ED Triage Notes (Signed)
Pt pulled up front of ED in her car and came in asking for a wheelchair; says she woke less than an hour ago with tingling in both arms, nausea and bilateral leg pain; blood pressure at home 187/110; HR 130 prior to arrival; took a Metoprolol 100mg  since waking; pt seen here approx one month ago with very similar symptoms and home vital signs;

## 2016-12-29 NOTE — ED Notes (Signed)
Pt pulled up front of ED in her car and came in asking for a wheelchair; says she woke less than an hour ago with tingling in both arms, nausea and bilateral leg pain; blood pressure at home 187/110; HR 130 prior to arrival; took a Metoprolol 100mg  since waking

## 2017-03-07 ENCOUNTER — Telehealth: Payer: Self-pay | Admitting: Internal Medicine

## 2017-03-07 MED ORDER — SERTRALINE HCL 100 MG PO TABS: 50.0000 mg | ORAL_TABLET | Freq: Every day | ORAL | 6 refills | Status: DC

## 2017-03-07 NOTE — Telephone Encounter (Signed)
Copied from [place] [id number]. Topic: Quick Communication - Rx Refill/Question >> Mar 07, 2017  8:20 AM Marin Olp L wrote: Medication: sertraline (ZOLOFT) 100 MG tablet   Has the patient contacted their pharmacy? Yes.  (switched pharmacy but old 1 said 0 refills)   (Agent: If no, request that the patient contact the pharmacy for the refill.)   Preferred Pharmacy (with phone number or street name): [facility] [place], [place]: Please be advised that RX refills may take up to 3 business days. We ask that you follow-up with your pharmacy.  Almost out of medication.

## 2017-03-07 NOTE — Telephone Encounter (Signed)
Called [facility] pharmacy to verify if refills were available per prescription sent on 10/2016.  [facility] states that they do not [facility] on file and the only prescription they have is from June 2018. Will send prescription to pt's requested pharmacy.

## 2017-07-03 ENCOUNTER — Other Ambulatory Visit: Payer: Self-pay | Admitting: Internal Medicine

## 2017-07-30 ENCOUNTER — Other Ambulatory Visit: Payer: Self-pay | Admitting: Internal Medicine

## 2017-07-30 NOTE — Telephone Encounter (Signed)
Copied from [place] [phone]. Topic: Quick Communication - See Telephone Encounter >> Jul 30, 2017 10:30 AM Mylinda Latina, NT wrote: CRM for notification. See Telephone encounter for: 07/30/17. Patient called and states that she would like a 30 supply  of her sertraline (ZOLOFT) 100 MG tablet. She states it is much cheaper for her . Please send in a new rx  [facility] [address] [place] - [place] [phone] (Phone) [phone] (Fax)

## 2017-07-31 MED ORDER — SERTRALINE HCL 100 MG PO TABS: 50.0000 mg | ORAL_TABLET | Freq: Every day | ORAL | 0 refills | Status: DC

## 2017-07-31 NOTE — Telephone Encounter (Signed)
Tried to call pt since takes 1/2 pill daily. # 15 is 30 day supply. No answer and no v/m.

## 2017-07-31 NOTE — Telephone Encounter (Signed)
I spoke with pt and pt was asking for 90 day rx. Refill # 45  And then pt would need to schedule f/u appt. Pt voiced understanding. I called [facility] [address] to cancel any # 15 refills still available and spoke with [place]. Jasmine will cancel # 15 x 3.

## 2017-09-06 ENCOUNTER — Emergency Department: Payer: Self-pay

## 2017-09-06 ENCOUNTER — Emergency Department
Admission: EM | Admit: 2017-09-06 | Discharge: 2017-09-07 | Disposition: A | Discharge status: Home / Self Care | Payer: Self-pay | Attending: Emergency Medicine | Admitting: Emergency Medicine

## 2017-09-06 ENCOUNTER — Other Ambulatory Visit: Payer: Self-pay

## 2017-09-06 ENCOUNTER — Encounter: Payer: Self-pay | Admitting: Emergency Medicine

## 2017-09-06 DIAGNOSIS — R197 Diarrhea, unspecified: Secondary | ICD-10-CM | POA: Insufficient documentation

## 2017-09-06 DIAGNOSIS — I10 Essential (primary) hypertension: Secondary | ICD-10-CM | POA: Insufficient documentation

## 2017-09-06 DIAGNOSIS — R002 Palpitations: Secondary | ICD-10-CM | POA: Insufficient documentation

## 2017-09-06 DIAGNOSIS — Z87891 Personal history of nicotine dependence: Secondary | ICD-10-CM | POA: Insufficient documentation

## 2017-09-06 DIAGNOSIS — K529 Noninfective gastroenteritis and colitis, unspecified: Secondary | ICD-10-CM

## 2017-09-06 DIAGNOSIS — Z79899 Other long term (current) drug therapy: Secondary | ICD-10-CM | POA: Insufficient documentation

## 2017-09-06 DIAGNOSIS — J45909 Unspecified asthma, uncomplicated: Secondary | ICD-10-CM | POA: Insufficient documentation

## 2017-09-06 LAB — CBC
HEMATOCRIT: 38.4 % (ref 35.0–47.0)
HEMOGLOBIN: 13.5 g/dL (ref 12.0–16.0)
MCH: 30.6 pg (ref 26.0–34.0)
MCHC: 35.1 g/dL (ref 32.0–36.0)
MCV: 87.2 fL (ref 80.0–100.0)
Platelets: 319 10*3/uL (ref 150–440)
RBC: 4.4 MIL/uL (ref 3.80–5.20)
RDW: 13.2 % (ref 11.5–14.5)
WBC: 12 10*3/uL — ABNORMAL HIGH (ref 3.6–11.0)

## 2017-09-06 LAB — BASIC METABOLIC PANEL
ANION GAP: 8 (ref 5–15)
BUN: 11 mg/dL (ref 6–20)
CO2: 27 mmol/L (ref 22–32)
Calcium: 9.6 mg/dL (ref 8.9–10.3)
Chloride: 107 mmol/L (ref 98–111)
Creatinine, Ser: 0.79 mg/dL (ref 0.44–1.00)
GFR calc Af Amer: >60 mL/min (ref >60)
GFR calc non Af Amer: >60 mL/min (ref >60)
GLUCOSE: 127 mg/dL — AB (ref 70–99)
POTASSIUM: 3.6 mmol/L (ref 3.5–5.1)
Sodium: 142 mmol/L (ref 135–145)

## 2017-09-06 LAB — MAGNESIUM: Magnesium: 2 mg/dL (ref 1.7–2.4)

## 2017-09-06 LAB — POCT PREGNANCY, URINE: PREG TEST UR: NEGATIVE

## 2017-09-06 LAB — TROPONIN I

## 2017-09-06 NOTE — ED Provider Notes (Signed)
[facility] Emergency Department Provider Note  ____________________________________________   First MD Initiated Contact with Patient 09/06/17 2259     (approximate)  I have reviewed the triage vital signs and the nursing notes.   HISTORY  Chief Complaint Chest Pain    HPI Anita Massey is a 37 y.o. female with medical history as listed below who presents for evaluation of palpitations.  She states that she has had this issue in the past and in fact has seen Dr. Fletcher Anon years ago and wore a Holter monitor.  No specific diagnosis was ever given.  She takes labetalol and has for an extended period of time both for blood pressure and apparently for episodes of tachycardia or palpitations in the past.  She says that tonight she was watching TV after dinner and felt like her heart was skipping some beats.  She had some vague and mild chest pressure which is resolved.  Nothing in particular made the symptoms better or worse.  There is no pain, no shortness of breath, no lightheadedness or dizziness.  She said that sometimes (4 months) she has a tingling sensation in her hands.  She denies recent fever/chills, nausea, vomiting, and abdominal pain.  She has a history of urinary tract infections but currently has no dysuria or increased urinary frequency.  She says that about a week ago she was [place] the beach and had similar symptoms for which she went to the hospital and had a thorough evaluation in the local ED including lab work, CT scan of the chest, EKG, etc.  She says that they told her that everything was fine except for her potassium being low, but she was not given any prescription for potassium supplements so she is worried her potassium may be low this time as well.  Past Medical History:  Diagnosis Date  . Anemia   . Anxiety state, unspecified   . 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 Clotting disorder ([place])   . GERD  (gastroesophageal reflux disease)    OCC  . 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 Hypertension   . Multiple joint pain 09/04/2015  . Obesity   . Rosacea   . Undiagnosed cardiac murmurs   . Unspecified essential hypertension     Patient Active Problem List   Diagnosis Date Noted  . Asthmatic bronchitis 11/08/2016  . Adrenal adenoma 09/11/2016  . Panic anxiety syndrome 07/23/2016  . Allergic urticaria   . Multiple joint pain 09/04/2015  . Rosacea 08/15/2015  . 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 Palpitations 04/09/2011    Past Surgical History:  Procedure Laterality Date  . APPENDECTOMY  2009   [facility]  . CESAREAN SECTION  2016  . CHOLECYSTECTOMY N/A 03/20/2016   Procedure: LAPAROSCOPIC CHOLECYSTECTOMY;  Surgeon: Jules Husbands, MD;  Location: [facility] ORS;  Service: General;  Laterality: N/A;  . DILATION AND CURETTAGE OF UTERUS      Prior to Admission medications   Medication Sig Start Date End Date Taking? Authorizing Provider  aspirin-acetaminophen-caffeine (EXCEDRIN MIGRAINE) [phone] MG tablet Take 1 tablet by mouth every 6 (six) hours as needed for headache.    [provider]  Calcium Carbonate-Simethicone (ALKA-SELTZER HEARTBURN + GAS PO) Take 1 tablet by mouth as needed.    [provider]  doxycycline (VIBRAMYCIN) 100 MG capsule Take 100 mg by mouth daily as needed (rosacea).     [provider]  ibuprofen (ADVIL,MOTRIN) 200 MG  tablet Take 200-400 mg by mouth every 6 (six) hours as needed for mild pain (depends on pain level if takes 1-2 tablets).    [provider]  metoprolol succinate (TOPROL-XL) 100 MG 24 hr tablet TAKE 1 TABLET BY MOUTH ONCE DAILY WITH OR IMMEDIATELY FOLLLOWING A MEAL 07/04/17   Venia Carbon, MD  ondansetron (ZOFRAN ODT) 4 MG disintegrating tablet Take 1 tablet (4 mg total) by mouth every 8 (eight) hours as needed for nausea or vomiting. 12/29/16   Darel Hong, MD  ondansetron (ZOFRAN-ODT) 4 MG  disintegrating tablet DISSOLVE 1 TABLET ON THE TONGUE EVERY 8 HOURS AS NEEDED FOR NAUSEA OR VOMITING 02/17/16   [provider]  Pediatric Multivit-Minerals-C (RA GUMMY VITAMINS & MINERALS) CHEW Chew 2 tablets by mouth daily. Women's gummies    [provider]  sertraline (ZOLOFT) 100 MG tablet Take 0.5 tablets (50 mg total) by mouth daily. 07/31/17   Venia Carbon, MD    Allergies Biaxin [clarithromycin]; Toradol [ketorolac tromethamine]; Prednisone; Morphine and related; and Tape  Family History  Problem Relation Age of Onset  . Mitral valve prolapse Mother   . Arrhythmia Mother   . Hypertension Mother   . Diabetes Mother   . Hypertension Father   . Hyperlipidemia Father   . Heart disease Maternal Grandfather   . Lung cancer Maternal Grandfather   . Cancer Maternal Grandfather   . Diabetes Brother   . Heart disease Maternal Aunt   . Diabetes Maternal Aunt   . Depression Maternal Aunt   . Cancer Maternal Grandmother        ovarian melanoma    Social History Social History   Tobacco Use  . Smoking status: Former Smoker    Packs/day: 0.25    Years: 1.00    Pack years: 0.25    Types: Cigarettes    Last attempt to quit: 03/13/1998    Years since quitting: 19.5  . Smokeless tobacco: Never Used  Substance Use Topics  . Alcohol use: No  . Drug use: No    Review of Systems Constitutional: No fever/chills Eyes: No visual changes. ENT: No sore throat. Cardiovascular: Palpitations with some central chest pressure. Respiratory: Denies shortness of breath. Gastrointestinal: No abdominal pain.  No nausea, no vomiting.  No diarrhea.  No constipation. Genitourinary: Negative for dysuria. Musculoskeletal: Negative for neck pain.  Negative for back pain. Integumentary: Negative for rash. Neurological: Negative for headaches, focal weakness or numbness.   ____________________________________________   PHYSICAL EXAM:  VITAL SIGNS: ED Triage Vitals  Enc  Vitals Group     BP 09/06/17 2215 (!) 159/84     Pulse Rate 09/06/17 2215 74     Resp 09/06/17 2215 18     Temp 09/06/17 2215 98.2 F (36.8 C)     Temp Source 09/06/17 2215 Oral     SpO2 09/06/17 2215 98 %     Weight 09/06/17 2217 84.4 kg (186 lb)     Height 09/06/17 2217 1.575 m (5\' 2" )     Head Circumference --      Peak Flow --      Pain Score 09/06/17 2217 7     Pain Loc --      Pain Edu? --      Excl. in [place]? --     Constitutional: Alert and oriented. Well appearing and in no acute distress. Eyes: Conjunctivae are normal.  Head: Atraumatic. Nose: No congestion/rhinnorhea. Mouth/Throat: Mucous membranes are moist. Neck: No stridor.  No  meningeal signs.   Cardiovascular: Normal rate, regular rhythm. Good peripheral circulation. Grossly normal heart sounds. Respiratory: Normal respiratory effort.  No retractions. Lungs CTAB. Gastrointestinal: Soft and nontender. No distention.  Musculoskeletal: No lower extremity tenderness nor edema. No gross deformities of extremities. Neurologic:  Normal speech and language. No gross focal neurologic deficits are appreciated.  Skin:  Skin is warm, dry and intact. No rash noted. Psychiatric: Mood and affect are somewhat anxious but generally appropriate.  ____________________________________________   LABS (all labs ordered are listed, but only abnormal results are displayed)  Labs Reviewed  BASIC METABOLIC PANEL - Abnormal; Notable for the following components:      Result Value   Glucose, Bld 127 (*)    All other components within normal limits  CBC - Abnormal; Notable for the following components:   WBC 12.0 (*)    All other components within normal limits  TROPONIN I  MAGNESIUM  POC URINE PREG, ED  POCT PREGNANCY, URINE   ____________________________________________  EKG  ED ECG REPORT I, Hinda Kehr, the attending physician, personally viewed and interpreted this ECG.  Date: 09/06/2017 EKG Time: 22: 13 Rate:  65 Rhythm: normal sinus rhythm QRS Axis: normal Intervals: normal ST/T Wave abnormalities: normal Narrative Interpretation: no evidence of acute ischemia  ____________________________________________  RADIOLOGY I, Hinda Kehr, personally viewed and evaluated these images (plain radiographs) as part of my medical decision making, as well as reviewing the written report by the radiologist.  ED MD interpretation: No indication of any acute intrathoracic abnormalities  Official radiology report(s): Dg Chest 2 View  Result Date: 09/06/2017 CLINICAL DATA:  Chest pain EXAM: CHEST - 2 VIEW COMPARISON:  12/01/2016 FINDINGS: The heart size and mediastinal contours are within normal limits. Scarring and bronchitic changes. No pneumothorax. IMPRESSION: No active cardiopulmonary disease. Electronically Signed   By: Donavan Foil M.D.   On: 09/06/2017 22:46    ____________________________________________   PROCEDURES  Critical Care performed: No   Procedure(s) performed:   Procedures   ____________________________________________   INITIAL IMPRESSION / ASSESSMENT AND PLAN / ED COURSE  As part of my medical decision making, I reviewed the following data within the [facility] notes reviewed and incorporated, Labs reviewed , EKG interpreted , Old chart reviewed and Notes from prior ED visits    Differential diagnosis includes, but is not limited to, electrolyte abnormalities, musculoskeletal discomfort, GI associated chest discomfort (GERD, esophageal spasm, etc.), ACS, PE, pneumonia.  The patient is very well-appearing and in no distress with no symptoms at this time.  No abnormalities on EKG nor chest x-ray.  I reviewed the medical record and verified that she has had visits in the past over the last couple of years to local facilities with fluttering heart rate as the chief complaint.  Fortunately tonight she has no potassium abnormality.  I have added on a  magnesium level but her basic metabolic panel is within normal limits.  She has a very mild leukocytosis of 12 but I also saw on past clinic visits in the medical record she has had a leukocytosis of unknown origin.  Her troponin is negative which is reassuring.  She is low risk for ACS based on HEART score.  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  Her signs and  symptoms are not consistent with pulmonary embolism and setting aside the possibility of a clotting disorder, she is PERC negative.  I provided reassurance.  There is no indication for repeat troponin at this time.  She also has concerns about long-term (years) diarrhea and she says she has a difficult time finding "a good doctor".  I gave her follow-up information for a GI specialist, cardiologist, and the patient navigator number so that she can try to be established with a primary care doctor.    I gave my usual and customary return precautions.  She understands and agrees with the plan. Clinical Course as of Sep 07 17  Sun Sep 07, 2017  0018 Magnesium WNL.  Proceeding with discharge as planned.   [CF]    Clinical Course User Index [CF] Hinda Kehr, MD    ____________________________________________  FINAL CLINICAL IMPRESSION(S) / ED DIAGNOSES  Final diagnoses:  Palpitations  Chronic diarrhea     MEDICATIONS GIVEN DURING THIS VISIT:  Medications - No data to display   ED Discharge Orders    None       Note:  This document was prepared using Dragon voice recognition software and may include unintentional dictation errors.    Hinda Kehr, MD 09/07/17 [phone]

## 2017-09-06 NOTE — ED Triage Notes (Signed)
Patient states that about 18:00 tonight she was watching tv and started feeling like her heart was skipping beats. Patient states that she has central chest pain also. Patient states that she had similar symptoms about a week ago and was seen in and ER and told that her potassium was low but did not give her anything for her potassium.

## 2017-09-06 NOTE — Discharge Instructions (Signed)
Your workup in the Emergency Department today was reassuring.  We did not find any specific abnormalities.  We recommend you drink plenty of fluids, take your regular medications and/or any new ones prescribed today, and follow up with the doctor(s) listed in these documents as recommended.  I provided you with contact information for various doctors with whom you can follow up in clinic.  Please see the contact information included in this paperwork.  Return to the Emergency Department if you develop new or worsening symptoms that concern you.

## 2017-09-08 ENCOUNTER — Telehealth: Payer: Self-pay

## 2017-09-08 NOTE — Telephone Encounter (Signed)
Lmov for patient to call back  Patient needs fu from ED seen for CP on 09/06/17.  Will try again at a later time

## 2017-09-10 NOTE — Telephone Encounter (Signed)
Patient wants to wait and schedule until she gets Financial Aid  She has no insurance    Nothing else needed.

## 2017-09-11 ENCOUNTER — Telehealth: Payer: Self-pay

## 2017-09-11 NOTE — Telephone Encounter (Signed)
I tried to call the pt to see how she was doing after her recent ER visit for Chest Pain. She is schedule to see Dr Silvio Pate on 09-19-17, but it sounds like she may be looking for a new doctor.

## 2017-09-11 NOTE — Telephone Encounter (Signed)
Pt calling back about the message she states that she feel ok and that she didn't want to change providers please call her back at [phone]

## 2017-09-19 ENCOUNTER — Encounter: Payer: Self-pay | Admitting: Internal Medicine

## 2017-09-19 ENCOUNTER — Ambulatory Visit: Payer: Self-pay | Admitting: Internal Medicine

## 2017-09-19 VITALS — BP 118/80 | HR 68 | Temp 98.0°F | Ht 62.0 in | Wt 192.0 lb

## 2017-09-19 DIAGNOSIS — R002 Palpitations: Secondary | ICD-10-CM

## 2017-09-19 DIAGNOSIS — K580 Irritable bowel syndrome with diarrhea: Secondary | ICD-10-CM

## 2017-09-19 DIAGNOSIS — K589 Irritable bowel syndrome without diarrhea: Secondary | ICD-10-CM | POA: Insufficient documentation

## 2017-09-19 MED ORDER — SERTRALINE HCL 100 MG PO TABS: 50.0000 mg | ORAL_TABLET | Freq: Every day | ORAL | 3 refills | Status: DC

## 2017-09-19 MED ORDER — METOPROLOL SUCCINATE ER 100 MG PO TB24: ORAL_TABLET | ORAL | 3 refills | Status: DC

## 2017-09-19 MED ORDER — ONDANSETRON 4 MG PO TBDP: 4.0000 mg | ORAL_TABLET | Freq: Three times a day (TID) | ORAL | 0 refills | Status: DC | PRN

## 2017-09-19 MED ORDER — HYOSCYAMINE SULFATE 0.125 MG PO TABS: 0.1250 mg | ORAL_TABLET | Freq: Three times a day (TID) | ORAL | 0 refills | Status: DC | PRN

## 2017-09-19 NOTE — Patient Instructions (Signed)
DASH Eating Plan DASH stands for "Dietary Approaches to Stop Hypertension." The DASH eating plan is a healthy eating plan that has been shown to reduce high blood pressure (hypertension). It may also reduce your risk for type 2 diabetes, heart disease, and stroke. The DASH eating plan may also help with weight loss. What are tips for following this plan? General guidelines  Avoid eating more than 2,300 mg (milligrams) of salt (sodium) a day. If you have hypertension, you may need to reduce your sodium intake to 1,500 mg a day.  Limit alcohol intake to no more than 1 drink a day for nonpregnant women and 2 drinks a day for men. One drink equals 12 oz of beer, 5 oz of wine, or 1 oz of hard liquor.  Work with your health care provider to maintain a healthy body weight or to lose weight. Ask what an ideal weight is for you.  Get at least 30 minutes of exercise that causes your heart to beat faster (aerobic exercise) most days of the week. Activities may include walking, swimming, or biking.  Work with your health care provider or diet and nutrition specialist (dietitian) to adjust your eating plan to your individual calorie needs. Reading food labels  Check food labels for the amount of sodium per serving. Choose foods with less than 5 percent of the Daily Value of sodium. Generally, foods with less than 300 mg of sodium per serving fit into this eating plan.  To find whole grains, look for the word "whole" as the first word in the ingredient list. Shopping  Buy products labeled as "low-sodium" or "no salt added."  Buy fresh foods. Avoid canned foods and premade or frozen meals. Cooking  Avoid adding salt when cooking. Use salt-free seasonings or herbs instead of table salt or sea salt. Check with your health care provider or pharmacist before using salt substitutes.  Do not fry foods. Cook foods using healthy methods such as baking, boiling, grilling, and broiling instead.  Cook with  heart-healthy oils, such as olive, canola, soybean, or sunflower oil. Meal planning   Eat a balanced diet that includes: ? 5 or more servings of fruits and vegetables each day. At each meal, try to fill half of your plate with fruits and vegetables. ? Up to 6-8 servings of whole grains each day. ? Less than 6 oz of lean meat, poultry, or fish each day. A 3-oz serving of meat is about the same size as a deck of cards. One egg equals 1 oz. ? 2 servings of low-fat dairy each day. ? A serving of nuts, seeds, or beans 5 times each week. ? Heart-healthy fats. Healthy fats called Omega-3 fatty acids are found in foods such as flaxseeds and coldwater fish, like sardines, salmon, and mackerel.  Limit how much you eat of the following: ? Canned or prepackaged foods. ? Food that is high in trans fat, such as fried foods. ? Food that is high in saturated fat, such as fatty meat. ? Sweets, desserts, sugary drinks, and other foods with added sugar. ? Full-fat dairy products.  Do not salt foods before eating.  Try to eat at least 2 vegetarian meals each week.  Eat more home-cooked food and less restaurant, buffet, and fast food.  When eating at a restaurant, ask that your food be prepared with less salt or no salt, if possible. What foods are recommended? The items listed may not be a complete list. Talk with your dietitian about what   dietary choices are best for you. Grains Whole-grain or whole-wheat bread. Whole-grain or whole-wheat pasta. Brown rice. Oatmeal. Quinoa. Bulgur. Whole-grain and low-sodium cereals. Pita bread. Low-fat, low-sodium crackers. Whole-wheat flour tortillas. Vegetables Fresh or frozen vegetables (raw, steamed, roasted, or grilled). Low-sodium or reduced-sodium tomato and vegetable juice. Low-sodium or reduced-sodium tomato sauce and tomato paste. Low-sodium or reduced-sodium canned vegetables. Fruits All fresh, dried, or frozen fruit. Canned fruit in natural juice (without  added sugar). Meat and other protein foods Skinless chicken or turkey. Ground chicken or turkey. Pork with fat trimmed off. Fish and seafood. Egg whites. Dried beans, peas, or lentils. Unsalted nuts, nut butters, and seeds. Unsalted canned beans. Lean cuts of beef with fat trimmed off. Low-sodium, lean deli meat. Dairy Low-fat (1%) or fat-free (skim) milk. Fat-free, low-fat, or reduced-fat cheeses. Nonfat, low-sodium ricotta or cottage cheese. Low-fat or nonfat yogurt. Low-fat, low-sodium cheese. Fats and oils Soft margarine without trans fats. Vegetable oil. Low-fat, reduced-fat, or light mayonnaise and salad dressings (reduced-sodium). Canola, safflower, olive, soybean, and sunflower oils. Avocado. Seasoning and other foods Herbs. Spices. Seasoning mixes without salt. Unsalted popcorn and pretzels. Fat-free sweets. What foods are not recommended? The items listed may not be a complete list. Talk with your dietitian about what dietary choices are best for you. Grains Baked goods made with fat, such as croissants, muffins, or some breads. Dry pasta or rice meal packs. Vegetables Creamed or fried vegetables. Vegetables in a cheese sauce. Regular canned vegetables (not low-sodium or reduced-sodium). Regular canned tomato sauce and paste (not low-sodium or reduced-sodium). Regular tomato and vegetable juice (not low-sodium or reduced-sodium). Pickles. Olives. Fruits Canned fruit in a light or heavy syrup. Fried fruit. Fruit in cream or butter sauce. Meat and other protein foods Fatty cuts of meat. Ribs. Fried meat. Bacon. Sausage. Bologna and other processed lunch meats. Salami. Fatback. Hotdogs. Bratwurst. Salted nuts and seeds. Canned beans with added salt. Canned or smoked fish. Whole eggs or egg yolks. Chicken or turkey with skin. Dairy Whole or 2% milk, cream, and half-and-half. Whole or full-fat cream cheese. Whole-fat or sweetened yogurt. Full-fat cheese. Nondairy creamers. Whipped toppings.  Processed cheese and cheese spreads. Fats and oils Butter. Stick margarine. Lard. Shortening. Ghee. Bacon fat. Tropical oils, such as coconut, palm kernel, or palm oil. Seasoning and other foods Salted popcorn and pretzels. Onion salt, garlic salt, seasoned salt, table salt, and sea salt. Worcestershire sauce. Tartar sauce. Barbecue sauce. Teriyaki sauce. Soy sauce, including reduced-sodium. Steak sauce. Canned and packaged gravies. Fish sauce. Oyster sauce. Cocktail sauce. Horseradish that you find on the shelf. Ketchup. Mustard. Meat flavorings and tenderizers. Bouillon cubes. Hot sauce and Tabasco sauce. Premade or packaged marinades. Premade or packaged taco seasonings. Relishes. Regular salad dressings. Where to find more information:  National Heart, Lung, and Blood Institute: www.nhlbi.nih.gov  American Heart Association: www.heart.org Summary  The DASH eating plan is a healthy eating plan that has been shown to reduce high blood pressure (hypertension). It may also reduce your risk for type 2 diabetes, heart disease, and stroke.  With the DASH eating plan, you should limit salt (sodium) intake to 2,300 mg a day. If you have hypertension, you may need to reduce your sodium intake to 1,500 mg a day.  When on the DASH eating plan, aim to eat more fresh fruits and vegetables, whole grains, lean proteins, low-fat dairy, and heart-healthy fats.  Work with your health care provider or diet and nutrition specialist (dietitian) to adjust your eating plan to your individual   calorie needs. This information is not intended to replace advice given to you by your health care provider. Make sure you discuss any questions you have with your health care provider. Document Released: 12/20/2010 Document Revised: 12/25/2015 Document Reviewed: 12/25/2015 Elsevier Interactive Patient Education  2018 [employer] Inc.  

## 2017-09-19 NOTE — Addendum Note (Signed)
Addended by: Pilar Grammes on: 09/19/2017 09:02 AM   Modules accepted: Orders

## 2017-09-19 NOTE — Assessment & Plan Note (Addendum)
Does get flushing at times Will need to order the urine testing again when she has insurance Will try hyoscyamine
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
## 2017-09-19 NOTE — Assessment & Plan Note (Signed)
Recurrent spells Doubt SVT May need to consider other testing---especially with known adrenal adenoma (never did the ordered urine collection)

## 2017-12-18 ENCOUNTER — Emergency Department: Payer: BLUE CROSS/BLUE SHIELD

## 2017-12-18 ENCOUNTER — Observation Stay
Admission: EM | Admit: 2017-12-18 | Discharge: 2017-12-19 | Disposition: A | Discharge status: Home / Self Care | Payer: BLUE CROSS/BLUE SHIELD | Attending: Internal Medicine | Admitting: Internal Medicine

## 2017-12-18 ENCOUNTER — Other Ambulatory Visit: Payer: Self-pay

## 2017-12-18 ENCOUNTER — Encounter: Payer: Self-pay | Admitting: *Deleted

## 2017-12-18 DIAGNOSIS — F329 Major depressive disorder, single episode, unspecified: Secondary | ICD-10-CM | POA: Insufficient documentation

## 2017-12-18 DIAGNOSIS — Z791 Long term (current) use of non-steroidal anti-inflammatories (NSAID): Secondary | ICD-10-CM | POA: Diagnosis not present

## 2017-12-18 DIAGNOSIS — D649 Anemia, unspecified: Secondary | ICD-10-CM | POA: Diagnosis not present

## 2017-12-18 DIAGNOSIS — I340 Nonrheumatic mitral (valve) insufficiency: Secondary | ICD-10-CM | POA: Diagnosis not present

## 2017-12-18 DIAGNOSIS — R079 Chest pain, unspecified: Secondary | ICD-10-CM

## 2017-12-18 DIAGNOSIS — K219 Gastro-esophageal reflux disease without esophagitis: Secondary | ICD-10-CM | POA: Insufficient documentation

## 2017-12-18 DIAGNOSIS — I10 Essential (primary) hypertension: Secondary | ICD-10-CM | POA: Diagnosis present

## 2017-12-18 DIAGNOSIS — R9431 Abnormal electrocardiogram [ECG] [EKG]: Secondary | ICD-10-CM | POA: Diagnosis not present

## 2017-12-18 DIAGNOSIS — Z8249 Family history of ischemic heart disease and other diseases of the circulatory system: Secondary | ICD-10-CM | POA: Insufficient documentation

## 2017-12-18 DIAGNOSIS — L719 Rosacea, unspecified: Secondary | ICD-10-CM | POA: Insufficient documentation

## 2017-12-18 DIAGNOSIS — E669 Obesity, unspecified: Secondary | ICD-10-CM | POA: Diagnosis not present

## 2017-12-18 DIAGNOSIS — R002 Palpitations: Secondary | ICD-10-CM | POA: Diagnosis not present

## 2017-12-18 DIAGNOSIS — Z79899 Other long term (current) drug therapy: Secondary | ICD-10-CM | POA: Insufficient documentation

## 2017-12-18 DIAGNOSIS — Z87891 Personal history of nicotine dependence: Secondary | ICD-10-CM | POA: Diagnosis not present

## 2017-12-18 DIAGNOSIS — F419 Anxiety disorder, unspecified: Secondary | ICD-10-CM | POA: Diagnosis not present

## 2017-12-18 DIAGNOSIS — I160 Hypertensive urgency: Secondary | ICD-10-CM | POA: Diagnosis not present

## 2017-12-18 LAB — BASIC METABOLIC PANEL
ANION GAP: 10 (ref 5–15)
BUN: 12 mg/dL (ref 6–20)
CALCIUM: 9.7 mg/dL (ref 8.9–10.3)
CO2: 23 mmol/L (ref 22–32)
CREATININE: 0.61 mg/dL (ref 0.44–1.00)
Chloride: 105 mmol/L (ref 98–111)
GFR calc non Af Amer: >60 mL/min (ref >60)
Glucose, Bld: 214 mg/dL — ABNORMAL HIGH (ref 70–99)
Potassium: 3.5 mmol/L (ref 3.5–5.1)
SODIUM: 138 mmol/L (ref 135–145)

## 2017-12-18 LAB — FIBRIN DERIVATIVES D-DIMER (ARMC ONLY): Fibrin derivatives D-dimer (ARMC): 173.54 ng/mL (FEU) (ref 0.00–499.00)

## 2017-12-18 LAB — TROPONIN I: Troponin I: <0.03 ng/mL (ref <0.03)

## 2017-12-18 LAB — CBC
HCT: 42.1 % (ref 36.0–46.0)
Hemoglobin: 14.2 g/dL (ref 12.0–15.0)
MCH: 29.5 pg (ref 26.0–34.0)
MCHC: 33.7 g/dL (ref 30.0–36.0)
MCV: 87.3 fL (ref 80.0–100.0)
NRBC: 0 % (ref 0.0–0.2)
PLATELETS: 319 10*3/uL (ref 150–400)
RBC: 4.82 MIL/uL (ref 3.87–5.11)
RDW: 12.6 % (ref 11.5–15.5)
WBC: 11.5 10*3/uL — AB (ref 4.0–10.5)

## 2017-12-18 LAB — POCT PREGNANCY, URINE: PREG TEST UR: NEGATIVE

## 2017-12-18 LAB — TSH: TSH: 2.223 u[IU]/mL (ref 0.350–4.500)

## 2017-12-18 LAB — MAGNESIUM: Magnesium: 1.7 mg/dL (ref 1.7–2.4)

## 2017-12-18 MED ORDER — METOPROLOL SUCCINATE ER 100 MG PO TB24
100.0000 mg | ORAL_TABLET | Freq: Every day | ORAL | Status: DC
  Administered 2017-12-18 – 2017-12-19 (×2): 100 mg via ORAL
  Filled 2017-12-18 (×2): qty 1

## 2017-12-18 MED ORDER — ENOXAPARIN SODIUM 40 MG/0.4ML ~~LOC~~ SOLN
40.0000 mg | SUBCUTANEOUS | Status: DC
  Administered 2017-12-18: 40 mg via SUBCUTANEOUS
  Filled 2017-12-18: qty 0.4

## 2017-12-18 MED ORDER — IBUPROFEN 400 MG PO TABS
200.0000 mg | ORAL_TABLET | Freq: Four times a day (QID) | ORAL | Status: DC | PRN
  Filled 2017-12-18: qty 1

## 2017-12-18 MED ORDER — NITROGLYCERIN 2 % TD OINT
1.0000 [in_us] | TOPICAL_OINTMENT | Freq: Once | TRANSDERMAL | Status: AC
  Administered 2017-12-18: 1 [in_us] via TOPICAL
  Filled 2017-12-18: qty 1

## 2017-12-18 MED ORDER — SODIUM CHLORIDE 0.9 % IV BOLUS
1000.0000 mL | Freq: Once | INTRAVENOUS | Status: AC
  Administered 2017-12-18: 1000 mL via INTRAVENOUS

## 2017-12-18 MED ORDER — ASPIRIN 81 MG PO CHEW
324.0000 mg | CHEWABLE_TABLET | Freq: Once | ORAL | Status: AC
  Administered 2017-12-18: 324 mg via ORAL
  Filled 2017-12-18: qty 4

## 2017-12-18 MED ORDER — ONDANSETRON HCL 4 MG/2ML IJ SOLN: 4.0000 mg | Freq: Four times a day (QID) | INTRAMUSCULAR | Status: DC | PRN

## 2017-12-18 MED ORDER — SERTRALINE HCL 50 MG PO TABS
50.0000 mg | ORAL_TABLET | Freq: Every day | ORAL | Status: DC
  Administered 2017-12-18 – 2017-12-19 (×2): 50 mg via ORAL
  Filled 2017-12-18 (×2): qty 1

## 2017-12-18 MED ORDER — ASPIRIN-ACETAMINOPHEN-CAFFEINE 250-250-65 MG PO TABS
1.0000 | ORAL_TABLET | Freq: Four times a day (QID) | ORAL | Status: DC | PRN
  Filled 2017-12-18: qty 1

## 2017-12-18 MED ORDER — ACETAMINOPHEN 325 MG PO TABS
650.0000 mg | ORAL_TABLET | ORAL | Status: DC | PRN
  Administered 2017-12-19: 650 mg via ORAL
  Filled 2017-12-18: qty 2

## 2017-12-18 NOTE — ED Notes (Signed)
Patient transported to X-ray 

## 2017-12-18 NOTE — H&P (Signed)
[place] [facility] [place] NAME: Anita Massey    MR#:  [id number]  DATE OF BIRTH:  23-Sep-1980  DATE OF ADMISSION:  12/18/2017  PRIMARY CARE PHYSICIAN: Venia Carbon, MD   REQUESTING/REFERRING PHYSICIAN: Merlyn Lot, MD  CHIEF COMPLAINT:  palpitations  HISTORY OF PRESENT ILLNESS:  Anita Massey  is a 37 y.o. female with a known history of GERD, essential hypertension, obesity and a past history of palpitations in the past seen by Dr. Fletcher Anon and patient was monitored on 7-day monitor during the previous episode and apparently as reported by the patient no abnormalities on the event monitor previously.  Currently TSH is normal and initial troponin is negative.  Dr. Oval Linsey the on-call cardiologist has reviewed her abnormal EKG with significant ST-T wave changes and recommended recommended echocardiogram, overnight observation and cycle cardiac biomarkers.  Husband at bedside.  Patient denies any symptoms during my examination  PAST MEDICAL HISTORY:   Past Medical History:  Diagnosis Date  . Anemia   . Anxiety state, unspecified   . Blood dyscrasia    PROTHROMBIN G 97989 HETEROZYGOSITY (FACTOR 2)  . Clotting disorder ([place])   . GERD (gastroesophageal reflux disease)    OCC  . 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 Hypertension   . Multiple joint pain 09/04/2015  . Obesity   . Rosacea   . Undiagnosed cardiac murmurs   . Unspecified essential hypertension     PAST SURGICAL HISTOIRY:   Past Surgical History:  Procedure Laterality Date  . APPENDECTOMY  2009   [facility]  . CESAREAN SECTION  2016  . CHOLECYSTECTOMY N/A 03/20/2016   Procedure: LAPAROSCOPIC CHOLECYSTECTOMY;  Surgeon: Jules Husbands, MD;  Location: [facility] ORS;  Service: General;  Laterality: N/A;  . DILATION AND CURETTAGE OF UTERUS      SOCIAL HISTORY:   Social History   Tobacco Use  . Smoking status: Former Smoker    Packs/day: 0.25    Years: 1.00     Pack years: 0.25    Types: Cigarettes    Last attempt to quit: 03/13/1998    Years since quitting: 19.7  . Smokeless tobacco: Never Used  Substance Use Topics  . Alcohol use: No    FAMILY HISTORY:   Family History  Problem Relation Age of Onset  . Mitral valve prolapse Mother   . Arrhythmia Mother   . Hypertension Mother   . Diabetes Mother   . Hypertension Father   . Hyperlipidemia Father   . Heart disease Maternal Grandfather   . Lung cancer Maternal Grandfather   . Cancer Maternal Grandfather   . Diabetes Brother   . Heart disease Maternal Aunt   . Diabetes Maternal Aunt   . Depression Maternal Aunt   . Cancer Maternal Grandmother        ovarian melanoma    DRUG ALLERGIES:   Allergies  Allergen Reactions  . Biaxin [Clarithromycin] Hives  . Toradol [Ketorolac Tromethamine]     Chest pain   . Prednisone Anxiety and Palpitations    "Makes me feel crazy"  . Tape Rash    Paper tape is fine    REVIEW OF SYSTEMS:  CONSTITUTIONAL: No fever, fatigue or weakness.  EYES: No blurred or double vision.  EARS, NOSE, AND THROAT: No tinnitus or ear pain.  RESPIRATORY: No cough, shortness of breath, wheezing or hemoptysis.  CARDIOVASCULAR: No chest pain, orthopnea, edema.  Reporting intermittent episodes of palpitations GASTROINTESTINAL: No nausea,  vomiting, diarrhea or abdominal pain.  GENITOURINARY: No dysuria, hematuria.  ENDOCRINE: No polyuria, nocturia,  HEMATOLOGY: No anemia, easy bruising or bleeding SKIN: No rash or lesion. MUSCULOSKELETAL: No joint pain or arthritis.   NEUROLOGIC: No tingling, numbness, weakness.  PSYCHIATRY: No anxiety or depression.   MEDICATIONS AT HOME:   Prior to Admission medications   Medication Sig Start Date End Date Taking? Authorizing Provider  aspirin-acetaminophen-caffeine (EXCEDRIN MIGRAINE) [phone] MG tablet Take 1 tablet by mouth every 6 (six) hours as needed for headache.   Yes [provider]  Calcium  Carbonate-Simethicone (ALKA-SELTZER HEARTBURN + GAS PO) Take 1 tablet by mouth as needed.   Yes [provider]  doxycycline (VIBRAMYCIN) 100 MG capsule Take 100 mg by mouth daily as needed (rosacea).    Yes [provider]  ibuprofen (ADVIL,MOTRIN) 200 MG tablet Take 200-400 mg by mouth every 6 (six) hours as needed for mild pain (depends on pain level if takes 1-2 tablets).   Yes [provider]  Ivermectin 1 % CREA Apply 1 application topically daily as needed.   Yes [provider]  metoprolol succinate (TOPROL-XL) 100 MG 24 hr tablet TAKE 1 TABLET BY MOUTH ONCE DAILY WITH OR IMMEDIATELY FOLLLOWING A MEAL 09/19/17  Yes Venia Carbon, MD  ondansetron (ZOFRAN ODT) 4 MG disintegrating tablet Take 1 tablet (4 mg total) by mouth every 8 (eight) hours as needed for nausea or vomiting. 09/19/17  Yes Venia Carbon, MD  Pediatric Multivit-Minerals-C (RA GUMMY VITAMINS & MINERALS) CHEW Chew 2 tablets by mouth daily. Women's gummies   Yes [provider]  sertraline (ZOLOFT) 100 MG tablet Take 0.5 tablets (50 mg total) by mouth daily. 09/19/17  Yes Venia Carbon, MD  hyoscyamine (LEVSIN, ANASPAZ) 0.125 MG tablet Take 1 tablet (0.125 mg total) by mouth 3 (three) times daily as needed. To prevent diarrhea Patient not taking: Reported on 12/18/2017 09/19/17   Venia Carbon, MD      VITAL SIGNS:  Blood pressure (!) 178/98, pulse 87, temperature 97.7 F (36.5 C), temperature source Oral, resp. rate 18, height 5\' 2"  (1.575 m), weight 86.2 kg, last menstrual period 12/03/2017, SpO2 100 %.  PHYSICAL EXAMINATION:  GENERAL:  37 y.o.-year-old patient lying in the bed with no acute distress.  EYES: Pupils equal, round, reactive to light and accommodation. No scleral icterus. Extraocular muscles intact.  HEENT: Head atraumatic, normocephalic. Oropharynx and nasopharynx clear.  NECK:  Supple, no jugular venous distention. No thyroid enlargement, no tenderness.   LUNGS: Normal breath sounds bilaterally, no wheezing, rales,rhonchi or crepitation. No use of accessory muscles of respiration.  CARDIOVASCULAR: S1, S2 normal. No murmurs, rubs, or gallops.  ABDOMEN: Soft, nontender, nondistended. Bowel sounds present. No organomegaly or mass.  EXTREMITIES: No pedal edema, cyanosis, or clubbing.  NEUROLOGIC: Cranial nerves II through XII are intact. Muscle strength 5/5 in all extremities. Sensation intact. Gait not checked.  PSYCHIATRIC: The patient is alert and oriented x 3.  SKIN: No obvious rash, lesion, or ulcer.   LABORATORY PANEL:   CBC Recent Labs  Lab 12/18/17 1757  WBC 11.5*  HGB 14.2  HCT 42.1  PLT 319   ------------------------------------------------------------------------------------------------------------------  Chemistries  Recent Labs  Lab 12/18/17 1757 12/18/17 1829  NA 138  --   K 3.5  --   CL 105  --   CO2 23  --   GLUCOSE 214*  --   BUN 12  --   CREATININE 0.61  --   CALCIUM  9.7  --   MG  --  1.7   ------------------------------------------------------------------------------------------------------------------  Cardiac Enzymes Recent Labs  Lab 12/18/17 1757  TROPONINI <0.03   ------------------------------------------------------------------------------------------------------------------  RADIOLOGY:  Dg Chest 2 View  Result Date: 12/18/2017 CLINICAL DATA:  Tachycardia and nausea. EXAM: CHEST - 2 VIEW COMPARISON:  09/06/2017 FINDINGS: The cardiac silhouette, mediastinal and hilar contours are normal in stable. Slight hyperinflation and a few linear areas of scarring change but no infiltrates, edema or effusions. The bony thorax is intact. IMPRESSION: Mild hyperinflation and chronic basilar scarring changes but no acute overlying pulmonary process. Electronically Signed   By: Marijo Sanes M.D.   On: 12/18/2017 18:53    EKG:   Orders placed or performed during the hospital encounter of 12/18/17  . ED EKG  within 10 minutes  . ED EKG within 10 minutes  . EKG 12-Lead  . EKG 12-Lead    IMPRESSION AND PLAN:   Anita Massey  is a 37 y.o. female with a known history of GERD, essential hypertension, obesity and a past history of palpitations in the past seen by Dr. Fletcher Anon and patient was monitored on 7-day monitor during the previous episode and apparently as reported by the patient no abnormalities on the event monitor previously.  Currently TSH is normal and initial troponin is negative.  Dr. Oval Linsey the on-call cardiologist has reviewed her abnormal EKG with significant ST-T wave changes   # palpitations with dyspnea Admit to telemetry, tele monitoring TSH nml Fibrin diabetes are in the normal range Cycle troponins and cardiology consult to Dr. Fletcher Anon as patient was seen by that group before Previously patient was monitored for a week and event monitor and no acute findings as reported by the patient Continue beta-blocker patient's home medication  #Abnormal EKG Significant ST-T wave changes were noticed in the initial EKG which was reviewed by on-call cardiology Dr. Oval Linsey and recommended no heparin drip just recommended to monitor patient on telemetry and cycle cardiac biomarkers We will get echocardiogram  #Hypertension urgency Blood pressure is elevated will resume metoprolol her home medication  IV Lopressor as needed  #GERD PPI  DVT prophylaxis with Lovenox    All the records are reviewed and case discussed with ED provider. Management plans discussed with the patient, family and they are in agreement.  CODE STATUS: fc  TOTAL TIME TAKING CARE OF THIS PATIENT: 43 minutes.   Note: This dictation was prepared with Dragon dictation along with smaller phrase technology. Any transcriptional errors that result from this process are unintentional.  Nicholes Mango M.D on 12/18/2017 at 9:45 PM  Between 7am to 6pm - Pager - [phone]  After 6pm go to [email or web address] - password [email or web address]  [facility]  Office  [phone]  CC: Primary care physician; Venia Carbon, MD

## 2017-12-18 NOTE — ED Triage Notes (Signed)
Pt reports watching tv today, heart was racing.  Had episode of nausea.  No chest pain.  Pt reports feeling lightheaded.  Pt alert  Speech clear.

## 2017-12-18 NOTE — ED Notes (Signed)
Hospitalist to bedside at this time 

## 2017-12-18 NOTE — Progress Notes (Signed)
Family Meeting Note  Advance Directive:yes  Today a meeting took place with the Patient, husband at bedside     The following clinical team members were present during this meeting:MD  The following were discussed:Patient's diagnosis: Palpitations, dyspnea, abnormal EKG, hypertensive urgency, GERD, obesity, treatment plan of care discussed in detail with the patient and her husband at bedside.  They both verbalized understanding of the plan Patient's progosis: > 12 months and Goals for treatment: Full Code  Husband is the healthcare POA  Additional follow-up to be provided: Hospitalist, cardiology  Time spent during discussion:18 min  Nicholes Mango, MD

## 2017-12-18 NOTE — ED Provider Notes (Addendum)
[facility] Emergency Department Provider Note    First MD Initiated Contact with Patient 12/18/17 1807     (approximate)  I have reviewed the triage vital signs and the nursing notes.   HISTORY  Chief Complaint Palpitations    HPI LABERTA WILBON is a 37 y.o. female the below listed past medical history presents the ER for evaluation of palpitations and shortness of breath.  States he was sitting at home watching TV she developed palpitations nausea shortness of breath and chest pain.  States that she got diaphoretic during this episode lasted 10 to 15 minutes.  States she was feeling lightheaded.  Denies any measured temperature fevers at home.  States that slowly easing off since she arrived to the ER.  Does have family history of cardiac disease.  Has not noted any lower extremity swelling.    Past Medical History:  Diagnosis Date  . Anemia   . Anxiety state, unspecified   . 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 Clotting disorder ([place])   . GERD (gastroesophageal reflux disease)    OCC  . 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 Hypertension   . Multiple joint pain 09/04/2015  . Obesity   . Rosacea   . Undiagnosed cardiac murmurs   . Unspecified essential hypertension    Family History  Problem Relation Age of Onset  . Mitral valve prolapse Mother   . Arrhythmia Mother   . Hypertension Mother   . Diabetes Mother   . Hypertension Father   . Hyperlipidemia Father   . Heart disease Maternal Grandfather   . Lung cancer Maternal Grandfather   . Cancer Maternal Grandfather   . Diabetes Brother   . Heart disease Maternal Aunt   . Diabetes Maternal Aunt   . Depression Maternal Aunt   . Cancer Maternal Grandmother        ovarian melanoma   Past Surgical History:  Procedure Laterality Date  . APPENDECTOMY  2009   [facility]  . CESAREAN SECTION  2016  . CHOLECYSTECTOMY N/A 03/20/2016   Procedure: LAPAROSCOPIC  CHOLECYSTECTOMY;  Surgeon: Jules Husbands, MD;  Location: [facility] ORS;  Service: General;  Laterality: N/A;  . DILATION AND CURETTAGE OF UTERUS     Patient Active Problem List   Diagnosis Date Noted  . IBS (irritable bowel syndrome) 09/19/2017  . Asthmatic bronchitis 11/08/2016  . Adrenal adenoma 09/11/2016  . Panic anxiety syndrome 07/23/2016  . Allergic urticaria   . Multiple joint pain 09/04/2015  . Rosacea 08/15/2015  . 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 Palpitations 04/09/2011      Prior to Admission medications   Medication Sig Start Date End Date Taking? Authorizing Provider  aspirin-acetaminophen-caffeine (EXCEDRIN MIGRAINE) [phone] MG tablet Take 1 tablet by mouth every 6 (six) hours as needed for headache.   Yes [provider]  Calcium Carbonate-Simethicone (ALKA-SELTZER HEARTBURN + GAS PO) Take 1 tablet by mouth as needed.   Yes [provider]  doxycycline (VIBRAMYCIN) 100 MG capsule Take 100 mg by mouth daily as needed (rosacea).    Yes [provider]  ibuprofen (ADVIL,MOTRIN) 200 MG tablet Take 200-400 mg by mouth every 6 (six) hours as needed for mild pain (depends on pain level if takes 1-2 tablets).   Yes [provider]  Ivermectin 1 % CREA Apply 1 application topically daily as needed.   Yes [provider]  metoprolol succinate (TOPROL-XL) 100 MG 24  hr tablet TAKE 1 TABLET BY MOUTH ONCE DAILY WITH OR IMMEDIATELY FOLLLOWING A MEAL 09/19/17  Yes Venia Carbon, MD  ondansetron (ZOFRAN ODT) 4 MG disintegrating tablet Take 1 tablet (4 mg total) by mouth every 8 (eight) hours as needed for nausea or vomiting. 09/19/17  Yes Venia Carbon, MD  Pediatric Multivit-Minerals-C (RA GUMMY VITAMINS & MINERALS) CHEW Chew 2 tablets by mouth daily. Women's gummies   Yes [provider]  sertraline (ZOLOFT) 100 MG tablet Take 0.5 tablets (50 mg total) by mouth daily. 09/19/17  Yes Venia Carbon, MD  hyoscyamine (LEVSIN,  ANASPAZ) 0.125 MG tablet Take 1 tablet (0.125 mg total) by mouth 3 (three) times daily as needed. To prevent diarrhea Patient not taking: Reported on 12/18/2017 09/19/17   Venia Carbon, MD    Allergies Biaxin [clarithromycin]; Toradol [ketorolac tromethamine]; Prednisone; and Tape    Social History Social History   Tobacco Use  . Smoking status: Former Smoker    Packs/day: 0.25    Years: 1.00    Pack years: 0.25    Types: Cigarettes    Last attempt to quit: 03/13/1998    Years since quitting: 19.7  . Smokeless tobacco: Never Used  Substance Use Topics  . Alcohol use: No  . Drug use: No    Review of Systems Patient denies headaches, rhinorrhea, blurry vision, numbness, shortness of breath, chest pain, edema, cough, abdominal pain, nausea, vomiting, diarrhea, dysuria, fevers, rashes or hallucinations unless otherwise stated above in HPI. ____________________________________________   PHYSICAL EXAM:  VITAL SIGNS: Vitals:   12/18/17 1758 12/18/17 1830  BP: (!) 170/101 (!) 163/99  Pulse: (!) 127 91  Resp: 20 18  Temp: 98.4 F (36.9 C)   SpO2: 100% 96%    Constitutional: Alert and oriented.  Eyes: Conjunctivae are normal.  Head: Atraumatic. Nose: No congestion/rhinnorhea. Mouth/Throat: Mucous membranes are moist.   Neck: No stridor. Painless ROM.  Cardiovascular: Normal rate, regular rhythm. Grossly normal heart sounds.  Good peripheral circulation. Respiratory: Normal respiratory effort.  No retractions. Lungs CTAB. Gastrointestinal: Soft and nontender. No distention. No abdominal bruits. No CVA tenderness. Genitourinary:  Musculoskeletal: No lower extremity tenderness nor edema.  No joint effusions. Neurologic:  Normal speech and language. No gross focal neurologic deficits are appreciated. No facial droop Skin:  Skin is warm, dry and intact. No rash noted. Psychiatric: Mood and affect are normal. Speech and behavior are  normal.  ____________________________________________   LABS (all labs ordered are listed, but only abnormal results are displayed)  Results for orders placed or performed during the hospital encounter of 12/18/17 (from the past 24 hour(s))  Basic metabolic panel     Status: Abnormal   Collection Time: 12/18/17  5:57 PM  Result Value Ref Range   Sodium 138 135 - 145 mmol/L   Potassium 3.5 3.5 - 5.1 mmol/L   Chloride 105 98 - 111 mmol/L   CO2 23 22 - 32 mmol/L   Glucose, Bld 214 (H) 70 - 99 mg/dL   BUN 12 6 - 20 mg/dL   Creatinine, Ser 0.61 0.44 - 1.00 mg/dL   Calcium 9.7 8.9 - 10.3 mg/dL   GFR calc non Af Amer >60 >60 mL/min   GFR calc Af Amer >60 >60 mL/min   Anion gap 10 5 - 15  CBC     Status: Abnormal   Collection Time: 12/18/17  5:57 PM  Result Value Ref Range   WBC 11.5 (H) 4.0 - 10.5 K/uL  RBC 4.82 3.87 - 5.11 MIL/uL   Hemoglobin 14.2 12.0 - 15.0 g/dL   HCT 42.1 36.0 - 46.0 %   MCV 87.3 80.0 - 100.0 fL   MCH 29.5 26.0 - 34.0 pg   MCHC 33.7 30.0 - 36.0 g/dL   RDW 12.6 11.5 - 15.5 %   Platelets 319 150 - 400 K/uL   nRBC 0.0 0.0 - 0.2 %  Troponin I - ONCE - STAT     Status: None   Collection Time: 12/18/17  5:57 PM  Result Value Ref Range   Troponin I <0.03 <0.03 ng/mL  Pregnancy, urine POC     Status: None   Collection Time: 12/18/17  6:10 PM  Result Value Ref Range   Preg Test, Ur NEGATIVE NEGATIVE  Magnesium     Status: None   Collection Time: 12/18/17  6:29 PM  Result Value Ref Range   Magnesium 1.7 1.7 - 2.4 mg/dL  TSH     Status: None   Collection Time: 12/18/17  6:33 PM  Result Value Ref Range   TSH 2.223 0.350 - 4.500 uIU/mL  Fibrin derivatives D-Dimer ([facility] only)     Status: None   Collection Time: 12/18/17  6:33 PM  Result Value Ref Range   Fibrin derivatives D-dimer ([facility]) 173.54 0.00 - 499.00 ng/mL (FEU)   ____________________________________________  EKG My review and personal interpretation at Time: 17:53   Indication: palpitations  Rate:  120  Rhythm: sinus Axis: normal Other: inferolateral st depression, discordant ST elevation in aVR and V1   My review and personal interpretation at Time: 18:20   Indication: chest pressure  Rate: 105  Rhythm: sinus Axis: normal Other: inferolateral st and t wave abnorality, less prominent st change in aVR and V1 now less prominent  ____________________________________________  RADIOLOGY  I personally reviewed all radiographic images ordered to evaluate for the above acute complaints and reviewed radiology reports and findings.  These findings were personally discussed with the patient.  Please see medical record for radiology report.  ____________________________________________   PROCEDURES  Procedure(s) performed:  Procedures    Critical Care performed: no ____________________________________________   INITIAL IMPRESSION / ASSESSMENT AND PLAN / ED COURSE  Pertinent labs & imaging results that were available during my care of the patient were reviewed by me and considered in my medical decision making (see chart for details).   DDX: ACS, pericarditis, esophagitis, boerhaaves, pe, dissection, pna, bronchitis, costochondritis   RAISHA BRABENDER is a 37 y.o. who presents to the ED with symptoms as described above.  Patient nontoxic-appearing, mildly tachycardic and hypertensive.  Not diaphoretic at this time.  Denies any chest pain.  EKG is abnormal and different from previous.  No STEMI criteria.  Repeat EKG ordered does not show any worsening ST deviation, possibly rate dependent changes.  The patient will be placed on continuous pulse oximetry and telemetry for monitoring.  Laboratory evaluation will be sent to evaluate for the above complaints.     Clinical Course as of Dec 19 2018  Thu Dec 18, 2017  1610 Initial troponin is negative.  However given the patient's extent of ST deviation from previous I do believe patient would benefit from hospitalization.  Possibly  secondary to demand ischemia secondary to tachydysrhythmia but patient does have significant risk factors and family history of coronary disease.  D-dimer is negative.  Does not seem clinically consistent with dissection or PE.  Patient will be given aspirin.  Currently pain-free.   [PR]  1950 Consulted with Dr. and of cardiology.  Agrees with management plan admission to the hospital for telemetry and serial enzymes.  If troponin elevates if she develops worsening pain will heparinize.  She remains pain-free at this time and hemodynamically stable.   [PR]    Clinical Course User Index [PR] Merlyn Lot, MD     As part of my medical decision making, I reviewed the following data within the [facility] notes reviewed and incorporated, Labs reviewed, notes from prior ED visits.   ____________________________________________   FINAL CLINICAL IMPRESSION(S) / ED DIAGNOSES  Final diagnoses:  Chest pain, unspecified type  Palpitations  Hypertension, unspecified type      NEW MEDICATIONS STARTED DURING THIS VISIT:  New Prescriptions   No medications on file     Note:  This document was prepared using Dragon voice recognition software and may include unintentional dictation errors.    Merlyn Lot, MD 12/18/17 Hoy Register    Merlyn Lot, MD 12/18/17 2020

## 2017-12-18 NOTE — ED Notes (Signed)
Pt ambulatory to toilet independently. 

## 2017-12-19 ENCOUNTER — Observation Stay (HOSPITAL_BASED_OUTPATIENT_CLINIC_OR_DEPARTMENT_OTHER)
Admit: 2017-12-19 | Discharge: 2017-12-19 | Disposition: A | Discharge status: Home / Self Care | Payer: BLUE CROSS/BLUE SHIELD | Attending: Internal Medicine | Admitting: Internal Medicine

## 2017-12-19 DIAGNOSIS — I479 Paroxysmal tachycardia, unspecified: Secondary | ICD-10-CM

## 2017-12-19 DIAGNOSIS — I340 Nonrheumatic mitral (valve) insufficiency: Secondary | ICD-10-CM | POA: Diagnosis not present

## 2017-12-19 DIAGNOSIS — R9431 Abnormal electrocardiogram [ECG] [EKG]: Secondary | ICD-10-CM | POA: Diagnosis not present

## 2017-12-19 DIAGNOSIS — I10 Essential (primary) hypertension: Secondary | ICD-10-CM | POA: Diagnosis not present

## 2017-12-19 DIAGNOSIS — I517 Cardiomegaly: Secondary | ICD-10-CM

## 2017-12-19 DIAGNOSIS — R002 Palpitations: Principal | ICD-10-CM

## 2017-12-19 DIAGNOSIS — R079 Chest pain, unspecified: Secondary | ICD-10-CM | POA: Diagnosis not present

## 2017-12-19 DIAGNOSIS — I160 Hypertensive urgency: Secondary | ICD-10-CM | POA: Diagnosis not present

## 2017-12-19 DIAGNOSIS — K219 Gastro-esophageal reflux disease without esophagitis: Secondary | ICD-10-CM | POA: Diagnosis not present

## 2017-12-19 DIAGNOSIS — F419 Anxiety disorder, unspecified: Secondary | ICD-10-CM

## 2017-12-19 LAB — TROPONIN I
Troponin I: <0.03 ng/mL (ref <0.03)
Troponin I: <0.03 ng/mL (ref <0.03)

## 2017-12-19 LAB — ECHOCARDIOGRAM COMPLETE
Height: 62 in
Weight: 3129.6 oz

## 2017-12-19 MED ORDER — CLONIDINE HCL 0.1 MG PO TABS: 0.1000 mg | ORAL_TABLET | Freq: Every day | ORAL | 1 refills | Status: DC | PRN

## 2017-12-19 MED ORDER — CLONIDINE HCL 0.1 MG PO TABS: 0.1000 mg | ORAL_TABLET | Freq: Every day | ORAL | Status: DC | PRN

## 2017-12-19 MED ORDER — PROPRANOLOL HCL 20 MG PO TABS: 20.0000 mg | ORAL_TABLET | Freq: Two times a day (BID) | ORAL | 1 refills | Status: DC | PRN

## 2017-12-19 MED ORDER — PROPRANOLOL HCL 20 MG PO TABS
20.0000 mg | ORAL_TABLET | Freq: Two times a day (BID) | ORAL | Status: DC | PRN
  Filled 2017-12-19: qty 1

## 2017-12-19 NOTE — Discharge Summary (Signed)
[facility] [facility] [place] NAME: Anita Massey    MR#:  [id number]  DATE OF BIRTH:  06/03/1980  DATE OF ADMISSION:  12/18/2017   ADMITTING PHYSICIAN: Nicholes Mango, MD  DATE OF DISCHARGE: 12/19/2017  1:50 PM  PRIMARY CARE PHYSICIAN: Wellington Hampshire, MD   ADMISSION DIAGNOSIS:   Palpitations [R00.2] Chest pain, unspecified type [R07.9] Hypertension, unspecified type [I10]  DISCHARGE DIAGNOSIS:   Active Problems:   Palpitations   SECONDARY DIAGNOSIS:   Past Medical History:  Diagnosis Date  . Anemia   . Anxiety state, unspecified   . Blood dyscrasia    PROTHROMBIN G 93267 HETEROZYGOSITY (FACTOR 2)  . Clotting disorder ([place])   . GERD (gastroesophageal reflux disease)    OCC  . 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 Hypertension   . Multiple joint pain 09/04/2015  . Obesity   . Rosacea   . Undiagnosed cardiac murmurs   . Unspecified essential hypertension     HOSPITAL COURSE:   37 year old female with past medical history significant for obesity, hypertension, GERD, prior history of palpitations presents to hospital secondary to palpitations associated with dyspnea.  1.  Palpitations-concern for underlying cardiac arrhythmias -Had an outpatient monitor in the past which was normal -Likely might need a longer monitor. -Echocardiogram showing LVH, EF 60 to 65%.  No wall motion abnormalities.  Mild mitral valve regurgitation.  Normal pulmonary artery pressures. And outpatient EP eval as outpatient if needed -Troponins are negative.  Asymptomatic now -Continue outpatient Toprol. -Fibrin derivatives are negative, less likely to be pulmonary embolism -Cardiology recommended as needed propranolol for palpitations at home and clonidine as needed for elevated blood pressure.  Prescriptions dispensed.  2.  Depression and anxiety-continue Zoloft.  3.  Hypertension-on metoprolol at home.  Patient is up and ambulatory.  Will  be discharged home    DISCHARGE CONDITIONS:   Guarded  CONSULTS OBTAINED:   Treatment Team:  Minna Merritts, MD  DRUG ALLERGIES:   Allergies  Allergen Reactions  . Biaxin [Clarithromycin] Hives  . Toradol [Ketorolac Tromethamine]     Chest pain   . Prednisone Anxiety and Palpitations    "Makes me feel crazy"  . Tape Rash    Paper tape is fine   DISCHARGE MEDICATIONS:   Allergies as of 12/19/2017      Reactions   Biaxin [clarithromycin] Hives   Toradol [ketorolac Tromethamine]    Chest pain    Prednisone Anxiety, Palpitations   "Makes me feel crazy"   Tape Rash   Paper tape is fine      Medication List    STOP taking these medications   hyoscyamine 0.125 MG tablet Commonly known as:  LEVSIN, ANASPAZ     TAKE these medications   ALKA-SELTZER HEARTBURN + GAS PO Take 1 tablet by mouth as needed.   aspirin-acetaminophen-caffeine 250-250-65 MG tablet Commonly known as:  EXCEDRIN MIGRAINE Take 1 tablet by mouth every 6 (six) hours as needed for headache.   cloNIDine 0.1 MG tablet Commonly known as:  CATAPRES Take 1 tablet (0.1 mg total) by mouth daily as needed (for HTN, for SBP >175 or BDP>100).   doxycycline 100 MG capsule Commonly known as:  VIBRAMYCIN Take 100 mg by mouth daily as needed (rosacea).   ibuprofen 200 MG tablet Commonly known as:  ADVIL,MOTRIN Take 200-400 mg by mouth every 6 (six) hours as needed for mild pain (depends on pain level if takes 1-2 tablets).  Ivermectin 1 % Crea Apply 1 application topically daily as needed.   metoprolol succinate 100 MG 24 hr tablet Commonly known as:  TOPROL-XL TAKE 1 TABLET BY MOUTH ONCE DAILY WITH OR IMMEDIATELY FOLLLOWING A MEAL   ondansetron 4 MG disintegrating tablet Commonly known as:  ZOFRAN-ODT Take 1 tablet (4 mg total) by mouth every 8 (eight) hours as needed for nausea or vomiting.   propranolol 20 MG tablet Commonly known as:  INDERAL Take 1 tablet (20 mg total) by mouth 2 (two)  times daily as needed (tachycardia for HR>120).   RA GUMMY VITAMINS & MINERALS Chew Chew 2 tablets by mouth daily. Women's gummies   sertraline 100 MG tablet Commonly known as:  ZOLOFT Take 0.5 tablets (50 mg total) by mouth daily.        DISCHARGE INSTRUCTIONS:   1.  PCP follow-up in 1 to 2 weeks 2.  Cardiology follow-up in 1 to 2 weeks  DIET:   Cardiac diet  ACTIVITY:   Activity as tolerated  OXYGEN:   Home Oxygen: No.  Oxygen Delivery: room air  DISCHARGE LOCATION:   home   If you experience worsening of your admission symptoms, develop shortness of breath, life threatening emergency, suicidal or homicidal thoughts you must seek medical attention immediately by calling 911 or calling your MD immediately  if symptoms less severe.  You Must read complete instructions/literature along with all the possible adverse reactions/side effects for all the Medicines you take and that have been prescribed to you. Take any new Medicines after you have completely understood and accpet all the possible adverse reactions/side effects.   Please note  You were cared for by a hospitalist during your hospital stay. If you have any questions about your discharge medications or the care you received while you were in the hospital after you are discharged, you can call the unit and asked to speak with the hospitalist on call if the hospitalist that took care of you is not available. Once you are discharged, your primary care physician will handle any further medical issues. Please note that NO REFILLS for any discharge medications will be authorized once you are discharged, as it is imperative that you return to your primary care physician (or establish a relationship with a primary care physician if you do not have one) for your aftercare needs so that they can reassess your need for medications and monitor your lab values.    On the day of Discharge:  VITAL SIGNS:   Blood pressure  130/72, pulse 72, temperature 98.2 F (36.8 C), temperature source Oral, resp. rate 18, height 5\' 2"  (1.575 m), weight 88.7 kg, last menstrual period 12/03/2017, SpO2 98 %.  PHYSICAL EXAMINATION:   GENERAL:  37 y.o.-year-old obese patient lying in the bed with no acute distress.  EYES: Pupils equal, round, reactive to light and accommodation. No scleral icterus. Extraocular muscles intact.  HEENT: Head atraumatic, normocephalic. Oropharynx and nasopharynx clear.  NECK:  Supple, no jugular venous distention. No thyroid enlargement, no tenderness.  LUNGS: Normal breath sounds bilaterally, no wheezing, rales,rhonchi or crepitation. No use of accessory muscles of respiration.  CARDIOVASCULAR: S1, S2 normal. No murmurs, rubs, or gallops.  ABDOMEN: Soft, nontender, nondistended. Bowel sounds present. No organomegaly or mass.  EXTREMITIES: No pedal edema, cyanosis, or clubbing.  NEUROLOGIC: Cranial nerves II through XII are intact. Muscle strength 5/5 in all extremities. Sensation intact. Gait not checked.  PSYCHIATRIC: The patient is alert and oriented x 3.  SKIN: No  obvious rash, lesion, or ulcer.    DATA REVIEW:   CBC Recent Labs  Lab 12/18/17 1757  WBC 11.5*  HGB 14.2  HCT 42.1  PLT 319    Chemistries  Recent Labs  Lab 12/18/17 1757 12/18/17 1829  NA 138  --   K 3.5  --   CL 105  --   CO2 23  --   GLUCOSE 214*  --   BUN 12  --   CREATININE 0.61  --   CALCIUM 9.7  --   MG  --  1.7     Microbiology Results  Results for orders placed or performed during the hospital encounter of 02/16/16  Gastrointestinal Panel by PCR , Stool     Status: Abnormal   Collection Time: 02/16/16 10:48 PM  Result Value Ref Range Status   Campylobacter species NOT DETECTED NOT DETECTED Final   Plesimonas shigelloides NOT DETECTED NOT DETECTED Final   Salmonella species NOT DETECTED NOT DETECTED Final   Yersinia enterocolitica NOT DETECTED NOT DETECTED Final   Vibrio species NOT DETECTED NOT  DETECTED Final   Vibrio cholerae NOT DETECTED NOT DETECTED Final   Enteroaggregative E coli (EAEC) NOT DETECTED NOT DETECTED Final   Enteropathogenic E coli (EPEC) NOT DETECTED NOT DETECTED Final   Enterotoxigenic E coli (ETEC) NOT DETECTED NOT DETECTED Final   Shiga like toxin producing E coli (STEC) NOT DETECTED NOT DETECTED Final   Shigella/Enteroinvasive E coli (EIEC) NOT DETECTED NOT DETECTED Final   Cryptosporidium NOT DETECTED NOT DETECTED Final   Cyclospora cayetanensis NOT DETECTED NOT DETECTED Final   Entamoeba histolytica NOT DETECTED NOT DETECTED Final   Giardia lamblia NOT DETECTED NOT DETECTED Final   Adenovirus F40/41 NOT DETECTED NOT DETECTED Final   Astrovirus NOT DETECTED NOT DETECTED Final   Norovirus GI/GII DETECTED (A) NOT DETECTED Final    Comment: RESULT CALLED TO, READ BACK BY AND VERIFIED WITH: KENNY PATEL AT 0126 02/17/16.PMH    Rotavirus A NOT DETECTED NOT DETECTED Final   Sapovirus (I, II, IV, and V) NOT DETECTED NOT DETECTED Final  C difficile quick scan w PCR reflex     Status: None   Collection Time: 02/16/16 10:48 PM  Result Value Ref Range Status   C Diff antigen NEGATIVE NEGATIVE Final   C Diff toxin NEGATIVE NEGATIVE Final   C Diff interpretation No C. difficile detected.  Final    RADIOLOGY:  Dg Chest 2 View  Result Date: 12/18/2017 CLINICAL DATA:  Tachycardia and nausea. EXAM: CHEST - 2 VIEW COMPARISON:  09/06/2017 FINDINGS: The cardiac silhouette, mediastinal and hilar contours are normal in stable. Slight hyperinflation and a few linear areas of scarring change but no infiltrates, edema or effusions. The bony thorax is intact. IMPRESSION: Mild hyperinflation and chronic basilar scarring changes but no acute overlying pulmonary process. Electronically Signed   By: Marijo Sanes M.D.   On: 12/18/2017 18:53     Management plans discussed with the patient, family and they are in agreement.  CODE STATUS:     Code Status Orders  (From admission,  onward)         Start     Ordered   12/18/17 2050  Full code  Continuous     12/18/17 2052        Code Status History    Date Active Date Inactive Code Status Order ID Comments User Context   03/23/2016 1348 03/25/2016 1538 Full Code [id number]  Florene Glen, MD ED  TOTAL TIME TAKING CARE OF THIS PATIENT: 38 minutes.    Gladstone Lighter M.D on 12/19/2017 at 2:02 PM  Between 7am to 6pm - Pager - [phone]  After 6pm go to [email or web address] - Proofreader  [facility]  Office  [phone]  CC: Primary care physician; Wellington Hampshire, MD   Note: This dictation was prepared with Dragon dictation along with smaller phrase technology. Any transcriptional errors that result from this process are unintentional.

## 2017-12-19 NOTE — Consult Note (Signed)
Cardiology Consultation:   Patient ID: ZAYLI VILLAFUERTE MRN: [id number]; DOB: May 25, 1980  Admit date: 12/18/2017 Date of Consult: 12/19/2017  Primary Care Provider: Venia Carbon, MD Primary Cardiologist: Dr. Fletcher Anon Primary Electrophysiologist:  None    Patient Profile:   Anita Massey is a 37 y.o. female with a hx of HTN, palpitations, migraines, anxiety, anemia, factor II deficiency, ANA positive, homozygous MTHFR gene mutation, prothrombin heterozygosity, obesity, and former smoker who is being seen today for the evaluation of palpitations at the request of Dr. Tressia Miners.  History of Present Illness:   Anita Massey is a 37 yo female with PMH as above and previously worked up for palpitations and feelings of racing heart rate by [facility] in 2010 and 2015, as well as followed by her PCP for continued sx. She denies EtOH abuse (1 drink yearly), current tobacco or illicit drug use. No documented thyroid abnormalities or OSA. No family history of cardiac disease.   She was first seen in 2010 by Dr. Fletcher Anon with workup for palpitations and feelings of racing heart rate with associated chest tightness. Holter monitor showed no significant arrhythmia other than sinus tachycardia. She reported significant chest pain and DOE and subsequently underwent extensive workup including negative chest CT for pulmonary embolism. Echocardiogram at that time showed normal LVEF with evidence of trace pericardial effusion. Stress test was negative for ischemia. She was noted to be mildly hypertensive and therefore started on small dose BB with improvement in her symptoms.  She was seen again in 2015 by [facility] with admission at [facility] d/t malignant HTN. At that time, it was noted she had been trying to get pregnant with 2 miscarriages. She c/o periodic increases in BP associated with palpitations. On 02/10/2013, she was admitted at [facility] for malignant HTN with BP 201/100 and HR 106 bpm. She was seen by  Dr. Ellyn Hack at that time. Cardiac enzymes were negative. Electrolytes were repleted d/t hypomagnesemia and hypokalemia. Exam significant for II/IV systolic murmur. 02/11/2013 echo showed normal LVEF 60-65%, mild concentric LVH, and mild TR. Her labetalol was increased to 100mg  twice daily with recommendation for outpatient workup for secondary causes of HTN.   After discharge, she reportedly had another episode for which she went to the [facility] ED without admission. Per her PCP, Zoloft was then increased and clonazepam prescribed for suspected anxiety triggered increases in BP.   At follow-up 03/01/2013 with Dr. Fletcher Anon, EKG showed NSR with nonspecific t wave changes. It was noted that, although it was suspected BP elevation was d/t panic attacks and anxiety, it was important to exclude other secondary causes of HTN urgency. Workup for secondary hypertension thus included 24h urine metanephrine and catecholamines, cortisol levels, renal artery duplex, and aldosterone to renin ratio. This workup was all found to be negative with no evidence of fibromuscular dysplasia, renal artery stenosis,  or pheochromocytoma. She reported no further episodes on the increased dosage of labetalol with Zoloft and clonazepam. Therefore, the treatment plan was for continued medical management on labetalol with an extra dose as needed for BP above 160 SBP as there as no contraindication for pregnancy from a cardiac standpoint. Of note, it was also noted she had been diagnosed with a hypercoagulable state that might require anticoagulation during pregnancy.   Since that time, she has been followed by Dr. Silvio Pate for further episodes of feeling her heart racing, associated with nausea and with low potassium needing repletion. Of note, EMR is significant for repeat office visits d/t GI  issues including diarrhea, nausea, and rectal bleeding. She reportedly had her gallbladder removed in 2018 without alleviation of symptoms. Per Dr. Alla German  documentation, the plan was to undergo repeat urine testing once patient's insurance would cover the labs and try hyoscyamine.  On 12/18/2017, she reported to [facility] ED with c/o palpitations and racing heart rate. She reported that she was sitting at home in a chair and watching television that night when she suddenly developed palpitations and racing heart rate with associated SOB, nausea, diaphoresis, dizziness, and chest pain / tightness. This episode lasted for 10-15 minutes and had almost completely dissipated by the time of her presentation in the emergency department.   In the ED, she was again found to be hypokalemic and hypomagnesic as well as hyperglycemic and hypertensive with sinus tachycardia. LVH noted on EKG Vitals: BP 170/101, HR 127, RR 20, T 98.18F, SpO2 100% Labs: Na 138, K 3.5, Mg 1.7, glucose 214, Cr 0.61, BUN 12, WBC 11.5, Hgb 14.2, plts 319 Initial troponin negative (x3) TSH 2.223 Ddimer negative EKG: Sinus tachycardia with LVH CXR: No acute cardiac findings Meds: Started on ASA.   Cardiology consulted and admitted to telemetry to repeat cardiac enzymes. She was chest free at that time and no reported chest pain during today's exam. Also during today's exam, the patient reported that she is unable to identify a trigger for her episodes of racing heart rate and palpitations but feels they occur mainly at night. She reportedly watches her salt intake. Other than "chasing after her kids," she does not report regular physical activity. She stated that her heart rate jumps up whenever she has tried to workout in the past. She does not feel that the episodes start after eating. She is restless at night when sleeping but reported she usually sleeps 6-7 hours. She has noted increasing fatigue, thirst, and inability to lose weight with TSH within limits as above.     Past Medical History:  Diagnosis Date  . Anemia   . Anxiety state, unspecified   . Blood dyscrasia    PROTHROMBIN G  20210 HETEROZYGOSITY (FACTOR 2)  . Clotting disorder ([place])   . GERD (gastroesophageal reflux disease)    OCC  . 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[place])   . Hypertension   . Multiple joint pain 09/04/2015  . Obesity   . Rosacea   . Undiagnosed cardiac murmurs   . Unspecified essential hypertension     Past Surgical History:  Procedure Laterality Date  . APPENDECTOMY  2009   [facility]  . CESAREAN SECTION  2016  . CHOLECYSTECTOMY N/A 03/20/2016   Procedure: LAPAROSCOPIC CHOLECYSTECTOMY;  Surgeon: Jules Husbands, MD;  Location: [facility] ORS;  Service: General;  Laterality: N/A;  . DILATION AND CURETTAGE OF UTERUS       Home Medications:  Prior to Admission medications   Medication Sig Start Date End Date Taking? Authorizing Provider  aspirin-acetaminophen-caffeine (EXCEDRIN MIGRAINE) [phone] MG tablet Take 1 tablet by mouth every 6 (six) hours as needed for headache.   Yes [provider]  Calcium Carbonate-Simethicone (ALKA-SELTZER HEARTBURN + GAS PO) Take 1 tablet by mouth as needed.   Yes [provider]  doxycycline (VIBRAMYCIN) 100 MG capsule Take 100 mg by mouth daily as needed (rosacea).    Yes [provider]  ibuprofen (ADVIL,MOTRIN) 200 MG tablet Take 200-400 mg by mouth every 6 (six) hours as needed for mild pain (depends on pain level if takes 1-2 tablets).   Yes [provider]  Ivermectin 1 % CREA Apply 1 application topically daily as needed.   Yes [provider]  metoprolol succinate (TOPROL-XL) 100 MG 24 hr tablet TAKE 1 TABLET BY MOUTH ONCE DAILY WITH OR IMMEDIATELY FOLLLOWING A MEAL 09/19/17  Yes Venia Carbon, MD  ondansetron (ZOFRAN ODT) 4 MG disintegrating tablet Take 1 tablet (4 mg total) by mouth every 8 (eight) hours as needed for nausea or vomiting. 09/19/17  Yes Venia Carbon, MD  Pediatric Multivit-Minerals-C (RA GUMMY VITAMINS & MINERALS) CHEW Chew 2 tablets by mouth daily. Women's gummies   Yes [provider]  sertraline (ZOLOFT) 100 MG tablet Take 0.5 tablets (50 mg total) by mouth daily. 09/19/17  Yes Venia Carbon, MD  cloNIDine (CATAPRES) 0.1 MG tablet Take 1 tablet (0.1 mg total) by mouth daily as needed (for HTN, for SBP >175 or BDP>100). 12/19/17   Gladstone Lighter, MD  hyoscyamine (LEVSIN, ANASPAZ) 0.125 MG tablet Take 1 tablet (0.125 mg total) by mouth 3 (three) times daily as needed. To prevent diarrhea Patient not taking: Reported on 12/18/2017 09/19/17   Venia Carbon, MD  propranolol (INDERAL) 20 MG tablet Take 1 tablet (20 mg total) by mouth 2 (two) times daily as needed (tachycardia for HR>120). 12/19/17   Gladstone Lighter, MD    Inpatient Medications: Scheduled Meds: . enoxaparin (LOVENOX) injection  40 mg Subcutaneous Q24H  . metoprolol succinate  100 mg Oral Daily  . sertraline  50 mg Oral Daily   Continuous Infusions:  PRN Meds: acetaminophen, aspirin-acetaminophen-caffeine, ibuprofen, ondansetron (ZOFRAN) IV  Allergies:    Allergies  Allergen Reactions  . Biaxin [Clarithromycin] Hives  . Toradol [Ketorolac Tromethamine]     Chest pain   . Prednisone Anxiety and Palpitations    "Makes me feel crazy"  . Tape Rash    Paper tape is fine    Social History:   Social History   Socioeconomic History  . Marital status: Married    Spouse name: Not on file  . Number of children: 2  . Years of education: Not on file  . Highest education level: Not on file  Occupational History  . Occupation: Agricultural engineer  Social Needs  . Financial resource strain: Not on file  . Food insecurity:    Worry: Not on file    Inability: Not on file  . Transportation needs:    Medical: Not on file    Non-medical: Not on file  Tobacco Use  . Smoking status: Former Smoker    Packs/day: 0.25    Years: 1.00    Pack years: 0.25    Types: Cigarettes    Last attempt to quit: 03/13/1998    Years since quitting: 19.7  . Smokeless tobacco: Never Used  Substance and Sexual Activity  .  Alcohol use: No  . Drug use: No  . Sexual activity: Not on file  Lifestyle  . Physical activity:    Days per week: Not on file    Minutes per session: Not on file  . Stress: Not on file  Relationships  . Social connections:    Talks on phone: Not on file    Gets together: Not on file    Attends religious service: Not on file    Active member of club or organization: Not on file    Attends meetings of clubs or organizations: Not on file    Relationship status: Not on file  . Intimate partner violence:    Fear of current  or ex partner: Not on file    Emotionally abused: Not on file    Physically abused: Not on file    Forced sexual activity: Not on file  Other Topics Concern  . Not on file  Social History Narrative           Family History:   No family history of cardiac disease or early cardiac death. Family History  Problem Relation Age of Onset  . Mitral valve prolapse Mother   . Arrhythmia Mother   . Hypertension Mother   . Diabetes Mother   . Hypertension Father   . Hyperlipidemia Father   . Heart disease Maternal Grandfather   . Lung cancer Maternal Grandfather   . Cancer Maternal Grandfather   . Diabetes Brother   . Heart disease Maternal Aunt   . Diabetes Maternal Aunt   . Depression Maternal Aunt   . Cancer Maternal Grandmother        ovarian melanoma     ROS:  Please see the history of present illness.  Review of Systems  Constitutional: Positive for diaphoresis and malaise/fatigue. Negative for chills, fever and weight loss.  Cardiovascular: Positive for chest pain and palpitations. Negative for orthopnea and leg swelling.       2 pillows at night but does not seem related to orthopnea  Gastrointestinal: Positive for diarrhea and nausea.  Genitourinary: Positive for frequency and urgency.  Neurological: Positive for dizziness.  Endo/Heme/Allergies: Positive for polydipsia.  Psychiatric/Behavioral: The patient is nervous/anxious. The patient does not  have insomnia.        Restless sleep    All other ROS reviewed and negative.     Physical Exam/Data:   Vitals:   12/18/17 2100 12/18/17 2143 12/19/17 0418 12/19/17 0515  BP: 135/85 (!) 178/98 129/73   Pulse: 93 87 76   Resp: 15 18 17    Temp:  97.7 F (36.5 C) 98.4 F (36.9 C)   TempSrc:  Oral Oral   SpO2: 98% 100% 98%   Weight:    88.7 kg  Height:        Intake/Output Summary (Last 24 hours) at 12/19/2017 0823 Last data filed at 12/18/2017 1935 Gross per 24 hour  Intake 1000 ml  Output -  Net 1000 ml   Filed Weights   12/18/17 1755 12/19/17 0515  Weight: 86.2 kg 88.7 kg   Body mass index is 35.78 kg/m.  General:  Obese well nourished, well developed, in no acute distress HEENT: normal Neck: no JVD Vascular: No carotid bruits; FA pulses 2+ bilaterally without bruits  Cardiac:  normal S1, S2; RRR; no murmur Lungs:  Coarse bilateral breath sounds at bases, no wheezing, rhonchi   Abd: obese, soft, nontender, no hepatomegaly  Ext: no bilateral lower extremity edema Musculoskeletal:  No deformities, BUE and BLE strength normal and equal Skin: warm and dry  Neuro: no focal abnormalities noted Psych:  Normal affect   EKG: Refer to HPI Telemetry:  Telemetry was personally reviewed and demonstrates:  SR, Sinus tachycardia  CV Studies:   Relevant CV Studies:  Pending 12/6 TTE  Scan under procedures tab for 02/11/2013 TTE with results as above in HPI  Laboratory Data:   Chemistry Recent Labs  Lab 12/18/17 1757  NA 138  K 3.5  CL 105  CO2 23  GLUCOSE 214*  BUN 12  CREATININE 0.61  CALCIUM 9.7  GFRNONAA >60  GFRAA >60  ANIONGAP 10    No results for input(s): PROT, ALBUMIN, AST,  ALT, ALKPHOS, BILITOT in the last 168 hours. Hematology Recent Labs  Lab 12/18/17 1757  WBC 11.5*  RBC 4.82  HGB 14.2  HCT 42.1  MCV 87.3  MCH 29.5  MCHC 33.7  RDW 12.6  PLT 319   Cardiac Enzymes Recent Labs  Lab 12/18/17 1757 12/18/17 2216 12/19/17 0113  12/19/17 0428  TROPONINI <0.03 <0.03 <0.03 <0.03   No results for input(s): TROPIPOC in the last 168 hours.  BNPNo results for input(s): BNP, PROBNP in the last 168 hours.  DDimer No results for input(s): DDIMER in the last 168 hours.  Radiology/Studies:  Dg Chest 2 View  Result Date: 12/18/2017 CLINICAL DATA:  Tachycardia and nausea. EXAM: CHEST - 2 VIEW COMPARISON:  09/06/2017 FINDINGS: The cardiac silhouette, mediastinal and hilar contours are normal in stable. Slight hyperinflation and a few linear areas of scarring change but no infiltrates, edema or effusions. The bony thorax is intact. IMPRESSION: Mild hyperinflation and chronic basilar scarring changes but no acute overlying pulmonary process. Electronically Signed   By: Marijo Sanes M.D.   On: 12/18/2017 18:53    Assessment and Plan:   Chest pain, racing heart rate, palpitations  - As above in HPI and reportedly also associated with labile BP. Previous workup negative as above. - R/o for ACS - No arrhythmias or acute changes on EKG or telemetry though LVH is noted on EKG. Troponin negative x3. Ddimer negative. Pending official read of updated TTE. - Suspect likely non-cardiac etiology with episodes possibly triggered by anxiety given patient's report that her Zoloft no longer appropriately controls her symptoms. TSH within limits. Possible future noncardiac workup could include endocrinology / updated A1C or hematology / ANA as above. Could also consider outpatient sleep study or outpatient GI workup for repeated GI complaints and electrolyte imbalances. - No plan for invasive ischemic or cardiac workup this admission. As above, suspicion for anxiety component to episodes. LVH noted on EKG and echo. Will plan to discharge with outpatient prescription for PRN Clonidine 0.1mg  for anxiety and propanolol 20mg  for palpitations prn to take in addition to her metoprolol and PCP prescribed hyoscyamine   HTN - BP controlled - Continue medical  management as above with addition of propanolol and clonidine   Hypokalemia and hypomagnesia  - Replete electrolytes with K goal 4.0, Mg goal 2.0 - Per IM  Anxiety - Could consider changing anxiety medications given the above - Per PCP as outpatient   For questions or updates, please contact [facility] Please consult [email or web address] for contact info under     Signed, Arvil Chaco, PA-C  12/19/2017 8:23 AM

## 2017-12-19 NOTE — Progress Notes (Signed)
*  PRELIMINARY RESULTS* Echocardiogram 2D Echocardiogram has been performed.  Sherrie Sport 12/19/2017, 12:11 PM

## 2017-12-19 NOTE — Progress Notes (Signed)
[place] [facility] [place] NAME: Anita Massey    MR#:  [id number]  DATE OF BIRTH:  May 14, 1980  SUBJECTIVE:  CHIEF COMPLAINT:   Chief Complaint  Patient presents with  . Palpitations   -Came in with intermittent palpitations associated with dyspnea and chest pain -None now.  Resting heart rate is in the 70s and in sinus rhythm.  REVIEW OF SYSTEMS:  Review of Systems  Constitutional: Negative for chills, fever and malaise/fatigue.  HENT: Negative for ear discharge, hearing loss and nosebleeds.   Eyes: Negative for blurred vision and double vision.  Respiratory: Positive for shortness of breath. Negative for cough and wheezing.   Cardiovascular: Positive for palpitations. Negative for chest pain.  Gastrointestinal: Negative for abdominal pain, constipation, diarrhea, nausea and vomiting.  Genitourinary: Negative for dysuria.  Musculoskeletal: Negative for myalgias.  Neurological: Negative for dizziness, focal weakness, seizures, weakness and headaches.  Psychiatric/Behavioral: Negative for depression.    DRUG ALLERGIES:   Allergies  Allergen Reactions  . Biaxin [Clarithromycin] Hives  . Toradol [Ketorolac Tromethamine]     Chest pain   . Prednisone Anxiety and Palpitations    "Makes me feel crazy"  . Tape Rash    Paper tape is fine    VITALS:  Blood pressure 130/72, pulse 72, temperature 98.2 F (36.8 C), temperature source Oral, resp. rate 18, height 5\' 2"  (1.575 m), weight 88.7 kg, last menstrual period 12/03/2017, SpO2 98 %.  PHYSICAL EXAMINATION:  Physical Exam   GENERAL:  37 y.o.-year-old obese patient lying in the bed with no acute distress.  EYES: Pupils equal, round, reactive to light and accommodation. No scleral icterus. Extraocular muscles intact.  HEENT: Head atraumatic, normocephalic. Oropharynx and nasopharynx clear.  NECK:  Supple, no jugular venous distention. No thyroid enlargement, no tenderness.  LUNGS:  Normal breath sounds bilaterally, no wheezing, rales,rhonchi or crepitation. No use of accessory muscles of respiration.  CARDIOVASCULAR: S1, S2 normal. No murmurs, rubs, or gallops.  ABDOMEN: Soft, nontender, nondistended. Bowel sounds present. No organomegaly or mass.  EXTREMITIES: No pedal edema, cyanosis, or clubbing.  NEUROLOGIC: Cranial nerves II through XII are intact. Muscle strength 5/5 in all extremities. Sensation intact. Gait not checked.  PSYCHIATRIC: The patient is alert and oriented x 3.  SKIN: No obvious rash, lesion, or ulcer.    LABORATORY PANEL:   CBC Recent Labs  Lab 12/18/17 1757  WBC 11.5*  HGB 14.2  HCT 42.1  PLT 319   ------------------------------------------------------------------------------------------------------------------  Chemistries  Recent Labs  Lab 12/18/17 1757 12/18/17 1829  NA 138  --   K 3.5  --   CL 105  --   CO2 23  --   GLUCOSE 214*  --   BUN 12  --   CREATININE 0.61  --   CALCIUM 9.7  --   MG  --  1.7   ------------------------------------------------------------------------------------------------------------------  Cardiac Enzymes Recent Labs  Lab 12/19/17 0428  TROPONINI <0.03   ------------------------------------------------------------------------------------------------------------------  RADIOLOGY:  Dg Chest 2 View  Result Date: 12/18/2017 CLINICAL DATA:  Tachycardia and nausea. EXAM: CHEST - 2 VIEW COMPARISON:  09/06/2017 FINDINGS: The cardiac silhouette, mediastinal and hilar contours are normal in stable. Slight hyperinflation and a few linear areas of scarring change but no infiltrates, edema or effusions. The bony thorax is intact. IMPRESSION: Mild hyperinflation and chronic basilar scarring changes but no acute overlying pulmonary process. Electronically Signed   By: Marijo Sanes M.D.   On: 12/18/2017 18:53  EKG:   Orders placed or performed during the hospital encounter of 12/18/17  . ED EKG within  10 minutes  . ED EKG within 10 minutes  . EKG 12-Lead  . EKG 12-Lead    ASSESSMENT AND PLAN:   37 year old female with past medical history significant for obesity, hypertension, GERD, prior history of palpitations presents to hospital secondary to palpitations associated with dyspnea.  1.  Palpitations-concern for underlying cardiac arrhythmias -Had an outpatient monitor in the past which was normal -Likely might need a longer monitor. -Echocardiogram to rule out any structural heart disease and outpatient EP eval -Troponins are negative.  Asymptomatic now -On telemetry with normal sinus rhythm heart rate between 50s to 70s -Continue outpatient Toprol. -Fibrin derivatives are negative, less likely to be pulmonary embolism  2.  Depression and anxiety-continue Zoloft.  3.  Hypertension-only on metoprolol at home.  4.  Hypomagnesemia-magnesium level 1.7.  Given cardiac arrhythmias concern-replaced orally.  5.  DVT prophylaxis-Lovenox  Discharge disposition based on cardiology recommendations   All the records are reviewed and case discussed with Care Management/Social Workerr. Management plans discussed with the patient, family and they are in agreement.  CODE STATUS: Full code  TOTAL TIME TAKING CARE OF THIS PATIENT: 38 minutes.   POSSIBLE D/C IN 1-2 DAYS, DEPENDING ON CLINICAL CONDITION.   Gladstone Lighter M.D on 12/19/2017 at 11:01 AM  Between 7am to 6pm - Pager - [phone]  After 6pm go to [email or web address] - password [email or web address] [facility]  Office  [phone]  CC: Primary care physician; Wellington Hampshire, MD

## 2017-12-20 LAB — HIV ANTIBODY (ROUTINE TESTING W REFLEX): HIV Screen 4th Generation wRfx: NONREACTIVE

## 2017-12-26 ENCOUNTER — Ambulatory Visit (INDEPENDENT_AMBULATORY_CARE_PROVIDER_SITE_OTHER): Payer: BLUE CROSS/BLUE SHIELD | Admitting: Internal Medicine

## 2017-12-26 ENCOUNTER — Encounter: Payer: Self-pay | Admitting: Internal Medicine

## 2017-12-26 VITALS — BP 124/86 | HR 69 | Temp 98.1°F | Ht 62.0 in | Wt 197.0 lb

## 2017-12-26 DIAGNOSIS — R7309 Other abnormal glucose: Secondary | ICD-10-CM | POA: Insufficient documentation

## 2017-12-26 DIAGNOSIS — D3500 Benign neoplasm of unspecified adrenal gland: Secondary | ICD-10-CM

## 2017-12-26 DIAGNOSIS — R002 Palpitations: Secondary | ICD-10-CM

## 2017-12-26 LAB — GLUCOSE, RANDOM: Glucose, Bld: 105 mg/dL — ABNORMAL HIGH (ref 70–99)

## 2017-12-26 LAB — HEMOGLOBIN A1C: Hgb A1c MFr Bld: 5.9 % (ref 4.6–6.5)

## 2017-12-26 LAB — CORTISOL: Cortisol, Plasma: 4.9 ug/dL

## 2017-12-26 LAB — TESTOSTERONE: Testosterone: 24.07 ng/dL (ref 15.00–40.00)

## 2017-12-26 NOTE — Assessment & Plan Note (Signed)
Concerning for arrhythmia Mild LVH No persistent elevated BP Need eval of adrenal adenoma Cardiology follow up

## 2017-12-26 NOTE — Assessment & Plan Note (Signed)
Will reorder the testing we tried to do before

## 2017-12-26 NOTE — Patient Instructions (Signed)
DASH Eating Plan DASH stands for "Dietary Approaches to Stop Hypertension." The DASH eating plan is a healthy eating plan that has been shown to reduce high blood pressure (hypertension). It may also reduce your risk for type 2 diabetes, heart disease, and stroke. The DASH eating plan may also help with weight loss. What are tips for following this plan? General guidelines  Avoid eating more than 2,300 mg (milligrams) of salt (sodium) a day. If you have hypertension, you may need to reduce your sodium intake to 1,500 mg a day.  Limit alcohol intake to no more than 1 drink a day for nonpregnant women and 2 drinks a day for men. One drink equals 12 oz of beer, 5 oz of wine, or 1 oz of hard liquor.  Work with your health care provider to maintain a healthy body weight or to lose weight. Ask what an ideal weight is for you.  Get at least 30 minutes of exercise that causes your heart to beat faster (aerobic exercise) most days of the week. Activities may include walking, swimming, or biking.  Work with your health care provider or diet and nutrition specialist (dietitian) to adjust your eating plan to your individual calorie needs. Reading food labels  Check food labels for the amount of sodium per serving. Choose foods with less than 5 percent of the Daily Value of sodium. Generally, foods with less than 300 mg of sodium per serving fit into this eating plan.  To find whole grains, look for the word "whole" as the first word in the ingredient list. Shopping  Buy products labeled as "low-sodium" or "no salt added."  Buy fresh foods. Avoid canned foods and premade or frozen meals. Cooking  Avoid adding salt when cooking. Use salt-free seasonings or herbs instead of table salt or sea salt. Check with your health care provider or pharmacist before using salt substitutes.  Do not fry foods. Cook foods using healthy methods such as baking, boiling, grilling, and broiling instead.  Cook with  heart-healthy oils, such as olive, canola, soybean, or sunflower oil. Meal planning   Eat a balanced diet that includes: ? 5 or more servings of fruits and vegetables each day. At each meal, try to fill half of your plate with fruits and vegetables. ? Up to 6-8 servings of whole grains each day. ? Less than 6 oz of lean meat, poultry, or fish each day. A 3-oz serving of meat is about the same size as a deck of cards. One egg equals 1 oz. ? 2 servings of low-fat dairy each day. ? A serving of nuts, seeds, or beans 5 times each week. ? Heart-healthy fats. Healthy fats called Omega-3 fatty acids are found in foods such as flaxseeds and coldwater fish, like sardines, salmon, and mackerel.  Limit how much you eat of the following: ? Canned or prepackaged foods. ? Food that is high in trans fat, such as fried foods. ? Food that is high in saturated fat, such as fatty meat. ? Sweets, desserts, sugary drinks, and other foods with added sugar. ? Full-fat dairy products.  Do not salt foods before eating.  Try to eat at least 2 vegetarian meals each week.  Eat more home-cooked food and less restaurant, buffet, and fast food.  When eating at a restaurant, ask that your food be prepared with less salt or no salt, if possible. What foods are recommended? The items listed may not be a complete list. Talk with your dietitian about what   dietary choices are best for you. Grains Whole-grain or whole-wheat bread. Whole-grain or whole-wheat pasta. Brown rice. Oatmeal. Quinoa. Bulgur. Whole-grain and low-sodium cereals. Pita bread. Low-fat, low-sodium crackers. Whole-wheat flour tortillas. Vegetables Fresh or frozen vegetables (raw, steamed, roasted, or grilled). Low-sodium or reduced-sodium tomato and vegetable juice. Low-sodium or reduced-sodium tomato sauce and tomato paste. Low-sodium or reduced-sodium canned vegetables. Fruits All fresh, dried, or frozen fruit. Canned fruit in natural juice (without  added sugar). Meat and other protein foods Skinless chicken or turkey. Ground chicken or turkey. Pork with fat trimmed off. Fish and seafood. Egg whites. Dried beans, peas, or lentils. Unsalted nuts, nut butters, and seeds. Unsalted canned beans. Lean cuts of beef with fat trimmed off. Low-sodium, lean deli meat. Dairy Low-fat (1%) or fat-free (skim) milk. Fat-free, low-fat, or reduced-fat cheeses. Nonfat, low-sodium ricotta or cottage cheese. Low-fat or nonfat yogurt. Low-fat, low-sodium cheese. Fats and oils Soft margarine without trans fats. Vegetable oil. Low-fat, reduced-fat, or light mayonnaise and salad dressings (reduced-sodium). Canola, safflower, olive, soybean, and sunflower oils. Avocado. Seasoning and other foods Herbs. Spices. Seasoning mixes without salt. Unsalted popcorn and pretzels. Fat-free sweets. What foods are not recommended? The items listed may not be a complete list. Talk with your dietitian about what dietary choices are best for you. Grains Baked goods made with fat, such as croissants, muffins, or some breads. Dry pasta or rice meal packs. Vegetables Creamed or fried vegetables. Vegetables in a cheese sauce. Regular canned vegetables (not low-sodium or reduced-sodium). Regular canned tomato sauce and paste (not low-sodium or reduced-sodium). Regular tomato and vegetable juice (not low-sodium or reduced-sodium). Pickles. Olives. Fruits Canned fruit in a light or heavy syrup. Fried fruit. Fruit in cream or butter sauce. Meat and other protein foods Fatty cuts of meat. Ribs. Fried meat. Bacon. Sausage. Bologna and other processed lunch meats. Salami. Fatback. Hotdogs. Bratwurst. Salted nuts and seeds. Canned beans with added salt. Canned or smoked fish. Whole eggs or egg yolks. Chicken or turkey with skin. Dairy Whole or 2% milk, cream, and half-and-half. Whole or full-fat cream cheese. Whole-fat or sweetened yogurt. Full-fat cheese. Nondairy creamers. Whipped toppings.  Processed cheese and cheese spreads. Fats and oils Butter. Stick margarine. Lard. Shortening. Ghee. Bacon fat. Tropical oils, such as coconut, palm kernel, or palm oil. Seasoning and other foods Salted popcorn and pretzels. Onion salt, garlic salt, seasoned salt, table salt, and sea salt. Worcestershire sauce. Tartar sauce. Barbecue sauce. Teriyaki sauce. Soy sauce, including reduced-sodium. Steak sauce. Canned and packaged gravies. Fish sauce. Oyster sauce. Cocktail sauce. Horseradish that you find on the shelf. Ketchup. Mustard. Meat flavorings and tenderizers. Bouillon cubes. Hot sauce and Tabasco sauce. Premade or packaged marinades. Premade or packaged taco seasonings. Relishes. Regular salad dressings. Where to find more information:  National Heart, Lung, and Blood Institute: www.nhlbi.nih.gov  American Heart Association: www.heart.org Summary  The DASH eating plan is a healthy eating plan that has been shown to reduce high blood pressure (hypertension). It may also reduce your risk for type 2 diabetes, heart disease, and stroke.  With the DASH eating plan, you should limit salt (sodium) intake to 2,300 mg a day. If you have hypertension, you may need to reduce your sodium intake to 1,500 mg a day.  When on the DASH eating plan, aim to eat more fresh fruits and vegetables, whole grains, lean proteins, low-fat dairy, and heart-healthy fats.  Work with your health care provider or diet and nutrition specialist (dietitian) to adjust your eating plan to your individual   calorie needs. This information is not intended to replace advice given to you by your health care provider. Make sure you discuss any questions you have with your health care provider. Document Released: 12/20/2010 Document Revised: 12/25/2015 Document Reviewed: 12/25/2015 Elsevier Interactive Patient Education  2018 [employer] Inc.  

## 2017-12-26 NOTE — Progress Notes (Signed)
Subjective:    Patient ID: Anita Massey, female    DOB: Dec 06, 1980, 37 y.o.   MRN: [id number]  HPI Here for hospital follow up Had similar spell---heart started racing and skipping beats Felt nauseated Checked BP 180/110 Pulse was 110---?irregular (BP cuff stated this) Echo okay---just some LV thickening  Still gets diarrhea at times Not associated with the spells  Worried about the elevated sugar Non fasting was 214 Not careful about her eating--has cut out pork mostly but still high calorie foods  Current Outpatient Medications on File Prior to Visit  Medication Sig Dispense Refill  . aspirin-acetaminophen-caffeine (EXCEDRIN MIGRAINE) 250-250-65 MG tablet Take 1 tablet by mouth every 6 (six) hours as needed for headache.    . Calcium Carbonate-Simethicone (ALKA-SELTZER HEARTBURN + GAS PO) Take 1 tablet by mouth as needed.    . cloNIDine (CATAPRES) 0.1 MG tablet Take 1 tablet (0.1 mg total) by mouth daily as needed (for HTN, for SBP >175 or BDP>100). 30 tablet 1  . doxycycline (VIBRAMYCIN) 100 MG capsule Take 100 mg by mouth daily as needed (rosacea).     Marland Kitchen ibuprofen (ADVIL,MOTRIN) 200 MG tablet Take 200-400 mg by mouth every 6 (six) hours as needed for mild pain (depends on pain level if takes 1-2 tablets).    . Ivermectin 1 % CREA Apply 1 application topically daily as needed.    . metoprolol succinate (TOPROL-XL) 100 MG 24 hr tablet TAKE 1 TABLET BY MOUTH ONCE DAILY WITH OR IMMEDIATELY FOLLLOWING A MEAL 90 tablet 3  . ondansetron (ZOFRAN ODT) 4 MG disintegrating tablet Take 1 tablet (4 mg total) by mouth every 8 (eight) hours as needed for nausea or vomiting. 20 tablet 0  . Pediatric Multivit-Minerals-C (RA GUMMY VITAMINS & MINERALS) CHEW Chew 2 tablets by mouth daily. Women's gummies    . propranolol (INDERAL) 20 MG tablet Take 1 tablet (20 mg total) by mouth 2 (two) times daily as needed (tachycardia for HR>120). 60 tablet 1  . sertraline (ZOLOFT) 100 MG tablet Take 0.5  tablets (50 mg total) by mouth daily. 45 tablet 3   No current facility-administered medications on file prior to visit.     Allergies  Allergen Reactions  . Biaxin [Clarithromycin] Hives  . Toradol [Ketorolac Tromethamine]     Chest pain   . Prednisone Anxiety and Palpitations    "Makes me feel crazy"  . Tape Rash    Paper tape is fine    Past Medical History:  Diagnosis Date  . Anemia   . Anxiety state, unspecified   . 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 Clotting disorder ([place])   . GERD (gastroesophageal reflux disease)    OCC  . 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 Hypertension   . Multiple joint pain 09/04/2015  . Obesity   . Rosacea   . Undiagnosed cardiac murmurs   . Unspecified essential hypertension     Past Surgical History:  Procedure Laterality Date  . APPENDECTOMY  2009   [facility]  . CESAREAN SECTION  2016  . CHOLECYSTECTOMY N/A 03/20/2016   Procedure: LAPAROSCOPIC CHOLECYSTECTOMY;  Surgeon: Jules Husbands, MD;  Location: [facility] ORS;  Service: General;  Laterality: N/A;  . DILATION AND CURETTAGE OF UTERUS      Family History  Problem Relation Age of Onset  . Mitral valve prolapse Mother   . Arrhythmia Mother   . Hypertension Mother   . Diabetes Mother   .  Hypertension Father   . Hyperlipidemia Father   . Heart disease Maternal Grandfather   . Lung cancer Maternal Grandfather   . Cancer Maternal Grandfather   . Diabetes Brother   . Heart disease Maternal Aunt   . Diabetes Maternal Aunt   . Depression Maternal Aunt   . Cancer Maternal Grandmother        ovarian melanoma    Social History   Socioeconomic History  . Marital status: Married    Spouse name: Not on file  . Number of children: 2  . Years of education: Not on file  . Highest education level: Not on file  Occupational History  . Occupation: Agricultural engineer  Social Needs  . Financial resource strain: Not on file  . Food insecurity:    Worry: Not on  file    Inability: Not on file  . Transportation needs:    Medical: Not on file    Non-medical: Not on file  Tobacco Use  . Smoking status: Former Smoker    Packs/day: 0.25    Years: 1.00    Pack years: 0.25    Types: Cigarettes    Last attempt to quit: 03/13/1998    Years since quitting: 19.8  . Smokeless tobacco: Never Used  Substance and Sexual Activity  . Alcohol use: No  . Drug use: No  . Sexual activity: Not on file  Lifestyle  . Physical activity:    Days per week: Not on file    Minutes per session: Not on file  . Stress: Not on file  Relationships  . Social connections:    Talks on phone: Not on file    Gets together: Not on file    Attends religious service: Not on file    Active member of club or organization: Not on file    Attends meetings of clubs or organizations: Not on file    Relationship status: Not on file  . Intimate partner violence:    Fear of current or ex partner: Not on file    Emotionally abused: Not on file    Physically abused: Not on file    Forced sexual activity: Not on file  Other Topics Concern  . Not on file  Social History Narrative          Review of Systems Sleep is not great---restless Weight is the "most I have ever weighed" Used to walk some in mall--but stopped    Objective:   Physical Exam  Constitutional: She appears well-developed. No distress.  Neck: No thyromegaly present.  Cardiovascular: Normal rate, regular rhythm and normal heart sounds. Exam reveals no gallop.  No murmur heard. Respiratory: Effort normal and breath sounds normal. No respiratory distress. She has no wheezes. She has no rales.  Musculoskeletal:        General: No edema.  Lymphadenopathy:    She has no cervical adenopathy.  Psychiatric: She has a normal mood and affect. Her behavior is normal.           Assessment & Plan:

## 2017-12-26 NOTE — Assessment & Plan Note (Signed)
Will recheck fasting

## 2017-12-30 NOTE — Progress Notes (Signed)
No ans no vm   °

## 2017-12-31 LAB — METANEPHRINES, PLASMA
Metanephrine, Free: 29 pg/mL (ref <=57)
Normetanephrine, Free: 55 pg/mL (ref <=148)
TOTAL METANEPHRINES-PLASMA: 84 pg/mL (ref <=205)

## 2017-12-31 LAB — ACTH: C206 ACTH: 5 pg/mL — ABNORMAL LOW (ref 6–50)

## 2017-12-31 LAB — 17-HYDROXYPROGESTERONE: 17-OH-PROGESTERONE, LC/MS/MS: 20 ng/dL

## 2017-12-31 LAB — ANDROSTENEDIONE: Androstenedione: 46 ng/dL

## 2018-01-06 NOTE — Progress Notes (Signed)
Attempted to schedule ov.  Lm with husband on dpr to call office for an appt.

## 2018-01-19 ENCOUNTER — Encounter: Payer: Self-pay | Admitting: Cardiovascular Disease

## 2018-01-21 ENCOUNTER — Other Ambulatory Visit (HOSPITAL_COMMUNITY)
Admission: RE | Admit: 2018-01-21 | Discharge: 2018-01-21 | Disposition: A | Discharge status: Home / Self Care | Payer: BLUE CROSS/BLUE SHIELD | Source: Ambulatory Visit | Attending: Internal Medicine | Admitting: Internal Medicine

## 2018-01-21 ENCOUNTER — Encounter: Payer: Self-pay | Admitting: Internal Medicine

## 2018-01-21 ENCOUNTER — Ambulatory Visit (INDEPENDENT_AMBULATORY_CARE_PROVIDER_SITE_OTHER): Payer: BLUE CROSS/BLUE SHIELD | Admitting: Internal Medicine

## 2018-01-21 VITALS — BP 118/80 | HR 69 | Temp 98.3°F | Ht 62.0 in | Wt 190.0 lb

## 2018-01-21 DIAGNOSIS — F410 Panic disorder [episodic paroxysmal anxiety] without agoraphobia: Secondary | ICD-10-CM | POA: Diagnosis not present

## 2018-01-21 DIAGNOSIS — Z0000 Encounter for general adult medical examination without abnormal findings: Secondary | ICD-10-CM | POA: Insufficient documentation

## 2018-01-21 DIAGNOSIS — R0989 Other specified symptoms and signs involving the circulatory and respiratory systems: Secondary | ICD-10-CM

## 2018-01-21 DIAGNOSIS — D3500 Benign neoplasm of unspecified adrenal gland: Secondary | ICD-10-CM

## 2018-01-21 MED ORDER — SERTRALINE HCL 25 MG PO TABS: 25.0000 mg | ORAL_TABLET | Freq: Every day | ORAL | 1 refills | Status: DC

## 2018-01-21 NOTE — Assessment & Plan Note (Signed)
Testing negative Will consider rechecking in 6-12 months

## 2018-01-21 NOTE — Addendum Note (Signed)
Addended by: Pilar Grammes on: 01/21/2018 10:08 AM   Modules accepted: Orders

## 2018-01-21 NOTE — Assessment & Plan Note (Signed)
Mostly after miscarriages Will try to wean off the sertraline

## 2018-01-21 NOTE — Assessment & Plan Note (Signed)
Pap done Working on lifestyle May try to conceive again

## 2018-01-21 NOTE — Assessment & Plan Note (Addendum)
BP Readings from Last 3 Encounters:  01/21/18 118/80  12/26/17 124/86  12/19/17 130/72   Better now Could try off the metoprolol if conceives

## 2018-01-21 NOTE — Progress Notes (Signed)
Subjective:    Patient ID: Anita Massey, female    DOB: 03/27/1980, 38 y.o.   MRN: [id number]  HPI Here for physical  No recent symptoms Reviewed negative blood work for activity in the adrenal adenoma Periods are fairly regular--considering another child  No other problems  Current Outpatient Medications on File Prior to Visit  Medication Sig Dispense Refill  . aspirin-acetaminophen-caffeine (EXCEDRIN MIGRAINE) 250-250-65 MG tablet Take 1 tablet by mouth every 6 (six) hours as needed for headache.    . Calcium Carbonate-Simethicone (ALKA-SELTZER HEARTBURN + GAS PO) Take 1 tablet by mouth as needed.    . cloNIDine (CATAPRES) 0.1 MG tablet Take 1 tablet (0.1 mg total) by mouth daily as needed (for HTN, for SBP >175 or BDP>100). 30 tablet 1  . doxycycline (VIBRAMYCIN) 100 MG capsule Take 100 mg by mouth daily as needed (rosacea).     Marland Kitchen ibuprofen (ADVIL,MOTRIN) 200 MG tablet Take 200-400 mg by mouth every 6 (six) hours as needed for mild pain (depends on pain level if takes 1-2 tablets).    . Ivermectin 1 % CREA Apply 1 application topically daily as needed.    . metoprolol succinate (TOPROL-XL) 100 MG 24 hr tablet TAKE 1 TABLET BY MOUTH ONCE DAILY WITH OR IMMEDIATELY FOLLLOWING A MEAL 90 tablet 3  . ondansetron (ZOFRAN ODT) 4 MG disintegrating tablet Take 1 tablet (4 mg total) by mouth every 8 (eight) hours as needed for nausea or vomiting. 20 tablet 0  . Pediatric Multivit-Minerals-C (RA GUMMY VITAMINS & MINERALS) CHEW Chew 2 tablets by mouth daily. Women's gummies    . propranolol (INDERAL) 20 MG tablet Take 1 tablet (20 mg total) by mouth 2 (two) times daily as needed (tachycardia for HR>120). 60 tablet 1  . sertraline (ZOLOFT) 100 MG tablet Take 0.5 tablets (50 mg total) by mouth daily. 45 tablet 3   No current facility-administered medications on file prior to visit.     Allergies  Allergen Reactions  . Biaxin [Clarithromycin] Hives  . Toradol [Ketorolac Tromethamine]    Chest pain   . Prednisone Anxiety and Palpitations    "Makes me feel crazy"  . Tape Rash    Paper tape is fine    Past Medical History:  Diagnosis Date  . Anemia   . Anxiety state, unspecified   . Blood dyscrasia    PROTHROMBIN G 88416 HETEROZYGOSITY (FACTOR 2)  . Clotting disorder ([place])   . GERD (gastroesophageal reflux disease)    OCC  . 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 Hypertension   . Multiple joint pain 09/04/2015  . Obesity   . Rosacea   . Undiagnosed cardiac murmurs   . Unspecified essential hypertension     Past Surgical History:  Procedure Laterality Date  . APPENDECTOMY  2009   [facility]  . CESAREAN SECTION  2016  . CHOLECYSTECTOMY N/A 03/20/2016   Procedure: LAPAROSCOPIC CHOLECYSTECTOMY;  Surgeon: Jules Husbands, MD;  Location: [facility] ORS;  Service: General;  Laterality: N/A;  . DILATION AND CURETTAGE OF UTERUS      Family History  Problem Relation Age of Onset  . Mitral valve prolapse Mother   . Arrhythmia Mother   . Hypertension Mother   . Diabetes Mother   . Hypertension Father   . Hyperlipidemia Father   . Heart disease Maternal Grandfather   . Lung cancer Maternal Grandfather   . Cancer Maternal Grandfather   . Diabetes Brother   . Heart disease Maternal Aunt   .  Diabetes Maternal Aunt   . Depression Maternal Aunt   . Cancer Maternal Grandmother        ovarian melanoma    Social History   Socioeconomic History  . Marital status: Married    Spouse name: Not on file  . Number of children: 2  . Years of education: Not on file  . Highest education level: Not on file  Occupational History  . Occupation: Agricultural engineer  Social Needs  . Financial resource strain: Not on file  . Food insecurity:    Worry: Not on file    Inability: Not on file  . Transportation needs:    Medical: Not on file    Non-medical: Not on file  Tobacco Use  . Smoking status: Former Smoker    Packs/day: 0.25    Years: 1.00    Pack years: 0.25    Types: Cigarettes     Last attempt to quit: 03/13/1998    Years since quitting: 19.8  . Smokeless tobacco: Never Used  Substance and Sexual Activity  . Alcohol use: No  . Drug use: No  . Sexual activity: Not on file  Lifestyle  . Physical activity:    Days per week: Not on file    Minutes per session: Not on file  . Stress: Not on file  Relationships  . Social connections:    Talks on phone: Not on file    Gets together: Not on file    Attends religious service: Not on file    Active member of club or organization: Not on file    Attends meetings of clubs or organizations: Not on file    Relationship status: Not on file  . Intimate partner violence:    Fear of current or ex partner: Not on file    Emotionally abused: Not on file    Physically abused: Not on file    Forced sexual activity: Not on file  Other Topics Concern  . Not on file  Social History Narrative          Review of Systems  Constitutional: Negative for fatigue.       Has lost 7# just watching what she is eating Wears seat belt  HENT: Negative for hearing loss and tinnitus.        Overdue for dentist  Eyes: Negative for visual disturbance.       No diplopia or unilateral vision loss  Respiratory: Negative for cough, chest tightness and shortness of breath.   Cardiovascular: Negative for chest pain, palpitations and leg swelling.  Gastrointestinal: Negative for blood in stool.       Diarrhea persists but not as bad---hasn't used the medication No heartburn  Endocrine: Negative for polydipsia and polyuria.  Genitourinary: Positive for frequency. Negative for dyspareunia, dysuria and hematuria.       Libido is down  Musculoskeletal: Positive for back pain. Negative for joint swelling.  Skin:       Uses doxy for rosacea flares---hasn't needed lately Breaks out in axilla at times  Allergic/Immunologic: Positive for environmental allergies. Negative for immunocompromised state.       Uses allegra/flonase prn  Neurological:  Positive for dizziness and headaches. Negative for syncope.  Hematological: Negative for adenopathy. Does not bruise/bleed easily.  Psychiatric/Behavioral:       Stress with house, etc Still on sertraline 50 Chronic sleep problems       Objective:   Physical Exam  Constitutional: She is oriented to person, place, and time. She  appears well-developed. No distress.  HENT:  Head: Normocephalic and atraumatic.  Right Ear: External ear normal.  Left Ear: External ear normal.  Mouth/Throat: Oropharynx is clear and moist. No oropharyngeal exudate.  Eyes: Pupils are equal, round, and reactive to light. Conjunctivae are normal.  Neck: No thyromegaly present.  Cardiovascular: Normal rate, regular rhythm, normal heart sounds and intact distal pulses. Exam reveals no gallop.  No murmur heard. Respiratory: Effort normal and breath sounds normal. No respiratory distress. She has no wheezes. She has no rales.  GI: Soft. There is no abdominal tenderness.  Genitourinary:    Genitourinary Comments: Normal introitus Cervix visualized Pap done   Musculoskeletal:        General: No tenderness or edema.  Lymphadenopathy:    She has no cervical adenopathy.  Neurological: She is alert and oriented to person, place, and time.  Skin: No rash noted. No erythema.  Psychiatric: She has a normal mood and affect. Her behavior is normal.           Assessment & Plan:

## 2018-01-21 NOTE — Patient Instructions (Signed)
Take the sertraline 25mg  daily for 3 weeks, then every other day for 2 weeks--then try stopping it. Let me know if you have any problems with this.

## 2018-01-23 LAB — CYTOLOGY - PAP
Diagnosis: NEGATIVE
HPV (WINDOPATH): NOT DETECTED

## 2018-02-20 ENCOUNTER — Encounter: Payer: Self-pay | Admitting: Emergency Medicine

## 2018-02-20 ENCOUNTER — Emergency Department
Admission: EM | Admit: 2018-02-20 | Discharge: 2018-02-20 | Disposition: A | Discharge status: Home / Self Care | Payer: BLUE CROSS/BLUE SHIELD | Attending: Emergency Medicine | Admitting: Emergency Medicine

## 2018-02-20 ENCOUNTER — Other Ambulatory Visit: Payer: Self-pay

## 2018-02-20 DIAGNOSIS — R51 Headache: Secondary | ICD-10-CM | POA: Diagnosis not present

## 2018-02-20 DIAGNOSIS — Z5321 Procedure and treatment not carried out due to patient leaving prior to being seen by health care provider: Secondary | ICD-10-CM | POA: Insufficient documentation

## 2018-02-20 LAB — INFLUENZA PANEL BY PCR (TYPE A & B)
Influenza A By PCR: POSITIVE — AB
Influenza B By PCR: NEGATIVE

## 2018-02-20 NOTE — ED Triage Notes (Signed)
Pt to triage via w/c with no distress noted, mask in place;EMS reports pulled over by the police and reported she was enroute to the hospital; EMS was called for transport for c/o flu-like symptoms; pt reports since yesterday having body aches, generalized HA, and chills

## 2018-02-20 NOTE — ED Notes (Signed)
Pt st she is leaving due to wait; instr to return for any new or worsening symptoms

## 2018-02-24 ENCOUNTER — Telehealth: Payer: Self-pay

## 2018-02-24 NOTE — Telephone Encounter (Signed)
Pt was at [facility] ED on 02/20/18 and had positive flu test; on 02/23/18 was seen at [facility] and dx with bronchitis and gave abx and inhaler and mucinex.  Today earlier BP 130/88 and P 65; and now BP 130/80 something and P 71. Pt has taken metoprolol today around 1:30 PM. Pt wants to know if should be concerned about a slower heart rate. Has slight SOB now but no CP and feeling weak but pt said that could be due to having bronchitis. Pt did not want to schedule appt but request cb about lower heart rate. Dr Silvio Pate out of office this afternoon and will send to a provider in house.

## 2018-02-24 NOTE — Telephone Encounter (Signed)
Her heart rate is fine. Only concerned if sustained < 50.

## 2018-02-24 NOTE — Telephone Encounter (Signed)
Spoke to pt

## 2018-03-25 ENCOUNTER — Telehealth: Payer: Self-pay | Admitting: Internal Medicine

## 2018-03-25 MED ORDER — LABETALOL HCL 100 MG PO TABS: 100.0000 mg | ORAL_TABLET | Freq: Two times a day (BID) | ORAL | 1 refills | Status: DC

## 2018-03-25 NOTE — Telephone Encounter (Signed)
Okay to switch her as noted #60 x 1 Probably should get ongoing Rx from her Ob

## 2018-03-25 NOTE — Telephone Encounter (Signed)
Patient just found out that she's pregnant.  She takes Metoprolol. She said she usually is taken off of the Metoprolol while she's pregnant and switched to Labetalol 100 mg 2 x a day.  Patient tried not taking the Metoprolol this morning and her bp went up to 156/105.  Patient just took a Metoprolol.  Patient would like to switch the medications as soon as possible.  Patient uses [facility]

## 2018-03-25 NOTE — Telephone Encounter (Signed)
Spoke to pt. Sent rx. 

## 2018-03-27 ENCOUNTER — Telehealth: Payer: Self-pay | Admitting: Obstetrics and Gynecology

## 2018-03-27 ENCOUNTER — Encounter: Payer: Self-pay | Admitting: Emergency Medicine

## 2018-03-27 ENCOUNTER — Emergency Department
Admission: EM | Admit: 2018-03-27 | Discharge: 2018-03-27 | Disposition: A | Discharge status: Home / Self Care | Payer: BLUE CROSS/BLUE SHIELD | Attending: Student in an Organized Health Care Education/Training Program | Admitting: Student in an Organized Health Care Education/Training Program

## 2018-03-27 ENCOUNTER — Telehealth: Payer: Self-pay

## 2018-03-27 ENCOUNTER — Other Ambulatory Visit: Payer: Self-pay

## 2018-03-27 ENCOUNTER — Emergency Department: Payer: BLUE CROSS/BLUE SHIELD

## 2018-03-27 DIAGNOSIS — E039 Hypothyroidism, unspecified: Secondary | ICD-10-CM | POA: Diagnosis not present

## 2018-03-27 DIAGNOSIS — Z79899 Other long term (current) drug therapy: Secondary | ICD-10-CM | POA: Insufficient documentation

## 2018-03-27 DIAGNOSIS — O99511 Diseases of the respiratory system complicating pregnancy, first trimester: Secondary | ICD-10-CM | POA: Diagnosis present

## 2018-03-27 DIAGNOSIS — Z3A01 Less than 8 weeks gestation of pregnancy: Secondary | ICD-10-CM | POA: Diagnosis not present

## 2018-03-27 DIAGNOSIS — Z87891 Personal history of nicotine dependence: Secondary | ICD-10-CM | POA: Diagnosis not present

## 2018-03-27 DIAGNOSIS — R002 Palpitations: Secondary | ICD-10-CM

## 2018-03-27 DIAGNOSIS — O10011 Pre-existing essential hypertension complicating pregnancy, first trimester: Secondary | ICD-10-CM | POA: Insufficient documentation

## 2018-03-27 DIAGNOSIS — O99280 Endocrine, nutritional and metabolic diseases complicating pregnancy, unspecified trimester: Secondary | ICD-10-CM | POA: Diagnosis not present

## 2018-03-27 LAB — CBC WITH DIFFERENTIAL/PLATELET
Abs Immature Granulocytes: 0.08 10*3/uL — ABNORMAL HIGH (ref 0.00–0.07)
Basophils Absolute: 0.1 10*3/uL (ref 0.0–0.1)
Basophils Relative: 0 %
EOS ABS: 0.1 10*3/uL (ref 0.0–0.5)
EOS PCT: 1 %
HEMATOCRIT: 37.7 % (ref 36.0–46.0)
Hemoglobin: 12.7 g/dL (ref 12.0–15.0)
Immature Granulocytes: 1 %
LYMPHS ABS: 3.3 10*3/uL (ref 0.7–4.0)
LYMPHS PCT: 27 %
MCH: 29.7 pg (ref 26.0–34.0)
MCHC: 33.7 g/dL (ref 30.0–36.0)
MCV: 88.3 fL (ref 80.0–100.0)
Monocytes Absolute: 0.8 10*3/uL (ref 0.1–1.0)
Monocytes Relative: 7 %
Neutro Abs: 8 10*3/uL — ABNORMAL HIGH (ref 1.7–7.7)
Neutrophils Relative %: 64 %
Platelets: 303 10*3/uL (ref 150–400)
RBC: 4.27 MIL/uL (ref 3.87–5.11)
RDW: 13.6 % (ref 11.5–15.5)
WBC: 12.4 10*3/uL — ABNORMAL HIGH (ref 4.0–10.5)
nRBC: 0 % (ref 0.0–0.2)

## 2018-03-27 LAB — COMPREHENSIVE METABOLIC PANEL
ALBUMIN: 3.8 g/dL (ref 3.5–5.0)
ALT: 21 U/L (ref 0–44)
AST: 21 U/L (ref 15–41)
Alkaline Phosphatase: 88 U/L (ref 38–126)
Anion gap: 9 (ref 5–15)
BUN: 10 mg/dL (ref 6–20)
CO2: 21 mmol/L — ABNORMAL LOW (ref 22–32)
Calcium: 9.4 mg/dL (ref 8.9–10.3)
Chloride: 109 mmol/L (ref 98–111)
Creatinine, Ser: 0.61 mg/dL (ref 0.44–1.00)
GFR calc Af Amer: >60 mL/min (ref >60)
GFR calc non Af Amer: >60 mL/min (ref >60)
Glucose, Bld: 177 mg/dL — ABNORMAL HIGH (ref 70–99)
POTASSIUM: 3.1 mmol/L — AB (ref 3.5–5.1)
Sodium: 139 mmol/L (ref 135–145)
Total Bilirubin: 0.6 mg/dL (ref 0.3–1.2)
Total Protein: 6.9 g/dL (ref 6.5–8.1)

## 2018-03-27 LAB — TROPONIN I: Troponin I: <0.03 ng/mL (ref <0.03)

## 2018-03-27 LAB — TSH: TSH: 4.832 u[IU]/mL — ABNORMAL HIGH (ref 0.350–4.500)

## 2018-03-27 LAB — T4, FREE: Free T4: 0.76 ng/dL — ABNORMAL LOW (ref 0.82–1.77)

## 2018-03-27 LAB — FIBRIN DERIVATIVES D-DIMER (ARMC ONLY): Fibrin derivatives D-dimer (ARMC): 158.39 ng/mL (FEU) (ref 0.00–499.00)

## 2018-03-27 MED ORDER — LEVOTHYROXINE SODIUM 50 MCG PO TABS
25.0000 ug | ORAL_TABLET | Freq: Every day | ORAL | Status: DC
  Administered 2018-03-27: 25 ug via ORAL
  Filled 2018-03-27: qty 1

## 2018-03-27 MED ORDER — SODIUM CHLORIDE 0.9 % IV BOLUS
1000.0000 mL | Freq: Once | INTRAVENOUS | Status: AC
  Administered 2018-03-27: 1000 mL via INTRAVENOUS

## 2018-03-27 MED ORDER — FLUCONAZOLE 50 MG PO TABS: 150.0000 mg | ORAL_TABLET | Freq: Once | ORAL | Status: DC

## 2018-03-27 MED ORDER — CLOTRIMAZOLE 1 % VA CREA: 1.0000 | TOPICAL_CREAM | Freq: Every day | VAGINAL | 0 refills | Status: AC

## 2018-03-27 MED ORDER — LEVOTHYROXINE SODIUM 50 MCG PO TABS: 50.0000 ug | ORAL_TABLET | Freq: Every day | ORAL | 0 refills | Status: DC

## 2018-03-27 NOTE — Telephone Encounter (Signed)
Anita Massey said she was going to ask Anita Massey to call her as we have been non stop busy this AM and there are a few CMA's that do not have providers this morning that could help. Please advise if you spoke with patient

## 2018-03-27 NOTE — ED Triage Notes (Addendum)
Patient ambulatory to triage with steady gait, without difficulty or distress noted; pt reports awoke PTA with heart racing accomp by nausea; denies pain, denies hx of same; pt st approx [redacted]wks pregnant and hx clotting disorder

## 2018-03-27 NOTE — ED Provider Notes (Signed)
[facility] Emergency Department Provider Note    First MD Initiated Contact with Patient 03/27/18 0121     (approximate)  I have reviewed the triage vital signs and the nursing notes.   HISTORY  Chief Complaint Tachycardia    HPI Anita Massey is a 38 y.o. female roughly [redacted] weeks pregnant presents the ER with palpitations shortness of breath and nausea that awoke her from sleep.  Patient does have a known clotting disorder.  Was previously on anticoagulation during previous pregnancies.    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)   Clotting disorder (HCC)    GERD (gastroesophageal reflux disease)    OCC   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HCC)    Hypertension    Multiple joint pain 09/04/2015   Obesity    Rosacea    Undiagnosed cardiac murmurs    Unspecified essential hypertension    Family History  Problem Relation Age of Onset   Mitral valve prolapse Mother    Arrhythmia Mother    Hypertension Mother    Diabetes Mother    Hypertension Father    Hyperlipidemia Father    Heart disease Maternal Grandfather    Lung cancer Maternal Grandfather    Cancer Maternal Grandfather    Diabetes Brother    Heart disease Maternal Aunt    Diabetes Maternal Aunt    Depression Maternal Aunt    Cancer Maternal Grandmother        ovarian melanoma   Past Surgical History:  Procedure Laterality Date   APPENDECTOMY  2009   [facility]   CESAREAN SECTION  2016   CHOLECYSTECTOMY N/A 03/20/2016   Procedure: LAPAROSCOPIC CHOLECYSTECTOMY;  Surgeon: Jules Husbands, MD;  Location: [facility] ORS;  Service: General;  Laterality: N/A;   DILATION AND CURETTAGE OF UTERUS     Patient Active Problem List   Diagnosis Date Noted   Preventative health care 01/21/2018   IBS (irritable bowel syndrome) 09/19/2017   Asthmatic bronchitis  11/08/2016   Adrenal adenoma 09/11/2016   Panic anxiety syndrome 07/23/2016   Allergic urticaria    Rosacea 08/15/2015   Labile hypertension 03/01/2013   Palpitations 04/09/2011      Prior to Admission medications   Medication Sig Start Date End Date Taking? Authorizing Provider  aspirin-acetaminophen-caffeine (EXCEDRIN MIGRAINE) [phone] MG tablet Take 1 tablet by mouth every 6 (six) hours as needed for headache.    [provider]  Calcium Carbonate-Simethicone (ALKA-SELTZER HEARTBURN + GAS PO) Take 1 tablet by mouth as needed.    [provider]  doxycycline (VIBRAMYCIN) 100 MG capsule Take 100 mg by mouth daily as needed (rosacea).     [provider]  ibuprofen (ADVIL,MOTRIN) 200 MG tablet Take 200-400 mg by mouth every 6 (six) hours as needed for mild pain (depends on pain level if takes 1-2 tablets).    [provider]  Ivermectin 1 % CREA Apply 1 application topically daily as needed.    [provider]  labetalol (NORMODYNE) 100 MG tablet Take 1 tablet (100 mg total) by mouth 2 (two) times daily. 03/25/18   Venia Carbon, MD  ondansetron (ZOFRAN ODT) 4 MG disintegrating tablet Take 1 tablet (4 mg total) by mouth every 8 (eight) hours as needed for nausea or vomiting. 09/19/17   Venia Carbon, MD  Pediatric Multivit-Minerals-C (RA GUMMY VITAMINS &  MINERALS) CHEW Chew 2 tablets by mouth daily. Women's gummies    [provider]  propranolol (INDERAL) 20 MG tablet Take 1 tablet (20 mg total) by mouth 2 (two) times daily as needed (tachycardia for HR>120). 12/19/17   Gladstone Lighter, MD  sertraline (ZOLOFT) 25 MG tablet Take 1 tablet (25 mg total) by mouth daily. 01/21/18   Venia Carbon, MD    Allergies Biaxin [clarithromycin]; Toradol [ketorolac tromethamine]; Prednisone; Morphine and related; and Tape    Social History Social History   Tobacco Use   Smoking status: Former Smoker    Packs/day: 0.25     Years: 1.00    Pack years: 0.25    Types: Cigarettes    Last attempt to quit: 03/13/1998    Years since quitting: 20.0   Smokeless tobacco: Never Used  Substance Use Topics   Alcohol use: No   Drug use: No    Review of Systems Patient denies headaches, rhinorrhea, blurry vision, numbness, shortness of breath, chest pain, edema, cough, abdominal pain, nausea, vomiting, diarrhea, dysuria, fevers, rashes or hallucinations unless otherwise stated above in HPI. ____________________________________________   PHYSICAL EXAM:  VITAL SIGNS: Vitals:   03/27/18 0056  BP: (!) 148/81  Pulse: (!) 120  Resp: 18  Temp: 98.1 F (36.7 C)  SpO2: 99%    Constitutional: Alert and oriented.  Eyes: Conjunctivae are normal.  Head: Atraumatic. Nose: No congestion/rhinnorhea. Mouth/Throat: Mucous membranes are moist.   Neck: No stridor. Painless ROM.  Cardiovascular:tachycardia, regular rhythm. Grossly normal heart sounds.  Good peripheral circulation. Respiratory: Normal respiratory effort.  No retractions. Lungs CTAB. Gastrointestinal: Soft and nontender. No distention. No abdominal bruits. No CVA tenderness. Genitourinary:  Musculoskeletal: No lower extremity tenderness nor edema.  No joint effusions. Neurologic:  Normal speech and language. No gross focal neurologic deficits are appreciated. No facial droop Skin:  Skin is warm, dry and intact. No rash noted. Psychiatric: Mood and affect are normal. Speech and behavior are normal.  ____________________________________________   LABS (all labs ordered are listed, but only abnormal results are displayed)  Results for orders placed or performed during the hospital encounter of 03/27/18 (from the past 24 hour(s))  CBC with Differential     Status: Abnormal   Collection Time: 03/27/18  1:02 AM  Result Value Ref Range   WBC 12.4 (H) 4.0 - 10.5 K/uL   RBC 4.27 3.87 - 5.11 MIL/uL   Hemoglobin 12.7 12.0 - 15.0 g/dL   HCT 37.7 36.0 - 46.0 %     MCV 88.3 80.0 - 100.0 fL   MCH 29.7 26.0 - 34.0 pg   MCHC 33.7 30.0 - 36.0 g/dL   RDW 13.6 11.5 - 15.5 %   Platelets 303 150 - 400 K/uL   nRBC 0.0 0.0 - 0.2 %   Neutrophils Relative % 64 %   Neutro Abs 8.0 (H) 1.7 - 7.7 K/uL   Lymphocytes Relative 27 %   Lymphs Abs 3.3 0.7 - 4.0 K/uL   Monocytes Relative 7 %   Monocytes Absolute 0.8 0.1 - 1.0 K/uL   Eosinophils Relative 1 %   Eosinophils Absolute 0.1 0.0 - 0.5 K/uL   Basophils Relative 0 %   Basophils Absolute 0.1 0.0 - 0.1 K/uL   Immature Granulocytes 1 %   Abs Immature Granulocytes 0.08 (H) 0.00 - 0.07 K/uL   ____________________________________________  EKG My review and personal interpretation at Time: 1:00   Indication: tachycardia  Rate: 120  Rhythm: sinus tachycardia Axis: normal Other:  inferolateral st and t wave abn consistent with previous ecg ____________________________________________  RADIOLOGY  I personally reviewed all radiographic images ordered to evaluate for the above acute complaints and reviewed radiology reports and findings.  These findings were personally discussed with the patient.  Please see medical record for radiology report.  ____________________________________________   PROCEDURES  Procedure(s) performed:  Procedures    Critical Care performed: no ____________________________________________   INITIAL IMPRESSION / ASSESSMENT AND PLAN / ED COURSE  Pertinent labs & imaging results that were available during my care of the patient were reviewed by me and considered in my medical decision making (see chart for details).   DDX: Dysrhythmia, dehydration, pneumonia, bronchitis, PE, ACS, hyperthyroid  GWENYTH DINGEE is a 38 y.o. who presents to the ED with symptoms as described above.  Patient arrives pleasant, afebrile, tachycardic but without any respiratory distress or hypoxia.  Does not have any clinical signs or symptoms of PE or DVT.  She also does not have a previously diagnosed  DVT or PE.  Not currently on any anticoagulation.  Based on these findings I do believe she is low risk by Wells criteria therefore will further re-stratify for PE DVT with d-dimer.  Will provide IV fluids check blood work.  EKG has nonspecific changes that are consistent with previous.  Clinical Course as of Mar 26 412  Fri Mar 27, 2018  0330 Patient's d-dimer is negative.  She does not have any hypoxia.  EKG is unchanged from previous.  Does have a long history of tachycardia.  Have a low suspicion of PE at this point.  Even her history I will consult with her OB/GYN regarding recommendations for treatment of her hypothyroidism   [PR]  [phone] Discussed case with Dr. Herbert Spires with OB/GYN.  Patient considered low risk by a cog guidelines.  No indication for anticoagulation.  Will start her on low-dose Synthroid.   [PR]  0408 Patient reassessed.  Heart rate down to 105.  She is not hypoxic.  Will give dose of Synthroid and prescribe her for low-dose Synthroid for outpatient follow-up.  Patient states she is also got a yeast infection would like to medication for that.   [PR]    Clinical Course User Index [PR] Merlyn Lot, MD     As part of my medical decision making, I reviewed the following data within the [place] notes reviewed and incorporated, Labs reviewed, notes from prior ED visits and [place] Controlled Substance Database   ____________________________________________   FINAL CLINICAL IMPRESSION(S) / ED DIAGNOSES  Final diagnoses:  Palpitations  Hypothyroidism, unspecified type      NEW MEDICATIONS STARTED DURING THIS VISIT:  New Prescriptions   No medications on file     Note:  This document was prepared using Dragon voice recognition software and may include unintentional dictation errors.    Merlyn Lot, MD 03/27/18 [phone]

## 2018-03-27 NOTE — Telephone Encounter (Signed)
Pt is upset/irritated that nothing is being done for her tachycardia, low thyroid which can cause birth defects and miscarriages, hx of miscarriages, no hormone levels checked, no u/s done.  Pt has a clotting disorder. She was on anticoagulation during previous pregnancies.  I tried to explain to pt that u/s wasn't done b/c too early, would not be able to tell anything.  Pt states AMS did u/s throughout last pregnancy.  I tried to tell her that her heart rate was normal in ED of 120.  She states she saw the monitor say 135.  Adv pt per SDJ she is okay to wait for appt on Tuesday.  Also adv we had no appts available today. Pt aware will send this msg to SDJ.  Pt can be reached at [phone] Pt aware rxs at pharm for thyroid and yeast inf.

## 2018-03-27 NOTE — Telephone Encounter (Signed)
Patient is calling due to last night going to [facility] ER. Patient has questions and concerns about her visit and is having severe anxiety  about it. Patient is schedule with AMS on Tuesday, 03/31/18. Patient would like someone to review her visit and explain what is going on. And is she needs to be seen before Tuesday. Please advise

## 2018-03-27 NOTE — Telephone Encounter (Signed)
Discussed clinical course with patient.  Unless she is having symptoms of ongoing elevated heart rate, she could wait until her appt with Dr. Georgianne Fick. If her symptoms return or get worse, she needs to go to the ER.

## 2018-03-27 NOTE — ED Notes (Signed)
ED Provider at bedside. 

## 2018-03-27 NOTE — Telephone Encounter (Signed)
I have spoken c pt and sent msg to SDJ.  I did not realize this note was started.

## 2018-03-30 ENCOUNTER — Telehealth: Payer: Self-pay

## 2018-03-30 NOTE — Telephone Encounter (Signed)
Spoke to pt. She said she is doing better after ER visit for elevated heart rate. She said she is doing a little better. Has an OV with OB tomorrow.

## 2018-03-31 ENCOUNTER — Encounter: Payer: Self-pay | Admitting: *Deleted

## 2018-03-31 ENCOUNTER — Other Ambulatory Visit (HOSPITAL_COMMUNITY)
Admission: RE | Admit: 2018-03-31 | Discharge: 2018-03-31 | Disposition: A | Discharge status: Home / Self Care | Payer: BLUE CROSS/BLUE SHIELD | Source: Ambulatory Visit | Attending: Obstetrics and Gynecology | Admitting: Obstetrics and Gynecology

## 2018-03-31 ENCOUNTER — Other Ambulatory Visit: Payer: Self-pay

## 2018-03-31 ENCOUNTER — Telehealth: Payer: Self-pay

## 2018-03-31 ENCOUNTER — Encounter: Payer: Self-pay | Admitting: Obstetrics and Gynecology

## 2018-03-31 ENCOUNTER — Emergency Department: Payer: BLUE CROSS/BLUE SHIELD

## 2018-03-31 ENCOUNTER — Ambulatory Visit (INDEPENDENT_AMBULATORY_CARE_PROVIDER_SITE_OTHER): Payer: BLUE CROSS/BLUE SHIELD | Admitting: Obstetrics and Gynecology

## 2018-03-31 ENCOUNTER — Emergency Department
Admission: EM | Admit: 2018-03-31 | Discharge: 2018-03-31 | Disposition: A | Discharge status: Home / Self Care | Payer: BLUE CROSS/BLUE SHIELD | Attending: Emergency Medicine | Admitting: Emergency Medicine

## 2018-03-31 VITALS — BP 132/80 | HR 78 | Wt 197.0 lb

## 2018-03-31 DIAGNOSIS — Z87891 Personal history of nicotine dependence: Secondary | ICD-10-CM | POA: Diagnosis not present

## 2018-03-31 DIAGNOSIS — Z113 Encounter for screening for infections with a predominantly sexual mode of transmission: Secondary | ICD-10-CM | POA: Diagnosis present

## 2018-03-31 DIAGNOSIS — O0990 Supervision of high risk pregnancy, unspecified, unspecified trimester: Secondary | ICD-10-CM

## 2018-03-31 DIAGNOSIS — O26899 Other specified pregnancy related conditions, unspecified trimester: Secondary | ICD-10-CM | POA: Insufficient documentation

## 2018-03-31 DIAGNOSIS — O10919 Unspecified pre-existing hypertension complicating pregnancy, unspecified trimester: Secondary | ICD-10-CM

## 2018-03-31 DIAGNOSIS — R109 Unspecified abdominal pain: Secondary | ICD-10-CM

## 2018-03-31 DIAGNOSIS — O99280 Endocrine, nutritional and metabolic diseases complicating pregnancy, unspecified trimester: Secondary | ICD-10-CM

## 2018-03-31 DIAGNOSIS — O99281 Endocrine, nutritional and metabolic diseases complicating pregnancy, first trimester: Secondary | ICD-10-CM

## 2018-03-31 DIAGNOSIS — Z3A01 Less than 8 weeks gestation of pregnancy: Secondary | ICD-10-CM

## 2018-03-31 DIAGNOSIS — O10011 Pre-existing essential hypertension complicating pregnancy, first trimester: Secondary | ICD-10-CM | POA: Insufficient documentation

## 2018-03-31 DIAGNOSIS — Z1589 Genetic susceptibility to other disease: Secondary | ICD-10-CM | POA: Insufficient documentation

## 2018-03-31 DIAGNOSIS — E039 Hypothyroidism, unspecified: Secondary | ICD-10-CM

## 2018-03-31 DIAGNOSIS — Z3689 Encounter for other specified antenatal screening: Secondary | ICD-10-CM

## 2018-03-31 DIAGNOSIS — Z3201 Encounter for pregnancy test, result positive: Secondary | ICD-10-CM | POA: Diagnosis not present

## 2018-03-31 DIAGNOSIS — R102 Pelvic and perineal pain: Secondary | ICD-10-CM | POA: Insufficient documentation

## 2018-03-31 DIAGNOSIS — E7212 Methylenetetrahydrofolate reductase deficiency: Secondary | ICD-10-CM

## 2018-03-31 DIAGNOSIS — D682 Hereditary deficiency of other clotting factors: Secondary | ICD-10-CM

## 2018-03-31 DIAGNOSIS — O09529 Supervision of elderly multigravida, unspecified trimester: Secondary | ICD-10-CM | POA: Insufficient documentation

## 2018-03-31 DIAGNOSIS — N912 Amenorrhea, unspecified: Secondary | ICD-10-CM

## 2018-03-31 DIAGNOSIS — O09291 Supervision of pregnancy with other poor reproductive or obstetric history, first trimester: Secondary | ICD-10-CM

## 2018-03-31 DIAGNOSIS — D6852 Prothrombin gene mutation: Secondary | ICD-10-CM | POA: Insufficient documentation

## 2018-03-31 DIAGNOSIS — O09299 Supervision of pregnancy with other poor reproductive or obstetric history, unspecified trimester: Secondary | ICD-10-CM | POA: Insufficient documentation

## 2018-03-31 DIAGNOSIS — O10911 Unspecified pre-existing hypertension complicating pregnancy, first trimester: Secondary | ICD-10-CM

## 2018-03-31 DIAGNOSIS — O09521 Supervision of elderly multigravida, first trimester: Secondary | ICD-10-CM

## 2018-03-31 LAB — CBC
HCT: 38.2 % (ref 36.0–46.0)
Hemoglobin: 12.6 g/dL (ref 12.0–15.0)
MCH: 29.7 pg (ref 26.0–34.0)
MCHC: 33 g/dL (ref 30.0–36.0)
MCV: 90.1 fL (ref 80.0–100.0)
Platelets: 326 10*3/uL (ref 150–400)
RBC: 4.24 MIL/uL (ref 3.87–5.11)
RDW: 14.4 % (ref 11.5–15.5)
WBC: 11 10*3/uL — ABNORMAL HIGH (ref 4.0–10.5)
nRBC: 0 % (ref 0.0–0.2)

## 2018-03-31 LAB — URINALYSIS, COMPLETE (UACMP) WITH MICROSCOPIC
Bacteria, UA: NONE SEEN
Bilirubin Urine: NEGATIVE
Glucose, UA: NEGATIVE mg/dL
Ketones, ur: NEGATIVE mg/dL
Nitrite: NEGATIVE
Protein, ur: NEGATIVE mg/dL
Specific Gravity, Urine: 1.016 (ref 1.005–1.030)
pH: 5 (ref 5.0–8.0)

## 2018-03-31 LAB — COMPREHENSIVE METABOLIC PANEL WITH GFR
ALT: 23 U/L (ref 0–44)
AST: 25 U/L (ref 15–41)
Albumin: 3.8 g/dL (ref 3.5–5.0)
Alkaline Phosphatase: 72 U/L (ref 38–126)
Anion gap: 10 (ref 5–15)
BUN: 7 mg/dL (ref 6–20)
CO2: 23 mmol/L (ref 22–32)
Calcium: 9 mg/dL (ref 8.9–10.3)
Chloride: 108 mmol/L (ref 98–111)
Creatinine, Ser: 0.63 mg/dL (ref 0.44–1.00)
GFR calc Af Amer: >60 mL/min (ref >60)
GFR calc non Af Amer: >60 mL/min (ref >60)
Glucose, Bld: 112 mg/dL — ABNORMAL HIGH (ref 70–99)
Potassium: 3.4 mmol/L — ABNORMAL LOW (ref 3.5–5.1)
Sodium: 141 mmol/L (ref 135–145)
Total Bilirubin: 0.3 mg/dL (ref 0.3–1.2)
Total Protein: 6.8 g/dL (ref 6.5–8.1)

## 2018-03-31 LAB — HCG, QUANTITATIVE, PREGNANCY: hCG, Beta Chain, Quant, S: 8200 m[IU]/mL — ABNORMAL HIGH (ref <5)

## 2018-03-31 LAB — OB RESULTS CONSOLE VARICELLA ZOSTER ANTIBODY, IGG: Varicella: IMMUNE

## 2018-03-31 LAB — POCT URINE PREGNANCY: Preg Test, Ur: POSITIVE — AB

## 2018-03-31 LAB — POCT PREGNANCY, URINE: Preg Test, Ur: POSITIVE — AB

## 2018-03-31 MED ORDER — FOLIC ACID 1 MG PO TABS: 1.0000 mg | ORAL_TABLET | Freq: Every day | ORAL | 10 refills | Status: DC

## 2018-03-31 MED ORDER — LEVOTHYROXINE SODIUM 75 MCG PO TABS: 75.0000 ug | ORAL_TABLET | Freq: Every day | ORAL | 6 refills | Status: DC

## 2018-03-31 MED ORDER — ASPIRIN EC 81 MG PO TBEC: 81.0000 mg | DELAYED_RELEASE_TABLET | Freq: Every day | ORAL | 2 refills | Status: DC

## 2018-03-31 NOTE — Progress Notes (Signed)
New Obstetric Patient H&P    Chief Complaint: "Desires prenatal care"   History of Present Illness: Patient is a 38 y.o. [id number] Not Hispanic or Latino female, presents with amenorrhea and positive home pregnancy test. Patient's last menstrual period was 02/17/2018 (approximate). and based on her  LMP, her EDD is Estimated Date of Delivery: 11/24/18 and her EGA is [redacted]w[redacted]d. Cycles are regular monthly. Her last pap smear was 02/06/2010 no abnormalities.    She had a urine pregnancy test which was positive 2 weeks  ago. Her last menstrual period was normal. Since her LMP she claims she has experienced some fatigue, breast tenderness. She denies vaginal bleeding. Her past medical history is contibutory for AMA, hypothyroidism, essential hypertension, obsity, MTHFR mutation, Factor II deficiencey. Her prior pregnancies are notable for history of 3rd degree laceration in G1, with G2 1LCTS.  Since her LMP, she admits to the use of tobacco products  no There are cats in the home in the home  no She admits close contact with children on a regular basis  yes  She has had chicken pox in the past yes She has had Tuberculosis exposures, symptoms, or previously tested positive for TB   no Current or past history of domestic violence. no  Genetic Screening/Teratology Counseling: (Includes patient, baby's father, or anyone in either family with:)   20. Patient's age >/= 28 [facility]  yes 2. Thalassemia ([place], [place], [place], or Asian background): MCV<80  no 3. Neural tube defect (meningomyelocele, spina bifida, anencephaly)  no 4. Congenital heart defect  no  5. Down syndrome  no 6. Tay-Sachs (Jewish, [place])  no 7. Canavan's Disease  no 8. Sickle cell disease or trait (African)  no  9. Hemophilia or other blood disorders  no  10. Muscular dystrophy  no  11. Cystic fibrosis  no  12. Huntington's Chorea  no  13. Mental retardation/autism  no 14. Other inherited genetic or chromosomal  disorder  no 15. Maternal metabolic disorder (DM, PKU, etc)  no 16. Patient or FOB with a child with a birth defect not listed above no  16a. Patient or FOB with a birth defect themselves no 17. Recurrent pregnancy loss, or stillbirth  no  18. Any medications since LMP other than prenatal vitamins (include vitamins, supplements, OTC meds, drugs, alcohol)  no 19. Any other genetic/environmental exposure to discuss  no  Infection History:   1. Lives with someone with TB or TB exposed  no  2. Patient or partner has history of genital herpes  no 3. Rash or viral illness since LMP  no 4. History of STI (GC, CT, HPV, syphilis, HIV)  no 5. History of recent travel :  no  Other pertinent information:  no     Review of Systems:10 point review of systems negative unless otherwise noted in HPI  Past Medical History:  Past Medical History:  Diagnosis Date  . Anemia   . Anxiety state, unspecified   . Blood dyscrasia    PROTHROMBIN G 48250 HETEROZYGOSITY (FACTOR 2)  . Clotting disorder ([place])   . GERD (gastroesophageal reflux disease)    OCC  . 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[place])   . Hypertension   . Hypothyroidism   . Multiple joint pain 09/04/2015  . Obesity   . Rosacea   . Undiagnosed cardiac murmurs   . Unspecified essential hypertension     Past Surgical History:  Past Surgical History:  Procedure Laterality Date  . APPENDECTOMY  2009   [facility]  . CESAREAN SECTION  2016  . CHOLECYSTECTOMY N/A 03/20/2016   Procedure: LAPAROSCOPIC CHOLECYSTECTOMY;  Surgeon: Jules Husbands, MD;  Location: [facility] ORS;  Service: General;  Laterality: N/A;  . DILATION AND CURETTAGE OF UTERUS      Gynecologic History: Patient's last menstrual period was 02/17/2018 (approximate).  Obstetric History: [id number]  Family History:  Family History  Problem Relation Age of Onset  . Mitral valve prolapse Mother   . Arrhythmia Mother   . Hypertension Mother   . Diabetes Mother   . Hypertension Father    . Hyperlipidemia Father   . Heart disease Maternal Grandfather   . Lung cancer Maternal Grandfather   . Cancer Maternal Grandfather   . Diabetes Brother   . Heart disease Maternal Aunt   . Diabetes Maternal Aunt   . Depression Maternal Aunt   . Cancer Maternal Grandmother        ovarian melanoma    Social History:  Social History   Socioeconomic History  . Marital status: Married    Spouse name: Not on file  . Number of children: 2  . Years of education: Not on file  . Highest education level: Not on file  Occupational History  . Occupation: Agricultural engineer  Social Needs  . Financial resource strain: Not on file  . Food insecurity:    Worry: Not on file    Inability: Not on file  . Transportation needs:    Medical: Not on file    Non-medical: Not on file  Tobacco Use  . Smoking status: Former Smoker    Packs/day: 0.25    Years: 1.00    Pack years: 0.25    Types: Cigarettes    Last attempt to quit: 03/13/1998    Years since quitting: 20.0  . Smokeless tobacco: Never Used  Substance and Sexual Activity  . Alcohol use: No  . Drug use: No  . Sexual activity: Yes    Birth control/protection: None  Lifestyle  . Physical activity:    Days per week: Not on file    Minutes per session: Not on file  . Stress: Not on file  Relationships  . Social connections:    Talks on phone: Not on file    Gets together: Not on file    Attends religious service: Not on file    Active member of club or organization: Not on file    Attends meetings of clubs or organizations: Not on file    Relationship status: Not on file  . Intimate partner violence:    Fear of current or ex partner: Not on file    Emotionally abused: Not on file    Physically abused: Not on file    Forced sexual activity: Not on file  Other Topics Concern  . Not on file  Social History Narrative           Allergies:  Allergies  Allergen Reactions  . Biaxin [Clarithromycin] Hives  . Toradol [Ketorolac  Tromethamine]     Chest pain   . Prednisone Anxiety and Palpitations    "Makes me feel crazy"  . Morphine And Related     Chest Pain   . Tape Rash    Paper tape is fine    Medications: Prior to Admission medications   Medication Sig Start Date End Date Taking? Authorizing Provider  clotrimazole (GYNE-LOTRIMIN) 1 % vaginal cream Place 1 Applicatorful vaginally at bedtime for 5 days. 03/27/18 04/01/18 Yes Quentin Cornwall,  Saralyn Pilar, MD  labetalol (NORMODYNE) 100 MG tablet Take 1 tablet (100 mg total) by mouth 2 (two) times daily. 03/25/18  Yes Venia Carbon, MD  levothyroxine (SYNTHROID) 50 MCG tablet Take 1 tablet (50 mcg total) by mouth daily before breakfast. 03/27/18  Yes Merlyn Lot, MD  propranolol (INDERAL) 20 MG tablet Take 1 tablet (20 mg total) by mouth 2 (two) times daily as needed (tachycardia for HR>120). Patient not taking: Reported on 03/31/2018 12/19/17   Gladstone Lighter, MD  sertraline (ZOLOFT) 25 MG tablet Take 1 tablet (25 mg total) by mouth daily. Patient not taking: Reported on 03/31/2018 01/21/18   Venia Carbon, MD    Physical Exam Vitals: Blood pressure 132/80, pulse 78, weight 197 lb (89.4 kg), last menstrual period 02/17/2018.  General: NAD HEENT: normocephalic, anicteric Pulmonary: No increased work of breathing, CTAB Abdomen: soft, non-tender, non-distended.  Umbilicus without lesions.  No hepatomegaly, splenomegaly or masses palpable. No evidence of hernia  Genitourinary:  External: Normal external female genitalia.  Normal urethral meatus, normal  Bartholin's and Skene's glands.    Vagina: Normal vaginal mucosa, no evidence of prolapse.    Cervix: Grossly normal in appearance, no bleeding  Uterus:  Non-enlarged, mobile, normal contour.  No CMT  Adnexa: ovaries non-enlarged, no adnexal masses  Rectal: deferred Extremities: no edema, erythema, or tenderness Neurologic: Grossly intact Psychiatric: mood appropriate, affect full   Assessment: 38 y.o.  [id number] at [redacted]w[redacted]d presenting to initiate prenatal care  Plan: 1) Avoid alcoholic beverages. 2) Patient encouraged not to smoke.  3) Discontinue the use of all non-medicinal drugs and chemicals.  4) Take prenatal vitamins daily.  5) Nutrition, food safety (fish, cheese advisories, and high nitrite foods) and exercise discussed. 6) Hospital and practice style discussed with cross coverage system.  7) Genetic Screening, such as with 1st Trimester Screening, cell free fetal DNA, AFP testing, and Ultrasound, as well as with amniocentesis and CVS as appropriate, is discussed with patient. At the conclusion of today's visit patient requested genetic testing 8) Patient is asked about travel to areas at risk for the Zika virus, and counseled to avoid travel and exposure to mosquitoes or sexual partners who may have themselves been exposed to the virus. Testing is discussed, and will be ordered as appropriate.  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

## 2018-03-31 NOTE — Telephone Encounter (Signed)
Please call.

## 2018-03-31 NOTE — ED Provider Notes (Signed)
[facility] Emergency Department Provider Note   ____________________________________________   I have reviewed the triage vital signs and the nursing notes.   HISTORY  Chief Complaint Abdominal Pain   History limited by: Not Limited   HPI Anita Massey is a 38 y.o. female who presents to the emergency department today because of concern for right lower abdominal pain in the setting of early pregnancy. Patient states she is roughly [redacted] weeks pregnant by dates. Went to ob/gyn doctors appointment earlier today. Shortly after leaving she noticed right lower abdominal pain. States that it has been constant and worsening since it started. The patient has not noticed any vaginal bleeding. Does have a history of miscarriage in the past.   Records reviewed. Per medical record review patient has a history of clotting disorder.   Past Medical History:  Diagnosis Date  . Anemia   . Anxiety state, unspecified   . 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 Clotting disorder ([place])   . GERD (gastroesophageal reflux disease)    OCC  . 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 Hypertension   . Hypothyroidism   . Multiple joint pain 09/04/2015  . Obesity   . Rosacea   . Undiagnosed cardiac murmurs   . Unspecified essential hypertension     Patient Active Problem List   Diagnosis Date Noted  . 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 MTHFR gene mutation ([place]) 03/31/2018  . Chronic hypertension in pregnancy 03/31/2018  . History of maternal third degree perineal laceration, currently pregnant 03/31/2018  . Antepartum multigravida of advanced maternal age 39/17/2020  . Preventative health care 01/21/2018  . IBS (irritable bowel syndrome) 09/19/2017  . Asthmatic bronchitis 11/08/2016  . Adrenal adenoma 09/11/2016  . Panic anxiety syndrome 07/23/2016  . Allergic urticaria   . Rosacea 08/15/2015  . 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  Palpitations 04/09/2011    Past Surgical History:  Procedure Laterality Date  . APPENDECTOMY  2009   [facility]  . CESAREAN SECTION  2016  . CHOLECYSTECTOMY N/A 03/20/2016   Procedure: LAPAROSCOPIC CHOLECYSTECTOMY;  Surgeon: Jules Husbands, MD;  Location: [facility] ORS;  Service: General;  Laterality: N/A;  . DILATION AND CURETTAGE OF UTERUS      Prior to Admission medications   Medication Sig Start Date End Date Taking? Authorizing Provider  aspirin EC 81 MG tablet Take 1 tablet (81 mg total) by mouth daily. 03/31/18 03/31/19  Malachy Mood, MD  clotrimazole (GYNE-LOTRIMIN) 1 % vaginal cream Place 1 Applicatorful vaginally at bedtime for 5 days. 03/27/18 04/01/18  Merlyn Lot, MD  folic acid (FOLVITE) 1 MG tablet Take 1 tablet (1 mg total) by mouth daily. 03/31/18   Malachy Mood, MD  labetalol (NORMODYNE) 100 MG tablet Take 1 tablet (100 mg total) by mouth 2 (two) times daily. 03/25/18   Venia Carbon, MD  levothyroxine (SYNTHROID, LEVOTHROID) 75 MCG tablet Take 1 tablet (75 mcg total) by mouth daily before breakfast. 03/31/18   Malachy Mood, MD  propranolol (INDERAL) 20 MG tablet Take 1 tablet (20 mg total) by mouth 2 (two) times daily as needed (tachycardia for HR>120). Patient not taking: Reported on 03/31/2018 12/19/17   Gladstone Lighter, MD  sertraline (ZOLOFT) 25 MG tablet Take 1 tablet (25 mg total) by mouth daily. Patient not taking: Reported on 03/31/2018 01/21/18   Venia Carbon, MD    Allergies Biaxin [clarithromycin]; Toradol [ketorolac tromethamine]; Prednisone; Morphine and related; and  Tape  Family History  Problem Relation Age of Onset  . Mitral valve prolapse Mother   . Arrhythmia Mother   . Hypertension Mother   . Diabetes Mother   . Hypertension Father   . Hyperlipidemia Father   . Heart disease Maternal Grandfather   . Lung cancer Maternal Grandfather   . Cancer Maternal Grandfather   . Diabetes Brother   . Heart disease Maternal Aunt   . Diabetes  Maternal Aunt   . Depression Maternal Aunt   . Cancer Maternal Grandmother        ovarian melanoma    Social History Social History   Tobacco Use  . Smoking status: Former Smoker    Packs/day: 0.25    Years: 1.00    Pack years: 0.25    Types: Cigarettes    Last attempt to quit: 03/13/1998    Years since quitting: 20.0  . Smokeless tobacco: Never Used  Substance Use Topics  . Alcohol use: No  . Drug use: No    Review of Systems Constitutional: No fever/chills Eyes: No visual changes. ENT: No sore throat. Cardiovascular: Denies chest pain. Respiratory: Denies shortness of breath. Gastrointestinal: Positive for right lower quadrant pain.  Genitourinary: Negative for dysuria. Musculoskeletal: Negative for back pain. Skin: Negative for rash. Neurological: Negative for headaches, focal weakness or numbness.  ____________________________________________   PHYSICAL EXAM:  VITAL SIGNS: ED Triage Vitals  Enc Vitals Group     BP 03/31/18 1828 128/84     Pulse Rate 03/31/18 1828 97     Resp 03/31/18 1828 18     Temp 03/31/18 1828 98.5 F (36.9 C)     Temp Source 03/31/18 1828 Oral     SpO2 03/31/18 1828 99 %     Weight 03/31/18 1829 197 lb (89.4 kg)     Height 03/31/18 1829 5\' 2"  (1.575 m)     Head Circumference --      Peak Flow --      Pain Score 03/31/18 1829 7   Constitutional: Alert and oriented.  Eyes: Conjunctivae are normal.  ENT      Head: Normocephalic and atraumatic.      Nose: No congestion/rhinnorhea.      Mouth/Throat: Mucous membranes are moist.      Neck: No stridor. Hematological/Lymphatic/Immunilogical: No cervical lymphadenopathy. Cardiovascular: Normal rate, regular rhythm.  No murmurs, rubs, or gallops.  Respiratory: Normal respiratory effort without tachypnea nor retractions. Breath sounds are clear and equal bilaterally. No wheezes/rales/rhonchi. Gastrointestinal: Soft and non tender. No rebound. No guarding.  Genitourinary:  Deferred Musculoskeletal: Normal range of motion in all extremities. No lower extremity edema. Neurologic:  Normal speech and language. No gross focal neurologic deficits are appreciated.  Skin:  Skin is warm, dry and intact. No rash noted. Psychiatric: Mood and affect are normal. Speech and behavior are normal. Patient exhibits appropriate insight and judgment.  ____________________________________________    LABS (pertinent positives/negatives)  Upreg positive CMP wnl except k 3.4, glu 112 CBC wbc 11.0, hgb 12.6, plt 326 UA clear, moderate hgb dipstick, trace leukocytes, 11-20 rbc, 6-10 squamous  ____________________________________________   EKG  None  ____________________________________________    RADIOLOGY  [place] Intrauterine pregnancy with yolk sac. No fetal pole or cardiac activity. Right ovarian cyst.   ____________________________________________   PROCEDURES  Procedures  ____________________________________________   INITIAL IMPRESSION / ASSESSMENT AND PLAN / ED COURSE  Pertinent labs & imaging results that were available during my care of the patient were reviewed by me and  considered in my medical decision making (see chart for details).   Patient presented to the emergency department because of right lower quadrant pain in the setting of early pregnancy. Concern for ectopic. The patient had US performed which showed intrauterine pregnancy. Did show an ovarian cyst on the right side. Discussed US findings with patient. Discussed importance of continued prenatal vitamins and follow up with ob/gyn for both further pregnancy monitoring and reevaluation of ovarian cyst.    ____________________________________________   FINAL CLINICAL IMPRESSION(S) / ED DIAGNOSES  Final diagnoses:  Pelvic pain in pregnancy     Note: This dictation was prepared with Dragon dictation. Any transcriptional errors that result from this process are unintentional      Nance Pear, MD 03/31/18 2211

## 2018-03-31 NOTE — ED Notes (Signed)
Pt sitting up on stretcher. Tearful. States she is very anxious about her ultrasound results. Denies increase in pain at this time. Reassured. Provided for comfort and safety and will continue to assess.

## 2018-03-31 NOTE — ED Triage Notes (Signed)
Pt reports abd pain with cramping.  Pt reports approx [redacted] weeks pregnant.  Sx began today.  No urinary sx.  Recent yeast infection.  Pt alert.

## 2018-03-31 NOTE — Telephone Encounter (Signed)
Nothing to do different at this time no ectopic risk factors

## 2018-03-31 NOTE — Telephone Encounter (Signed)
Pt called c/o cramping on the right side after appt this am.  [phone]  Adv pt cramping is normal throughout pregnancy as long as it doesn't double her over or stop her in her tracks.  She says it's not that bad.  It's like menstrual cramps just on the right side.  Is concerned since she's had miscarriages. She has had no spotting or bleeding. Pt adv cramping should subside within 24hrs.  "Then what do I do if they don't."  Adv to schedule appt.  She wanted to know if I talked to [facility].  Adv per protocols.  Adv would send msg to [facility].

## 2018-03-31 NOTE — ED Notes (Addendum)
Dr. Archie Balboa at the bedside for pt evaluation. Bedside ultrasound performed. Pt resting on stretcher with no distress noted. Family at the bedside. Pt c/o sharp lower abd pain; abd slightly tender with palpation. Denies bleeding or discharge. approx [redacted] weeks pregnant. concerned due to hx of miscarriage.

## 2018-03-31 NOTE — Discharge Instructions (Addendum)
Please seek medical attention for any high fevers, chest pain, shortness of breath, change in behavior, persistent vomiting, bloody stool or any other new or concerning symptoms.  

## 2018-03-31 NOTE — ED Triage Notes (Signed)
Says around 6-[redacted] week pregnant and now having sharp cramping.  Says history of miscarriages.

## 2018-03-31 NOTE — Progress Notes (Signed)
NOB ER follow up/increased heart rate

## 2018-03-31 NOTE — ED Notes (Signed)
Pt returned from ultrasound

## 2018-03-31 NOTE — ED Notes (Signed)
ED Provider at bedside. 

## 2018-03-31 NOTE — ED Notes (Signed)
Pt to ultrasound

## 2018-04-01 ENCOUNTER — Telehealth: Payer: Self-pay

## 2018-04-01 DIAGNOSIS — O99280 Endocrine, nutritional and metabolic diseases complicating pregnancy, unspecified trimester: Secondary | ICD-10-CM

## 2018-04-01 DIAGNOSIS — E039 Hypothyroidism, unspecified: Secondary | ICD-10-CM | POA: Insufficient documentation

## 2018-04-01 DIAGNOSIS — O0990 Supervision of high risk pregnancy, unspecified, unspecified trimester: Secondary | ICD-10-CM | POA: Insufficient documentation

## 2018-04-01 LAB — RPR+RH+ABO+RUB AB+AB SCR+CB...
Antibody Screen: NEGATIVE
HEMATOCRIT: 37.5 % (ref 34.0–46.6)
HIV Screen 4th Generation wRfx: NONREACTIVE
Hemoglobin: 12.8 g/dL (ref 11.1–15.9)
Hepatitis B Surface Ag: NEGATIVE
MCH: 30.1 pg (ref 26.6–33.0)
MCHC: 34.1 g/dL (ref 31.5–35.7)
MCV: 88 fL (ref 79–97)
Platelets: 344 10*3/uL (ref 150–450)
RBC: 4.25 x10E6/uL (ref 3.77–5.28)
RDW: 13.5 % (ref 11.7–15.4)
RH TYPE: POSITIVE
RPR Ser Ql: NONREACTIVE
Rubella Antibodies, IGG: 5.31 index (ref >0.99)
Varicella zoster IgG: 400 index (ref >165)
WBC: 9.4 10*3/uL (ref 3.4–10.8)

## 2018-04-01 LAB — CERVICOVAGINAL ANCILLARY ONLY
Chlamydia: NEGATIVE
Neisseria Gonorrhea: NEGATIVE

## 2018-04-01 LAB — PROTEIN / CREATININE RATIO, URINE
Creatinine, Urine: 191.9 mg/dL
Protein, Ur: 19 mg/dL
Protein/Creat Ratio: 99 mg/g creat (ref 0–200)

## 2018-04-01 NOTE — Telephone Encounter (Signed)
advise

## 2018-04-01 NOTE — Telephone Encounter (Signed)
I spoke to pt and reassured her. Advised her to keep her appointment in 2 weeks for ultrasound

## 2018-04-01 NOTE — Telephone Encounter (Signed)
yes

## 2018-04-01 NOTE — Telephone Encounter (Signed)
Unable to reach patient through voicemail.

## 2018-04-01 NOTE — Telephone Encounter (Signed)
Pt wants to know if it's okay for her to take labetalol at the same time she takes her thyroid medicine.  She takes her PNV 4hrs after thyroid medicine.  [phone]

## 2018-04-01 NOTE — Telephone Encounter (Signed)
Mychart message sent to patient.

## 2018-04-01 NOTE — Telephone Encounter (Signed)
Spoke to pt to see how she is doing after her ER visit yesterday for ovarian cyst. She said she is still in some pain. They are just keeping an eye on her.

## 2018-04-02 LAB — URINE CULTURE

## 2018-04-03 ENCOUNTER — Other Ambulatory Visit: Payer: Self-pay | Admitting: Obstetrics and Gynecology

## 2018-04-03 MED ORDER — NITROFURANTOIN MONOHYD MACRO 100 MG PO CAPS: 100.0000 mg | ORAL_CAPSULE | Freq: Two times a day (BID) | ORAL | 1 refills | Status: DC

## 2018-04-06 ENCOUNTER — Other Ambulatory Visit: Payer: BLUE CROSS/BLUE SHIELD

## 2018-04-06 ENCOUNTER — Other Ambulatory Visit: Payer: Self-pay

## 2018-04-06 ENCOUNTER — Telehealth: Payer: Self-pay | Admitting: Obstetrics and Gynecology

## 2018-04-06 DIAGNOSIS — O0991 Supervision of high risk pregnancy, unspecified, first trimester: Secondary | ICD-10-CM

## 2018-04-06 NOTE — Telephone Encounter (Signed)
Spoke with pt. Suggested lotrimin crm over-the-counter BID for 2 wks, keep area dry. Pt also went to ED last wk for RLQ pain. Had u/s at [place] but didn't see cardiac activity; pt with 2.7 cm RTO complex cyst, beta hCG=8,200 . Pt told to get f/u hCG and u/s in 2 wks. Pt has appt 04/14/18 with Dr. Georgianne Fick. Pt worried due to several miscarriages. Offered repeat quant hCG today and then f/u from there. Order placed. Pt amenable to plan.

## 2018-04-06 NOTE — Telephone Encounter (Signed)
Pt was given lotrisone cream in past for yeast infection under breast. Is it ok to use during pregnancy. [phone]

## 2018-04-07 LAB — BETA HCG QUANT (REF LAB): hCG Quant: 25567 m[IU]/mL

## 2018-04-14 ENCOUNTER — Ambulatory Visit (INDEPENDENT_AMBULATORY_CARE_PROVIDER_SITE_OTHER): Payer: BLUE CROSS/BLUE SHIELD | Admitting: Obstetrics and Gynecology

## 2018-04-14 ENCOUNTER — Other Ambulatory Visit: Payer: Self-pay

## 2018-04-14 VITALS — BP 128/82 | Wt 193.0 lb

## 2018-04-14 DIAGNOSIS — Z3A08 8 weeks gestation of pregnancy: Secondary | ICD-10-CM

## 2018-04-14 DIAGNOSIS — E7212 Methylenetetrahydrofolate reductase deficiency: Secondary | ICD-10-CM

## 2018-04-14 DIAGNOSIS — Z1589 Genetic susceptibility to other disease: Secondary | ICD-10-CM

## 2018-04-14 DIAGNOSIS — O99281 Endocrine, nutritional and metabolic diseases complicating pregnancy, first trimester: Secondary | ICD-10-CM

## 2018-04-14 DIAGNOSIS — O10911 Unspecified pre-existing hypertension complicating pregnancy, first trimester: Secondary | ICD-10-CM

## 2018-04-14 DIAGNOSIS — O09521 Supervision of elderly multigravida, first trimester: Secondary | ICD-10-CM

## 2018-04-14 DIAGNOSIS — Z3689 Encounter for other specified antenatal screening: Secondary | ICD-10-CM

## 2018-04-14 DIAGNOSIS — O09291 Supervision of pregnancy with other poor reproductive or obstetric history, first trimester: Secondary | ICD-10-CM

## 2018-04-14 DIAGNOSIS — E039 Hypothyroidism, unspecified: Secondary | ICD-10-CM

## 2018-04-14 DIAGNOSIS — O09529 Supervision of elderly multigravida, unspecified trimester: Secondary | ICD-10-CM

## 2018-04-14 DIAGNOSIS — O10919 Unspecified pre-existing hypertension complicating pregnancy, unspecified trimester: Secondary | ICD-10-CM

## 2018-04-14 DIAGNOSIS — O09299 Supervision of pregnancy with other poor reproductive or obstetric history, unspecified trimester: Secondary | ICD-10-CM

## 2018-04-14 DIAGNOSIS — O0990 Supervision of high risk pregnancy, unspecified, unspecified trimester: Secondary | ICD-10-CM

## 2018-04-14 DIAGNOSIS — O99280 Endocrine, nutritional and metabolic diseases complicating pregnancy, unspecified trimester: Secondary | ICD-10-CM

## 2018-04-14 LAB — POCT URINALYSIS DIPSTICK OB
GLUCOSE, UA: NEGATIVE
POC,PROTEIN,UA: NEGATIVE

## 2018-04-14 NOTE — Progress Notes (Signed)
ROB

## 2018-04-14 NOTE — Progress Notes (Signed)
Routine Prenatal Care Visit  Subjective  Anita Massey is a 38 y.o. [id number] at [redacted]w[redacted]d being seen today for ongoing prenatal care.  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  ----------------------------------------------------------------------------------- Patient reports no complaints.   Contractions: Not present. Vag. Bleeding: None.  Movement: Absent. Denies leaking of fluid.  ----------------------------------------------------------------------------------- The following portions of the patient's history were reviewed and updated as appropriate: allergies, current medications, past family history, past medical history, past social history, past surgical history and problem list. Problem list updated.   Objective  Blood pressure 128/82, weight 193 lb (87.5 kg), last menstrual period 02/17/2018. Pregravid weight 193 lb (87.5 kg) Total Weight Gain 0 lb (0 kg) Urinalysis:      Fetal Status: Fetal Heart Rate (bpm): 144   Movement: Absent     General:  Alert, oriented and cooperative. Patient is in no acute distress.  Skin: Skin is warm and dry. No rash noted.   Cardiovascular: Normal heart rate noted  Respiratory: Normal respiratory effort, no problems with respiration noted  Abdomen: Soft, gravid, appropriate for gestational age. Pain/Pressure: Absent     Pelvic:  Cervical exam deferred        Extremities: Normal range of  motion.     ental Status: Normal mood and affect. Normal behavior. Normal judgment and thought content.     Assessment   38 y.o. [id number] at [redacted]w[redacted]d by  11/24/2018, by Last Menstrual Period presenting for routine prenatal visit  Plan   Pregnancy#6 Problems (from 02/17/18 to present)    Problem Noted Resolved   MTHFR gene mutation ([place]) 03/31/2018 by Malachy Mood, MD No   Overview Signed 03/31/2018  9:50 AM by Malachy Mood, MD    [X]  Additional folic acid      History of maternal third degree perineal laceration, currently pregnant 03/31/2018 by Malachy Mood, MD No   Antepartum multigravida of advanced maternal age 62/17/2020 by Malachy Mood, MD No   Overview Addendum 04/01/2018  2:54 PM by Malachy Mood, MD    Clinic [facility] Prenatal Labs  Dating  Blood type: A positive  Genetic Screen 1 Screen:    AFP:     Quad:     NIPS: Antibody: Negative  Anatomic [place]  Rubella: Immune Varicella: Immune  GTT Early:               Third trimester:  RPR: NR  Rhogam N/A HBsAg: Negative   TDaP vaccine                       Flu Shot: HIV: Non Reactive (12/05 2216)   Baby Food                                GBS:   Contraception  Pap: 01/21/2018 NIL HPV negative  CBB     CS/VBAC Undecided   Support Person               Gestational age appropriate obstetric precautions including but not limited to vaginal  bleeding, contractions, leaking of fluid and fetal movement were reviewed in detail with the patient.    Return in about 4 weeks (around 05/12/2018) for [place], dating scan (in person).  Malachy Mood, MD, [facility], [facility] 04/14/2018, 11:32 AM

## 2018-04-14 NOTE — Patient Instructions (Signed)
Hello,  Given the current COVID-19 pandemic, our practice is making changes in how we are providing care to our patients. We are limiting in-person visits for the safety of all of our patients.   As a practice, we have met to discuss the best way to minimize visits, but still provide excellent care to our expecting mothers.  We have decided on the following visit structure for low-risk pregnancies.  Initial Pregnancy visit will be conducted as a telephone or web visit.  Between 10-14 weeks  there will be one in-person visit for an ultrasound, lab work, and genetic screening. 20 weeks in-person visit with an anatomy ultrasound  28 weeks in-person office visit for a 1-hour glucose test and a TDAP vaccination 32 weeks in-person office visit 34 weeks telephone visit 36 weeks in-person office visit for GBS, chlamydia, and gonorrhea testing 38 weeks in-person office visit 40 weeks in-person office visit  Understandably, some patients will require more visits than what is outlined above. Additional visits will be determined on a case-by-case basis.   We will, as always, be available for emergencies or to address concerns that might arise between in-person visits. We ask that you allow [place] the opportunity to address any concerns over the phone or through a virtual visit first. We will be available to return your phone calls throughout the day.   If you are able to purchase a scale, a blood pressure machine, and a home fetal doppler visits could be limited further. This will help decrease your exposure risks, but these purchases are not a necessity.   Things seem to change daily and there is the possibility that this structure could change, please be patient as we adapt to a new way of caring for patients.   Thank you for trusting [place] with your prenatal care. Our practice values you and looks forward to providing you with excellent care.   Sincerely,   [facility], [place]   COVID-19 and Your Pregnancy FAQ  How can I prevent infection with COVID-19 during my pregnancy? Social distancing is key. Please limit any interactions in public. Try and work from home if possible. Frequently wash your hands after touching possibly contaminated surfaces. Avoid touching your face.  Minimize trips to the store. Consider online ordering when possible.   Should I wear a mask? Masks should only be worn by those experiencing symptoms of COVID-19 or those with confirmed COVID-19 when they are in public or around other individuals.  What are the symptoms of COVID-19? Fever (greater than 100.4 F), dry cough, shortness of breath.  Am I more at risk for COVID-19 since I am pregnant? There is not currently data showing that pregnant women are more adversely impacted by COVID-19 than the general population. However, we know that pregnant women tend to have worse respiratory complications from similar diseases such as the flu and SARS and for this reason should be considered an at-risk population.  What do I do if I am experiencing the symptoms of COVID-19? Testing is being limited because of test availability. If you are experiencing symptoms you should quarantine yourself, and the members of your family, for at least 2 weeks at home.   Please visit this website for more information: [email or web address].za.[email or web address]  When should I go to the Emergency Room? Please go to the emergency room if you are experiencing ANY of these symptoms*:  1.    Difficulty breathing or shortness of breath 2.    Persistent pain or  pressure in the chest 3.    Confusion or difficulty being aroused (or awakened) 4.    Bluish lips or face  *This list is not all inclusive. Please consult our office for any other symptoms that are severe or concerning.  What do I do if I am having difficulty breathing? You should go to the Emergency Room for evaluation. At  this time they have a tent set up for evaluating patients with COVID-19 symptoms.   How will my prenatal care be different because of the COVID-19 pandemic? It has been recommended to reduce the frequency of face-to-face visits and use resources such as telephone and virtual visits when possible. Using a scale, blood pressure machine and fetal doppler at home can further help reduce face-to-face visits. You will be provided with additional information on this topic.  We ask that you come to your visits alone to minimize potential exposures to  COVID-19.  How can I receive childbirth education? At this time in-person classes have been cancelled. You can register for online childbirth education, breastfeeding, and newborn care classes.  Please visit:  CyberComps.hu for more information  How will my hospital birth experience be different? The hospital is currently limiting visitors. This means that while you are in labor you can only have one person at the hospital with you. Additional family members will not be allowed to wait in the building or outside your room. Your one support person can be the father of the baby, a relative, a doula, or a friend. Once one support person is designated that person will wear a band. This band cannot be shared with multiple people.  How long will I stay in the hospital for after giving birth? It is also recommended that discharge home be expedited during the COVID-19 outbreak. This means staying for 1 day after a vaginal delivery and 2 days after a cesarean section.  What if I have COVID-19 and I am in labor? We ask that you wear a mask while on labor and delivery. We will try and accommodate you being placed in a room that is capable of filtering the air. Please call ahead if you are in labor and on your way to the hospital. The phone number for labor and delivery at [facility] is [phone].  If I have COVID-19 when my baby  is born how can I prevent my baby from contracting COVID-19? This is an issue that will have to be discussed on a case-by-case basis. Current recommendations suggest providing separate isolation rooms for both the mother and new infant as well as limiting visitors. However, there are practical challenges to this recommendation. The situation will assuredly change and decisions will be influenced by the desires of the mother and availability of space.  Some suggestions are the use of a curtain or physical barrier between mom and infant, hand hygiene, mom wearing a mask, or 6 feet of spacing between a mom and infant.   Can I breastfeed during the COVID-19 pandemic?   Yes, breastfeeding is encouraged.  Can I breastfeed if I have COVID-19? Yes. Covid-19 has not been found in breast milk. This means you cannot give COVID-19 to your child through breast milk. Breast feeding will also help pass antibodies to fight infection to your baby.   What precautions should I take when breastfeeding if I have COVID-19? If a mother and newborn do room-in and the mother wishes to feed at the breast, she should put on a  facemask and practice hand hygiene before each feeding.  What precautions should I take when pumping if I have COVID-19? Prior to expressing breast milk, mothers should practice hand hygiene. After each pumping session, all parts that come into contact with breast milk should be thoroughly washed and the entire pump should be appropriately disinfected per the manufacturer's instructions. This expressed breast milk should be fed to the newborn by a healthy caregiver.  What if I am pregnant and work in healthcare? Based on limited data regarding COVID-19 and pregnancy, ACOG currently does not propose creating additional restrictions on pregnant health care personnel because of COVID-19 alone. Pregnant women do not appear to be at higher risk of severe disease related to COVID-19. Pregnant health care  personnel should follow CDC risk assessment and infection control guidelines for health care personnel exposed to patients with suspected or confirmed COVID-19. Adherence to recommended infection prevention and control practices is an important part of protecting all health care personnel in health care settings.    Information on COVID-19 in pregnancy is very limited; however, facilities may want to consider limiting exposure of pregnant health care personnel to patients with confirmed or suspected COVID-19 infection, especially during higher-risk procedures (eg, aerosol-generating procedures), if feasible, based on staffing availability.

## 2018-04-15 ENCOUNTER — Telehealth: Payer: Self-pay

## 2018-04-15 NOTE — Telephone Encounter (Signed)
Pt calling to see, given her history, if it's okay for her to have intercourse.  [phone]

## 2018-04-16 NOTE — Telephone Encounter (Signed)
Pt aware.

## 2018-04-16 NOTE — Telephone Encounter (Signed)
Yes it is ok

## 2018-05-11 ENCOUNTER — Ambulatory Visit (INDEPENDENT_AMBULATORY_CARE_PROVIDER_SITE_OTHER): Payer: BLUE CROSS/BLUE SHIELD | Admitting: Obstetrics and Gynecology

## 2018-05-11 ENCOUNTER — Ambulatory Visit (INDEPENDENT_AMBULATORY_CARE_PROVIDER_SITE_OTHER): Payer: BLUE CROSS/BLUE SHIELD

## 2018-05-11 ENCOUNTER — Other Ambulatory Visit: Payer: Self-pay

## 2018-05-11 VITALS — BP 128/82 | Wt 191.0 lb

## 2018-05-11 DIAGNOSIS — O09521 Supervision of elderly multigravida, first trimester: Secondary | ICD-10-CM

## 2018-05-11 DIAGNOSIS — O09299 Supervision of pregnancy with other poor reproductive or obstetric history, unspecified trimester: Secondary | ICD-10-CM

## 2018-05-11 DIAGNOSIS — O10919 Unspecified pre-existing hypertension complicating pregnancy, unspecified trimester: Secondary | ICD-10-CM

## 2018-05-11 DIAGNOSIS — Z3A11 11 weeks gestation of pregnancy: Secondary | ICD-10-CM

## 2018-05-11 DIAGNOSIS — O99210 Obesity complicating pregnancy, unspecified trimester: Secondary | ICD-10-CM

## 2018-05-11 DIAGNOSIS — O99281 Endocrine, nutritional and metabolic diseases complicating pregnancy, first trimester: Secondary | ICD-10-CM

## 2018-05-11 DIAGNOSIS — O99211 Obesity complicating pregnancy, first trimester: Secondary | ICD-10-CM

## 2018-05-11 DIAGNOSIS — Z1589 Genetic susceptibility to other disease: Secondary | ICD-10-CM

## 2018-05-11 DIAGNOSIS — O0990 Supervision of high risk pregnancy, unspecified, unspecified trimester: Secondary | ICD-10-CM

## 2018-05-11 DIAGNOSIS — Z3689 Encounter for other specified antenatal screening: Secondary | ICD-10-CM

## 2018-05-11 DIAGNOSIS — D682 Hereditary deficiency of other clotting factors: Secondary | ICD-10-CM

## 2018-05-11 DIAGNOSIS — Z3687 Encounter for antenatal screening for uncertain dates: Secondary | ICD-10-CM | POA: Diagnosis not present

## 2018-05-11 DIAGNOSIS — O99280 Endocrine, nutritional and metabolic diseases complicating pregnancy, unspecified trimester: Secondary | ICD-10-CM

## 2018-05-11 DIAGNOSIS — E7212 Methylenetetrahydrofolate reductase deficiency: Secondary | ICD-10-CM

## 2018-05-11 DIAGNOSIS — O09291 Supervision of pregnancy with other poor reproductive or obstetric history, first trimester: Secondary | ICD-10-CM

## 2018-05-11 DIAGNOSIS — E039 Hypothyroidism, unspecified: Secondary | ICD-10-CM

## 2018-05-11 DIAGNOSIS — Z1379 Encounter for other screening for genetic and chromosomal anomalies: Secondary | ICD-10-CM

## 2018-05-11 DIAGNOSIS — O09529 Supervision of elderly multigravida, unspecified trimester: Secondary | ICD-10-CM

## 2018-05-11 DIAGNOSIS — O10911 Unspecified pre-existing hypertension complicating pregnancy, first trimester: Secondary | ICD-10-CM

## 2018-05-11 DIAGNOSIS — Z363 Encounter for antenatal screening for malformations: Secondary | ICD-10-CM

## 2018-05-11 LAB — POCT URINALYSIS DIPSTICK OB
Glucose, UA: NEGATIVE
POC,PROTEIN,UA: NEGATIVE

## 2018-05-11 NOTE — Progress Notes (Signed)
Routine Prenatal Care Visit  Subjective  Anita Massey is a 38 y.o. [id number] at [redacted]w[redacted]d being seen today for ongoing prenatal care.  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  ----------------------------------------------------------------------------------- Patient reports no complaints.   Contractions: Not present. Vag. Bleeding: None.  Movement: Absent. Denies leaking of fluid.  ----------------------------------------------------------------------------------- The following portions of the patient's history were reviewed and updated as appropriate: allergies, current medications, past family history, past medical history, past social history, past surgical history and problem list. Problem list updated.   Objective  Blood pressure 128/82, weight 191 lb (86.6 kg), last menstrual period 02/17/2018. Pregravid weight 193 lb (87.5 kg) Total Weight Gain -2 lb (-0.907 kg) Urinalysis:      Fetal Status: Fetal Heart Rate (bpm): 170   Movement: Absent     General:  Alert, oriented and cooperative. Patient is in no acute distress.  Skin: Skin is warm and dry. No rash noted.   Cardiovascular: Normal heart rate noted  Respiratory: Normal respiratory effort, no problems with respiration noted  Abdomen: Soft, gravid, appropriate for gestational age. Pain/Pressure: Absent     Pelvic:  Cervical exam deferred        Extremities: Normal range  of motion.     ental Status: Normal mood and affect. Normal behavior. Normal judgment and thought content.   US Ob Comp Less 14 Wks  Result Date: 05/11/2018 Patient Name: Anita Massey DOB: May 23, 1980 MRN: [id number] ULTRASOUND REPORT Location: [facility] Date of Service: 05/11/2018 Indications:dating Findings: Anita Massey intrauterine pregnancy is visualized with a CRL consistent with [redacted]w[redacted]d gestation, giving an (U/S) EDD of 11/29/2018. The (U/S) EDD is consistent with the clinically established EDD of 11/30/2018. FHR: 172 BPM CRL measurement: 42.2 mm Yolk sac is visualized and appears normal and early anatomy is normal. Amnion: visualized and appears normal Right Ovary is normal in appearance. Left Ovary is normal appearance. Corpus luteal cyst:  is not visualized Survey of the adnexa demonstrates no adnexal masses. There is no free peritoneal fluid in the cul de sac. Impression: 1. [redacted]w[redacted]d Viable Singleton Intrauterine pregnancy by U/S. 2. (U/S) EDD is consistent with Clinically established EDD of 11/30/2018. Recommendations: 1.Clinical correlation with the patient's History and Physical Exam. Lillia Dallas, RDMS  There is a singleton gestation with subjectively normal amniotic fluid volume. The fetal biometry correlates with established dating. Detailed evaluation of the fetal anatomy was performed.The fetal anatomical survey appears within normal limits within the resolution of ultrasound as described above.  It must be noted that a normal ultrasound is unable to rule out fetal aneuploidy.  Malachy Mood, MD, [facility], [facility] 05/11/2018, 11:32 AM     Assessment   38 y.o. 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[place]) 03/31/2018 by Malachy Mood, MD No   Overview Signed 03/31/2018  9:50 AM by  Malachy Mood, MD    [X]  Additional folic acid  History of maternal third degree perineal laceration, currently pregnant 03/31/2018 by Malachy Mood, MD No   Antepartum multigravida of advanced maternal age 15/17/2020 by Malachy Mood, MD No   Overview Addendum 04/01/2018  2:54 PM by Malachy Mood, MD    Clinic [facility] Prenatal Labs  Dating  Blood type: A positive  Genetic Screen 1 Screen:    AFP:     Quad:     NIPS: Antibody: Negative  Anatomic [place]  Rubella: Immune Varicella: Immune  GTT Early:               Third trimester:  RPR: NR  Rhogam N/A HBsAg: Negative   TDaP vaccine                       Flu Shot: HIV: Non Reactive (12/05 2216)   Baby Food                                GBS:   Contraception  Pap: 01/21/2018 NIL HPV negative  CBB     CS/VBAC Undecided   Support Person               Gestational age appropriate obstetric precautions including but not limited to vaginal bleeding, contractions, leaking of fluid and fetal movement were reviewed in detail with the patient.    - early 1-hr in the next 2-3 weeks - Anatomy scan in 8 weeks  Return in about 8 weeks (around 07/06/2018) for anatomy scan, early 1-hr in then next 2-3 weeks labs visit only.  Malachy Mood, MD, [facility], [facility] 05/11/2018, 2:07 PM

## 2018-05-11 NOTE — Progress Notes (Signed)
ROB  Dating scan today 

## 2018-05-12 LAB — THYROID PANEL WITH TSH
Free Thyroxine Index: 2.2 (ref 1.2–4.9)
T3 Uptake Ratio: 19 % — ABNORMAL LOW (ref 24–39)
T4, Total: 11.6 ug/dL (ref 4.5–12.0)
TSH: 0.422 u[IU]/mL — ABNORMAL LOW (ref 0.450–4.500)

## 2018-05-13 ENCOUNTER — Telehealth: Payer: Self-pay

## 2018-05-13 ENCOUNTER — Other Ambulatory Visit: Payer: Self-pay | Admitting: Obstetrics and Gynecology

## 2018-05-13 MED ORDER — ONDANSETRON 4 MG PO TBDP: 4.0000 mg | ORAL_TABLET | Freq: Four times a day (QID) | ORAL | 1 refills | Status: DC | PRN

## 2018-05-13 MED ORDER — DOXYLAMINE-PYRIDOXINE 10-10 MG PO TBEC: 2.0000 | DELAYED_RELEASE_TABLET | Freq: Every day | ORAL | 5 refills | Status: DC

## 2018-05-13 MED ORDER — NITROFURANTOIN MONOHYD MACRO 100 MG PO CAPS: 100.0000 mg | ORAL_CAPSULE | Freq: Two times a day (BID) | ORAL | 1 refills | Status: DC

## 2018-05-13 NOTE — Telephone Encounter (Signed)
Have been sent

## 2018-05-13 NOTE — Telephone Encounter (Signed)
Pt inquiring about Thyroid labs done 05/11/2018. DV#[phone]

## 2018-05-13 NOTE — Telephone Encounter (Signed)
Patient is calling about two prescriptions that would be sent in for nausea and antibiotic that was to treat an uti are not at the Pharmacy. Please advise

## 2018-05-16 LAB — MATERNIT 21 PLUS CORE, BLOOD
Fetal Fraction: 11
Result (T21): NEGATIVE
Trisomy 13 (Patau syndrome): NEGATIVE
Trisomy 18 (Edwards syndrome): NEGATIVE
Trisomy 21 (Down syndrome): NEGATIVE

## 2018-05-26 ENCOUNTER — Other Ambulatory Visit: Payer: BLUE CROSS/BLUE SHIELD

## 2018-05-26 ENCOUNTER — Telehealth: Payer: Self-pay

## 2018-05-26 ENCOUNTER — Other Ambulatory Visit: Payer: Self-pay | Admitting: Obstetrics and Gynecology

## 2018-05-26 ENCOUNTER — Other Ambulatory Visit: Payer: Self-pay

## 2018-05-26 ENCOUNTER — Other Ambulatory Visit: Payer: Self-pay | Admitting: Internal Medicine

## 2018-05-26 DIAGNOSIS — O0990 Supervision of high risk pregnancy, unspecified, unspecified trimester: Secondary | ICD-10-CM

## 2018-05-26 DIAGNOSIS — O99210 Obesity complicating pregnancy, unspecified trimester: Secondary | ICD-10-CM

## 2018-05-26 DIAGNOSIS — O09529 Supervision of elderly multigravida, unspecified trimester: Secondary | ICD-10-CM

## 2018-05-26 MED ORDER — LABETALOL HCL 100 MG PO TABS: 100.0000 mg | ORAL_TABLET | Freq: Two times a day (BID) | ORAL | 9 refills | Status: DC

## 2018-05-26 NOTE — Telephone Encounter (Signed)
Pt called triage requesting to have a refill on her labetalol even though AMS is not the provider that originally prescribed it. Please advise. Thank you

## 2018-05-26 NOTE — Telephone Encounter (Signed)
sent 

## 2018-05-27 ENCOUNTER — Other Ambulatory Visit: Payer: Self-pay | Admitting: Obstetrics and Gynecology

## 2018-05-27 DIAGNOSIS — Z1589 Genetic susceptibility to other disease: Secondary | ICD-10-CM

## 2018-05-27 DIAGNOSIS — O09529 Supervision of elderly multigravida, unspecified trimester: Secondary | ICD-10-CM

## 2018-05-27 DIAGNOSIS — E039 Hypothyroidism, unspecified: Secondary | ICD-10-CM

## 2018-05-27 DIAGNOSIS — E7212 Methylenetetrahydrofolate reductase deficiency: Secondary | ICD-10-CM

## 2018-05-27 DIAGNOSIS — R7309 Other abnormal glucose: Secondary | ICD-10-CM

## 2018-05-27 DIAGNOSIS — D682 Hereditary deficiency of other clotting factors: Secondary | ICD-10-CM

## 2018-05-27 DIAGNOSIS — O0990 Supervision of high risk pregnancy, unspecified, unspecified trimester: Secondary | ICD-10-CM

## 2018-05-27 DIAGNOSIS — O09299 Supervision of pregnancy with other poor reproductive or obstetric history, unspecified trimester: Secondary | ICD-10-CM

## 2018-05-27 DIAGNOSIS — O10919 Unspecified pre-existing hypertension complicating pregnancy, unspecified trimester: Secondary | ICD-10-CM

## 2018-05-27 LAB — GLUCOSE TOLERANCE, 1 HOUR: Glucose, 1Hr PP: 178 mg/dL (ref 65–199)

## 2018-05-28 ENCOUNTER — Telehealth: Payer: Self-pay | Admitting: Obstetrics and Gynecology

## 2018-05-28 NOTE — Telephone Encounter (Signed)
Unable to leave voicemail. Sent message thru mychart trying to contact patient.

## 2018-05-28 NOTE — Telephone Encounter (Signed)
-----   Message from Malachy Mood, MD sent at 05/27/2018  4:12 PM EDT ----- Needs 3-hr OGTT in the next week.   Malachy Mood, MD, [facility], [facility] 05/27/2018, 4:13 PM

## 2018-06-02 ENCOUNTER — Encounter: Payer: Self-pay | Admitting: Obstetrics & Gynecology

## 2018-06-02 ENCOUNTER — Ambulatory Visit (INDEPENDENT_AMBULATORY_CARE_PROVIDER_SITE_OTHER): Payer: BLUE CROSS/BLUE SHIELD | Admitting: Obstetrics & Gynecology

## 2018-06-02 ENCOUNTER — Other Ambulatory Visit: Payer: Self-pay

## 2018-06-02 ENCOUNTER — Other Ambulatory Visit: Payer: BLUE CROSS/BLUE SHIELD

## 2018-06-02 VITALS — BP 100/60 | Wt 189.0 lb

## 2018-06-02 DIAGNOSIS — R102 Pelvic and perineal pain: Secondary | ICD-10-CM

## 2018-06-02 DIAGNOSIS — O0990 Supervision of high risk pregnancy, unspecified, unspecified trimester: Secondary | ICD-10-CM

## 2018-06-02 DIAGNOSIS — N3001 Acute cystitis with hematuria: Secondary | ICD-10-CM

## 2018-06-02 DIAGNOSIS — N939 Abnormal uterine and vaginal bleeding, unspecified: Secondary | ICD-10-CM | POA: Diagnosis not present

## 2018-06-02 DIAGNOSIS — R7309 Other abnormal glucose: Secondary | ICD-10-CM

## 2018-06-02 LAB — POCT URINALYSIS DIPSTICK
Bilirubin, UA: NEGATIVE
Blood, UA: POSITIVE
Glucose, UA: NEGATIVE
Ketones, UA: NEGATIVE
Nitrite, UA: NEGATIVE
Protein, UA: POSITIVE — AB
Spec Grav, UA: 1.01 (ref 1.010–1.025)
Urobilinogen, UA: 0.2 E.U./dL
pH, UA: 5 (ref 5.0–8.0)

## 2018-06-02 NOTE — Progress Notes (Signed)
HPI:      [name] is a 38 y.o. [id number] who LMP was Patient's last menstrual period was 02/17/2018 (approximate)., presents today for a problem visit.    Urinary Tract Infection: Patient complains of burning with urination and spotting . She has had symptoms for intermittently for a few days. Patient also complains of mild nausea. Patient denies fever, headache and vaginal discharge. Patient does have a history of recurrent UTI.  Patient does not have a history of pyelonephritis.   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 Also,  has a past surgical history that includes Dilation and curettage of uterus; Appendectomy (2009); Cesarean section (2016); and Cholecystectomy (N/A, 03/20/2016)., family history includes Arrhythmia in her mother; Cancer in her maternal grandfather and maternal grandmother; Depression in her maternal aunt; Diabetes in her brother, maternal aunt, and mother; Heart disease in her maternal aunt and maternal grandfather; Hyperlipidemia in her father; Hypertension in her father and mother; Lung cancer in her maternal grandfather; Mitral valve prolapse in her mother.,  reports that she quit smoking about 20 years ago. Her smoking use included cigarettes. She has a 0.25 pack-year smoking history. She has never used smokeless tobacco. She reports that she does not drink alcohol or use drugs.  She has a current medication list which includes the following prescription(s): aspirin ec, doxylamine-pyridoxine, folic acid, labetalol, levothyroxine, nitrofurantoin (macrocrystal-monohydrate), ondansetron, propranolol, and sertraline. Also, is allergic to biaxin [clarithromycin];  toradol [ketorolac tromethamine]; prednisone; morphine and related; and tape.  Review of Systems  Constitutional: Negative for chills, fever and malaise/fatigue.  HENT: Negative for congestion, sinus pain and sore throat.   Eyes: Negative for blurred vision and pain.  Respiratory: Negative for cough and wheezing.   Cardiovascular: Negative for chest pain and leg swelling.  Gastrointestinal: Negative for abdominal pain, constipation, diarrhea, heartburn, nausea and vomiting.  Genitourinary: Negative for dysuria, frequency, hematuria and urgency.  Musculoskeletal: Negative for back pain, joint pain, myalgias and neck pain.  Skin: Negative for itching and rash.  Neurological: Negative for dizziness, tremors and weakness.  Endo/Heme/Allergies: Does not bruise/bleed easily.  Psychiatric/Behavioral: Negative for depression. The patient is not nervous/anxious and does not have insomnia.     Objective: BP 100/60   Wt 189 lb (85.7 kg)   LMP 02/17/2018 (Approximate)   BMI 34.57 kg/m  Physical Exam Constitutional:      General: She is not in acute distress.    Appearance: She is well-developed.  Musculoskeletal: Normal range of motion.  Neurological:     Mental Status: She is alert and oriented to person, place, and time.  Skin:    General: Skin is warm and dry.  Vitals signs reviewed.   FHT 160s  Results for orders placed or performed in visit on 06/02/18  POCT urinalysis dipstick  Result Value Ref Range   Color, UA     Clarity, UA     Glucose, UA Negative Negative   Bilirubin, UA neg    Ketones, UA neg    Spec Grav, UA 1.010 1.010 - 1.025   Blood, UA Positive    pH, UA 5.0 5.0 - 8.0   Protein, UA Positive (A) Negative   Urobilinogen, UA 0.2 0.2 or 1.0 E.U./dL   Nitrite, UA neg'    Leukocytes, UA Small (  1+) (A) Negative   Appearance     Odor      ASSESSMENT/PLAN:   Acute cystitis   Pelvic pain    -  Primary   Relevant Orders   POCT urinalysis dipstick (Completed)  UTI as on etiology Monitor pregnancy (15 weeks) for additional etiology   Vaginal spotting       Relevant Orders   FWR Monitor bleeding   Acute cystitis with hematuria       Relevant Orders   Urine Culture Treat w [place] for GDM today as well  Barnett Applebaum, MD, [place], [facility] 06/02/2018  10:13 AM

## 2018-06-02 NOTE — Patient Instructions (Signed)
Nitrofurantoin tablets or capsules Twice daily for 5 days  What is this medicine? NITROFURANTOIN (nye troe fyoor AN toyn) is an antibiotic. It is used to treat urinary tract infections. This medicine may be used for other purposes; ask your health care provider or pharmacist if you have questions. COMMON BRAND NAME(S): Macrobid, Macrodantin, Urotoin What should I tell my health care provider before I take this medicine? They need to know if you have any of these conditions: -anemia -diabetes -glucose-6-phosphate dehydrogenase deficiency -kidney disease -liver disease -lung disease -other chronic illness -an unusual or allergic reaction to nitrofurantoin, other antibiotics, other medicines, foods, dyes or preservatives -pregnant or trying to get pregnant -breast-feeding How should I use this medicine? Take this medicine by mouth with a glass of water. Follow the directions on the prescription label. Take this medicine with food or milk. Take your doses at regular intervals. Do not take your medicine more often than directed. Do not stop taking except on your doctor's advice. Talk to your pediatrician regarding the use of this medicine in children. While this drug may be prescribed for selected conditions, precautions do apply. Overdosage: If you think you have taken too much of this medicine contact a poison control center or emergency room at once. NOTE: This medicine is only for you. Do not share this medicine with others. What if I miss a dose? If you miss a dose, take it as soon as you can. If it is almost time for your next dose, take only that dose. Do not take double or extra doses. What may interact with this medicine? -antacids containing magnesium trisilicate -probenecid -quinolone antibiotics like ciprofloxacin, lomefloxacin, norfloxacin and ofloxacin -sulfinpyrazone This list may not describe all possible interactions. Give your health care provider a list of all the  medicines, herbs, non-prescription drugs, or dietary supplements you use. Also tell them if you smoke, drink alcohol, or use illegal drugs. Some items may interact with your medicine. What should I watch for while using this medicine? Tell your doctor or health care professional if your symptoms do not improve or if you get new symptoms. Drink several glasses of water a day. If you are taking this medicine for a long time, visit your doctor for regular checks on your progress. If you are diabetic, you may get a false positive result for sugar in your urine with certain brands of urine tests. Check with your doctor. What side effects may I notice from receiving this medicine? Side effects that you should report to your doctor or health care professional as soon as possible: -allergic reactions like skin rash or hives, swelling of the face, lips, or tongue -chest pain -cough -difficulty breathing -dizziness, drowsiness -fever or infection -joint aches or pains -pale or blue-tinted skin -redness, blistering, peeling or loosening of the skin, including inside the mouth -tingling, burning, pain, or numbness in hands or feet -unusual bleeding or bruising -unusually weak or tired -yellowing of eyes or skin Side effects that usually do not require medical attention (report to your doctor or health care professional if they continue or are bothersome): -dark urine -diarrhea -headache -loss of appetite -nausea or vomiting -temporary hair loss This list may not describe all possible side effects. Call your doctor for medical advice about side effects. You may report side effects to FDA at 1-800-FDA-1088. Where should I keep my medicine? Keep out of the reach of children. Store at room temperature between 15 and 30 degrees C (59 and 86 degrees F). Protect  from light. Throw away any unused medicine after the expiration date. NOTE: This sheet is a summary. It may not cover all possible information.  If you have questions about this medicine, talk to your doctor, pharmacist, or health care provider.  2019 Elsevier/Gold Standard (2007-07-22 15:56:47)

## 2018-06-03 ENCOUNTER — Telehealth: Payer: Self-pay

## 2018-06-03 LAB — GESTATIONAL GLUCOSE TOLERANCE
Glucose, Fasting: 91 mg/dL (ref 65–94)
Glucose, GTT - 1 Hour: 147 mg/dL (ref 65–179)
Glucose, GTT - 2 Hour: 143 mg/dL (ref 65–154)
Glucose, GTT - 3 Hour: 125 mg/dL (ref 65–139)

## 2018-06-03 NOTE — Telephone Encounter (Signed)
Pt called to ask for 3 hr GTT results, per results, pt is aware within normal range. She asked if she would have repeat this 3 hr GTT at 28 weeks like doc had said, I told her I could not answer that question since I was not present in her their conversation. I advised to ask the provider she sees at her next appt.

## 2018-06-04 LAB — URINE CULTURE

## 2018-06-08 ENCOUNTER — Emergency Department
Admission: EM | Admit: 2018-06-08 | Discharge: 2018-06-09 | Disposition: A | Discharge status: Home / Self Care | Payer: BLUE CROSS/BLUE SHIELD | Attending: Emergency Medicine | Admitting: Emergency Medicine

## 2018-06-08 ENCOUNTER — Encounter: Payer: Self-pay | Admitting: Emergency Medicine

## 2018-06-08 ENCOUNTER — Other Ambulatory Visit: Payer: Self-pay

## 2018-06-08 ENCOUNTER — Emergency Department: Payer: BLUE CROSS/BLUE SHIELD

## 2018-06-08 DIAGNOSIS — O209 Hemorrhage in early pregnancy, unspecified: Secondary | ICD-10-CM | POA: Diagnosis not present

## 2018-06-08 DIAGNOSIS — O4400 Placenta previa specified as without hemorrhage, unspecified trimester: Secondary | ICD-10-CM

## 2018-06-08 DIAGNOSIS — Z3A14 14 weeks gestation of pregnancy: Secondary | ICD-10-CM | POA: Diagnosis not present

## 2018-06-08 DIAGNOSIS — N939 Abnormal uterine and vaginal bleeding, unspecified: Secondary | ICD-10-CM

## 2018-06-08 DIAGNOSIS — E039 Hypothyroidism, unspecified: Secondary | ICD-10-CM | POA: Diagnosis not present

## 2018-06-08 DIAGNOSIS — O10012 Pre-existing essential hypertension complicating pregnancy, second trimester: Secondary | ICD-10-CM | POA: Insufficient documentation

## 2018-06-08 DIAGNOSIS — Z3492 Encounter for supervision of normal pregnancy, unspecified, second trimester: Secondary | ICD-10-CM

## 2018-06-08 DIAGNOSIS — O99280 Endocrine, nutritional and metabolic diseases complicating pregnancy, unspecified trimester: Secondary | ICD-10-CM | POA: Diagnosis not present

## 2018-06-08 DIAGNOSIS — Z87891 Personal history of nicotine dependence: Secondary | ICD-10-CM | POA: Insufficient documentation

## 2018-06-08 LAB — CBC WITH DIFFERENTIAL/PLATELET
Abs Immature Granulocytes: 0.06 10*3/uL (ref 0.00–0.07)
Basophils Absolute: 0 10*3/uL (ref 0.0–0.1)
Basophils Relative: 0 %
Eosinophils Absolute: 0.1 10*3/uL (ref 0.0–0.5)
Eosinophils Relative: 1 %
HCT: 37.8 % (ref 36.0–46.0)
Hemoglobin: 13.1 g/dL (ref 12.0–15.0)
Immature Granulocytes: 1 %
Lymphocytes Relative: 23 %
Lymphs Abs: 2.8 10*3/uL (ref 0.7–4.0)
MCH: 30.1 pg (ref 26.0–34.0)
MCHC: 34.7 g/dL (ref 30.0–36.0)
MCV: 86.9 fL (ref 80.0–100.0)
Monocytes Absolute: 0.6 10*3/uL (ref 0.1–1.0)
Monocytes Relative: 5 %
Neutro Abs: 8.4 10*3/uL — ABNORMAL HIGH (ref 1.7–7.7)
Neutrophils Relative %: 70 %
Platelets: 317 10*3/uL (ref 150–400)
RBC: 4.35 MIL/uL (ref 3.87–5.11)
RDW: 12.1 % (ref 11.5–15.5)
WBC: 12 10*3/uL — ABNORMAL HIGH (ref 4.0–10.5)
nRBC: 0 % (ref 0.0–0.2)

## 2018-06-08 LAB — BASIC METABOLIC PANEL
Anion gap: 10 (ref 5–15)
BUN: 10 mg/dL (ref 6–20)
CO2: 19 mmol/L — ABNORMAL LOW (ref 22–32)
Calcium: 9.3 mg/dL (ref 8.9–10.3)
Chloride: 108 mmol/L (ref 98–111)
Creatinine, Ser: 0.58 mg/dL (ref 0.44–1.00)
GFR calc Af Amer: >60 mL/min (ref >60)
GFR calc non Af Amer: >60 mL/min (ref >60)
Glucose, Bld: 135 mg/dL — ABNORMAL HIGH (ref 70–99)
Potassium: 3.5 mmol/L (ref 3.5–5.1)
Sodium: 137 mmol/L (ref 135–145)

## 2018-06-08 LAB — HCG, QUANTITATIVE, PREGNANCY: hCG, Beta Chain, Quant, S: 45033 m[IU]/mL — ABNORMAL HIGH (ref <5)

## 2018-06-08 LAB — ABO/RH: ABO/RH(D): A POS

## 2018-06-08 NOTE — ED Notes (Signed)
Patient transported to Ultrasound 

## 2018-06-08 NOTE — Discharge Instructions (Addendum)
Please seek medical attention for any high fevers, chest pain, shortness of breath, change in behavior, persistent vomiting, bloody stool or any other new or concerning symptoms.  

## 2018-06-08 NOTE — ED Triage Notes (Signed)
Pt c/o abdominal cramping x1 day with vaginal bleeding "spotting". Pt is [redacted]wks pregnant with 3 miscarriages in past.

## 2018-06-08 NOTE — ED Notes (Signed)
Attempted to doppler fetal heart tones at this time, was unable to find. [place] rn same.

## 2018-06-08 NOTE — ED Provider Notes (Signed)
[facility] Emergency Department Provider Note       Time seen: ----------------------------------------- 9:46 PM on 06/08/2018 -----------------------------------------   I have reviewed the triage vital signs and the nursing notes.  HISTORY   Chief Complaint Vaginal Bleeding    HPI Anita Massey is a 38 y.o. female with a history of anemia, clotting disorder, GERD, hypothyroidism, obesity who presents to the ED for general bleeding and for second trimester.  Patient reports cramping today with vaginal bleeding that she describes as spotting.  She is [redacted] weeks pregnant and has had 3 miscarriages in the past.  Past Medical History:  Diagnosis Date  . Anemia   . Anxiety state, unspecified   . Blood dyscrasia    PROTHROMBIN G [id number] HETEROZYGOSITY (FACTOR 2)  . Clotting disorder ([place])   . GERD (gastroesophageal reflux disease)    OCC  . 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 Hypertension   . Hypothyroidism   . Multiple joint pain 09/04/2015  . Obesity   . Rosacea   . Undiagnosed cardiac murmurs   . Unspecified essential hypertension     Patient Active Problem List   Diagnosis Date Noted  . Supervision of high risk pregnancy, antepartum 04/01/2018  . Hypothyroidism affecting pregnancy, antepartum 04/01/2018  . 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 Chronic hypertension in pregnancy 03/31/2018  . History of maternal third degree perineal laceration, currently pregnant 03/31/2018  . Antepartum multigravida of advanced maternal age 61/17/2020  . Preventative health care 01/21/2018  . IBS (irritable bowel syndrome) 09/19/2017  . Asthmatic bronchitis 11/08/2016  . Adrenal adenoma 09/11/2016  . Panic anxiety syndrome 07/23/2016  . Allergic urticaria   . Rosacea 08/15/2015  . 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 Palpitations 04/09/2011    Past Surgical History:  Procedure Laterality Date  .  APPENDECTOMY  2009   [facility]  . CESAREAN SECTION  2016  . CHOLECYSTECTOMY N/A 03/20/2016   Procedure: LAPAROSCOPIC CHOLECYSTECTOMY;  Surgeon: Jules Husbands, MD;  Location: [facility] ORS;  Service: General;  Laterality: N/A;  . DILATION AND CURETTAGE OF UTERUS      Allergies Biaxin [clarithromycin]; Toradol [ketorolac tromethamine]; Prednisone; Morphine and related; and Tape  Social History Social History   Tobacco Use  . Smoking status: Former Smoker    Packs/day: 0.25    Years: 1.00    Pack years: 0.25    Types: Cigarettes    Last attempt to quit: 03/13/1998    Years since quitting: 20.2  . Smokeless tobacco: Never Used  Substance Use Topics  . Alcohol use: No  . Drug use: No   Review of Systems Constitutional: Negative for fever. Cardiovascular: Negative for chest pain. Respiratory: Negative for shortness of breath. Gastrointestinal: Positive for pelvic cramping Genitourinary: Positive for vaginal spotting Musculoskeletal: Negative for back pain. Skin: Negative for rash. Neurological: Negative for headaches, focal weakness or numbness.  All systems negative/normal/unremarkable except as stated in the HPI  ____________________________________________   PHYSICAL EXAM:  VITAL SIGNS: ED Triage Vitals [06/08/18 2040]  Enc Vitals Group     BP (!) 138/94     Pulse Rate (!) 103     Resp 18     Temp 98.2 F (36.8 C)     Temp Source Oral     SpO2 98 %     Weight 189 lb (85.7 kg)     Height 5\' 2"  (1.575 m)     Head Circumference  Peak Flow      Pain Score      Pain Loc      Pain Edu?      Excl. in [place]?    Constitutional: Alert and oriented. Well appearing and in no distress. Eyes: Conjunctivae are normal. Normal extraocular movements. Cardiovascular: Normal rate, regular rhythm. No murmurs, rubs, or gallops. Respiratory: Normal respiratory effort without tachypnea nor retractions. Breath sounds are clear and equal bilaterally. No  wheezes/rales/rhonchi. Gastrointestinal: Soft and nontender. Normal bowel sounds Musculoskeletal: Nontender with normal range of motion in extremities. No lower extremity tenderness nor edema. Neurologic:  Normal speech and language. No gross focal neurologic deficits are appreciated.  Skin:  Skin is warm, dry and intact. No rash noted. Psychiatric: Mood and affect are normal. Speech and behavior are normal.  ____________________________________________  ED COURSE:  As part of my medical decision making, I reviewed the following data within the [place] History obtained from family if available, nursing notes, old chart and ekg, as well as notes from prior ED visits. Patient presented for threatened miscarriage, we will assess with labs and imaging as indicated at this time. Clinical Course as of Jun 07 2245  Mon Jun 08, 2018  2153 G 6 P2 Ab 3   [JW]    Clinical Course User Index [JW] Earleen Newport, MD   Procedures  Anita Massey was evaluated in Emergency Department on 06/08/2018 for the symptoms described in the history of present illness. She was evaluated in the context of the global COVID-19 pandemic, which necessitated consideration that the patient might be at risk for infection with the SARS-CoV-2 virus that causes COVID-19. Institutional protocols and algorithms that pertain to the evaluation of patients at risk for COVID-19 are in a state of rapid change based on information released by regulatory bodies including the CDC and federal and state organizations. These policies and algorithms were followed during the patient's care in the ED.  ____________________________________________   LABS (pertinent positives/negatives)  Labs Reviewed  HCG, QUANTITATIVE, PREGNANCY - Abnormal; Notable for the following components:      Result Value   hCG, Beta Chain, Quant, S 45,033 (*)    All other components within normal limits  CBC WITH DIFFERENTIAL/PLATELET -  Abnormal; Notable for the following components:   WBC 12.0 (*)    Neutro Abs 8.4 (*)    All other components within normal limits  BASIC METABOLIC PANEL - Abnormal; Notable for the following components:   CO2 19 (*)    Glucose, Bld 135 (*)    All other components within normal limits  POC URINE PREG, ED  ABO/RH    RADIOLOGY  Pregnancy ultrasound Is pending at this time ____________________________________________   DIFFERENTIAL DIAGNOSIS   Threatened miscarriage, miscarriage, ectopic, anemia  FINAL ASSESSMENT AND PLAN  Vaginal bleeding, second trimester   Plan: The patient had presented for vaginal bleeding in the second trimester. Patient's labs were unremarkable. Patient's imaging is pending at this time, anticipate threatened miscarriage.   Laurence Aly, MD    Note: This note was generated in part or whole with voice recognition software. Voice recognition is usually quite accurate but there are transcription errors that can and very often do occur. I apologize for any typographical errors that were not detected and corrected.     Earleen Newport, MD 06/08/18 [phone]

## 2018-06-08 NOTE — ED Notes (Signed)
Pt requesting to urinate; explained that [place] will want bladder full to begin with. Pt agrees to hold urine until back from [place]. Pt leaving for [place].

## 2018-06-08 NOTE — ED Provider Notes (Signed)
US shows a single live IUP. Concern for placenta previa. Discussed this finding with the patient. Discussed pelvic rest. Discussed importance of follow up with ob/gyn.   Nance Pear, MD 06/08/18 [phone]

## 2018-06-11 ENCOUNTER — Telehealth: Payer: Self-pay

## 2018-06-11 NOTE — Telephone Encounter (Signed)
Spoke to pt's Mom 06-09-18 and she said the pt was doing pretty good. She is resting as much as possible.

## 2018-06-15 ENCOUNTER — Telehealth: Payer: Self-pay

## 2018-06-15 NOTE — Telephone Encounter (Signed)
Pt called after hour nurse 06/13/18 at 3:56pm c/o 16wks; hip pain; can hardly walk; pain started Fri.  SDJ adv against going to ED given pandemic and not much to do for this as long as no ctxs.  Adv warm bath, heat and tylenol.  [phone] Called to f/u c pt; states she is better today but still has pain; not sure why/what caused it.  Adv it could be round ligament pain.  To apply heat 43min qhr and e.s. tylenol.

## 2018-07-06 ENCOUNTER — Ambulatory Visit (INDEPENDENT_AMBULATORY_CARE_PROVIDER_SITE_OTHER): Payer: BLUE CROSS/BLUE SHIELD | Admitting: Obstetrics and Gynecology

## 2018-07-06 ENCOUNTER — Other Ambulatory Visit: Payer: Self-pay

## 2018-07-06 ENCOUNTER — Ambulatory Visit (INDEPENDENT_AMBULATORY_CARE_PROVIDER_SITE_OTHER): Payer: BLUE CROSS/BLUE SHIELD

## 2018-07-06 VITALS — BP 124/82 | Wt 185.0 lb

## 2018-07-06 DIAGNOSIS — O09522 Supervision of elderly multigravida, second trimester: Secondary | ICD-10-CM

## 2018-07-06 DIAGNOSIS — O09292 Supervision of pregnancy with other poor reproductive or obstetric history, second trimester: Secondary | ICD-10-CM

## 2018-07-06 DIAGNOSIS — O09529 Supervision of elderly multigravida, unspecified trimester: Secondary | ICD-10-CM

## 2018-07-06 DIAGNOSIS — Z3A19 19 weeks gestation of pregnancy: Secondary | ICD-10-CM

## 2018-07-06 DIAGNOSIS — O0990 Supervision of high risk pregnancy, unspecified, unspecified trimester: Secondary | ICD-10-CM

## 2018-07-06 DIAGNOSIS — Z363 Encounter for antenatal screening for malformations: Secondary | ICD-10-CM | POA: Diagnosis not present

## 2018-07-06 LAB — POCT URINALYSIS DIPSTICK OB: Glucose, UA: NEGATIVE

## 2018-07-06 MED ORDER — NYSTATIN 100000 UNIT/GM EX CREA: 1.0000 "application " | TOPICAL_CREAM | Freq: Two times a day (BID) | CUTANEOUS | 1 refills | Status: DC

## 2018-07-06 NOTE — Progress Notes (Signed)
ROB C/o pain in pelvic area, rash under arms and breast area  Denise lof, no vb, Good FM

## 2018-07-06 NOTE — Progress Notes (Signed)
Routine Prenatal Care Visit  Subjective  Anita Massey is a 38 y.o. [id number] at [redacted]w[redacted]d being seen today for ongoing prenatal care.  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  ----------------------------------------------------------------------------------- Patient reports no complaints.   Contractions: Not present. Vag. Bleeding: None.  Movement: Present. Denies leaking of fluid.  ----------------------------------------------------------------------------------- The following portions of the patient's history were reviewed and updated as appropriate: allergies, current medications, past family history, past medical history, past social history, past surgical history and problem list. Problem list updated.   Objective  Blood pressure 124/82, weight 185 lb (83.9 kg), last menstrual period 02/17/2018. Pregravid weight 193 lb (87.5 kg) Total Weight Gain -8 lb (-3.629 kg) Urinalysis:      Fetal Status: Fetal Heart Rate (bpm): 161   Movement: Present     General:  Alert, oriented and cooperative. Patient is in no acute distress.  Skin: Skin is warm and dry. No rash noted.   Cardiovascular: Normal heart rate noted  Respiratory: Normal respiratory effort, no problems with respiration noted  Abdomen: Soft, gravid, appropriate for gestational age. Pain/Pressure: Present     Pelvic:  Cervical exam deferred        Extremities: Normal  range of motion.     ental Status: Normal mood and affect. Normal behavior. Normal judgment and thought content.   US Ob Comp + 14 Wk  Result Date: 07/06/2018 Patient Name: Anita Massey DOB: 08/26/1980 MRN: [id number] ULTRASOUND REPORT Location: [facility] Date of Service: 07/06/2018 Indications:Anatomy Ultrasound Findings: Anita Massey intrauterine pregnancy is visualized with FHR at 161 BPM. Biometrics give an (U/S) Gestational age of [redacted]w[redacted]d and an (U/S) EDD of 11/29/2018; this correlates with the clinically established Estimated Date of Delivery: 11/30/18 Fetal presentation is Cephalic. EFW: 275g (10oz). Placenta: posterior. Grade: 0 AFI: subjectively normal. Cervix measures 4.2 cm Anatomic survey is complete and normal; Gender - female.  There is a marginal cord insertion. There is no free peritoneal fluid in the cul de sac. Impression: 1. [redacted]w[redacted]d Viable Singleton Intrauterine pregnancy by U/S. 2. (U/S) EDD is consistent with Clinically established Estimated Date of Delivery: 11/30/18 . 3. Normal Anatomy Scan 4. Posterior placenta with a marginal cord insertion on the inferior end. Recommendations: 1.Clinical correlation with the patient's History and Physical Exam. Gweneth Dimitri, RT  There is a singleton gestation with subjectively normal amniotic fluid volume. The fetal biometry correlates with established dating. Detailed evaluation of the fetal anatomy was performed.The fetal anatomical survey appears within normal limits within the resolution of ultrasound as described above.  It must be noted that a normal ultrasound is unable to rule out fetal aneuploidy, subtle defects such as small ASD or VDS may also not be visible on imaging.  Malachy Mood, MD, [facility], [facility] 07/06/2018, 12:21 PM   US Ob Limited  Result Date: 06/08/2018 CLINICAL DATA:  Spotting.  Cramping. EXAM: LIMITED OBSTETRIC ULTRASOUND FINDINGS: Number of Fetuses: 1 Heart Rate:  163 bpm Movement:  Yes Presentation: Cephalic Placental Location: Posterior Previa: Complete Amniotic Fluid (Subjective):  Within normal limits. BPD: 2.9 cm  15 w  2 d MATERNAL FINDINGS: Cervix:  Appears closed.  The cervix measures approximately 3.9 cm. Uterus/Adnexae: No abnormality visualized. IMPRESSION: Single live IUP as detailed above at 15 weeks and 2 days. There is evidence of placenta previa. This exam is performed on an emergent basis and does not comprehensively evaluate fetal size, dating, or anatomy; follow-up complete OB US should be considered if further fetal assessment is warranted. Electronically Signed   By: Constance Holster M.D.   On: 06/08/2018 23:29     Assessment   38 y.o. 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[place]) 03/31/2018 by Malachy Mood, MD No   Overview Signed 03/31/2018  9:50 AM by Malachy Mood, MD    [X]  Additional folic acid      History of maternal third degree perineal laceration, currently pregnant 03/31/2018 by Malachy Mood, MD No   Antepartum multigravida of advanced maternal age 10/31/2018 by Malachy Mood, MD No   Overview Addendum 06/04/2018  8:02 PM by Malachy Mood, MD    Clinic [facility] Prenatal Labs  Dating LMP = 11 week [place] Blood type: A positive  Genetic Screen NIPS: Normal XY Antibody: Negative  Anatomic [place] Normal Rubella: Immune Varicella: Immune  GTT Early: 178 3-hr 91, 147, 143, 125  Third trimester:  RPR: NR  Rhogam N/A HBsAg: Negative   TDaP vaccine                       Flu Shot: HIV: Non Reactive (12/05 2216)   Baby Food                                GBS:   Contraception  Pap: 01/21/2018 NIL HPV negative  CBB     CS/VBAC Undecided   Support Person               Gestational age  appropriate obstetric precautions including but not limited to vaginal bleeding, contractions, leaking of fluid and fetal movement were reviewed in detail with the patient.    - Rash in axillar right breast, pruritic rx nystatin, if no improvement discussed triamcinolone   - normal anatomy scan today  Return in about 4 weeks (around 08/03/2018) for [place].  Malachy Mood, MD, [facility], [facility] 07/06/2018, 1:44 PM

## 2018-08-03 ENCOUNTER — Encounter: Payer: BLUE CROSS/BLUE SHIELD | Admitting: Obstetrics and Gynecology

## 2018-08-11 ENCOUNTER — Ambulatory Visit (INDEPENDENT_AMBULATORY_CARE_PROVIDER_SITE_OTHER): Payer: BLUE CROSS/BLUE SHIELD | Admitting: Obstetrics and Gynecology

## 2018-08-11 ENCOUNTER — Other Ambulatory Visit: Payer: Self-pay

## 2018-08-11 VITALS — BP 126/88 | Wt 189.0 lb

## 2018-08-11 DIAGNOSIS — O0992 Supervision of high risk pregnancy, unspecified, second trimester: Secondary | ICD-10-CM

## 2018-08-11 DIAGNOSIS — O99810 Abnormal glucose complicating pregnancy: Secondary | ICD-10-CM

## 2018-08-11 DIAGNOSIS — E7212 Methylenetetrahydrofolate reductase deficiency: Secondary | ICD-10-CM

## 2018-08-11 DIAGNOSIS — O09522 Supervision of elderly multigravida, second trimester: Secondary | ICD-10-CM

## 2018-08-11 DIAGNOSIS — O09299 Supervision of pregnancy with other poor reproductive or obstetric history, unspecified trimester: Secondary | ICD-10-CM

## 2018-08-11 DIAGNOSIS — O10919 Unspecified pre-existing hypertension complicating pregnancy, unspecified trimester: Secondary | ICD-10-CM

## 2018-08-11 DIAGNOSIS — Z3A25 25 weeks gestation of pregnancy: Secondary | ICD-10-CM

## 2018-08-11 DIAGNOSIS — O09529 Supervision of elderly multigravida, unspecified trimester: Secondary | ICD-10-CM

## 2018-08-11 DIAGNOSIS — O99282 Endocrine, nutritional and metabolic diseases complicating pregnancy, second trimester: Secondary | ICD-10-CM

## 2018-08-11 DIAGNOSIS — O99280 Endocrine, nutritional and metabolic diseases complicating pregnancy, unspecified trimester: Secondary | ICD-10-CM

## 2018-08-11 DIAGNOSIS — O10912 Unspecified pre-existing hypertension complicating pregnancy, second trimester: Secondary | ICD-10-CM

## 2018-08-11 DIAGNOSIS — O0990 Supervision of high risk pregnancy, unspecified, unspecified trimester: Secondary | ICD-10-CM

## 2018-08-11 DIAGNOSIS — E039 Hypothyroidism, unspecified: Secondary | ICD-10-CM

## 2018-08-11 DIAGNOSIS — Z1589 Genetic susceptibility to other disease: Secondary | ICD-10-CM

## 2018-08-11 DIAGNOSIS — O09292 Supervision of pregnancy with other poor reproductive or obstetric history, second trimester: Secondary | ICD-10-CM

## 2018-08-11 NOTE — Progress Notes (Signed)
Routine Prenatal Care Visit  Subjective  Anita Massey is a 38 y.o. [id number] at [redacted]w[redacted]d being seen today for ongoing prenatal care.  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  ----------------------------------------------------------------------------------- Patient reports no complaints.   Contractions: Not present. Vag. Bleeding: None.  Movement: Present. Denies leaking of fluid.  ----------------------------------------------------------------------------------- The following portions of the patient's history were reviewed and updated as appropriate: allergies, current medications, past family history, past medical history, past social history, past surgical history and problem list. Problem list updated.   Objective  Blood pressure 126/88, weight 189 lb (85.7 kg), last menstrual period 02/17/2018. Pregravid weight 193 lb (87.5 kg) Total Weight Gain -4 lb (-1.814 kg) Urinalysis:      Fetal Status: Fetal Heart Rate (bpm): 145 Fundal Height: 24 cm Movement: Present     General:  Alert, oriented and cooperative. Patient is in no acute distress.  Skin: Skin is warm and dry. No rash noted.   Cardiovascular: Normal heart rate noted  Respiratory: Normal respiratory effort, no problems with respiration noted  Abdomen: Soft, gravid, appropriate for gestational age. Pain/Pressure: Present     Pelvic:  Cervical exam deferred         Extremities: Normal range of motion.     ental Status: Normal mood and affect. Normal behavior. Normal judgment and thought content.     Assessment   38 y.o. 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[place]) 03/31/2018 by Malachy Mood, MD No   Overview Signed 03/31/2018  9:50 AM by Malachy Mood, MD    [X]  Additional folic acid      History of maternal third degree perineal laceration, currently pregnant 03/31/2018 by Malachy Mood, MD No   Antepartum multigravida of advanced maternal age 43/17/2020 by Malachy Mood, MD No   Overview Addendum 06/04/2018  8:02 PM by Malachy Mood, MD    Clinic [facility] Prenatal Labs  Dating LMP = 11 week [place] Blood type: A positive  Genetic Screen NIPS: Normal Female Antibody: Negative  Anatomic [place] Complete Rubella: Immune Varicella: Immune  GTT Early: 178 3-hr 91, 147, 143, 125  Third trimester:  RPR: NR  Rhogam N/A HBsAg: Negative   TDaP vaccine                       Flu Shot: HIV: Non Reactive (12/05 2216)   Baby Food                                GBS:   Contraception  Pap: 01/21/2018 NIL HPV negative  CBB     CS/VBAC Undecided   Support Person               Gestational age appropriate obstetric precautions including but not limited to vaginal  bleeding, contractions, leaking of fluid and fetal movement were reviewed in detail with the patient.    - 28 week labs (3-hr) and TSH next visit - growth scan next visit - going to [facility] next week discussed COVID  Return in about 3 weeks (around 09/01/2018) for ROB and 3-hr glucose tolerance, growth scan.  Malachy Mood, MD, [facility], [facility] 08/11/2018, 5:24 PM

## 2018-08-11 NOTE — Progress Notes (Signed)
No vb. No lof. Some pelvic pressure.

## 2018-09-03 ENCOUNTER — Other Ambulatory Visit: Payer: Self-pay

## 2018-09-03 ENCOUNTER — Ambulatory Visit (INDEPENDENT_AMBULATORY_CARE_PROVIDER_SITE_OTHER): Payer: BLUE CROSS/BLUE SHIELD

## 2018-09-03 ENCOUNTER — Ambulatory Visit (INDEPENDENT_AMBULATORY_CARE_PROVIDER_SITE_OTHER): Payer: BLUE CROSS/BLUE SHIELD | Admitting: Obstetrics and Gynecology

## 2018-09-03 ENCOUNTER — Other Ambulatory Visit: Payer: BLUE CROSS/BLUE SHIELD

## 2018-09-03 VITALS — BP 136/76 | Wt 188.0 lb

## 2018-09-03 DIAGNOSIS — O09523 Supervision of elderly multigravida, third trimester: Secondary | ICD-10-CM

## 2018-09-03 DIAGNOSIS — O99280 Endocrine, nutritional and metabolic diseases complicating pregnancy, unspecified trimester: Secondary | ICD-10-CM

## 2018-09-03 DIAGNOSIS — Z1589 Genetic susceptibility to other disease: Secondary | ICD-10-CM

## 2018-09-03 DIAGNOSIS — O0990 Supervision of high risk pregnancy, unspecified, unspecified trimester: Secondary | ICD-10-CM

## 2018-09-03 DIAGNOSIS — L309 Dermatitis, unspecified: Secondary | ICD-10-CM

## 2018-09-03 DIAGNOSIS — O99810 Abnormal glucose complicating pregnancy: Secondary | ICD-10-CM

## 2018-09-03 DIAGNOSIS — L500 Allergic urticaria: Secondary | ICD-10-CM

## 2018-09-03 DIAGNOSIS — O09299 Supervision of pregnancy with other poor reproductive or obstetric history, unspecified trimester: Secondary | ICD-10-CM

## 2018-09-03 DIAGNOSIS — O10919 Unspecified pre-existing hypertension complicating pregnancy, unspecified trimester: Secondary | ICD-10-CM

## 2018-09-03 DIAGNOSIS — O09529 Supervision of elderly multigravida, unspecified trimester: Secondary | ICD-10-CM

## 2018-09-03 DIAGNOSIS — Z362 Encounter for other antenatal screening follow-up: Secondary | ICD-10-CM | POA: Diagnosis not present

## 2018-09-03 DIAGNOSIS — Z3A28 28 weeks gestation of pregnancy: Secondary | ICD-10-CM

## 2018-09-03 DIAGNOSIS — O09293 Supervision of pregnancy with other poor reproductive or obstetric history, third trimester: Secondary | ICD-10-CM

## 2018-09-03 DIAGNOSIS — E7212 Methylenetetrahydrofolate reductase deficiency: Secondary | ICD-10-CM

## 2018-09-03 DIAGNOSIS — O10913 Unspecified pre-existing hypertension complicating pregnancy, third trimester: Secondary | ICD-10-CM

## 2018-09-03 DIAGNOSIS — E039 Hypothyroidism, unspecified: Secondary | ICD-10-CM

## 2018-09-03 DIAGNOSIS — O99283 Endocrine, nutritional and metabolic diseases complicating pregnancy, third trimester: Secondary | ICD-10-CM

## 2018-09-03 MED ORDER — LABETALOL HCL 100 MG PO TABS: 100.0000 mg | ORAL_TABLET | Freq: Two times a day (BID) | ORAL | 9 refills | Status: DC

## 2018-09-03 MED ORDER — TRIAMCINOLONE ACETONIDE 0.1 % EX CREA: 1.0000 "application " | TOPICAL_CREAM | Freq: Two times a day (BID) | CUTANEOUS | 1 refills | Status: DC

## 2018-09-03 MED ORDER — LEVOTHYROXINE SODIUM 75 MCG PO TABS: 75.0000 ug | ORAL_TABLET | Freq: Every day | ORAL | 6 refills | Status: DC

## 2018-09-03 NOTE — Progress Notes (Signed)
ROB 28 week labs w/3 hour GTT Ultrasound Refill medication

## 2018-09-03 NOTE — Progress Notes (Signed)
Routine Prenatal Care Visit  Subjective  Anita Massey is a 38 y.o. [id number] at [redacted]w[redacted]d being seen today for ongoing prenatal care.  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  ----------------------------------------------------------------------------------- Patient reports no complaints.  Had developed pruritic rash left wrist breast and under chin Contractions: Not present. Vag. Bleeding: None.  Movement: Present. Denies leaking of fluid.  ----------------------------------------------------------------------------------- The following portions of the patient's history were reviewed and updated as appropriate: allergies, current medications, past family history, past medical history, past social history, past surgical history and problem list. Problem list updated.   Objective  Blood pressure 136/76, weight 188 lb (85.3 kg), last menstrual period 02/17/2018. Pregravid weight 193 lb (87.5 kg) Total Weight Gain -5 lb (-2.268 kg) Urinalysis:      Fetal Status: Fetal Heart Rate (bpm): 140 Fundal Height: 28 cm Movement: Present  Presentation: Complete Breech  General:  Alert, oriented and cooperative. Patient is in no acute distress.  Skin: Skin is warm and dry. No rash noted.   Cardiovascular: Normal heart rate noted  Respiratory: Normal respiratory effort, no problems with respiration noted  Abdomen: Soft, gravid, appropriate for  gestational age. Pain/Pressure: Present     Pelvic:  Cervical exam deferred        Extremities: Normal range of motion.     ental Status: Normal mood and affect. Normal behavior. Normal judgment and thought content.   US Ob Follow Up  Result Date: 09/03/2018 Patient Name: Anita Massey DOB: Oct 23, 1980 MRN: [id number] ULTRASOUND REPORT Location: [facility] Date of Service: 09/03/2018 Indications:growth/afi Findings: Anita Massey intrauterine pregnancy is visualized with FHR at 138 BPM. Biometrics give an (U/S) Gestational age of [redacted]w[redacted]d and an (U/S) EDD of 12/06/2018; this correlates with the clinically established Estimated Date of Delivery: 11/116/20. Fetal presentation is Breech. Placenta: posterior. Grade: 1 AFI: 18.3 cm Growth percentile is 39.9%. EFW: 985 g ( 2 lb 3 oz ) Impression: 1. [redacted]w[redacted]d Viable Singleton Intrauterine pregnancy previously established criteria. 2. Growth is 39.9 %ile.  AFI is 18.3 cm. 3. The femur bone is 4th percentile. 4. The humerus bone is less than 5th percentile. Recommendations: 1.Clinical correlation with the patient's History and Physical Exam. Gweneth Dimitri, RT There is a singleton gestation with normal amniotic fluid volume. The fetal biometry correlates with established dating.  Limited fetal anatomy was performed.The visualized fetal anatomical survey appears within normal limits within the resolution of ultrasound as described above.  It must be noted that a normal ultrasound is unable to rule out fetal aneuploidy.  Malachy Mood, MD, [facility], [facility] 09/03/2018, 10:06 AM     Assessment   38 y.o. 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[place]) 03/31/2018 by Malachy Mood, MD No    Overview Signed 03/31/2018  9:50 AM by Malachy Mood, MD    [  X] Additional folic acid      History of maternal third degree perineal laceration, currently pregnant 03/31/2018 by Malachy Mood, MD No   Antepartum multigravida of advanced maternal age 66/17/2020 by Malachy Mood, MD No   Overview Addendum 08/11/2018  5:24 PM by Malachy Mood, MD    Clinic [facility] Prenatal Labs  Dating LMP = 11 week [place] Blood type: A positive  Genetic Screen NIPS: Normal XY Antibody: Negative  Anatomic [place] Complete Rubella: Immune Varicella: Immune  GTT Early: 178 3-hr 91, 147, 143, 125  Third trimester:  RPR: NR  Rhogam N/A HBsAg: Negative   TDaP vaccine                       Flu Shot: HIV: Non Reactive (12/05 2216)   Baby Food                                GBS:   Contraception  Pap: 01/21/2018 NIL HPV negative  CBB     CS/VBAC Undecided   Support Person               Gestational age appropriate obstetric precautions including but not limited to vaginal bleeding, contractions, leaking of fluid and fetal movement were reviewed in detail with the patient.    Growth scan reviewed  Anesthesia consult last cesarean was done under general anesthesia Rx triamcinolone for eczema Refill labetalol and levothyroxine  Return in about 2 weeks (around 09/17/2018) for ROB.  Malachy Mood, MD, [facility], [facility] 09/03/2018, 10:19 AM

## 2018-09-04 ENCOUNTER — Telehealth: Payer: Self-pay | Admitting: Obstetrics and Gynecology

## 2018-09-04 LAB — CBC
Hematocrit: 35.1 % (ref 34.0–46.6)
Hemoglobin: 11.7 g/dL (ref 11.1–15.9)
MCH: 29.3 pg (ref 26.6–33.0)
MCHC: 33.3 g/dL (ref 31.5–35.7)
MCV: 88 fL (ref 79–97)
Platelets: 333 10*3/uL (ref 150–450)
RBC: 4 x10E6/uL (ref 3.77–5.28)
RDW: 12.7 % (ref 11.7–15.4)
WBC: 12.6 10*3/uL — ABNORMAL HIGH (ref 3.4–10.8)

## 2018-09-04 LAB — RPR: RPR Ser Ql: NONREACTIVE

## 2018-09-04 LAB — GESTATIONAL GLUCOSE TOLERANCE
Glucose, Fasting: 81 mg/dL (ref 65–94)
Glucose, GTT - 1 Hour: 193 mg/dL — ABNORMAL HIGH (ref 65–179)
Glucose, GTT - 2 Hour: 110 mg/dL (ref 65–154)
Glucose, GTT - 3 Hour: 79 mg/dL (ref 65–139)

## 2018-09-04 LAB — THYROID PANEL WITH TSH
Free Thyroxine Index: 1.8 (ref 1.2–4.9)
T3 Uptake Ratio: 15 % — ABNORMAL LOW (ref 24–39)
T4, Total: 12.1 ug/dL — ABNORMAL HIGH (ref 4.5–12.0)
TSH: 1.15 u[IU]/mL (ref 0.450–4.500)

## 2018-09-04 LAB — HIV ANTIBODY (ROUTINE TESTING W REFLEX): HIV Screen 4th Generation wRfx: NONREACTIVE

## 2018-09-04 NOTE — Telephone Encounter (Signed)
Patient is schedule for General Anesthesia on Monday, September 14, 20 at 12:00. Patient to report to medial mall at [facility] then to go to pre admit . Patient aware of date, time, and location.

## 2018-09-04 NOTE — Telephone Encounter (Signed)
Dr. Georgianne Fick has given results. See previous messages

## 2018-09-04 NOTE — Telephone Encounter (Signed)
-----   Message from [place] Lodge sent at 09/04/2018 10:01 AM EDT ----- Regarding: FW: Anesthesia consult Please call Pre-admit [phone] to schedule the anesthesia consult. Let the patient know, and document in a telephone encounter. Thanks. ----- Message ----- From: Malachy Mood, MD Sent: 09/03/2018   6:56 PM EDT To: [place] Lodge Subject: Anesthesia consult                             Needs anesthesia consult last C-section had to be done under general anesthesia   Malachy Mood, MD, [place], [place] 09/03/2018, 6:57 PM

## 2018-09-04 NOTE — Telephone Encounter (Signed)
Patient is calling for labs results. Please advise. 

## 2018-09-18 ENCOUNTER — Other Ambulatory Visit: Payer: Self-pay

## 2018-09-18 ENCOUNTER — Ambulatory Visit (INDEPENDENT_AMBULATORY_CARE_PROVIDER_SITE_OTHER): Payer: BLUE CROSS/BLUE SHIELD | Admitting: Obstetrics and Gynecology

## 2018-09-18 VITALS — BP 120/82 | Wt 186.0 lb

## 2018-09-18 DIAGNOSIS — E7212 Methylenetetrahydrofolate reductase deficiency: Secondary | ICD-10-CM

## 2018-09-18 DIAGNOSIS — O1213 Gestational proteinuria, third trimester: Secondary | ICD-10-CM

## 2018-09-18 DIAGNOSIS — O09299 Supervision of pregnancy with other poor reproductive or obstetric history, unspecified trimester: Secondary | ICD-10-CM

## 2018-09-18 DIAGNOSIS — Z23 Encounter for immunization: Secondary | ICD-10-CM

## 2018-09-18 DIAGNOSIS — O0990 Supervision of high risk pregnancy, unspecified, unspecified trimester: Secondary | ICD-10-CM

## 2018-09-18 DIAGNOSIS — O09529 Supervision of elderly multigravida, unspecified trimester: Secondary | ICD-10-CM

## 2018-09-18 DIAGNOSIS — D682 Hereditary deficiency of other clotting factors: Secondary | ICD-10-CM

## 2018-09-18 DIAGNOSIS — O09293 Supervision of pregnancy with other poor reproductive or obstetric history, third trimester: Secondary | ICD-10-CM

## 2018-09-18 DIAGNOSIS — O10913 Unspecified pre-existing hypertension complicating pregnancy, third trimester: Secondary | ICD-10-CM

## 2018-09-18 DIAGNOSIS — Z1589 Genetic susceptibility to other disease: Secondary | ICD-10-CM

## 2018-09-18 DIAGNOSIS — O09523 Supervision of elderly multigravida, third trimester: Secondary | ICD-10-CM

## 2018-09-18 DIAGNOSIS — O10919 Unspecified pre-existing hypertension complicating pregnancy, unspecified trimester: Secondary | ICD-10-CM

## 2018-09-18 DIAGNOSIS — O0993 Supervision of high risk pregnancy, unspecified, third trimester: Secondary | ICD-10-CM

## 2018-09-18 DIAGNOSIS — Z3A30 30 weeks gestation of pregnancy: Secondary | ICD-10-CM

## 2018-09-18 DIAGNOSIS — O1210 Gestational proteinuria, unspecified trimester: Secondary | ICD-10-CM

## 2018-09-18 LAB — POCT URINALYSIS DIPSTICK OB: Glucose, UA: NEGATIVE

## 2018-09-18 NOTE — Progress Notes (Signed)
Routine Prenatal Care Visit  Subjective  Anita Massey is a 38 y.o. [id number] at [redacted]w[redacted]d being seen today for ongoing prenatal care.  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  ----------------------------------------------------------------------------------- Patient reports no complaints.   Contractions: Not present. Vag. Bleeding: None.  Movement: Present. Denies leaking of fluid.  ----------------------------------------------------------------------------------- The following portions of the patient's history were reviewed and updated as appropriate: allergies, current medications, past family history, past medical history, past social history, past surgical history and problem list. Problem list updated.   Blood pressure 120/82, weight 186 lb (84.4 kg), last menstrual period 02/17/2018.  Pregravid weight 193 lb (87.5 kg) Total Weight Gain -7 lb (-3.175 kg) Urinalysis:      Fetal Status: Fetal Heart Rate (bpm): 145 Fundal Height: 30 cm Movement: Present     General:  Alert, oriented and cooperative. Patient is in no acute distress.  Skin: Skin is warm and dry. No rash noted.   Cardiovascular: Normal heart rate noted  Respiratory: Normal respiratory effort, no problems with respiration noted  Abdomen: Soft, gravid, appropriate for gestational age. Pain/Pressure: Present     Pelvic:  Cervical exam deferred        Extremities:  Normal range of motion.     ental Status: Normal mood and affect. Normal behavior. Normal judgment and thought content.     Assessment   38 y.o. 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[place]) 03/31/2018 by Malachy Mood, MD No   Overview Signed 03/31/2018  9:50 AM by Malachy Mood, MD    [X]  Additional folic acid      History of maternal third degree perineal laceration, currently pregnant 03/31/2018 by Malachy Mood, MD No   Antepartum multigravida of advanced maternal age 89/17/2020 by Malachy Mood, MD No   Overview Addendum 08/11/2018  5:24 PM by Malachy Mood, MD    Clinic [facility] Prenatal Labs  Dating LMP = 11 week [place] Blood type: A positive  Genetic Screen NIPS: Normal XY Antibody: Negative  Anatomic [place] Complete Rubella: Immune Varicella: Immune  GTT Early: 178 3-hr 91, 147, 143, 125  Third trimester:  RPR: NR  Rhogam N/A HBsAg: Negative   TDaP vaccine                       Flu Shot: HIV: Non Reactive (12/05 2216)   Baby Food                                GBS:   Contraception  Pap: 01/21/2018 NIL HPV negative  CBB     CS/VBAC Undecided   Support Person               Gestational age appropriate obstetric precautions including but not limited to vaginal bleeding,  contractions, leaking of fluid and fetal movement were reviewed in detail with the patient.     Return in about 2 weeks (around 10/02/2018) for ROB and growth scan.  Malachy Mood, MD, [facility], [facility] 09/18/2018, 11:27 AM

## 2018-09-18 NOTE — Progress Notes (Signed)
ROB TDAP given 

## 2018-09-19 LAB — COMPREHENSIVE METABOLIC PANEL
ALT: 9 IU/L (ref 0–32)
AST: 10 IU/L (ref 0–40)
Albumin/Globulin Ratio: 1.7 (ref 1.2–2.2)
Albumin: 4 g/dL (ref 3.8–4.8)
Alkaline Phosphatase: 164 IU/L — ABNORMAL HIGH (ref 39–117)
BUN/Creatinine Ratio: 13 (ref 9–23)
BUN: 7 mg/dL (ref 6–20)
Bilirubin Total: <0.2 mg/dL (ref 0.0–1.2)
CO2: 16 mmol/L — ABNORMAL LOW (ref 20–29)
Calcium: 9.9 mg/dL (ref 8.7–10.2)
Chloride: 105 mmol/L (ref 96–106)
Creatinine, Ser: 0.56 mg/dL — ABNORMAL LOW (ref 0.57–1.00)
GFR calc Af Amer: 137 mL/min/{1.73_m2} (ref >59)
GFR calc non Af Amer: 119 mL/min/{1.73_m2} (ref >59)
Globulin, Total: 2.3 g/dL (ref 1.5–4.5)
Glucose: 75 mg/dL (ref 65–99)
Potassium: 4.1 mmol/L (ref 3.5–5.2)
Sodium: 138 mmol/L (ref 134–144)
Total Protein: 6.3 g/dL (ref 6.0–8.5)

## 2018-09-20 LAB — PROTEIN / CREATININE RATIO, URINE
Creatinine, Urine: 351.6 mg/dL
Protein, Ur: 43.6 mg/dL
Protein/Creat Ratio: 124 mg/g creat (ref 0–200)

## 2018-09-28 ENCOUNTER — Other Ambulatory Visit: Payer: Self-pay | Admitting: Obstetrics and Gynecology

## 2018-09-28 ENCOUNTER — Other Ambulatory Visit: Payer: Self-pay

## 2018-09-28 ENCOUNTER — Encounter
Admission: RE | Admit: 2018-09-28 | Discharge: 2018-09-28 | Disposition: A | Discharge status: Home / Self Care | Payer: BLUE CROSS/BLUE SHIELD | Source: Ambulatory Visit | Attending: Anesthesiology | Admitting: Anesthesiology

## 2018-09-28 DIAGNOSIS — D682 Hereditary deficiency of other clotting factors: Secondary | ICD-10-CM

## 2018-09-28 DIAGNOSIS — O0990 Supervision of high risk pregnancy, unspecified, unspecified trimester: Secondary | ICD-10-CM

## 2018-09-28 DIAGNOSIS — Z1589 Genetic susceptibility to other disease: Secondary | ICD-10-CM

## 2018-09-28 DIAGNOSIS — E7212 Methylenetetrahydrofolate reductase deficiency: Secondary | ICD-10-CM

## 2018-09-28 NOTE — Consult Note (Signed)
[facility] Anesthesia Consultation  Anita Massey [id number] DOB: [birth year] DOA: 09/28/2018 PCP: Venia Carbon, MD   Requesting physician: Dr. Georgianne Fick  Date of consultation: 09/28/18 Reason for consultation: bleeding disorder  CHIEF COMPLAINT:  Hx of GA for cesarean delivery  HISTORY OF PRESENT ILLNESS: Anita Massey  is a 38 y.o. female with a known history of factor 2 deficiency. 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 States that she had an epidural for labor analgesia with her first vaginal delivery, was not diagnosed with bleeding disorder at that time. Had general anesthesia for cesarean delivery, reportedly because of her bleeding disorder. Thinks she saw a hematologist when she was having miscarriages. Denies hx of cardiovascular disease. Denies hx of asthma.   PAST MEDICAL HISTORY:   Past Medical History:  Diagnosis Date  . Anemia   . Anxiety state, unspecified   . 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 Clotting disorder ([place])   . GERD (gastroesophageal reflux disease)    OCC  . 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 Hypertension   . Hypothyroidism   . Multiple joint pain 09/04/2015  . Obesity   . Rosacea   . Undiagnosed cardiac murmurs   . Unspecified essential hypertension     PAST SURGICAL HISTORY:  Past Surgical History:  Procedure Laterality Date  . APPENDECTOMY  2009   [facility]  . CESAREAN SECTION  2016  . CHOLECYSTECTOMY N/A 03/20/2016   Procedure: LAPAROSCOPIC CHOLECYSTECTOMY;  Surgeon: Jules Husbands, MD;  Location: [facility] ORS;  Service: General;  Laterality: N/A;  . DILATION AND CURETTAGE OF UTERUS      SOCIAL HISTORY:  Social History   Tobacco Use  . Smoking status: Former Smoker    Packs/day: 0.25    Years: 1.00    Pack years: 0.25    Types: Cigarettes    Quit date: 03/13/1998    Years since quitting: 20.5  . Smokeless tobacco: Never Used   Substance Use Topics  . Alcohol use: No    FAMILY HISTORY:  Family History  Problem Relation Age of Onset  . Mitral valve prolapse Mother   . Arrhythmia Mother   . Hypertension Mother   . Diabetes Mother   . Hypertension Father   . Hyperlipidemia Father   . Heart disease Maternal Grandfather   . Lung cancer Maternal Grandfather   . Cancer Maternal Grandfather   . Diabetes Brother   . Heart disease Maternal Aunt   . Diabetes Maternal Aunt   . Depression Maternal Aunt   . Cancer Maternal Grandmother        ovarian melanoma    DRUG ALLERGIES:  Allergies  Allergen Reactions  . Biaxin [Clarithromycin] Hives  . Toradol [Ketorolac Tromethamine]     Chest pain   . Prednisone Anxiety and Palpitations    "Makes me feel crazy"  . Morphine And Related     Chest Pain   . Tape Rash    Paper tape is fine    REVIEW OF SYSTEMS:   RESPIRATORY: No cough, shortness of breath, wheezing.  CARDIOVASCULAR: No chest pain, orthopnea, edema.  HEMATOLOGY: No anemia, easy bruising or bleeding SKIN: No rash or lesion. NEUROLOGIC: No tingling, numbness, weakness.  PSYCHIATRY: No anxiety or depression.   MEDICATIONS AT HOME:  Prior to Admission medications   Medication Sig Start Date End Date Taking? Authorizing Provider  aspirin EC 81 MG tablet Take 1 tablet (81  mg total) by mouth daily. 03/31/18 03/31/19  Malachy Mood, MD  Doxylamine-Pyridoxine (DICLEGIS) 10-10 MG TBEC Take 2 tablets by mouth at bedtime. If symptoms persist, add one tablet in the morning and one in the afternoon 05/13/18   Malachy Mood, MD  folic acid (FOLVITE) 1 MG tablet Take 1 tablet (1 mg total) by mouth daily. 03/31/18   Malachy Mood, MD  labetalol (NORMODYNE) 100 MG tablet Take 1 tablet (100 mg total) by mouth 2 (two) times daily. 09/03/18   Malachy Mood, MD  levothyroxine (SYNTHROID) 75 MCG tablet Take 1 tablet (75 mcg total) by mouth daily before breakfast. 09/03/18   Malachy Mood, MD  nystatin  cream (MYCOSTATIN) Apply 1 application topically 2 (two) times daily. 07/06/18   Malachy Mood, MD  triamcinolone cream (KENALOG) 0.1 % Apply 1 application topically 2 (two) times daily. 09/03/18   Malachy Mood, MD      PHYSICAL EXAMINATION:   VITAL SIGNS: Last menstrual period 02/17/2018.  GENERAL:  38 y.o.-year-old patient no acute distress.  HEENT: Head atraumatic, normocephalic. Oropharynx and nasopharynx clear. MP 2, TM distance >3 cm, normal mouth opening. LUNGS: Normal breath sounds bilaterally, no wheezing, rales,rhonchi. No use of accessory muscles of respiration.  CARDIOVASCULAR: S1, S2 normal. No murmurs, rubs, or gallops.  EXTREMITIES: No pedal edema, cyanosis, or clubbing.  NEUROLOGIC: normal gait PSYCHIATRIC: The patient is alert and oriented x 3.  SKIN: No obvious rash, lesion, or ulcer.    IMPRESSION AND PLAN:   Anita Massey  is a 38 y.o. female presenting with hx of bleeding disorder and general anesthesia for cesarean delivery.   I have not been able to locate any previous hematology recommendations in the patient's medical record. Based on the diagnosis of heterozygosity for factor II deficiency and no hx of excessive bleeding along with normal INR risk of spinal hematoma seems low. I would request that hematology be consulted to evaluate need for any other lab testing as well as their expert opinion on bleeding risk for this patient and confirmation of the diagnosis of heterozygosity.   She would strongly prefer to have spinal anesthesia for her repeat cesarean delivery if possible and if bleeding risk is low this would be the preferred method of anesthesia. I will followup with the patient via phone after hematology consult to confirm anesthetic plan for cesarean delivery.

## 2018-10-05 ENCOUNTER — Ambulatory Visit (INDEPENDENT_AMBULATORY_CARE_PROVIDER_SITE_OTHER): Payer: BLUE CROSS/BLUE SHIELD

## 2018-10-05 ENCOUNTER — Ambulatory Visit (INDEPENDENT_AMBULATORY_CARE_PROVIDER_SITE_OTHER): Payer: BLUE CROSS/BLUE SHIELD | Admitting: Obstetrics and Gynecology

## 2018-10-05 ENCOUNTER — Other Ambulatory Visit: Payer: Self-pay

## 2018-10-05 VITALS — BP 132/84 | Wt 191.0 lb

## 2018-10-05 DIAGNOSIS — O0990 Supervision of high risk pregnancy, unspecified, unspecified trimester: Secondary | ICD-10-CM

## 2018-10-05 DIAGNOSIS — Z3689 Encounter for other specified antenatal screening: Secondary | ICD-10-CM

## 2018-10-05 DIAGNOSIS — O10919 Unspecified pre-existing hypertension complicating pregnancy, unspecified trimester: Secondary | ICD-10-CM

## 2018-10-05 DIAGNOSIS — Z3A32 32 weeks gestation of pregnancy: Secondary | ICD-10-CM

## 2018-10-05 DIAGNOSIS — O09523 Supervision of elderly multigravida, third trimester: Secondary | ICD-10-CM | POA: Diagnosis not present

## 2018-10-05 DIAGNOSIS — O0993 Supervision of high risk pregnancy, unspecified, third trimester: Secondary | ICD-10-CM

## 2018-10-05 DIAGNOSIS — Z1589 Genetic susceptibility to other disease: Secondary | ICD-10-CM

## 2018-10-05 DIAGNOSIS — O10913 Unspecified pre-existing hypertension complicating pregnancy, third trimester: Secondary | ICD-10-CM

## 2018-10-05 DIAGNOSIS — O09529 Supervision of elderly multigravida, unspecified trimester: Secondary | ICD-10-CM

## 2018-10-05 DIAGNOSIS — O09293 Supervision of pregnancy with other poor reproductive or obstetric history, third trimester: Secondary | ICD-10-CM

## 2018-10-05 DIAGNOSIS — O09299 Supervision of pregnancy with other poor reproductive or obstetric history, unspecified trimester: Secondary | ICD-10-CM

## 2018-10-05 DIAGNOSIS — E7212 Methylenetetrahydrofolate reductase deficiency: Secondary | ICD-10-CM

## 2018-10-05 LAB — POCT URINALYSIS DIPSTICK OB
Glucose, UA: NEGATIVE
POC,PROTEIN,UA: NEGATIVE

## 2018-10-05 NOTE — Progress Notes (Signed)
Routine Prenatal Care Visit  Subjective  Anita Massey is a 38 y.o. [id number] at [redacted]w[redacted]d being seen today for ongoing prenatal care.  She is currently monitored for the following issues for this high-risk pregnancy and has Labile hypertension; Palpitations; Rosacea; Allergic urticaria; Panic anxiety syndrome; Adrenal adenoma; Asthmatic bronchitis; IBS (irritable bowel syndrome); Preventative health care; Factor II deficiency ([place]); MTHFR gene mutation ([place]); Chronic hypertension in pregnancy; History of maternal third degree perineal laceration, currently pregnant; Antepartum multigravida of advanced maternal age; Supervision of high risk pregnancy, antepartum; and Hypothyroidism affecting pregnancy, antepartum on their problem list.  ----------------------------------------------------------------------------------- Patient reports no complaints.   Contractions: Not present. Vag. Bleeding: None.  Movement: Present. Denies leaking of fluid.  ----------------------------------------------------------------------------------- The following portions of the patient's history were reviewed and updated as appropriate: allergies, current medications, past family history, past medical history, past social history, past surgical history and problem list. Problem list updated.   Objective  Blood pressure 132/84, weight 191 lb (86.6 kg), last menstrual period 02/17/2018. Pregravid weight 193 lb (87.5 kg) Total Weight Gain -2 lb (-0.907 kg)  Body mass index is 34.93 kg/m.  Urinalysis:      Fetal Status: Fetal Heart Rate (bpm): 150 Fundal Height: 31 cm Movement: Present  Presentation: Vertex  General:  Alert, oriented and cooperative. Patient is in no acute distress.  Skin: Skin is warm and dry. No rash noted.   Cardiovascular: Normal heart rate noted  Respiratory: Normal respiratory effort, no problems with respiration noted  Abdomen: Soft, gravid, appropriate for gestational age. Pain/Pressure:  Present     Pelvic:  Cervical exam deferred        Extremities: Normal range of motion.     ental Status: Normal mood and affect. Normal behavior. Normal judgment and thought content.   US Ob Comp Less 14 Wks  Result Date: 10/05/2018 Patient Name: SABRIAN BADDELEY DOB: 1981-01-07 MRN: [id number] ULTRASOUND REPORT Location: [facility] Date of Service: 10/05/2018 Indications:growth/afi Findings: [name] intrauterine pregnancy is visualized with FHR at 158 BPM. Biometrics give an (U/S) Gestational age of [redacted]w[redacted]d and an (U/S) EDD of 12/06/2018; this correlates with the clinically established Estimated Date of Delivery: 11/24/18. Fetal presentation is Cephalic. Placenta: posterior. Grade: 1 AFI: 21.1 cm Growth percentile is 30%. EFW: 1854 g  ( 4 lb 1 oz ) Impression: 1. [redacted]w[redacted]d Viable Singleton Intrauterine pregnancy previously established criteria. 2. Growth is 30 %ile.  AFI is 21.1 cm. Recommendations: 1.Clinical correlation with the patient's History and Physical Exam. Gweneth Dimitri, RT There is a singleton gestation with normal amniotic fluid volume. The fetal biometry correlates with established dating.  Limited fetal anatomy was performed.The visualized fetal anatomical survey appears within normal limits within the resolution of ultrasound as described above.  It must be noted that a normal ultrasound is unable to rule out fetal aneuploidy.  Malachy Mood, MD, [facility], [facility] 10/05/2018, 9:49 AM   Immunization History  Administered Date(s) Administered  . Tdap 07/14/2013, 09/18/2018     Assessment   38 y.o. 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[place]) 03/31/2018 by Malachy Mood, MD No   Overview Signed  03/31/2018  9:50 AM by Malachy Mood, MD    [X]  Additional  folic acid      History of maternal third degree perineal laceration, currently pregnant 03/31/2018 by Malachy Mood, MD No   Antepartum multigravida of advanced maternal age 23/17/2020 by Malachy Mood, MD No   Overview Addendum 08/11/2018  5:24 PM by Malachy Mood, MD    Clinic [facility] Prenatal Labs  Dating LMP = 11 week [place] Blood type: A positive  Genetic Screen NIPS: Normal XY Antibody: Negative  Anatomic [place] Complete Rubella: Immune Varicella: Immune  GTT Early: 178 3-hr 91, 147, 143, 125  Third trimester: 81, 193, 110, 79 RPR: NR  Rhogam N/A HBsAg: Negative   TDaP vaccine  09/18/2018 Flu Shot: HIV: Non Reactive (12/05 2216)   Baby Food                                GBS:   Contraception  Pap: 01/21/2018 NIL HPV negative  CBB     CS/VBAC Undecided   Support Person Husband Christ              Gestational age appropriate obstetric precautions including but not limited to vaginal bleeding, contractions, leaking of fluid and fetal movement were reviewed in detail with the patient.    - start NST/AFI weekly next visit well controlled CHTN on medication - waiting on hematology consult as to regional anesthesia vs general - scheduled C-section  11/17/2018  Return in about 2 weeks (around 10/19/2018) for ROB, AFI, growth scan.  Malachy Mood, MD, [facility], [facility] 10/05/2018, 10:16 AM

## 2018-10-05 NOTE — Progress Notes (Signed)
ROB  Growth scan 

## 2018-10-06 ENCOUNTER — Other Ambulatory Visit: Payer: Self-pay

## 2018-10-06 ENCOUNTER — Encounter: Payer: Self-pay | Admitting: Oncology

## 2018-10-06 NOTE — Progress Notes (Signed)
Patient pre screened for office appointment, no questions or concerns today. 

## 2018-10-07 ENCOUNTER — Other Ambulatory Visit: Payer: Self-pay

## 2018-10-07 ENCOUNTER — Inpatient Hospital Stay: Payer: BLUE CROSS/BLUE SHIELD | Attending: Oncology | Admitting: Oncology

## 2018-10-07 VITALS — BP 121/79 | HR 102 | Temp 98.7°F | Ht 62.0 in | Wt 189.0 lb

## 2018-10-07 DIAGNOSIS — Z87891 Personal history of nicotine dependence: Secondary | ICD-10-CM | POA: Diagnosis not present

## 2018-10-07 DIAGNOSIS — O26893 Other specified pregnancy related conditions, third trimester: Secondary | ICD-10-CM | POA: Insufficient documentation

## 2018-10-07 DIAGNOSIS — D6852 Prothrombin gene mutation: Secondary | ICD-10-CM | POA: Diagnosis not present

## 2018-10-07 DIAGNOSIS — E039 Hypothyroidism, unspecified: Secondary | ICD-10-CM | POA: Diagnosis not present

## 2018-10-07 DIAGNOSIS — I10 Essential (primary) hypertension: Secondary | ICD-10-CM | POA: Diagnosis not present

## 2018-10-07 DIAGNOSIS — Z7982 Long term (current) use of aspirin: Secondary | ICD-10-CM

## 2018-10-07 DIAGNOSIS — Z79899 Other long term (current) drug therapy: Secondary | ICD-10-CM | POA: Diagnosis not present

## 2018-10-08 NOTE — Progress Notes (Signed)
[facility]  [phone] [phone]  ID: Anita Massey OB: 1980/12/07  MR#: [id number]  [id number]  Patient Care Team: Venia Carbon, MD as PCP - General (Internal Medicine) Tomasita Morrow, MD (Family Medicine)  CHIEF COMPLAINT: Heterozygote for prothrombin gene mutation  INTERVAL HISTORY: Patient is a 38 year old female is last evaluated in clinic greater than 5 years ago who is referred back in the 32nd week of pregnancy there is also heterozygous for prothrombin gene mutation.  She has had 3 miscarriages, but 3 healthy pregnancies.  She has never had a personal history of DVT or blood clot.  She currently feels well and is asymptomatic.  She has no neurologic complaints.  She denies any recent fevers or illnesses.  She has a good appetite and is gaining weight appropriately.  She has no chest pain, shortness of breath, cough, or hemoptysis.  She denies any nausea, vomiting, constipation, or diarrhea.  She has no urinary complaints.  Patient offers no specific complaints today.  REVIEW OF SYSTEMS:   Review of Systems  Constitutional: Negative.  Negative for fever, malaise/fatigue and weight loss.  Respiratory: Negative.  Negative for cough and hemoptysis.   Cardiovascular: Negative.  Negative for chest pain and leg swelling.  Gastrointestinal: Negative.  Negative for abdominal pain.  Genitourinary: Negative.  Negative for dysuria.  Musculoskeletal: Negative.  Negative for back pain.  Skin: Negative.  Negative for rash.  Neurological: Negative.  Negative for dizziness, speech change, focal weakness and headaches.  Psychiatric/Behavioral: Negative.  The patient is not nervous/anxious.     As per HPI. Otherwise, a complete review of systems is negative.  PAST MEDICAL HISTORY: Past Medical History:  Diagnosis Date  . Anemia   . Anxiety state, unspecified   . 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  Clotting disorder ([place])   . GERD (gastroesophageal reflux disease)    OCC  . 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 Hypertension   . Hypothyroidism   . Multiple joint pain 09/04/2015  . Obesity   . Rosacea   . Undiagnosed cardiac murmurs   . Unspecified essential hypertension     PAST SURGICAL HISTORY: Past Surgical History:  Procedure Laterality Date  . APPENDECTOMY  2009   [facility]  . CESAREAN SECTION  2016  . CHOLECYSTECTOMY N/A 03/20/2016   Procedure: LAPAROSCOPIC CHOLECYSTECTOMY;  Surgeon: Jules Husbands, MD;  Location: [facility] ORS;  Service: General;  Laterality: N/A;  . DILATION AND CURETTAGE OF UTERUS      FAMILY HISTORY: Family History  Problem Relation Age of Onset  . Mitral valve prolapse Mother   . Arrhythmia Mother   . Hypertension Mother   . Diabetes Mother   . Hypertension Father   . Hyperlipidemia Father   . Heart disease Maternal Grandfather   . Lung cancer Maternal Grandfather   . Cancer Maternal Grandfather   . Diabetes Brother   . Heart disease Maternal Aunt   . Diabetes Maternal Aunt   . Depression Maternal Aunt   . Cancer Maternal Grandmother        ovarian melanoma    ADVANCED DIRECTIVES (Y/N):  N  HEALTH MAINTENANCE: Social History   Tobacco Use  . Smoking status: Former Smoker    Packs/day: 0.25    Years: 1.00    Pack years: 0.25    Types: Cigarettes    Quit date: 03/13/1998    Years since quitting: 20.5  . Smokeless  tobacco: Never Used  Substance Use Topics  . Alcohol use: No  . Drug use: No     Colonoscopy:  PAP:  Bone density:  Lipid panel:  Allergies  Allergen Reactions  . Biaxin [Clarithromycin] Hives  . Toradol [Ketorolac Tromethamine]     Chest pain   . Prednisone Anxiety and Palpitations    "Makes me feel crazy"  . Morphine And Related     Chest Pain   . Tape Rash    Paper tape is fine    Current Outpatient Medications  Medication Sig Dispense Refill  . aspirin EC 81 MG tablet Take 1 tablet (81 mg total) by  mouth daily. 150 tablet 2  . Doxylamine-Pyridoxine (DICLEGIS) 10-10 MG TBEC Take 2 tablets by mouth at bedtime. If symptoms persist, add one tablet in the morning and one in the afternoon [id number] tablet 5  . folic acid (FOLVITE) 1 MG tablet Take 1 tablet (1 mg total) by mouth daily. 30 tablet 10  . labetalol (NORMODYNE) 100 MG tablet Take 1 tablet (100 mg total) by mouth 2 (two) times daily. 60 tablet 9  . levothyroxine (SYNTHROID) 75 MCG tablet Take 1 tablet (75 mcg total) by mouth daily before breakfast. 30 tablet 6  . nystatin cream (MYCOSTATIN) Apply 1 application topically 2 (two) times daily. 30 g 1  . triamcinolone cream (KENALOG) 0.1 % Apply 1 application topically 2 (two) times daily. 30 g 1   No current facility-administered medications for this visit.     OBJECTIVE: Vitals:   10/07/18 1115  BP: 121/79  Pulse: (!) 102  Temp: 98.7 F (37.1 C)     Body mass index is 34.57 kg/m.    ECOG FS:0 - Asymptomatic  General: Well-developed, well-nourished, no acute distress. Eyes: Pink conjunctiva, anicteric sclera. HEENT: Normocephalic, moist mucous membranes, clear oropharnyx. Lungs: Clear to auscultation bilaterally. Heart: Regular rate and rhythm. No rubs, murmurs, or gallops. Abdomen: Appears appropriate for gestational age. Musculoskeletal: No edema, cyanosis, or clubbing. Neuro: Alert, answering all questions appropriately. Cranial nerves grossly intact. Skin: No rashes or petechiae noted. Psych: Normal affect. Lymphatics: No cervical, calvicular, axillary or inguinal LAD.   LAB RESULTS:  Lab Results  Component Value Date   NA 138 09/18/2018   K 4.1 09/18/2018   CL 105 09/18/2018   CO2 16 (L) 09/18/2018   GLUCOSE 75 09/18/2018   BUN 7 09/18/2018   CREATININE 0.56 (L) 09/18/2018   CALCIUM 9.9 09/18/2018   PROT 6.3 09/18/2018   ALBUMIN 4.0 09/18/2018   AST 10 09/18/2018   ALT 9 09/18/2018   ALKPHOS 164 (H) 09/18/2018   BILITOT <0.2 09/18/2018   GFRNONAA 119  09/18/2018   GFRAA 137 09/18/2018    Lab Results  Component Value Date   WBC 12.6 (H) 09/03/2018   NEUTROABS 8.4 (H) 06/08/2018   HGB 11.7 09/03/2018   HCT 35.1 09/03/2018   MCV 88 09/03/2018   PLT 333 09/03/2018     STUDIES: US Ob Comp Less 14 Wks  Result Date: 10/05/2018 Patient Name: Anita Massey DOB: Aug 30, 1980 MRN: [id number] ULTRASOUND REPORT Location: [facility] Date of Service: 10/05/2018 Indications:growth/afi Findings: [name] intrauterine pregnancy is visualized with FHR at 158 BPM. Biometrics give an (U/S) Gestational age of [redacted]w[redacted]d and an (U/S) EDD of 12/06/2018; this correlates with the clinically established Estimated Date of Delivery: 11/24/18. Fetal presentation is Cephalic. Placenta: posterior. Grade: 1 AFI: 21.1 cm Growth percentile is 30%. EFW: 1854 g  ( 4 lb 1 oz )  Impression: 1. [redacted]w[redacted]d Viable Singleton Intrauterine pregnancy previously established criteria. 2. Growth is 30 %ile.  AFI is 21.1 cm. Recommendations: 1.Clinical correlation with the patient's History and Physical Exam. Gweneth Dimitri, RT There is a singleton gestation with normal amniotic fluid volume. The fetal biometry correlates with established dating.  Limited fetal anatomy was performed.The visualized fetal anatomical survey appears within normal limits within the resolution of ultrasound as described above.  It must be noted that a normal ultrasound is unable to rule out fetal aneuploidy.  Malachy Mood, MD, [facility], [facility] 10/05/2018, 9:49 AM    ASSESSMENT: Heterozygote for prothrombin gene mutation.  PLAN:    1. Heterozygote for prothrombin gene mutation: Patient has never had a personal history of DVT, although has 3 documented miscarriages with 3 normal pregnancies.  She is at low risk for developing DVT, although this is still higher than the general population.  From a hematology standpoint, is okay to proceed with epidural or spinal block as per  anesthesiology.  She does not require anticoagulation, although patient mentioned her OB/GYN may put her on prophylactic Lovenox for 2 to 3 weeks after birth which is a reasonable precaution.  No further interventions are needed.  Please refer patient back if there are any questions or concerns. 2.  MTHFR gene mutation: With normal homocystine levels this is likely clinically insignificant. 3.  Pregnancy: Patient's due date is in approximately 6 to 8 weeks.  Continue follow-up with OB/GYN as scheduled.  I spent a total of 60 minutes face-to-face with the patient of which greater than 50% of the visit was spent in counseling and coordination of care as detailed above.  Patient expressed understanding and was in agreement with this plan. She also understands that She can call clinic at any time with any questions, concerns, or complaints.    Lloyd Huger, MD   10/08/2018 7:27 AM

## 2018-10-13 ENCOUNTER — Other Ambulatory Visit: Payer: Self-pay

## 2018-10-13 ENCOUNTER — Encounter: Payer: Self-pay | Admitting: *Deleted

## 2018-10-13 ENCOUNTER — Observation Stay
Admission: EM | Admit: 2018-10-13 | Discharge: 2018-10-14 | Disposition: A | Discharge status: Home / Self Care | Payer: BLUE CROSS/BLUE SHIELD | Attending: Obstetrics and Gynecology | Admitting: Obstetrics and Gynecology

## 2018-10-13 DIAGNOSIS — Z87891 Personal history of nicotine dependence: Secondary | ICD-10-CM | POA: Diagnosis not present

## 2018-10-13 DIAGNOSIS — O0990 Supervision of high risk pregnancy, unspecified, unspecified trimester: Secondary | ICD-10-CM

## 2018-10-13 DIAGNOSIS — E7212 Methylenetetrahydrofolate reductase deficiency: Secondary | ICD-10-CM

## 2018-10-13 DIAGNOSIS — O99613 Diseases of the digestive system complicating pregnancy, third trimester: Secondary | ICD-10-CM | POA: Insufficient documentation

## 2018-10-13 DIAGNOSIS — Z79890 Hormone replacement therapy (postmenopausal): Secondary | ICD-10-CM | POA: Diagnosis not present

## 2018-10-13 DIAGNOSIS — Z7982 Long term (current) use of aspirin: Secondary | ICD-10-CM | POA: Insufficient documentation

## 2018-10-13 DIAGNOSIS — Z3A34 34 weeks gestation of pregnancy: Secondary | ICD-10-CM | POA: Diagnosis not present

## 2018-10-13 DIAGNOSIS — Z1589 Genetic susceptibility to other disease: Secondary | ICD-10-CM

## 2018-10-13 DIAGNOSIS — K219 Gastro-esophageal reflux disease without esophagitis: Secondary | ICD-10-CM | POA: Insufficient documentation

## 2018-10-13 DIAGNOSIS — Z79899 Other long term (current) drug therapy: Secondary | ICD-10-CM | POA: Insufficient documentation

## 2018-10-13 DIAGNOSIS — O163 Unspecified maternal hypertension, third trimester: Secondary | ICD-10-CM | POA: Diagnosis not present

## 2018-10-13 DIAGNOSIS — O09299 Supervision of pregnancy with other poor reproductive or obstetric history, unspecified trimester: Secondary | ICD-10-CM

## 2018-10-13 DIAGNOSIS — O99283 Endocrine, nutritional and metabolic diseases complicating pregnancy, third trimester: Secondary | ICD-10-CM | POA: Insufficient documentation

## 2018-10-13 DIAGNOSIS — O09529 Supervision of elderly multigravida, unspecified trimester: Secondary | ICD-10-CM

## 2018-10-13 NOTE — OB Triage Note (Signed)
Recvd pt from ED. Pt c/o having diarrhea 4 times today. Pt states she takes labetalol 2x a day and couldn't remember if she took it this evening, so she checked her blood pressure and it was 168/94. Pt states she started to get a headache after taking her blood pressure. Rates headache an 8 out of 10. No vaginal bleeding or LOF. No complaints of contractions.

## 2018-10-14 ENCOUNTER — Observation Stay
Admission: EM | Admit: 2018-10-14 | Discharge: 2018-10-14 | Disposition: A | Discharge status: Home / Self Care | Payer: BLUE CROSS/BLUE SHIELD | Source: Home / Self Care | Admitting: Obstetrics and Gynecology

## 2018-10-14 ENCOUNTER — Other Ambulatory Visit: Payer: Self-pay

## 2018-10-14 ENCOUNTER — Telehealth: Payer: Self-pay | Admitting: Obstetrics and Gynecology

## 2018-10-14 DIAGNOSIS — O99283 Endocrine, nutritional and metabolic diseases complicating pregnancy, third trimester: Secondary | ICD-10-CM | POA: Insufficient documentation

## 2018-10-14 DIAGNOSIS — E669 Obesity, unspecified: Secondary | ICD-10-CM | POA: Insufficient documentation

## 2018-10-14 DIAGNOSIS — Z7982 Long term (current) use of aspirin: Secondary | ICD-10-CM | POA: Insufficient documentation

## 2018-10-14 DIAGNOSIS — Z79890 Hormone replacement therapy (postmenopausal): Secondary | ICD-10-CM | POA: Insufficient documentation

## 2018-10-14 DIAGNOSIS — O09299 Supervision of pregnancy with other poor reproductive or obstetric history, unspecified trimester: Secondary | ICD-10-CM

## 2018-10-14 DIAGNOSIS — E7212 Methylenetetrahydrofolate reductase deficiency: Secondary | ICD-10-CM

## 2018-10-14 DIAGNOSIS — Z87891 Personal history of nicotine dependence: Secondary | ICD-10-CM | POA: Insufficient documentation

## 2018-10-14 DIAGNOSIS — Z79899 Other long term (current) drug therapy: Secondary | ICD-10-CM | POA: Insufficient documentation

## 2018-10-14 DIAGNOSIS — Z3A34 34 weeks gestation of pregnancy: Secondary | ICD-10-CM | POA: Insufficient documentation

## 2018-10-14 DIAGNOSIS — Z1589 Genetic susceptibility to other disease: Secondary | ICD-10-CM

## 2018-10-14 DIAGNOSIS — O10913 Unspecified pre-existing hypertension complicating pregnancy, third trimester: Secondary | ICD-10-CM

## 2018-10-14 DIAGNOSIS — O09529 Supervision of elderly multigravida, unspecified trimester: Secondary | ICD-10-CM

## 2018-10-14 DIAGNOSIS — O09523 Supervision of elderly multigravida, third trimester: Secondary | ICD-10-CM | POA: Insufficient documentation

## 2018-10-14 DIAGNOSIS — K219 Gastro-esophageal reflux disease without esophagitis: Secondary | ICD-10-CM | POA: Insufficient documentation

## 2018-10-14 DIAGNOSIS — E039 Hypothyroidism, unspecified: Secondary | ICD-10-CM | POA: Insufficient documentation

## 2018-10-14 DIAGNOSIS — O99213 Obesity complicating pregnancy, third trimester: Secondary | ICD-10-CM | POA: Insufficient documentation

## 2018-10-14 DIAGNOSIS — O0990 Supervision of high risk pregnancy, unspecified, unspecified trimester: Secondary | ICD-10-CM

## 2018-10-14 DIAGNOSIS — O163 Unspecified maternal hypertension, third trimester: Secondary | ICD-10-CM | POA: Diagnosis not present

## 2018-10-14 DIAGNOSIS — O99613 Diseases of the digestive system complicating pregnancy, third trimester: Secondary | ICD-10-CM | POA: Insufficient documentation

## 2018-10-14 LAB — COMPREHENSIVE METABOLIC PANEL
ALT: 10 U/L (ref 0–44)
ALT: 11 U/L (ref 0–44)
AST: 14 U/L — ABNORMAL LOW (ref 15–41)
AST: 17 U/L (ref 15–41)
Albumin: 2.7 g/dL — ABNORMAL LOW (ref 3.5–5.0)
Albumin: 2.8 g/dL — ABNORMAL LOW (ref 3.5–5.0)
Alkaline Phosphatase: 152 U/L — ABNORMAL HIGH (ref 38–126)
Alkaline Phosphatase: 154 U/L — ABNORMAL HIGH (ref 38–126)
Anion gap: 11 (ref 5–15)
Anion gap: 7 (ref 5–15)
BUN: 6 mg/dL (ref 6–20)
BUN: <5 mg/dL — ABNORMAL LOW (ref 6–20)
CO2: 16 mmol/L — ABNORMAL LOW (ref 22–32)
CO2: 20 mmol/L — ABNORMAL LOW (ref 22–32)
Calcium: 9.5 mg/dL (ref 8.9–10.3)
Calcium: 9.6 mg/dL (ref 8.9–10.3)
Chloride: 110 mmol/L (ref 98–111)
Chloride: 110 mmol/L (ref 98–111)
Creatinine, Ser: 0.48 mg/dL (ref 0.44–1.00)
Creatinine, Ser: 0.51 mg/dL (ref 0.44–1.00)
GFR calc Af Amer: >60 mL/min (ref >60)
GFR calc Af Amer: >60 mL/min (ref >60)
GFR calc non Af Amer: >60 mL/min (ref >60)
GFR calc non Af Amer: >60 mL/min (ref >60)
Glucose, Bld: 115 mg/dL — ABNORMAL HIGH (ref 70–99)
Glucose, Bld: 138 mg/dL — ABNORMAL HIGH (ref 70–99)
Potassium: 3.5 mmol/L (ref 3.5–5.1)
Potassium: 3.8 mmol/L (ref 3.5–5.1)
Sodium: 137 mmol/L (ref 135–145)
Sodium: 137 mmol/L (ref 135–145)
Total Bilirubin: 0.4 mg/dL (ref 0.3–1.2)
Total Bilirubin: 0.4 mg/dL (ref 0.3–1.2)
Total Protein: 6.4 g/dL — ABNORMAL LOW (ref 6.5–8.1)
Total Protein: 6.5 g/dL (ref 6.5–8.1)

## 2018-10-14 LAB — PROTEIN / CREATININE RATIO, URINE
Creatinine, Urine: 35 mg/dL
Creatinine, Urine: 16 mg/dL
Total Protein, Urine: <6 mg/dL
Total Protein, Urine: <6 mg/dL

## 2018-10-14 LAB — CBC
HCT: 34.7 % — ABNORMAL LOW (ref 36.0–46.0)
HCT: 35.1 % — ABNORMAL LOW (ref 36.0–46.0)
Hemoglobin: 11.8 g/dL — ABNORMAL LOW (ref 12.0–15.0)
Hemoglobin: 11.8 g/dL — ABNORMAL LOW (ref 12.0–15.0)
MCH: 29.2 pg (ref 26.0–34.0)
MCH: 29.3 pg (ref 26.0–34.0)
MCHC: 33.6 g/dL (ref 30.0–36.0)
MCHC: 34 g/dL (ref 30.0–36.0)
MCV: 86.1 fL (ref 80.0–100.0)
MCV: 86.9 fL (ref 80.0–100.0)
Platelets: 294 10*3/uL (ref 150–400)
Platelets: 302 10*3/uL (ref 150–400)
RBC: 4.03 MIL/uL (ref 3.87–5.11)
RBC: 4.04 MIL/uL (ref 3.87–5.11)
RDW: 12.7 % (ref 11.5–15.5)
RDW: 12.7 % (ref 11.5–15.5)
WBC: 13.6 10*3/uL — ABNORMAL HIGH (ref 4.0–10.5)
WBC: 13.7 10*3/uL — ABNORMAL HIGH (ref 4.0–10.5)
nRBC: 0 % (ref 0.0–0.2)
nRBC: 0 % (ref 0.0–0.2)

## 2018-10-14 LAB — URINE DRUG SCREEN, QUALITATIVE (ARMC ONLY)
Amphetamines, Ur Screen: NOT DETECTED
Barbiturates, Ur Screen: NOT DETECTED
Benzodiazepine, Ur Scrn: NOT DETECTED
Cannabinoid 50 Ng, Ur ~~LOC~~: NOT DETECTED
Cocaine Metabolite,Ur ~~LOC~~: NOT DETECTED
MDMA (Ecstasy)Ur Screen: NOT DETECTED
Methadone Scn, Ur: NOT DETECTED
Opiate, Ur Screen: NOT DETECTED
Phencyclidine (PCP) Ur S: NOT DETECTED
Tricyclic, Ur Screen: NOT DETECTED

## 2018-10-14 LAB — TYPE AND SCREEN
ABO/RH(D): A POS
Antibody Screen: NEGATIVE

## 2018-10-14 MED ORDER — ACETAMINOPHEN 500 MG PO TABS
1000.0000 mg | ORAL_TABLET | Freq: Once | ORAL | Status: AC
  Administered 2018-10-14: 1000 mg via ORAL

## 2018-10-14 MED ORDER — PROCHLORPERAZINE MALEATE 10 MG PO TABS
10.0000 mg | ORAL_TABLET | Freq: Four times a day (QID) | ORAL | Status: DC | PRN
  Administered 2018-10-14: 10 mg via ORAL
  Filled 2018-10-14 (×4): qty 1

## 2018-10-14 MED ORDER — LABETALOL HCL 100 MG PO TABS
100.0000 mg | ORAL_TABLET | Freq: Once | ORAL | Status: AC
  Administered 2018-10-14: 100 mg via ORAL
  Filled 2018-10-14: qty 1

## 2018-10-14 MED ORDER — ACETAMINOPHEN 500 MG PO TABS
ORAL_TABLET | ORAL | Status: AC
  Filled 2018-10-14: qty 2

## 2018-10-14 NOTE — Discharge Summary (Signed)
Physician Final Progress Note  Patient ID: Anita Massey MRN: [id number] DOB/AGE: April 10, 1980 38 y.o.  Admit date: 10/14/2018 Admitting provider: Malachy Mood, MD Discharge date: 10/15/2018   Admission Diagnoses: Chronic hypertension in pregnancy, elevated blood pressure, headache  Discharge Diagnoses: Same  History of Present Illness: The patient is a 38 y.o. female [phone] at [redacted]w[redacted]d who presents for elevated blood pressure at home, headaches, and floaters in her vision. She was seen in triage last night for the same complaints, except that floaters were a new symptom. She discharged around 0400 and slept for a short while, then took her morning dose of labetalol at 0815. She had onset of a headache at 1100 with floaters in her vision once. She took 650 mg of Tylenol with some relief. The headache returned later in the afternoon and her home BP readings were 140-150/90s. She declined an office visit in the clinic. No RUQ pain, shortness of breath, chest pain, loss of fluid, contractions, or vaginal bleeding. Baby is moving well.  Review of Systems: Review of systems negative unless otherwise noted in HPI.   Past Medical History:  Diagnosis Date  . Anemia   . Anxiety state, unspecified   . 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 Clotting disorder ([place])   . GERD (gastroesophageal reflux disease)    OCC  . 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 Hypertension   . Hypothyroidism   . Multiple joint pain 09/04/2015  . Obesity   . Rosacea   . Undiagnosed cardiac murmurs   . Unspecified essential hypertension     Past Surgical History:  Procedure Laterality Date  . APPENDECTOMY  2009   [facility]  . CESAREAN SECTION  2016  . CHOLECYSTECTOMY N/A 03/20/2016   Procedure: LAPAROSCOPIC CHOLECYSTECTOMY;  Surgeon: Jules Husbands, MD;  Location: [facility] ORS;  Service: General;  Laterality: N/A;  . DILATION AND CURETTAGE OF UTERUS      No current  facility-administered medications on file prior to encounter.    Current Outpatient Medications on File Prior to Encounter  Medication Sig Dispense Refill  . aspirin EC 81 MG tablet Take 1 tablet (81 mg total) by mouth daily. 150 tablet 2  . Doxylamine-Pyridoxine (DICLEGIS) 10-10 MG TBEC Take 2 tablets by mouth at bedtime. If symptoms persist, add one tablet in the morning and one in the afternoon [id number] tablet 5  . folic acid (FOLVITE) 1 MG tablet Take 1 tablet (1 mg total) by mouth daily. 30 tablet 10  . labetalol (NORMODYNE) 100 MG tablet Take 1 tablet (100 mg total) by mouth 2 (two) times daily. 60 tablet 9  . levothyroxine (SYNTHROID) 75 MCG tablet Take 1 tablet (75 mcg total) by mouth daily before breakfast. 30 tablet 6  . nystatin cream (MYCOSTATIN) Apply 1 application topically 2 (two) times daily. 30 g 1  . triamcinolone cream (KENALOG) 0.1 % Apply 1 application topically 2 (two) times daily. 30 g 1    Allergies  Allergen Reactions  . Biaxin [Clarithromycin] Hives  . Toradol [Ketorolac Tromethamine]     Chest pain   . Prednisone Anxiety and Palpitations    "Makes me feel crazy"  . Morphine And Related     Chest Pain   . Tape Rash    Paper tape is fine    Social History   Socioeconomic History  . Marital status: Married    Spouse name: Not on file  . Number of children: 2  .  Years of education: Not on file  . Highest education level: Not on file  Occupational History  . Occupation: Agricultural engineer  Social Needs  . Financial resource strain: Not on file  . Food insecurity    Worry: Not on file    Inability: Not on file  . Transportation needs    Medical: Not on file    Non-medical: Not on file  Tobacco Use  . Smoking status: Former Smoker    Packs/day: 0.25    Years: 1.00    Pack years: 0.25    Types: Cigarettes    Quit date: 03/13/1998    Years since quitting: 20.6  . Smokeless tobacco: Never Used  Substance and Sexual Activity  . Alcohol use: No  . Drug use: No   . Sexual activity: Yes    Birth control/protection: None  Lifestyle  . Physical activity    Days per week: Not on file    Minutes per session: Not on file  . Stress: Not on file  Relationships  . Social Herbalist on phone: Not on file    Gets together: Not on file    Attends religious service: Not on file    Active member of club or organization: Not on file    Attends meetings of clubs or organizations: Not on file    Relationship status: Not on file  . Intimate partner violence    Fear of current or ex partner: Not on file    Emotionally abused: Not on file    Physically abused: Not on file    Forced sexual activity: Not on file  Other Topics Concern  . Not on file  Social History Narrative           Family history:  Family History  Problem Relation Age of Onset  . Mitral valve prolapse Mother   . Arrhythmia Mother   . Hypertension Mother   . Diabetes Mother   . Hypertension Father   . Hyperlipidemia Father   . Heart disease Maternal Grandfather   . Lung cancer Maternal Grandfather   . Cancer Maternal Grandfather   . Diabetes Brother   . Heart disease Maternal Aunt   . Diabetes Maternal Aunt   . Depression Maternal Aunt   . Cancer Maternal Grandmother        ovarian melanoma     Physical Exam: BP (!) 134/92   Pulse (!) 110   Temp 98.3 F (36.8 C) (Oral)   Resp 18   Ht 5\' 2"  (1.575 m)   Wt 85.7 kg   LMP 02/17/2018 (Approximate)   SpO2 95%   BMI 34.57 kg/m    Gen: NAD CV: Mild tachycardia Pulm: No increased work of breathing Pelvic: deferred Ext: No signs of DVT  Hospital Course: Patient had initial BP reading of 154/88 in triage and continued to have mild range blood pressures, which were stable and did decrease after receiving her evening dose of labetalol. Also gave Compazine for headache, which helped some, but she did not want a prescription for this because of concern for fetal side effects that she read about online. Labs were  repeated and the results were essentially the same as earlier today with PC ratio too low to calculate and normal Hgb, Hct, platelets, and liver enzymes.  Procedures: NST Baseline: 145 bpm Variability: moderate Accelerations: present Decelerations: absent Tocometry: no contractions noted The patient was monitored for 30+ minutes, fetal heart rate tracing was deemed reactive.  Discharge  Condition: good  Disposition: Discharge disposition: 01-Home or Self Care       Diet: Regular diet  Discharge Activity: Activity as tolerated  Discharge Instructions    Discharge activity:  No Restrictions   Complete by: As directed    Discharge diet:  No restrictions   Complete by: As directed    Fetal Kick Count:  Lie on our left side for one hour after a meal, and count the number of times your baby kicks.  If it is less than 5 times, get up, move around and drink some juice.  Repeat the test 30 minutes later.  If it is still less than 5 kicks in an hour, notify your doctor.   Complete by: As directed    LABOR:  When conractions begin, you should start to time them from the beginning of one contraction to the beginning  of the next.  When contractions are 5 - 10 minutes apart or less and have been regular for at least an hour, you should call your health care provider.   Complete by: As directed    No sexual activity restrictions   Complete by: As directed    Notify physician for bleeding from the vagina   Complete by: As directed    Notify physician for blurring of vision or spots before the eyes   Complete by: As directed    Notify physician for chills or fever   Complete by: As directed    Notify physician for fainting spells, "black outs" or loss of consciousness   Complete by: As directed    Notify physician for increase in vaginal discharge   Complete by: As directed    Notify physician for leaking of fluid   Complete by: As directed    Notify physician for pain or burning when  urinating   Complete by: As directed    Notify physician for pelvic pressure (sudden increase)   Complete by: As directed    Notify physician for severe or continued nausea or vomiting   Complete by: As directed    Notify physician for sudden gushing of fluid from the vagina (with or without continued leaking)   Complete by: As directed    Notify physician for sudden, constant, or occasional abdominal pain   Complete by: As directed    Notify physician if baby moving less than usual   Complete by: As directed      Allergies as of 10/14/2018      Reactions   Biaxin [clarithromycin] Hives   Toradol [ketorolac Tromethamine]    Chest pain    Prednisone Anxiety, Palpitations   "Makes me feel crazy"   Morphine And Related    Chest Pain    Tape Rash   Paper tape is fine      Medication List    TAKE these medications   aspirin EC 81 MG tablet Take 1 tablet (81 mg total) by mouth daily.   Doxylamine-Pyridoxine 10-10 MG Tbec Commonly known as: Diclegis Take 2 tablets by mouth at bedtime. If symptoms persist, add one tablet in the morning and one in the afternoon   folic acid 1 MG tablet Commonly known as: FOLVITE Take 1 tablet (1 mg total) by mouth daily.   labetalol 100 MG tablet Commonly known as: NORMODYNE Take 1 tablet (100 mg total) by mouth 2 (two) times daily.   levothyroxine 75 MCG tablet Commonly known as: SYNTHROID Take 1 tablet (75 mcg total) by mouth daily before breakfast.  nystatin cream Commonly known as: MYCOSTATIN Apply 1 application topically 2 (two) times daily.   triamcinolone cream 0.1 % Commonly known as: KENALOG Apply 1 application topically 2 (two) times daily.      After discussion with Dr. Georgianne Fick, patient stable for discharge with no changes to her medication for chronic hypertension at this time. Pre-eclampsia precautions reviewed. May follow up Friday for BP check, next ROB 10/8.  Signed: Rexene Agent, CNM  10/15/2018, 12:12 AM

## 2018-10-14 NOTE — Telephone Encounter (Signed)
Patient is calling with Blood pressure of 145/93. With blurred vision, headache , diarrhea and nausea. Patient states she was seen at the Hospital last night and wants to know what Dr. Georgianne Fick would like her to do. Patient refused appointment opening with Dr. Gilman Schmidt today. Please advise

## 2018-10-14 NOTE — OB Triage Note (Signed)
Pt presents to L&D c/o increased BP. Pt started seeing floaters this morning at 1100, headache rating 8/10. Pt took 100mg  of Labetalol @0800   and  650mg  tyelnol @1200 . Had some relief with headache but it came back later in the afternoon. Pt took BP at home and read 150s/90s. Pt is a [id number] [redacted]w[redacted]d. PT denies LOF or vaginal bleeding. States poitive fetal movement.  Pt denies right upper gastric pain, absent clonus, reflexes +1. External monitors applied and assessing. Initial FHR 155. VS taken, BP 154/88.

## 2018-10-14 NOTE — Telephone Encounter (Signed)
advise

## 2018-10-14 NOTE — Final Progress Note (Signed)
Physician Final Progress Note  Patient ID: Anita Massey MRN: [id number] DOB/AGE: [birth year] 38 y.o.  Admit date: 10/13/2018 Admitting provider: Will Bonnet, MD Discharge date: 10/14/2018   Admission Diagnoses:  1) intrauterine pregnancy at [redacted]w[redacted]d  2) chronic hypertension affecting pregnancy  Discharge Diagnoses:  1) intrauterine pregnancy at [redacted]w[redacted]d  2) chronic hypertension affecting pregnancy - no evidence of superimposed pre-eclampsia   History of Present Illness: The patient is a 38 y.o. female [id number] at [redacted]w[redacted]d who presents for elevated blood pressure reading at home and a new-onset headache. The patient has a history of chronic hypertension for which she takes labetalol 100 mg bid. Last night she couldn't remember whether she'd taken her dose of labetalol. So, she took her blood pressure and states that it was 160s/90s.  Shortly after this she developed a headache.  She has taken nothing for her headache. Prior to arrival to the hospital she did go ahead and take her dose of labetalol. She notes +FM, no LOF, no vaginal bleeding.  She denies contractions. She denies visual changes and RUQ pain.    Past Medical History:  Diagnosis Date  . Anemia   . Anxiety state, unspecified   . 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 Clotting disorder ([place])   . GERD (gastroesophageal reflux disease)    OCC  . 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 Hypertension   . Hypothyroidism   . Multiple joint pain 09/04/2015  . Obesity   . Rosacea   . Undiagnosed cardiac murmurs   . Unspecified essential hypertension     Past Surgical History:  Procedure Laterality Date  . APPENDECTOMY  2009   [facility]  . CESAREAN SECTION  2016  . CHOLECYSTECTOMY N/A 03/20/2016   Procedure: LAPAROSCOPIC CHOLECYSTECTOMY;  Surgeon: Jules Husbands, MD;  Location: [facility] ORS;  Service: General;  Laterality: N/A;  . DILATION AND CURETTAGE OF UTERUS      No current  facility-administered medications on file prior to encounter.    Current Outpatient Medications on File Prior to Encounter  Medication Sig Dispense Refill  . aspirin EC 81 MG tablet Take 1 tablet (81 mg total) by mouth daily. [id number] tablet 2  . folic acid (FOLVITE) 1 MG tablet Take 1 tablet (1 mg total) by mouth daily. 30 tablet 10  . labetalol (NORMODYNE) 100 MG tablet Take 1 tablet (100 mg total) by mouth 2 (two) times daily. 60 tablet 9  . levothyroxine (SYNTHROID) 75 MCG tablet Take 1 tablet (75 mcg total) by mouth daily before breakfast. 30 tablet 6  . Doxylamine-Pyridoxine (DICLEGIS) 10-10 MG TBEC Take 2 tablets by mouth at bedtime. If symptoms persist, add one tablet in the morning and one in the afternoon 100 tablet 5  . nystatin cream (MYCOSTATIN) Apply 1 application topically 2 (two) times daily. 30 g 1  . triamcinolone cream (KENALOG) 0.1 % Apply 1 application topically 2 (two) times daily. 30 g 1    Allergies  Allergen Reactions  . Biaxin [Clarithromycin] Hives  . Toradol [Ketorolac Tromethamine]     Chest pain   . Prednisone Anxiety and Palpitations    "Makes me feel crazy"  . Morphine And Related     Chest Pain   . Tape Rash    Paper tape is fine    Social History   Socioeconomic History  . Marital status: Married    Spouse name: Not on file  . Number of children: 2  .  Years of education: Not on file  . Highest education level: Not on file  Occupational History  . Occupation: Agricultural engineer  Social Needs  . Financial resource strain: Not on file  . Food insecurity    Worry: Not on file    Inability: Not on file  . Transportation needs    Medical: Not on file    Non-medical: Not on file  Tobacco Use  . Smoking status: Former Smoker    Packs/day: 0.25    Years: 1.00    Pack years: 0.25    Types: Cigarettes    Quit date: 03/13/1998    Years since quitting: 20.6  . Smokeless tobacco: Never Used  Substance and Sexual Activity  . Alcohol use: No  . Drug use: No   . Sexual activity: Yes    Birth control/protection: None  Lifestyle  . Physical activity    Days per week: Not on file    Minutes per session: Not on file  . Stress: Not on file  Relationships  . Social Herbalist on phone: Not on file    Gets together: Not on file    Attends religious service: Not on file    Active member of club or organization: Not on file    Attends meetings of clubs or organizations: Not on file    Relationship status: Not on file  . Intimate partner violence    Fear of current or ex partner: Not on file    Emotionally abused: Not on file    Physically abused: Not on file    Forced sexual activity: Not on file  Other Topics Concern  . Not on file  Social History Narrative           Family History  Problem Relation Age of Onset  . Mitral valve prolapse Mother   . Arrhythmia Mother   . Hypertension Mother   . Diabetes Mother   . Hypertension Father   . Hyperlipidemia Father   . Heart disease Maternal Grandfather   . Lung cancer Maternal Grandfather   . Cancer Maternal Grandfather   . Diabetes Brother   . Heart disease Maternal Aunt   . Diabetes Maternal Aunt   . Depression Maternal Aunt   . Cancer Maternal Grandmother        ovarian melanoma     Review of Systems  Constitutional: Negative.   HENT: Negative.   Eyes: Negative.   Respiratory: Negative.   Cardiovascular: Negative.   Gastrointestinal: Negative.   Genitourinary: Negative.   Musculoskeletal: Negative.   Skin: Negative.   Neurological: Positive for headaches. Negative for dizziness, tingling, tremors, speech change, focal weakness, seizures, loss of consciousness and weakness.  Psychiatric/Behavioral: Negative.      Physical Exam: BP 137/74   Pulse 80   Temp 97.9 F (36.6 C) (Oral)   Resp 16   LMP 02/17/2018 (Approximate)   Physical Exam Constitutional:      General: She is not in acute distress.    Appearance: Normal appearance. She is well-developed.   HENT:     Head: Normocephalic and atraumatic.  Eyes:     General: No scleral icterus.    Conjunctiva/sclera: Conjunctivae normal.  Neck:     Musculoskeletal: Normal range of motion and neck supple.  Cardiovascular:     Rate and Rhythm: Normal rate and regular rhythm.     Heart sounds: No murmur. No friction rub. No gallop.   Pulmonary:     Effort: Pulmonary  effort is normal. No respiratory distress.     Breath sounds: Normal breath sounds. No wheezing or rales.  Abdominal:     General: Bowel sounds are normal. There is no distension.     Palpations: Abdomen is soft. There is no mass.     Tenderness: There is no abdominal tenderness. There is no guarding or rebound.  Musculoskeletal: Normal range of motion.  Neurological:     General: No focal deficit present.     Mental Status: She is alert and oriented to person, place, and time.     Cranial Nerves: No cranial nerve deficit.  Skin:    General: Skin is warm and dry.     Findings: No erythema.  Psychiatric:        Mood and Affect: Mood normal.        Behavior: Behavior normal.        Judgment: Judgment normal.     Consults: None  Significant Findings/ Diagnostic Studies:  Lab Results  Component Value Date   WBC 13.7 (H) 10/14/2018   HGB 11.8 (L) 10/14/2018   HCT 35.1 (L) 10/14/2018   PLT 302 10/14/2018   CREATININE 0.51 10/14/2018   ALT 10 10/14/2018   AST 14 (L) 10/14/2018   PROTCRRATIO     Not able to calculate   10/13/2018    Procedures:  NST Baseline FHR: 130 beats/min Variability: moderate Accelerations: present Decelerations: absent Tocometry: quiet  Interpretation:  INDICATIONS: chronic hypertension RESULTS:  A NST procedure was performed with FHR monitoring and a normal baseline established, appropriate time of 20-40 minutes of evaluation, and accels >2 seen w 15x15 characteristics.  Results show a REACTIVE NST.    Hospital Course: The patient was admitted to Labor and Delivery Triage for  observation. The longer the patient was in triage her blood pressures began to normalize.  Once her labs returned as normal, she was given a dose of Tylenol for her headache.  Eventually, her headache went away.  With normal--range blood pressures and no symptoms, a reactive fetal tracing, and no other concerns, she was discharged home in stable condition. The usual precautions were given for high blood pressure. She was encouraged to check her blood pressure for the next few days to make sure she had normal results with taking her medication.  However, if her headache returned and her blood pressures remained elevated, she was instructed to let us know immediately.  No adjustments to her labetalol dosing were made since her BPs went into the 130s/80s range.  She greatly desired discharge and with the above improvements, she was considered safe for discharge.   Discharge Condition: improved  Disposition: Home  Diet: Regular diet  Discharge Activity: Activity as tolerated   Allergies as of 10/14/2018      Reactions   Biaxin [clarithromycin] Hives   Toradol [ketorolac Tromethamine]    Chest pain    Prednisone Anxiety, Palpitations   "Makes me feel crazy"   Morphine And Related    Chest Pain    Tape Rash   Paper tape is fine      Medication List    ASK your doctor about these medications   aspirin EC 81 MG tablet Take 1 tablet (81 mg total) by mouth daily.   Doxylamine-Pyridoxine 10-10 MG Tbec Commonly known as: Diclegis Take 2 tablets by mouth at bedtime. If symptoms persist, add one tablet in the morning and one in the afternoon   folic acid 1 MG tablet Commonly known as:  FOLVITE Take 1 tablet (1 mg total) by mouth daily.   labetalol 100 MG tablet Commonly known as: NORMODYNE Take 1 tablet (100 mg total) by mouth 2 (two) times daily.   levothyroxine 75 MCG tablet Commonly known as: SYNTHROID Take 1 tablet (75 mcg total) by mouth daily before breakfast.   nystatin  cream Commonly known as: MYCOSTATIN Apply 1 application topically 2 (two) times daily.   triamcinolone cream 0.1 % Commonly known as: KENALOG Apply 1 application topically 2 (two) times daily.        Total time spent taking care of this patient: 40 minutes  Signed: Prentice Docker, MD  10/14/2018, 8:26 AM

## 2018-10-14 NOTE — Discharge Instructions (Signed)
Call to make an appointment for sometime this week or the first of next week for a blood pressure check.  Hypertension During Pregnancy High blood pressure (hypertension) is when the force of blood pumping through the arteries is too strong. Arteries are blood vessels that carry blood from the heart throughout the body. Hypertension during pregnancy can be mild or severe. Severe hypertension during pregnancy (preeclampsia) is a medical emergency that requires prompt evaluation and treatment. Different types of hypertension can happen during pregnancy. These include:  Chronic hypertension. This happens when you had high blood pressure before you became pregnant, and it continues during the pregnancy. Hypertension that develops before you are [redacted] weeks pregnant and continues during the pregnancy is also called chronic hypertension. If you have chronic hypertension, it will not go away after you have your baby. You will need follow-up visits with your health care provider after you have your baby. Your doctor may want you to keep taking medicine for your blood pressure.  Gestational hypertension. This is hypertension that develops after the 20th week of pregnancy. Gestational hypertension usually goes away after you have your baby, but your health care provider will need to monitor your blood pressure to make sure that it is getting better.  Preeclampsia. This is severe hypertension during pregnancy. This can cause serious complications for you and your baby and can also cause complications for you after the delivery of your baby.  Postpartum preeclampsia. You may develop severe hypertension after giving birth. This usually occurs within 48 hours after childbirth but may occur up to 6 weeks after giving birth. This is rare. How does this affect me? Women who have hypertension during pregnancy have a greater chance of developing hypertension later in life or during future pregnancies. In some cases,  hypertension during pregnancy can cause serious complications, such as:  Stroke.  Heart attack.  Injury to other organs, such as kidneys, lungs, or liver.  Preeclampsia.  Convulsions or seizures.  Placental abruption. How does this affect my baby? Hypertension during pregnancy can affect your baby. Your baby may:  Be born early (prematurely).  Not weigh as much as he or she should at birth (low birth weight).  Not tolerate labor well, leading to an unplanned cesarean delivery. What are the risks? There are certain factors that make it more likely for you to develop hypertension during pregnancy. These include:  Having hypertension during a previous pregnancy.  Being overweight.  Being age 62 or older.  Being pregnant for the first time.  Being pregnant with more than one baby.  Becoming pregnant using fertilization methods, such as IVF (in vitro fertilization).  Having other medical problems, such as diabetes, kidney disease, or lupus.  Having a family history of hypertension. What can I do to lower my risk? The exact cause of hypertension during pregnancy is not known. You may be able to lower your risk by:  Maintaining a healthy weight.  Eating a healthy and balanced diet.  Following your health care provider's instructions about treating any long-term conditions that you had before becoming pregnant. It is very important to keep all of your prenatal care appointments. Your health care provider will check your blood pressure and make sure that your pregnancy is progressing as expected. If a problem is found, early treatment can prevent complications. How is this treated? Treatment for hypertension during pregnancy varies depending on the type of hypertension you have and how serious it is.  If you were taking medicine for high  blood pressure before you became pregnant, talk with your health care provider. You may need to change medicine during pregnancy because  some medicines, like ACE inhibitors, may not be considered safe for your baby.  If you have gestational hypertension, your health care provider may order medicine to treat this during pregnancy.  If you are at risk for preeclampsia, your health care provider may recommend that you take a low-dose aspirin during your pregnancy.  If you have severe hypertension, you may need to be hospitalized so you and your baby can be monitored closely. You may also need to be given medicine to lower your blood pressure. This medicine may be given by mouth or through an IV.  In some cases, if your condition gets worse, you may need to deliver your baby early. Follow these instructions at home: Eating and drinking   Drink enough fluid to keep your urine pale yellow.  Avoid caffeine. Lifestyle  Do not use any products that contain nicotine or tobacco, such as cigarettes, e-cigarettes, and chewing tobacco. If you need help quitting, ask your health care provider.  Do not use alcohol or drugs.  Avoid stress as much as possible.  Rest and get plenty of sleep.  Regular exercise can help to reduce your blood pressure. Ask your health care provider what kinds of exercise are best for you. General instructions  Take over-the-counter and prescription medicines only as told by your health care provider.  Keep all prenatal and follow-up visits as told by your health care provider. This is important. Contact a health care provider if:  You have symptoms that your health care provider told you may require more treatment or monitoring, such as: ? Headaches. ? Nausea or vomiting. ? Abdominal pain. ? Dizziness. ? Light-headedness. Get help right away if:  You have: ? Severe abdominal pain that does not get better with treatment. ? A severe headache that does not get better. ? Vomiting that does not get better. ? Sudden, rapid weight gain. ? Sudden swelling in your hands, ankles, or face. ? Vaginal  bleeding. ? Blood in your urine. ? Blurred or double vision. ? Shortness of breath or chest pain. ? Weakness on one side of your body. ? Difficulty speaking.  Your baby is not moving as much as usual. Summary  High blood pressure (hypertension) is when the force of blood pumping through the arteries is too strong.  Hypertension during pregnancy can cause problems for you and your baby.  Treatment for hypertension during pregnancy varies depending on the type of hypertension you have and how serious it is.  Keep all prenatal and follow-up visits as told by your health care provider. This is important. This information is not intended to replace advice given to you by your health care provider. Make sure you discuss any questions you have with your health care provider. Document Released: 09/18/2010 Document Revised: 04/23/2018 Document Reviewed: 01/27/2018 Elsevier Patient Education  2020 [employer].

## 2018-10-14 NOTE — Telephone Encounter (Signed)
Spoke with patient and advise per AMS. Patient is stating she wants to wait for her next schedule appointment due to child care.

## 2018-10-14 NOTE — Discharge Summary (Signed)
See Final Progress Note

## 2018-10-14 NOTE — Telephone Encounter (Signed)
Pt left msg on triage line as well; BP then 142/90 after taking labetalol at 8:15; had floaters then too.  [phone]

## 2018-10-14 NOTE — Telephone Encounter (Signed)
See Gard[facility] Rhyme if she has an opening otherwise re-evaluation at the hospital.

## 2018-10-15 ENCOUNTER — Telehealth: Payer: Self-pay | Admitting: Obstetrics and Gynecology

## 2018-10-15 NOTE — Telephone Encounter (Signed)
Patient is aware of H&P on 11/10/18 @ 10:10am w/ Dr. Georgianne Fick, Pre-admit testing to be scheduled, COVID testing on 11/13/18, and OR on 11/17/18. Patient is aware she will be asked to quarantine after COVID testing.

## 2018-10-19 ENCOUNTER — Other Ambulatory Visit: Payer: BLUE CROSS/BLUE SHIELD

## 2018-10-19 ENCOUNTER — Encounter: Payer: BLUE CROSS/BLUE SHIELD | Admitting: Obstetrics and Gynecology

## 2018-10-22 ENCOUNTER — Other Ambulatory Visit: Payer: Self-pay

## 2018-10-22 ENCOUNTER — Ambulatory Visit (INDEPENDENT_AMBULATORY_CARE_PROVIDER_SITE_OTHER): Payer: BLUE CROSS/BLUE SHIELD | Admitting: Obstetrics and Gynecology

## 2018-10-22 ENCOUNTER — Ambulatory Visit (INDEPENDENT_AMBULATORY_CARE_PROVIDER_SITE_OTHER): Payer: BLUE CROSS/BLUE SHIELD

## 2018-10-22 VITALS — BP 128/80 | Wt 193.0 lb

## 2018-10-22 DIAGNOSIS — O0990 Supervision of high risk pregnancy, unspecified, unspecified trimester: Secondary | ICD-10-CM

## 2018-10-22 DIAGNOSIS — O10919 Unspecified pre-existing hypertension complicating pregnancy, unspecified trimester: Secondary | ICD-10-CM

## 2018-10-22 DIAGNOSIS — O09523 Supervision of elderly multigravida, third trimester: Secondary | ICD-10-CM

## 2018-10-22 DIAGNOSIS — O163 Unspecified maternal hypertension, third trimester: Secondary | ICD-10-CM

## 2018-10-22 DIAGNOSIS — O10913 Unspecified pre-existing hypertension complicating pregnancy, third trimester: Secondary | ICD-10-CM

## 2018-10-22 DIAGNOSIS — O0993 Supervision of high risk pregnancy, unspecified, third trimester: Secondary | ICD-10-CM

## 2018-10-22 DIAGNOSIS — O99283 Endocrine, nutritional and metabolic diseases complicating pregnancy, third trimester: Secondary | ICD-10-CM

## 2018-10-22 DIAGNOSIS — Z3A35 35 weeks gestation of pregnancy: Secondary | ICD-10-CM | POA: Diagnosis not present

## 2018-10-22 DIAGNOSIS — O09293 Supervision of pregnancy with other poor reproductive or obstetric history, third trimester: Secondary | ICD-10-CM

## 2018-10-22 DIAGNOSIS — O09529 Supervision of elderly multigravida, unspecified trimester: Secondary | ICD-10-CM

## 2018-10-22 DIAGNOSIS — E039 Hypothyroidism, unspecified: Secondary | ICD-10-CM

## 2018-10-22 DIAGNOSIS — O09299 Supervision of pregnancy with other poor reproductive or obstetric history, unspecified trimester: Secondary | ICD-10-CM

## 2018-10-22 NOTE — Progress Notes (Signed)
Routine Prenatal Care Visit  Subjective  Anita Massey is a 38 y.o. [id number] at [redacted]w[redacted]d being seen today for ongoing prenatal care.  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  ----------------------------------------------------------------------------------- Patient reports no complaints.   Contractions: Not present. Vag. Bleeding: None.  Movement: Present. Denies leaking of fluid.  ----------------------------------------------------------------------------------- The following portions of the patient's history were reviewed and updated as appropriate: allergies, current medications, past family history, past medical history, past social history, past surgical history and problem list. Problem list updated.   Objective  Blood pressure 128/80, weight 193 lb (87.5 kg), last menstrual period 02/17/2018. Pregravid weight 193 lb (87.5 kg) Total Weight Gain 0 lb (0 kg) Urinalysis:      Fetal Status: Fetal Heart Rate (bpm): 150 Fundal Height: 35 cm Movement: Present     General:  Alert, oriented and cooperative. Patient is in no acute distress.  Skin: Skin is warm and dry. No rash noted.   Cardiovascular: Normal heart rate noted  Respiratory: Normal respiratory effort, no problems with respiration noted  Abdomen: Soft, gravid, appropriate for gestational  age. Pain/Pressure: Absent     Pelvic:  Cervical exam deferred        Extremities: Normal range of motion.     ental Status: Normal mood and affect. Normal behavior. Normal judgment and thought content.   Baseline: 150 Variability: moderate Accelerations: present Decelerations: absent The patient was monitored for 30 minutes, fetal heart rate tracing was deemed reactive, category I tracing,  CPT 59025  US Ob Comp Less 14 Wks  Result Date: 10/05/2018 Patient Name: Anita Massey DOB: 1980-05-01 MRN: [id number] ULTRASOUND REPORT Location: [facility] Date of Service: 10/05/2018 Indications:growth/afi Findings: Anita Massey intrauterine pregnancy is visualized with FHR at 158 BPM. Biometrics give an (U/S) Gestational age of [redacted]w[redacted]d and an (U/S) EDD of 12/06/2018; this correlates with the clinically established Estimated Date of Delivery: 11/24/18. Fetal presentation is Cephalic. Placenta: posterior. Grade: 1 AFI: 21.1 cm Growth percentile is 30%. EFW: 1854 g  ( 4 lb 1 oz ) Impression: 1. [redacted]w[redacted]d Viable Singleton Intrauterine pregnancy previously established criteria. 2. Growth is 30 %ile.  AFI is 21.1 cm. Recommendations: 1.Clinical correlation with the patient's History and Physical Exam. Gweneth Dimitri, RT There is a singleton gestation with normal amniotic fluid volume. The fetal biometry correlates with established dating.  Limited fetal anatomy was performed.The visualized fetal anatomical survey appears within normal limits within the resolution of ultrasound as described above.  It must be noted that a normal ultrasound is unable to rule out fetal aneuploidy.  Malachy Mood, MD, [facility], [facility] 10/05/2018, 9:49 AM   US Ob Limited  Result Date: 10/23/2018 Patient Name: Anita Massey DOB: [birth year] MRN: [id number] ULTRASOUND REPORT Location: [facility] Date of Service: 10/22/2018 Indications:growth/afi Findings: Anita Massey intrauterine pregnancy is  visualized with FHR at 152 BPM. Biometrics give an (U/S) Gestational age of [redacted]w[redacted]d and an (U/S) EDD of 12/09/2018; this correlates with the clinically established Estimated Date of Delivery: 11/24/18. Fetal presentation  is Cephalic. Placenta: posterior. Grade: 1 AFI: 18.7 cm Growth percentile is 17.9%. EFW: 2228 g ( 4 lb 15 oz ) Impression: 1. [redacted]w[redacted]d Viable Singleton Intrauterine pregnancy previously established criteria. 2. Growth is 17.9 %ile.  AFI is 18.7 cm. Recommendations: 1.Clinical correlation with the patient's History and Physical Exam. Gweneth Dimitri, RT There is a singleton gestation with normal amniotic fluid volume. The fetal biometry correlates with established dating.  Limited fetal anatomy was performed.The visualized fetal anatomical survey appears within normal limits within the resolution of ultrasound as described above.  It must be noted that a normal ultrasound is unable to rule out fetal aneuploidy.  Malachy Mood, MD, [facility], [facility] 10/22/2018, 11:29 AM     Assessment   38 y.o. 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[place]) 03/31/2018 by Malachy Mood, MD No   Overview Signed 03/31/2018  9:50 AM by Malachy Mood, MD    [X]  Additional folic acid      History of maternal third degree perineal laceration, currently pregnant 03/31/2018 by Malachy Mood, MD No   Antepartum multigravida of advanced maternal age 35/17/2020 by Malachy Mood, MD No   Overview Addendum 10/13/2018  1:16 PM by Malachy Mood, MD    Clinic [facility] Prenatal Labs  Dating LMP = 11 week [place] Blood type: A positive  Genetic Screen NIPS: Normal XY Antibody: Negative  Anatomic [place] Complete Rubella: Immune  Varicella: Immune  GTT Early: 178 3-hr 91, 147, 143, 125  Third trimester: 81, 193, 110, 79 RPR: NR  Rhogam N/A HBsAg: Negative   TDaP vaccine 09/18/2018  Flu Shot: HIV: Non Reactive (12/05 2216)   Baby Food                                GBS:   Contraception  Pap: 01/21/2018 NIL HPV negative  CBB     CS/VBAC Repeat C-section 11/17/2018   Support Person Husband [name]              Gestational age appropriate obstetric precautions including but not limited to vaginal bleeding, contractions, leaking of fluid and fetal movement were reviewed in detail with the patient.    Return in about 1 week (around 10/29/2018) for ROB/NST/AFI.  Malachy Mood, MD, [facility], [facility] 10/22/2018, 11:31 AM

## 2018-10-22 NOTE — Progress Notes (Signed)
No vb. No lof. U/s today.  

## 2018-10-28 ENCOUNTER — Telehealth: Payer: Self-pay

## 2018-10-28 ENCOUNTER — Other Ambulatory Visit: Payer: Self-pay

## 2018-10-28 ENCOUNTER — Observation Stay
Admission: EM | Admit: 2018-10-28 | Discharge: 2018-10-28 | Disposition: A | Discharge status: Home / Self Care | Payer: BLUE CROSS/BLUE SHIELD | Attending: Obstetrics & Gynecology | Admitting: Obstetrics & Gynecology

## 2018-10-28 DIAGNOSIS — O10913 Unspecified pre-existing hypertension complicating pregnancy, third trimester: Secondary | ICD-10-CM | POA: Insufficient documentation

## 2018-10-28 DIAGNOSIS — O09523 Supervision of elderly multigravida, third trimester: Secondary | ICD-10-CM | POA: Diagnosis not present

## 2018-10-28 DIAGNOSIS — O99283 Endocrine, nutritional and metabolic diseases complicating pregnancy, third trimester: Secondary | ICD-10-CM | POA: Insufficient documentation

## 2018-10-28 DIAGNOSIS — O4703 False labor before 37 completed weeks of gestation, third trimester: Secondary | ICD-10-CM | POA: Diagnosis not present

## 2018-10-28 DIAGNOSIS — K219 Gastro-esophageal reflux disease without esophagitis: Secondary | ICD-10-CM | POA: Insufficient documentation

## 2018-10-28 DIAGNOSIS — Z8249 Family history of ischemic heart disease and other diseases of the circulatory system: Secondary | ICD-10-CM | POA: Insufficient documentation

## 2018-10-28 DIAGNOSIS — Z79899 Other long term (current) drug therapy: Secondary | ICD-10-CM | POA: Diagnosis not present

## 2018-10-28 DIAGNOSIS — Z7982 Long term (current) use of aspirin: Secondary | ICD-10-CM | POA: Insufficient documentation

## 2018-10-28 DIAGNOSIS — O09529 Supervision of elderly multigravida, unspecified trimester: Secondary | ICD-10-CM

## 2018-10-28 DIAGNOSIS — E039 Hypothyroidism, unspecified: Secondary | ICD-10-CM | POA: Diagnosis not present

## 2018-10-28 DIAGNOSIS — Z79890 Hormone replacement therapy (postmenopausal): Secondary | ICD-10-CM | POA: Insufficient documentation

## 2018-10-28 DIAGNOSIS — O99613 Diseases of the digestive system complicating pregnancy, third trimester: Secondary | ICD-10-CM | POA: Diagnosis not present

## 2018-10-28 DIAGNOSIS — O0990 Supervision of high risk pregnancy, unspecified, unspecified trimester: Secondary | ICD-10-CM

## 2018-10-28 DIAGNOSIS — Z3A36 36 weeks gestation of pregnancy: Secondary | ICD-10-CM

## 2018-10-28 DIAGNOSIS — O09299 Supervision of pregnancy with other poor reproductive or obstetric history, unspecified trimester: Secondary | ICD-10-CM

## 2018-10-28 DIAGNOSIS — Z87891 Personal history of nicotine dependence: Secondary | ICD-10-CM | POA: Insufficient documentation

## 2018-10-28 DIAGNOSIS — E7212 Methylenetetrahydrofolate reductase deficiency: Secondary | ICD-10-CM

## 2018-10-28 DIAGNOSIS — E860 Dehydration: Secondary | ICD-10-CM | POA: Diagnosis not present

## 2018-10-28 DIAGNOSIS — Z1589 Genetic susceptibility to other disease: Secondary | ICD-10-CM

## 2018-10-28 LAB — PROTEIN / CREATININE RATIO, URINE
Creatinine, Urine: 275 mg/dL
Protein Creatinine Ratio: 0.28 mg/mg{Cre} — ABNORMAL HIGH (ref 0.00–0.15)
Total Protein, Urine: 78 mg/dL

## 2018-10-28 LAB — URINALYSIS, COMPLETE (UACMP) WITH MICROSCOPIC
Bacteria, UA: NONE SEEN
Glucose, UA: NEGATIVE mg/dL
Ketones, ur: 5 mg/dL — AB
Leukocytes,Ua: NEGATIVE
Nitrite: NEGATIVE
Protein, ur: 100 mg/dL — AB
Specific Gravity, Urine: 1.028 (ref 1.005–1.030)
pH: 5 (ref 5.0–8.0)

## 2018-10-28 NOTE — Telephone Encounter (Signed)
Spoke w/pt. She was having lower left side back pain she noticed while cooking dinner after about 30-40 minutes. It has calmed down since she is sitting down. She also noticed her stomach tightening, which she feels like is braxton hicks. Reviewed guidelines of reporting to L&D if ctx 5 minutes apart consistently for 30 minutes or more than 4 per hour. She has apt for u/s tomorrow and f/u w/provider on Friday. Advised if only one sided, LBP, can check urine while here tomorrow for U/S to be r/o UTI.

## 2018-10-28 NOTE — Discharge Summary (Addendum)
Physician Final Progress Note  Patient ID: Anita Massey MRN: [id number] DOB/AGE: 1980-08-04 38 y.o.  Admit date: 10/28/2018 Admitting provider: Gae Dry, MD Discharge date: 10/28/2018   Admission Diagnoses: contractions  Discharge Diagnoses:  Active Problems:   Indication for care in labor and delivery, antepartum IUP at 36 weeks Reactive NST Not in labor Dehydration  History of Present Illness: The patient is a 38 y.o. female [phone] at [redacted]w[redacted]d who presents for contractions she began having late afternoon and while she was making dinner. She reports the contractions occurred every few minutes. She drank some water and took a shower and continued to have contractions.  She reports good fetal movement. She denies leakage of fluid or vaginal bleeding.  She thinks she may be dehydrated since her urine has been dark in color.  She denies any headache, visual changes or epigastric pain.   Patient was admitted for observation, placed on monitors, labs done, cervical exam.  Patient asked to be discharged to home given no cervical dilation or elevated blood pressure. This was prior to [facility] ratio result. Will notify Dr Gilman Schmidt who sees her in the office on Friday this week of elevated Protein Creatinine ratio.   Past Medical History:  Diagnosis Date  . Anemia   . Anxiety state, unspecified   . 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 Clotting disorder ([place])   . GERD (gastroesophageal reflux disease)    OCC  . 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 Hypertension   . Hypothyroidism   . Multiple joint pain 09/04/2015  . Obesity   . Rosacea   . Undiagnosed cardiac murmurs   . Unspecified essential hypertension     Past Surgical History:  Procedure Laterality Date  . APPENDECTOMY  2009   [facility]  . CESAREAN SECTION  2016  . CHOLECYSTECTOMY N/A 03/20/2016   Procedure: LAPAROSCOPIC CHOLECYSTECTOMY;  Surgeon: Jules Husbands, MD;  Location: [facility] ORS;   Service: General;  Laterality: N/A;  . DILATION AND CURETTAGE OF UTERUS      No current facility-administered medications on file prior to encounter.    Current Outpatient Medications on File Prior to Encounter  Medication Sig Dispense Refill  . aspirin EC 81 MG tablet Take 1 tablet (81 mg total) by mouth daily. [id number] tablet 2  . folic acid (FOLVITE) 1 MG tablet Take 1 tablet (1 mg total) by mouth daily. 30 tablet 10  . labetalol (NORMODYNE) 100 MG tablet Take 1 tablet (100 mg total) by mouth 2 (two) times daily. 60 tablet 9  . levothyroxine (SYNTHROID) 75 MCG tablet Take 1 tablet (75 mcg total) by mouth daily before breakfast. 30 tablet 6  . Doxylamine-Pyridoxine (DICLEGIS) 10-10 MG TBEC Take 2 tablets by mouth at bedtime. If symptoms persist, add one tablet in the morning and one in the afternoon (Patient not taking: Reported on 10/28/2018) 100 tablet 5  . nystatin cream (MYCOSTATIN) Apply 1 application topically 2 (two) times daily. (Patient not taking: Reported on 10/28/2018) 30 g 1  . triamcinolone cream (KENALOG) 0.1 % Apply 1 application topically 2 (two) times daily. (Patient not taking: Reported on 10/28/2018) 30 g 1    Allergies  Allergen Reactions  . Biaxin [Clarithromycin] Hives  . Toradol [Ketorolac Tromethamine]     Chest pain   . Prednisone Anxiety and Palpitations    "Makes me feel crazy"  . Morphine And Related     Chest Pain   .  Tape Rash    Paper tape is fine    Social History   Socioeconomic History  . Marital status: Married    Spouse name: Not on file  . Number of children: 2  . Years of education: Not on file  . Highest education level: Not on file  Occupational History  . Occupation: Agricultural engineer  Social Needs  . Financial resource strain: Not on file  . Food insecurity    Worry: Not on file    Inability: Not on file  . Transportation needs    Medical: Not on file    Non-medical: Not on file  Tobacco Use  . Smoking status: Former Smoker     Packs/day: 0.25    Years: 1.00    Pack years: 0.25    Types: Cigarettes    Quit date: 03/13/1998    Years since quitting: 20.6  . Smokeless tobacco: Never Used  Substance and Sexual Activity  . Alcohol use: No  . Drug use: No  . Sexual activity: Not Currently    Birth control/protection: None    Comment: undecided  Lifestyle  . Physical activity    Days per week: Not on file    Minutes per session: Not on file  . Stress: Not on file  Relationships  . Social Herbalist on phone: Not on file    Gets together: Not on file    Attends religious service: Not on file    Active member of club or organization: Not on file    Attends meetings of clubs or organizations: Not on file    Relationship status: Not on file  . Intimate partner violence    Fear of current or ex partner: Not on file    Emotionally abused: Not on file    Physically abused: Not on file    Forced sexual activity: Not on file  Other Topics Concern  . Not on file  Social History Narrative           Family History  Problem Relation Age of Onset  . Mitral valve prolapse Mother   . Arrhythmia Mother   . Hypertension Mother   . Diabetes Mother   . Hypertension Father   . Hyperlipidemia Father   . Heart disease Maternal Grandfather   . Lung cancer Maternal Grandfather   . Cancer Maternal Grandfather   . Diabetes Brother   . Heart disease Maternal Aunt   . Diabetes Maternal Aunt   . Depression Maternal Aunt   . Cancer Maternal Grandmother        ovarian melanoma     Review of Systems  Constitutional: Negative.   HENT: Negative.   Eyes: Negative.   Respiratory: Negative.   Cardiovascular: Negative.   Gastrointestinal:       Contractions   Genitourinary: Negative.   Musculoskeletal: Negative.   Skin: Negative.   Neurological: Negative.   Endo/Heme/Allergies: Negative.   Psychiatric/Behavioral: Negative.      Physical Exam: BP 138/81 (BP Location: Left Arm)   Pulse 84   Temp 98.1 F  (36.7 C) (Oral)   Resp 18   Ht 5\' 2"  (1.575 m)   Wt 87.1 kg   LMP 02/17/2018 (Approximate)   BMI 35.12 kg/m   Constitutional: Well nourished, well developed female in no acute distress.  HEENT: normal Skin: Warm and dry.  Cardiovascular: Regular rate and rhythm.   Extremity: trace edema  Respiratory: Clear to auscultation bilateral. Normal respiratory effort Abdomen: FHT present  Back: no CVAT Neuro: DTRs 2+, Cranial nerves grossly intact Psych: Alert and Oriented x3. No memory deficits. Normal mood and affect.  MS: normal gait, normal bilateral lower extremity ROM/strength/stability.  Pelvic exam: per RN B. Darnell Closed and thick  Fetal well being: 155 bpm, moderate variability, +accelerations, -decelerations Toco: rare, irregular contraction   Consults: None  Significant Findings/ Diagnostic Studies: labs:   Results for Anita, Massey (MRN [id number]) as of 10/28/2018 19:52  Ref. Range 10/28/2018 19:19  Appearance Latest Ref Range: CLEAR  CLOUDY (A)  Bilirubin Urine Latest Ref Range: NEGATIVE  SMALL (A)  Color, Urine Latest Ref Range: YELLOW  AMBER (A)  Glucose, UA Latest Ref Range: NEGATIVE mg/dL NEGATIVE  Hgb urine dipstick Latest Ref Range: NEGATIVE  SMALL (A)  Ketones, ur Latest Ref Range: NEGATIVE mg/dL 5 (A)  Leukocytes,Ua Latest Ref Range: NEGATIVE  NEGATIVE  Nitrite Latest Ref Range: NEGATIVE  NEGATIVE  pH Latest Ref Range: 5.0 - 8.0  5.0  Protein Latest Ref Range: NEGATIVE mg/dL 100 (A)  Specific Gravity, Urine Latest Ref Range: 1.005 - 1.030  1.028  Bacteria, UA Latest Ref Range: NONE SEEN  NONE SEEN  Hyaline Casts, UA Unknown PRESENT  Mucus Unknown PRESENT  RBC / HPF Latest Ref Range: 0 - 5 RBC/hpf 11-20  Squamous Epithelial / LPF Latest Ref Range: 0 - 5  11-20  WBC, UA Latest Ref Range: 0 - 5 WBC/hpf 6-10  Results for Anita, Massey (MRN [id number]) as of 10/28/2018 22:34  Ref. Range 10/28/2018 19:19  Total Protein, Urine Latest Units: mg/dL 78   Protein Creatinine Ratio Latest Ref Range: 0.00 - 0.15 mg/mgCre 0.28 (H)  Creatinine, Urine Latest Units: mg/dL 275    Procedures: NST  Hospital Course: The patient was admitted to Labor and Delivery Triage for observation.   Discharge Condition: good  Disposition: Discharge disposition: 01-Home or Self Care       Diet: Regular diet, hydration encouraged  Discharge Activity: Activity as tolerated  Discharge Instructions    Discharge activity:  No Restrictions   Complete by: As directed    Discharge diet:  No restrictions   Complete by: As directed    Stay hydrated, drink water until urine is clear to light yellow   Fetal Kick Count:  Lie on our left side for one hour after a meal, and count the number of times your baby kicks.  If it is less than 5 times, get up, move around and drink some juice.  Repeat the test 30 minutes later.  If it is still less than 5 kicks in an hour, notify your doctor.   Complete by: As directed    No sexual activity restrictions   Complete by: As directed    Notify physician for a general feeling that "something is not right"   Complete by: As directed    Notify physician for increase or change in vaginal discharge   Complete by: As directed    Notify physician for intestinal cramps, with or without diarrhea, sometimes described as "gas pain"   Complete by: As directed    Notify physician for leaking of fluid   Complete by: As directed    Notify physician for low, dull backache, unrelieved by heat or Tylenol   Complete by: As directed    Notify physician for menstrual like cramps   Complete by: As directed    Notify physician for pelvic pressure   Complete by: As directed    Notify physician for  uterine contractions.  These may be painless and feel like the uterus is tightening or the baby is  "balling up"   Complete by: As directed    Notify physician for vaginal bleeding   Complete by: As directed    PRETERM LABOR:  Includes any of the  follwing symptoms that occur between 20 - [redacted] weeks gestation.  If these symptoms are not stopped, preterm labor can result in preterm delivery, placing your baby at risk   Complete by: As directed      Allergies as of 10/28/2018      Reactions   Biaxin [clarithromycin] Hives   Toradol [ketorolac Tromethamine]    Chest pain    Prednisone Anxiety, Palpitations   "Makes me feel crazy"   Morphine And Related    Chest Pain    Tape Rash   Paper tape is fine      Medication List    STOP taking these medications   aspirin EC 81 MG tablet   Doxylamine-Pyridoxine 10-10 MG Tbec Commonly known as: Diclegis   nystatin cream Commonly known as: MYCOSTATIN   triamcinolone cream 0.1 % Commonly known as: KENALOG     TAKE these medications   folic acid 1 MG tablet Commonly known as: FOLVITE Take 1 tablet (1 mg total) by mouth daily.   labetalol 100 MG tablet Commonly known as: NORMODYNE Take 1 tablet (100 mg total) by mouth 2 (two) times daily.   levothyroxine 75 MCG tablet Commonly known as: SYNTHROID Take 1 tablet (75 mcg total) by mouth daily before breakfast.      Follow-up Information    [facility]. Go to.   Specialty: Obstetrics and Gynecology Why: scheduled prenatal appointment Contact information: [address] [place] [id number] [phone]          Total time spent taking care of this patient: 15 minutes  Signed: Rod Can, CNM  10/28/2018, 7:58 PM

## 2018-10-28 NOTE — OB Triage Note (Signed)
Pt is a 38y/o at [redacted]w[redacted]d with c/o contractions beginning at 1530 today and got "hot and nauseated". Pt stated she drank 2 glasses of water and took a shower and they continued. Pt rates pain 6/10. Pt states +FM. Pt denies LOF and VB.  Monitors applied and assessing. Initial FHT 155.

## 2018-10-28 NOTE — Telephone Encounter (Signed)
Patient is [phone] and is uncertain if she is having ctx's. She's hurting in her back. She was trying to cook dinner. [id number]

## 2018-10-29 ENCOUNTER — Ambulatory Visit (INDEPENDENT_AMBULATORY_CARE_PROVIDER_SITE_OTHER): Payer: BLUE CROSS/BLUE SHIELD

## 2018-10-29 DIAGNOSIS — E039 Hypothyroidism, unspecified: Secondary | ICD-10-CM

## 2018-10-29 DIAGNOSIS — O0990 Supervision of high risk pregnancy, unspecified, unspecified trimester: Secondary | ICD-10-CM

## 2018-10-29 DIAGNOSIS — O99283 Endocrine, nutritional and metabolic diseases complicating pregnancy, third trimester: Secondary | ICD-10-CM

## 2018-10-29 DIAGNOSIS — O09299 Supervision of pregnancy with other poor reproductive or obstetric history, unspecified trimester: Secondary | ICD-10-CM

## 2018-10-29 DIAGNOSIS — O09523 Supervision of elderly multigravida, third trimester: Secondary | ICD-10-CM

## 2018-10-29 DIAGNOSIS — O10913 Unspecified pre-existing hypertension complicating pregnancy, third trimester: Secondary | ICD-10-CM | POA: Diagnosis not present

## 2018-10-29 DIAGNOSIS — O10919 Unspecified pre-existing hypertension complicating pregnancy, unspecified trimester: Secondary | ICD-10-CM

## 2018-10-29 DIAGNOSIS — O09293 Supervision of pregnancy with other poor reproductive or obstetric history, third trimester: Secondary | ICD-10-CM

## 2018-10-29 DIAGNOSIS — O09529 Supervision of elderly multigravida, unspecified trimester: Secondary | ICD-10-CM

## 2018-10-29 DIAGNOSIS — Z3A36 36 weeks gestation of pregnancy: Secondary | ICD-10-CM

## 2018-10-29 DIAGNOSIS — O99280 Endocrine, nutritional and metabolic diseases complicating pregnancy, unspecified trimester: Secondary | ICD-10-CM

## 2018-10-29 DIAGNOSIS — O163 Unspecified maternal hypertension, third trimester: Secondary | ICD-10-CM

## 2018-10-30 ENCOUNTER — Inpatient Hospital Stay (HOSPITAL_COMMUNITY): Admit: 2018-10-30 | Payer: BLUE CROSS/BLUE SHIELD

## 2018-10-30 ENCOUNTER — Ambulatory Visit (INDEPENDENT_AMBULATORY_CARE_PROVIDER_SITE_OTHER): Payer: BLUE CROSS/BLUE SHIELD | Admitting: Obstetrics and Gynecology

## 2018-10-30 ENCOUNTER — Other Ambulatory Visit: Payer: Self-pay

## 2018-10-30 ENCOUNTER — Encounter: Payer: Self-pay | Admitting: Obstetrics and Gynecology

## 2018-10-30 VITALS — BP 120/80 | Wt 192.0 lb

## 2018-10-30 DIAGNOSIS — O09529 Supervision of elderly multigravida, unspecified trimester: Secondary | ICD-10-CM

## 2018-10-30 DIAGNOSIS — O0990 Supervision of high risk pregnancy, unspecified, unspecified trimester: Secondary | ICD-10-CM

## 2018-10-30 DIAGNOSIS — O09523 Supervision of elderly multigravida, third trimester: Secondary | ICD-10-CM

## 2018-10-30 DIAGNOSIS — O10919 Unspecified pre-existing hypertension complicating pregnancy, unspecified trimester: Secondary | ICD-10-CM

## 2018-10-30 DIAGNOSIS — O10913 Unspecified pre-existing hypertension complicating pregnancy, third trimester: Secondary | ICD-10-CM

## 2018-10-30 DIAGNOSIS — Z3A36 36 weeks gestation of pregnancy: Secondary | ICD-10-CM

## 2018-10-30 DIAGNOSIS — O0993 Supervision of high risk pregnancy, unspecified, third trimester: Secondary | ICD-10-CM | POA: Diagnosis not present

## 2018-10-30 LAB — OB RESULTS CONSOLE GBS: GBS: NEGATIVE

## 2018-10-30 LAB — OB RESULTS CONSOLE GC/CHLAMYDIA: Gonorrhea: NEGATIVE

## 2018-10-30 NOTE — Patient Instructions (Signed)
Preeclampsia and Eclampsia °Preeclampsia is a serious condition that may develop during pregnancy. This condition causes high blood pressure and increased protein in your urine along with other symptoms, such as headaches and vision changes. These symptoms may develop as the condition gets worse. Preeclampsia may occur at 20 weeks of pregnancy or later. °Diagnosing and treating preeclampsia early is very important. If not treated early, it can cause serious problems for you and your baby. One problem it can lead to is eclampsia. Eclampsia is a condition that causes muscle jerking or shaking (convulsions or seizures) and other serious problems for the mother. During pregnancy, delivering your baby may be the best treatment for preeclampsia or eclampsia. For most women, preeclampsia and eclampsia symptoms go away after giving birth. °In rare cases, a woman may develop preeclampsia after giving birth (postpartum preeclampsia). This usually occurs within 48 hours after childbirth but may occur up to 6 weeks after giving birth. °What are the causes? °The cause of preeclampsia is not known. °What increases the risk? °The following risk factors make you more likely to develop preeclampsia: °· Being pregnant for the first time. °· Having had preeclampsia during a past pregnancy. °· Having a family history of preeclampsia. °· Having high blood pressure. °· Being pregnant with more than one baby. °· Being 35 or older. °· Being African-American. °· Having kidney disease or diabetes. °· Having medical conditions such as lupus or blood diseases. °· Being very overweight (obese). °What are the signs or symptoms? °The most common symptoms are: °· Severe headaches. °· Vision problems, such as blurred or double vision. °· Abdominal pain, especially upper abdominal pain. °Other symptoms that may develop as the condition gets worse include: °· Sudden weight gain. °· Sudden swelling of the hands, face, legs, and feet. °· Severe nausea  and vomiting. °· Numbness in the face, arms, legs, and feet. °· Dizziness. °· Urinating less than usual. °· Slurred speech. °· Convulsions or seizures. °How is this diagnosed? °There are no screening tests for preeclampsia. Your health care provider will ask you about symptoms and check for signs of preeclampsia during your prenatal visits. You may also have tests that include: °· Checking your blood pressure. °· Urine tests to check for protein. Your health care provider will check for this at every prenatal visit. °· Blood tests. °· Monitoring your baby's heart rate. °· Ultrasound. °How is this treated? °You and your health care provider will determine the treatment approach that is best for you. Treatment may include: °· Having more frequent prenatal exams to check for signs of preeclampsia, if you have an increased risk for preeclampsia. °· Medicine to lower your blood pressure. °· Staying in the hospital, if your condition is severe. There, treatment will focus on controlling your blood pressure and the amount of fluids in your body (fluid retention). °· Taking medicine (magnesium sulfate) to prevent seizures. This may be given as an injection or through an IV. °· Taking a low-dose aspirin during your pregnancy. °· Delivering your baby early. You may have your labor started with medicine (induced), or you may have a cesarean delivery. °Follow these instructions at home: °Eating and drinking ° °· Drink enough fluid to keep your urine pale yellow. °· Avoid caffeine. °Lifestyle °· Do not use any products that contain nicotine or tobacco, such as cigarettes and e-cigarettes. If you need help quitting, ask your health care provider. °· Do not use alcohol or drugs. °· Avoid stress as much as possible. Rest and get   plenty of sleep. °General instructions °· Take over-the-counter and prescription medicines only as told by your health care provider. °· When lying down, lie on your left side. This keeps pressure off your  major blood vessels. °· When sitting or lying down, raise (elevate) your feet. Try putting some pillows underneath your lower legs. °· Exercise regularly. Ask your health care provider what kinds of exercise are best for you. °· Keep all follow-up and prenatal visits as told by your health care provider. This is important. °How is this prevented? °There is no known way of preventing preeclampsia or eclampsia from developing. However, to lower your risk of complications and detect problems early: °· Get regular prenatal care. Your health care provider may be able to diagnose and treat the condition early. °· Maintain a healthy weight. Ask your health care provider for help managing weight gain during pregnancy. °· Work with your health care provider to manage any long-term (chronic) health conditions you have, such as diabetes or kidney problems. °· You may have tests of your blood pressure and kidney function after giving birth. °· Your health care provider may have you take low-dose aspirin during your next pregnancy. °Contact a health care provider if: °· You have symptoms that your health care provider told you may require more treatment or monitoring, such as: °? Headaches. °? Nausea or vomiting. °? Abdominal pain. °? Dizziness. °? Light-headedness. °Get help right away if: °· You have severe: °? Abdominal pain. °? Headaches that do not get better. °? Dizziness. °? Vision problems. °? Confusion. °? Nausea or vomiting. °· You have any of the following: °? A seizure. °? Sudden, rapid weight gain. °? Sudden swelling in your hands, ankles, or face. °? Trouble moving any part of your body. °? Numbness in any part of your body. °? Trouble speaking. °? Abnormal bleeding. °· You faint. °Summary °· Preeclampsia is a serious condition that may develop during pregnancy. °· This condition causes high blood pressure and increased protein in your urine along with other symptoms, such as headaches and vision  changes. °· Diagnosing and treating preeclampsia early is very important. If not treated early, it can cause serious problems for you and your baby. °· Get help right away if you have symptoms that your health care provider told you to watch for. °This information is not intended to replace advice given to you by your health care provider. Make sure you discuss any questions you have with your health care provider. °Document Released: 12/29/1999 Document Revised: 09/02/2017 Document Reviewed: 08/07/2015 °Elsevier Patient Education © 2020 [employer] Inc. ° °

## 2018-10-30 NOTE — Progress Notes (Signed)
ROB C/o pelvic pressure and pain  GBS/Aptima  Denies lof, no vb Good FM

## 2018-10-30 NOTE — Progress Notes (Signed)
Routine Prenatal Care Visit  Subjective  Anita Massey is a 38 y.o. [id number] at [redacted]w[redacted]d being seen today for ongoing prenatal care.  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  ----------------------------------------------------------------------------------- Patient reports no complaints.   She was seen at the hospital this week. Reports that she has had a headache that comes and goes for several weeks. Her blood pressure today is normal. She is taking her BP medication (Labetalol 100mg ) twice a day.   Contractions: Not present. Vag. Bleeding: None.  Movement: Present. Denies leaking of fluid.  ----------------------------------------------------------------------------------- The following portions of the patient's history were reviewed and updated as appropriate: allergies, current medications, past family history, past medical history, past social history, past surgical history and problem list. Problem list updated.   Objective  Blood pressure 120/80, weight 192 lb (87.1 kg), last menstrual period 02/17/2018. Pregravid weight 193 lb (87.5 kg) Total Weight Gain -1 lb (-0.454 kg) Urinalysis:      Fetal Status: Fetal Heart Rate (bpm): 150   Movement: Present     General:  Alert, oriented and  cooperative. Patient is in no acute distress.  Skin: Skin is warm and dry. No rash noted.   Cardiovascular: Normal heart rate noted  Respiratory: Normal respiratory effort, no problems with respiration noted  Abdomen: Soft, gravid, appropriate for gestational age. Pain/Pressure: Absent     Pelvic:  Cervical exam deferred        Extremities: Normal range of motion.     Mental Status: Normal mood and affect. Normal behavior. Normal judgment and thought content.   NST: 145 bpm baseline, moderate variability, 15x15 accelerations, no decelerations.   Assessment   38 y.o. 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[place]) 03/31/2018 by Malachy Mood, MD No   Overview Signed 03/31/2018  9:50 AM by Malachy Mood, MD    [X]  Additional folic acid      History of maternal third degree perineal laceration, currently pregnant 03/31/2018 by Malachy Mood, MD No   Antepartum multigravida of advanced maternal age 34/17/2020 by Malachy Mood, MD No   Overview Addendum 10/13/2018  1:16 PM by Malachy Mood, MD    Clinic [facility] Prenatal Labs  Dating LMP = 11 week [place] Blood type: A positive  Genetic Screen NIPS: Normal XY Antibody: Negative  Anatomic [place] Complete Rubella: Immune Varicella: Immune  GTT Early: 178 3-hr 91, 147, 143, 125  Third trimester: 81, 193, 110, 79 RPR: NR  Rhogam N/A HBsAg: Negative   TDaP vaccine 09/18/2018  Flu Shot: HIV: Non Reactive (12/05 2216)   [place]  GBS:   Contraception  Pap: 01/21/2018 NIL HPV negative  CBB     CS/VBAC Repeat C-section 11/17/2018   Support Person Husband [name]              Gestational age appropriate obstetric precautions including but not limited to vaginal  bleeding, contractions, leaking of fluid and fetal movement were reviewed in detail with the patient.    Return in about 4 days (around 11/03/2018) for twice a week visits with NSTs ROB with MD- US/ AFI once a week.  Homero Fellers MD [facility] 10/30/2018, 11:34 AM

## 2018-11-03 ENCOUNTER — Other Ambulatory Visit: Payer: Self-pay

## 2018-11-03 ENCOUNTER — Ambulatory Visit (INDEPENDENT_AMBULATORY_CARE_PROVIDER_SITE_OTHER): Payer: BLUE CROSS/BLUE SHIELD | Admitting: Certified Nurse Midwife

## 2018-11-03 VITALS — BP 130/80 | Wt 192.0 lb

## 2018-11-03 DIAGNOSIS — Z3A37 37 weeks gestation of pregnancy: Secondary | ICD-10-CM

## 2018-11-03 DIAGNOSIS — O10913 Unspecified pre-existing hypertension complicating pregnancy, third trimester: Secondary | ICD-10-CM | POA: Diagnosis not present

## 2018-11-03 DIAGNOSIS — O10919 Unspecified pre-existing hypertension complicating pregnancy, unspecified trimester: Secondary | ICD-10-CM

## 2018-11-03 DIAGNOSIS — O1213 Gestational proteinuria, third trimester: Secondary | ICD-10-CM

## 2018-11-03 DIAGNOSIS — O0993 Supervision of high risk pregnancy, unspecified, third trimester: Secondary | ICD-10-CM

## 2018-11-03 DIAGNOSIS — O0990 Supervision of high risk pregnancy, unspecified, unspecified trimester: Secondary | ICD-10-CM

## 2018-11-03 LAB — CULTURE, BETA STREP (GROUP B ONLY): Strep Gp B Culture: NEGATIVE

## 2018-11-03 LAB — POCT URINALYSIS DIPSTICK OB

## 2018-11-03 NOTE — Progress Notes (Signed)
GBS negative.

## 2018-11-03 NOTE — Progress Notes (Signed)
C/o feels VERY week today; pulse has been in 120s-130s;  Urine wasn't retested. rj

## 2018-11-04 LAB — CBC WITH DIFFERENTIAL/PLATELET
Basophils Absolute: 0 10*3/uL (ref 0.0–0.2)
Basos: 0 %
EOS (ABSOLUTE): 0.1 10*3/uL (ref 0.0–0.4)
Eos: 1 %
Hematocrit: 37.7 % (ref 34.0–46.6)
Hemoglobin: 12.6 g/dL (ref 11.1–15.9)
Immature Grans (Abs): 0.1 10*3/uL (ref 0.0–0.1)
Immature Granulocytes: 1 %
Lymphocytes Absolute: 2.4 10*3/uL (ref 0.7–3.1)
Lymphs: 16 %
MCH: 28.8 pg (ref 26.6–33.0)
MCHC: 33.4 g/dL (ref 31.5–35.7)
MCV: 86 fL (ref 79–97)
Monocytes Absolute: 1 10*3/uL — ABNORMAL HIGH (ref 0.1–0.9)
Monocytes: 7 %
Neutrophils Absolute: 11.3 10*3/uL — ABNORMAL HIGH (ref 1.4–7.0)
Neutrophils: 75 %
Platelets: 324 10*3/uL (ref 150–450)
RBC: 4.37 x10E6/uL (ref 3.77–5.28)
RDW: 12.7 % (ref 11.7–15.4)
WBC: 14.8 10*3/uL — ABNORMAL HIGH (ref 3.4–10.8)

## 2018-11-04 LAB — CMP14+EGFR
ALT: 12 IU/L (ref 0–32)
AST: 15 IU/L (ref 0–40)
Albumin/Globulin Ratio: 1.5 (ref 1.2–2.2)
Albumin: 3.7 g/dL — ABNORMAL LOW (ref 3.8–4.8)
Alkaline Phosphatase: 236 IU/L — ABNORMAL HIGH (ref 39–117)
BUN/Creatinine Ratio: 13 (ref 9–23)
BUN: 8 mg/dL (ref 6–20)
Bilirubin Total: 0.2 mg/dL (ref 0.0–1.2)
CO2: 17 mmol/L — ABNORMAL LOW (ref 20–29)
Calcium: 9.8 mg/dL (ref 8.7–10.2)
Chloride: 106 mmol/L (ref 96–106)
Creatinine, Ser: 0.62 mg/dL (ref 0.57–1.00)
GFR calc Af Amer: 132 mL/min/{1.73_m2} (ref >59)
GFR calc non Af Amer: 115 mL/min/{1.73_m2} (ref >59)
Globulin, Total: 2.5 g/dL (ref 1.5–4.5)
Glucose: 56 mg/dL — ABNORMAL LOW (ref 65–99)
Potassium: 4.6 mmol/L (ref 3.5–5.2)
Sodium: 137 mmol/L (ref 134–144)
Total Protein: 6.2 g/dL (ref 6.0–8.5)

## 2018-11-04 LAB — CERVICOVAGINAL ANCILLARY ONLY
Chlamydia: NEGATIVE
Comment: NEGATIVE
Comment: NORMAL
Neisseria Gonorrhea: NEGATIVE

## 2018-11-04 LAB — PROTEIN / CREATININE RATIO, URINE
Creatinine, Urine: 275.7 mg/dL
Protein, Ur: 48.5 mg/dL
Protein/Creat Ratio: 176 mg/g creat (ref 0–200)

## 2018-11-05 NOTE — Progress Notes (Signed)
Released to [employer]

## 2018-11-06 ENCOUNTER — Other Ambulatory Visit: Payer: Self-pay | Admitting: Certified Nurse Midwife

## 2018-11-06 ENCOUNTER — Ambulatory Visit (INDEPENDENT_AMBULATORY_CARE_PROVIDER_SITE_OTHER): Payer: BLUE CROSS/BLUE SHIELD | Admitting: Certified Nurse Midwife

## 2018-11-06 ENCOUNTER — Other Ambulatory Visit (INDEPENDENT_AMBULATORY_CARE_PROVIDER_SITE_OTHER): Payer: BLUE CROSS/BLUE SHIELD

## 2018-11-06 ENCOUNTER — Other Ambulatory Visit: Payer: Self-pay

## 2018-11-06 ENCOUNTER — Telehealth: Payer: Self-pay

## 2018-11-06 VITALS — BP 124/90 | Wt 194.0 lb

## 2018-11-06 DIAGNOSIS — O163 Unspecified maternal hypertension, third trimester: Secondary | ICD-10-CM

## 2018-11-06 DIAGNOSIS — O1213 Gestational proteinuria, third trimester: Secondary | ICD-10-CM

## 2018-11-06 DIAGNOSIS — O10919 Unspecified pre-existing hypertension complicating pregnancy, unspecified trimester: Secondary | ICD-10-CM

## 2018-11-06 DIAGNOSIS — O09529 Supervision of elderly multigravida, unspecified trimester: Secondary | ICD-10-CM

## 2018-11-06 DIAGNOSIS — O403XX0 Polyhydramnios, third trimester, not applicable or unspecified: Secondary | ICD-10-CM

## 2018-11-06 DIAGNOSIS — O10913 Unspecified pre-existing hypertension complicating pregnancy, third trimester: Secondary | ICD-10-CM | POA: Diagnosis not present

## 2018-11-06 DIAGNOSIS — O0993 Supervision of high risk pregnancy, unspecified, third trimester: Secondary | ICD-10-CM

## 2018-11-06 DIAGNOSIS — Z3A37 37 weeks gestation of pregnancy: Secondary | ICD-10-CM

## 2018-11-06 DIAGNOSIS — Z3689 Encounter for other specified antenatal screening: Secondary | ICD-10-CM | POA: Diagnosis not present

## 2018-11-06 DIAGNOSIS — O09523 Supervision of elderly multigravida, third trimester: Secondary | ICD-10-CM

## 2018-11-06 DIAGNOSIS — O0990 Supervision of high risk pregnancy, unspecified, unspecified trimester: Secondary | ICD-10-CM

## 2018-11-06 LAB — POCT URINALYSIS DIPSTICK OB

## 2018-11-06 NOTE — Telephone Encounter (Signed)
Pt calling; BP is still 127/90; should she be concerned?  D/w CLG - asked about her H/A which pt still has.  Pt actually just got home so she just now took the tylenol.  Per CLG pt to lie down for awhile then retake BP.  CLG is more concerned c H/A than BP.  If H/A persists, pt to go to L&D.

## 2018-11-06 NOTE — Progress Notes (Signed)
C/o ck urine for protein. rj

## 2018-11-07 LAB — PROTEIN / CREATININE RATIO, URINE
Creatinine, Urine: 149.9 mg/dL
Protein, Ur: 21.7 mg/dL
Protein/Creat Ratio: 145 mg/g creat (ref 0–200)

## 2018-11-07 LAB — URINE CULTURE

## 2018-11-08 NOTE — Progress Notes (Signed)
HROB at 37wk3d: NST and AFI today. Has a headache today in bilateral parietal area. Has not taken Tylenol yet, but has eaten. No CP, SOB, RUQ pain. Is now thinking she desires permanent birth control. Thinking about BTL with CS. Has not discussed with husband. Hs not signed 30 day papers. Baby active Blood pressure up a little this AM: 130/90, 124/90. Did take labetalol this am. Preeclampsia labs earlier this week were normal. Trace protein today NST reactive with baseline 145 and accelerations to 170s to 180, moderate variability 0000000 cephalic presentation  A: IUP at 37week 3 days. CHTN: Mild range blood pressure today on labetalol 100 mgm BID. Headache today Reactive NST/normal AFI  P: Send PC ratio Recommend going home and taking Tylenol an resting and see if headache resolved. If headache unresolved or worsening-call L&D RTO  Oct 27 for Preop with Dr Georgianne Fick and NST  Dalia Heading, CNM

## 2018-11-09 ENCOUNTER — Other Ambulatory Visit: Payer: Self-pay | Admitting: Certified Nurse Midwife

## 2018-11-09 ENCOUNTER — Telehealth: Payer: Self-pay

## 2018-11-09 MED ORDER — CEPHALEXIN 500 MG PO CAPS: 500.0000 mg | ORAL_CAPSULE | Freq: Three times a day (TID) | ORAL | 0 refills | Status: DC

## 2018-11-09 NOTE — Telephone Encounter (Signed)
Patient inquiring about urine results from 10/20 & repeat on 11/06/2018. [id number]

## 2018-11-09 NOTE — Telephone Encounter (Signed)
Patient called with urine culture and urine PC ratio. Urine culture growing out Enterococcus feacalis and Staph epidermis. 10K to 25K each. ?skin contaminant with Staph epidermis. RX for Keflex 500 tid to cover Enterococcus feacalis. Encouraged increase water intake. Mild elevated BP at home of 143/93 this AM. Has appointment tomorrow for preop. Dalia Heading, CNM

## 2018-11-10 ENCOUNTER — Other Ambulatory Visit: Payer: Self-pay

## 2018-11-10 ENCOUNTER — Encounter: Payer: Self-pay | Admitting: Obstetrics and Gynecology

## 2018-11-10 ENCOUNTER — Ambulatory Visit (INDEPENDENT_AMBULATORY_CARE_PROVIDER_SITE_OTHER): Payer: BLUE CROSS/BLUE SHIELD | Admitting: Obstetrics and Gynecology

## 2018-11-10 VITALS — BP 126/82 | HR 88 | Ht 62.0 in | Wt 192.0 lb

## 2018-11-10 DIAGNOSIS — O09529 Supervision of elderly multigravida, unspecified trimester: Secondary | ICD-10-CM

## 2018-11-10 DIAGNOSIS — E7212 Methylenetetrahydrofolate reductase deficiency: Secondary | ICD-10-CM

## 2018-11-10 DIAGNOSIS — Z1589 Genetic susceptibility to other disease: Secondary | ICD-10-CM

## 2018-11-10 DIAGNOSIS — O09293 Supervision of pregnancy with other poor reproductive or obstetric history, third trimester: Secondary | ICD-10-CM

## 2018-11-10 DIAGNOSIS — O99283 Endocrine, nutritional and metabolic diseases complicating pregnancy, third trimester: Secondary | ICD-10-CM

## 2018-11-10 DIAGNOSIS — O0990 Supervision of high risk pregnancy, unspecified, unspecified trimester: Secondary | ICD-10-CM

## 2018-11-10 DIAGNOSIS — O09523 Supervision of elderly multigravida, third trimester: Secondary | ICD-10-CM

## 2018-11-10 DIAGNOSIS — E039 Hypothyroidism, unspecified: Secondary | ICD-10-CM

## 2018-11-10 DIAGNOSIS — O0993 Supervision of high risk pregnancy, unspecified, third trimester: Secondary | ICD-10-CM

## 2018-11-10 DIAGNOSIS — O10919 Unspecified pre-existing hypertension complicating pregnancy, unspecified trimester: Secondary | ICD-10-CM

## 2018-11-10 DIAGNOSIS — Z3A38 38 weeks gestation of pregnancy: Secondary | ICD-10-CM

## 2018-11-10 DIAGNOSIS — O10913 Unspecified pre-existing hypertension complicating pregnancy, third trimester: Secondary | ICD-10-CM

## 2018-11-10 DIAGNOSIS — O09299 Supervision of pregnancy with other poor reproductive or obstetric history, unspecified trimester: Secondary | ICD-10-CM

## 2018-11-10 LAB — POCT URINALYSIS DIPSTICK OB: Glucose, UA: NEGATIVE

## 2018-11-10 NOTE — Progress Notes (Signed)
Obstetric H&P   Chief Complaint: Scheduled Cesarean Section  Prenatal Care Provider: [facility]  History of Present Illness: 38 y.o. [id number] [redacted]w[redacted]d by 11/24/2018, by Last Menstrual Period presenting for C-section consents today.  Pregnancy notable for [facility] well controlled, hypothyroidism, abnormal 1-hr oral glucose tolerance test with normal 3-hr (1/4 abnormal), and history of prior C-section secondary to history of 3rd degree laceration with G1.  +FM, no LOF, no VB, no ctx.  Most recent growth [redacted]w[redacted]d 2228g or 4lbs 15oz c/w 17.9%ile and AFI 18.7cm  Pregravid weight 193 lb (87.5 kg) Total Weight Gain 1 lb (0.454 kg)  Pregnancy#6 Problems (from 02/17/18 to present)    Problem Noted Resolved   Supervision of high risk pregnancy, antepartum 04/01/2018 by Malachy Mood, MD No   MTHFR gene mutation ([place]) 03/31/2018 by Malachy Mood, MD No   Overview Signed 03/31/2018  9:50 AM by Malachy Mood, MD    [X]  Additional folic acid      History of maternal third degree perineal laceration, currently pregnant 03/31/2018 by Malachy Mood, MD No   Antepartum multigravida of advanced maternal age 40/17/2020 by Malachy Mood, MD No   Overview Addendum 10/13/2018  1:16 PM by Malachy Mood, MD    [facility] Prenatal Labs  Dating LMP = 11 week [place] Blood type: A positive  Genetic Screen NIPS: Normal XY Antibody: Negative  Anatomic [place] Complete Rubella: Immune Varicella: Immune  GTT Early: 178 3-hr 91, 147, 143, 125  Third trimester: 81, 193, 110, 79 RPR: NR  Rhogam N/A HBsAg: Negative   TDaP vaccine 09/18/2018  Flu Shot: HIV: Non Reactive (12/05 2216)   Baby Food                                GBS: negative  Contraception Undecided, potential interval BTL signed paper 10/27 Pap: 01/21/2018 NIL HPV negative  CBB     CS/VBAC Repeat C-section 11/17/2018   Support Person Husband [name]              Review of Systems: 10 point review of systems negative unless otherwise noted in HPI  Past  Medical History: Past Medical History:  Diagnosis Date  . Anemia   . Anxiety state, unspecified   . 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 Clotting disorder ([place])   . GERD (gastroesophageal reflux disease)    OCC  . 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 Hypertension   . Hypothyroidism   . Multiple joint pain 09/04/2015  . Obesity   . Rosacea   . Undiagnosed cardiac murmurs   . Unspecified essential hypertension     Past Surgical History: Past Surgical History:  Procedure Laterality Date  . APPENDECTOMY  2009   [facility]  . CESAREAN SECTION  2016  . CHOLECYSTECTOMY N/A 03/20/2016   Procedure: LAPAROSCOPIC CHOLECYSTECTOMY;  Surgeon: Jules Husbands, MD;  Location: [facility] ORS;  Service: General;  Laterality: N/A;  . DILATION AND CURETTAGE OF UTERUS      Past Obstetric History: # 1 - Date: 07/20/10, Sex: Female, Weight: None, GA: None, Delivery: Vaginal, Spontaneous, Apgar1: None, Apgar5: None, Living: Living, Birth Comments: None  # 2 - Date: 2013, Sex: None, Weight: None, GA: None, Delivery: None, Apgar1: None, Apgar5: None, Living: None, Birth Comments: None  # 3 - Date: 2014, Sex: None, Weight: None, GA: None, Delivery: None, Apgar1: None, Apgar5: None, Living: None, Birth  Comments: None  # 4 - Date: 2015, Sex: None, Weight: None, GA: None, Delivery: None, Apgar1: None, Apgar5: None, Living: None, Birth Comments: None  # 5 - Date: 03/13/14, Sex: Female, Weight: None, GA: None, Delivery: C-Section, Low Transverse, Apgar1: None, Apgar5: None, Living: Living, Birth Comments: None  # 6 - Date: None, Sex: None, Weight: None, GA: None, Delivery: None, Apgar1: None, Apgar5: None, Living: None, Birth Comments: None   Past Gynecologic History:  Family History: Family History  Problem Relation Age of Onset  . Mitral valve prolapse Mother   . Arrhythmia Mother   . Hypertension Mother   . Diabetes Mother   . Hypertension Father   . Hyperlipidemia  Father   . Heart disease Maternal Grandfather   . Lung cancer Maternal Grandfather   . Cancer Maternal Grandfather   . Diabetes Brother   . Heart disease Maternal Aunt   . Diabetes Maternal Aunt   . Depression Maternal Aunt   . Cancer Maternal Grandmother        ovarian melanoma    Social History: Social History   Socioeconomic History  . Marital status: Married    Spouse name: Not on file  . Number of children: 2  . Years of education: Not on file  . Highest education level: Not on file  Occupational History  . Occupation: Agricultural engineer  Social Needs  . Financial resource strain: Not on file  . Food insecurity    Worry: Not on file    Inability: Not on file  . Transportation needs    Medical: Not on file    Non-medical: Not on file  Tobacco Use  . Smoking status: Former Smoker    Packs/day: 0.25    Years: 1.00    Pack years: 0.25    Types: Cigarettes    Quit date: 03/13/1998    Years since quitting: 20.6  . Smokeless tobacco: Never Used  Substance and Sexual Activity  . Alcohol use: No  . Drug use: No  . Sexual activity: Not Currently    Birth control/protection: None    Comment: undecided  Lifestyle  . Physical activity    Days per week: Not on file    Minutes per session: Not on file  . Stress: Not on file  Relationships  . Social Herbalist on phone: Not on file    Gets together: Not on file    Attends religious service: Not on file    Active member of club or organization: Not on file    Attends meetings of clubs or organizations: Not on file    Relationship status: Not on file  . Intimate partner violence    Fear of current or ex partner: Not on file    Emotionally abused: Not on file    Physically abused: Not on file    Forced sexual activity: Not on file  Other Topics Concern  . Not on file  Social History Narrative           Medications: Prior to Admission medications   Medication Sig Start Date End Date Taking? Authorizing  Provider  alum & mag hydroxide-simeth (MAALOX/MYLANTA) 200-200-20 MG/5ML suspension Take 20 mLs by mouth daily as needed for indigestion or heartburn.    Yes [provider]  aspirin EC 81 MG tablet Take 81 mg by mouth daily.   Yes [provider]  calcium carbonate (TUMS - DOSED IN MG ELEMENTAL CALCIUM) 500 MG chewable tablet Chew 2 tablets  by mouth daily as needed for indigestion or heartburn.   Yes [provider]  cephALEXin (KEFLEX) 500 MG capsule Take 1 capsule (500 mg total) by mouth 3 (three) times daily. 11/09/18  Yes Dalia Heading, CNM  folic acid (FOLVITE) 1 MG tablet Take 1 tablet (1 mg total) by mouth daily. 03/31/18  Yes Malachy Mood, MD  labetalol (NORMODYNE) 100 MG tablet Take 1 tablet (100 mg total) by mouth 2 (two) times daily. 09/03/18  Yes Malachy Mood, MD  levothyroxine (SYNTHROID) 75 MCG tablet Take 1 tablet (75 mcg total) by mouth daily before breakfast. 09/03/18  Yes Malachy Mood, MD    Allergies: Allergies  Allergen Reactions  . Biaxin [Clarithromycin] Hives  . Toradol [Ketorolac Tromethamine]     Chest pain   . Prednisone Anxiety and Palpitations    "Makes me feel crazy"  . Morphine And Related     Chest Pain   . Tape Rash    Paper tape is fine    Physical Exam: Vitals: Blood pressure 126/82, pulse 88, height 5\' 2"  (1.575 m), weight 192 lb (87.1 kg), last menstrual period 02/17/2018.  Urine Dip Protein: negative  FHT: 135  General: NAD, well nourished, appears stated age 70: normocephalic, anicteric Pulmonary: CTAB No increased work of breathing Cardiovascular: RRR, distal pulses 2+ Abdomen: Gravid, non-tender, fundal height 37cm Leopolds: vtx Extremities: no edema, erythema, or tenderness Neurologic: Grossly intact Psychiatric: mood appropriate, affect full  Labs: Results for orders placed or performed in visit on 11/10/18 (from the past 24 hour(s))  POC Urinalysis Dipstick OB     Status: Abnormal    Collection Time: 11/10/18 10:33 AM  Result Value Ref Range   Color, UA     Clarity, UA     Glucose, UA Negative Negative   Bilirubin, UA     Ketones, UA     Spec Grav, UA     Blood, UA     pH, UA     POC,PROTEIN,UA Small (1+) Negative, Trace, Small (1+), Moderate (2+), Large (3+), 4+   Urobilinogen, UA     Nitrite, UA     Leukocytes, UA     Appearance     Odor      Assessment: 38 y.o. [id number] [redacted]w[redacted]d by 11/24/2018, by Last Menstrual Period with history of prior Cesarean section  Plan: 1) The patient was counseled regarding risk and benefits to proceeding with Cesarean section to expedite delivery.  Risk of cesarean section were discussed including risk of bleeding and need for potential intraoperative or postoperative blood transfusion with a rate of approximately 5% quoted for all Cesarean sections, risk of injury to adjacent organs including but not limited to bowl and bladder, the need for additional surgical procedures to address such injuries, and the risk of infection.   38 y.o. [id number]  with undesired fertility, desires permanent sterilization.  Other reversible forms of contraception were discussed with patient; she declines all other modalities. Permanent nature of as well as associated risks of the procedure discussed with patient including but not limited to: risk of regret, permanence of method, bleeding, infection, injury to surrounding organs and need for additional procedures.  Failure risk of 0.5-1% with increased risk of ectopic gestation if pregnancy occurs was also discussed with patient.  Papers signed, will need interval if she decided to proceed (still unsure)  2) Fetus - +FHT  3) PNL - Blood type --/--/A POS (09/30 0010) / Anti-bodyscreen NEG (09/30 0010) / Rubella 5.31 (03/17 1020) / Varicella  Immune / RPR Non Reactive (08/20 0911) / HBsAg Negative (03/17 1020) / HIV Non Reactive (08/20 0911) / GBS Negative/-- (10/16 1625)  4) Immunization History -  Immunization  History  Administered Date(s) Administered  . Tdap 07/14/2013, 09/18/2018    5) Disposition - pending delivery  Malachy Mood, MD, [place], [facility] 11/10/2018, 10:40 AM

## 2018-11-11 ENCOUNTER — Other Ambulatory Visit: Admission: RE | Admit: 2018-11-11 | Payer: BLUE CROSS/BLUE SHIELD | Source: Ambulatory Visit

## 2018-11-11 ENCOUNTER — Other Ambulatory Visit: Payer: Self-pay

## 2018-11-11 ENCOUNTER — Telehealth: Payer: Self-pay

## 2018-11-11 ENCOUNTER — Encounter
Admission: RE | Admit: 2018-11-11 | Discharge: 2018-11-11 | Disposition: A | Discharge status: Home / Self Care | Payer: Medicaid Other | Source: Ambulatory Visit | Attending: Obstetrics and Gynecology | Admitting: Obstetrics and Gynecology

## 2018-11-11 DIAGNOSIS — Z20828 Contact with and (suspected) exposure to other viral communicable diseases: Secondary | ICD-10-CM | POA: Insufficient documentation

## 2018-11-11 DIAGNOSIS — Z01812 Encounter for preprocedural laboratory examination: Secondary | ICD-10-CM | POA: Insufficient documentation

## 2018-11-11 NOTE — Telephone Encounter (Signed)
Yes discontinue

## 2018-11-11 NOTE — Telephone Encounter (Signed)
Patient scheduled for c-section Tuesday 11/17/2018. Inquiring if/when she needs to stop taking her baby aspirin. [id number]

## 2018-11-11 NOTE — Patient Instructions (Signed)
Your COVID swab is scheduled on Friday November 13, 2018.  Drive up in front of the [facility] any time 8-10:30am and remain in your vehicle.  Your procedure is scheduled on: Tuesday November 17, 2018 @ 5:15 am Report to the [facility] and call [phone] when you arrive and you will be escorted to the Labor and Delivery Suite.  Remember: Instructions that are not followed completely may result in serious medical risk, up to and including death, or upon the discretion of your surgeon and anesthesiologist your surgery may need to be rescheduled.    __x__ 1. Do not eat food (including mints, candies, chewing gum) after midnight the night before your procedure. You may drink clear liquids up to 2 hours before you are scheduled to arrive at the hospital for your procedure.  Do not drink anything within 2 hours of your scheduled arrival to the hospital.  Approved clear liquids:  --Water or Apple juice without pulp  --Clear carbohydrate beverage such as Gatorade or Powerade  --Black Coffee or Clear Tea (No milk, no creamers, do not add anything to the coffee or tea)    __x__ 2. No Alcohol for 24 hours before or after surgery.   __x__ 3. No Smoking or e-cigarettes for 24 hours before surgery.  Do not use any chewable tobacco products for at least 6 hours before surgery.   __x__ 4. Notify your doctor if there is any change in your medical condition (cold, fever, infections).   __x__ 5. On the morning of surgery brush your teeth with toothpaste and water.  You may rinse your mouth with mouthwash if you wish.  Do not swallow any toothpaste or mouthwash.  Please read over the following fact sheets that you were given:   [facility] Preparing for Surgery and/or MRSA Information    __x__ Use CHG Soap as directed on instruction sheet.   Do not wear jewelry, make-up, hairpins, clips or nail polish on the day of surgery.  Do not wear lotions, powders, deodorant, or perfumes.   Do not shave  below the face/neck 48 hours prior to surgery.   Do not bring valuables to the hospital.    [facility] is not responsible for any belongings or valuables.               Contacts, dentures or bridgework may not be worn into surgery.  For patients admitted to the hospital, discharge time is determined by your treatment team.  __x__ Take these medicines on the morning of your delivery with a SMALL SIP OF WATER:  1. Labetalol (Normodyne)  2. Levothyroxine (Synthroid)  3. Folic Acid (Folvite)  __x__ Follow recommendations from your doctor regarding stopping Aspirin prior to 11/3, Coumadin, Plavix, Eliquis, Effient, Pradaxa, and Pletal.  __x__ STARTING TODAY: Do not take any Anti-inflammatories such as Advil, Ibuprofen, Motrin, Aleve, Naproxen, Naprosyn, BC/Goodies powders or aspirin products. You may take Tylenol if needed.   __x__ STARTING TODAY: Do not take any over the counter herbal or nutritional supplements until after surgery. You may continue to take your folic acid.  Reviewed instructions with patient and answered questions via telephone interview 11/11/2018 @ 9:00 am.

## 2018-11-12 NOTE — Telephone Encounter (Signed)
Pt aware.

## 2018-11-13 ENCOUNTER — Other Ambulatory Visit: Payer: Self-pay

## 2018-11-13 ENCOUNTER — Observation Stay
Admission: EM | Admit: 2018-11-13 | Discharge: 2018-11-14 | Disposition: A | Discharge status: Home / Self Care | Payer: Medicaid Other | Source: Home / Self Care | Admitting: Obstetrics and Gynecology

## 2018-11-13 ENCOUNTER — Other Ambulatory Visit
Admission: RE | Admit: 2018-11-13 | Discharge: 2018-11-13 | Disposition: A | Discharge status: Home / Self Care | Payer: Medicaid Other | Source: Ambulatory Visit | Attending: Obstetrics and Gynecology | Admitting: Obstetrics and Gynecology

## 2018-11-13 DIAGNOSIS — Z888 Allergy status to other drugs, medicaments and biological substances status: Secondary | ICD-10-CM | POA: Insufficient documentation

## 2018-11-13 DIAGNOSIS — Z885 Allergy status to narcotic agent status: Secondary | ICD-10-CM | POA: Insufficient documentation

## 2018-11-13 DIAGNOSIS — E7212 Methylenetetrahydrofolate reductase deficiency: Secondary | ICD-10-CM

## 2018-11-13 DIAGNOSIS — O10913 Unspecified pre-existing hypertension complicating pregnancy, third trimester: Secondary | ICD-10-CM

## 2018-11-13 DIAGNOSIS — O09299 Supervision of pregnancy with other poor reproductive or obstetric history, unspecified trimester: Secondary | ICD-10-CM

## 2018-11-13 DIAGNOSIS — Z01812 Encounter for preprocedural laboratory examination: Secondary | ICD-10-CM | POA: Diagnosis not present

## 2018-11-13 DIAGNOSIS — O0990 Supervision of high risk pregnancy, unspecified, unspecified trimester: Secondary | ICD-10-CM

## 2018-11-13 DIAGNOSIS — O09529 Supervision of elderly multigravida, unspecified trimester: Secondary | ICD-10-CM

## 2018-11-13 DIAGNOSIS — O10013 Pre-existing essential hypertension complicating pregnancy, third trimester: Secondary | ICD-10-CM | POA: Diagnosis present

## 2018-11-13 DIAGNOSIS — O163 Unspecified maternal hypertension, third trimester: Secondary | ICD-10-CM | POA: Insufficient documentation

## 2018-11-13 DIAGNOSIS — Z1589 Genetic susceptibility to other disease: Secondary | ICD-10-CM

## 2018-11-13 DIAGNOSIS — Z3A38 38 weeks gestation of pregnancy: Secondary | ICD-10-CM | POA: Insufficient documentation

## 2018-11-13 DIAGNOSIS — Z20828 Contact with and (suspected) exposure to other viral communicable diseases: Secondary | ICD-10-CM | POA: Diagnosis not present

## 2018-11-13 LAB — COMPREHENSIVE METABOLIC PANEL
ALT: 13 U/L (ref 0–44)
AST: 15 U/L (ref 15–41)
Albumin: 2.8 g/dL — ABNORMAL LOW (ref 3.5–5.0)
Alkaline Phosphatase: 195 U/L — ABNORMAL HIGH (ref 38–126)
Anion gap: 11 (ref 5–15)
BUN: 10 mg/dL (ref 6–20)
CO2: 18 mmol/L — ABNORMAL LOW (ref 22–32)
Calcium: 9.2 mg/dL (ref 8.9–10.3)
Chloride: 109 mmol/L (ref 98–111)
Creatinine, Ser: 0.6 mg/dL (ref 0.44–1.00)
GFR calc Af Amer: >60 mL/min (ref >60)
GFR calc non Af Amer: >60 mL/min (ref >60)
Glucose, Bld: 93 mg/dL (ref 70–99)
Potassium: 3.7 mmol/L (ref 3.5–5.1)
Sodium: 138 mmol/L (ref 135–145)
Total Bilirubin: 0.4 mg/dL (ref 0.3–1.2)
Total Protein: 6.8 g/dL (ref 6.5–8.1)

## 2018-11-13 LAB — CBC
HCT: 34.9 % — ABNORMAL LOW (ref 36.0–46.0)
Hemoglobin: 11.6 g/dL — ABNORMAL LOW (ref 12.0–15.0)
MCH: 28.6 pg (ref 26.0–34.0)
MCHC: 33.2 g/dL (ref 30.0–36.0)
MCV: 86 fL (ref 80.0–100.0)
Platelets: 326 10*3/uL (ref 150–400)
RBC: 4.06 MIL/uL (ref 3.87–5.11)
RDW: 13.4 % (ref 11.5–15.5)
WBC: 13.3 10*3/uL — ABNORMAL HIGH (ref 4.0–10.5)
nRBC: 0 % (ref 0.0–0.2)

## 2018-11-13 LAB — PROTEIN / CREATININE RATIO, URINE
Creatinine, Urine: 173 mg/dL
Protein Creatinine Ratio: 0.25 mg/mg{Cre} — ABNORMAL HIGH (ref 0.00–0.15)
Total Protein, Urine: 43 mg/dL

## 2018-11-13 LAB — SARS CORONAVIRUS 2 (TAT 6-24 HRS): SARS Coronavirus 2: NEGATIVE

## 2018-11-13 MED ORDER — ACETAMINOPHEN 325 MG PO TABS
650.0000 mg | ORAL_TABLET | Freq: Once | ORAL | Status: AC
  Administered 2018-11-13: 650 mg via ORAL
  Filled 2018-11-13: qty 2

## 2018-11-13 MED ORDER — LABETALOL HCL 100 MG PO TABS
100.0000 mg | ORAL_TABLET | Freq: Two times a day (BID) | ORAL | Status: DC
  Administered 2018-11-13: 100 mg via ORAL
  Filled 2018-11-13: qty 1

## 2018-11-13 NOTE — OB Triage Note (Signed)
Discharge instructions provided and reviewed.  Follow up care discussed.  Pt verbalizes understanding. 

## 2018-11-13 NOTE — Discharge Summary (Signed)
Physician Final Progress Note  Patient ID: Anita Massey MRN: [id number] DOB/AGE: Sep 04, 1980 38 y.o.  Admit date: 11/13/2018 Admitting provider: Malachy Mood, MD Discharge date: 11/13/2018   Admission Diagnoses: Hypertension in pregnancy  Discharge Diagnoses:  Active Problems:   Hypertension in pregnancy, essential, antepartum, third trimester  38 y.o. [id number] at [redacted]w[redacted]d presenting after elevated home BP reading peaking in the 190/110's.  She became of her heartbeat throbbing in her head.  On presentation the patient was noted to be normotensive.  Given the headache type symptoms labs were rechecked and noted to be normal, P/C ratio stable as well.  The patient has a reactive NST.  +FM, no LOF, no VB.  Delivery is scheduled for 11/17/2018.    Pulse Rate:  [84-99] 87 (10/30 2302) Resp:  [20] 20 (10/30 2031) BP: (110-150)/(70-85) 132/75 (10/30 2302) Weight:  [86.2 kg] 86.2 kg (10/30 2031)   Consults: None  Significant Findings/ Diagnostic Studies:  Results for orders placed or performed during the hospital encounter of 11/13/18 (from the past 24 hour(s))  Protein / creatinine ratio, urine     Status: Abnormal   Collection Time: 11/13/18  9:02 PM  Result Value Ref Range   Creatinine, Urine 173 mg/dL   Total Protein, Urine 43 mg/dL   Protein Creatinine Ratio 0.25 (H) 0.00 - 0.15 mg/mg[Cre]  CBC     Status: Abnormal   Collection Time: 11/13/18  9:30 PM  Result Value Ref Range   WBC 13.3 (H) 4.0 - 10.5 K/uL   RBC 4.06 3.87 - 5.11 MIL/uL   Hemoglobin 11.6 (L) 12.0 - 15.0 g/dL   HCT 34.9 (L) 36.0 - 46.0 %   MCV 86.0 80.0 - 100.0 fL   MCH 28.6 26.0 - 34.0 pg   MCHC 33.2 30.0 - 36.0 g/dL   RDW 13.4 11.5 - 15.5 %   Platelets 326 150 - 400 K/uL   nRBC 0.0 0.0 - 0.2 %  Comprehensive metabolic panel     Status: Abnormal   Collection Time: 11/13/18  9:30 PM  Result Value Ref Range   Sodium 138 135 - 145 mmol/L   Potassium 3.7 3.5 - 5.1 mmol/L   Chloride 109 98 - 111 mmol/L   CO2 18 (L) 22 - 32 mmol/L   Glucose, Bld 93 70 - 99 mg/dL   BUN 10 6 - 20 mg/dL   Creatinine, Ser 0.60 0.44 - 1.00 mg/dL   Calcium 9.2 8.9 - 10.3 mg/dL   Total Protein 6.8 6.5 - 8.1 g/dL   Albumin 2.8 (L) 3.5 - 5.0 g/dL   AST 15 15 - 41 U/L   ALT 13 0 - 44 U/L   Alkaline Phosphatase 195 (H) 38 - 126 U/L   Total Bilirubin 0.4 0.3 - 1.2 mg/dL   GFR calc non Af Amer >60 >60 mL/min   GFR calc Af Amer >60 >60 mL/min   Anion gap 11 5 - 15    Procedures:  Baseline: 150 initially with change of baseline to 125 Variability: moderate Accelerations: present Decelerations: absent Tocometry: none The patient was monitored for 90 minutes, fetal heart rate tracing was deemed reactive, category I tracing,  CPT [id number]   Discharge Condition: good  Disposition: Discharge disposition: 01-Home or Self Care       Diet: Regular diet  Discharge Activity: Activity as tolerated  Discharge Instructions    Discharge activity:  No Restrictions   Complete by: As directed    Discharge activity:  No Restrictions  Complete by: As directed    Discharge diet:  No restrictions   Complete by: As directed    Discharge diet:  No restrictions   Complete by: As directed    Fetal Kick Count:  Lie on our left side for one hour after a meal, and count the number of times your baby kicks.  If it is less than 5 times, get up, move around and drink some juice.  Repeat the test 30 minutes later.  If it is still less than 5 kicks in an hour, notify your doctor.   Complete by: As directed    Fetal Kick Count:  Lie on our left side for one hour after a meal, and count the number of times your baby kicks.  If it is less than 5 times, get up, move around and drink some juice.  Repeat the test 30 minutes later.  If it is still less than 5 kicks in an hour, notify your doctor.   Complete by: As directed    LABOR:  When conractions begin, you should start to time them from the beginning of one contraction to the  beginning  of the next.  When contractions are 5 - 10 minutes apart or less and have been regular for at least an hour, you should call your health care provider.   Complete by: As directed    LABOR:  When conractions begin, you should start to time them from the beginning of one contraction to the beginning  of the next.  When contractions are 5 - 10 minutes apart or less and have been regular for at least an hour, you should call your health care provider.   Complete by: As directed    No sexual activity restrictions   Complete by: As directed    No sexual activity restrictions   Complete by: As directed    Notify physician for bleeding from the vagina   Complete by: As directed    Notify physician for bleeding from the vagina   Complete by: As directed    Notify physician for blurring of vision or spots before the eyes   Complete by: As directed    Notify physician for blurring of vision or spots before the eyes   Complete by: As directed    Notify physician for chills or fever   Complete by: As directed    Notify physician for chills or fever   Complete by: As directed    Notify physician for fainting spells, "black outs" or loss of consciousness   Complete by: As directed    Notify physician for fainting spells, "black outs" or loss of consciousness   Complete by: As directed    Notify physician for increase in vaginal discharge   Complete by: As directed    Notify physician for increase in vaginal discharge   Complete by: As directed    Notify physician for leaking of fluid   Complete by: As directed    Notify physician for leaking of fluid   Complete by: As directed    Notify physician for pain or burning when urinating   Complete by: As directed    Notify physician for pain or burning when urinating   Complete by: As directed    Notify physician for pelvic pressure (sudden increase)   Complete by: As directed    Notify physician for pelvic pressure (sudden increase)    Complete by: As directed    Notify physician for severe or  continued nausea or vomiting   Complete by: As directed    Notify physician for severe or continued nausea or vomiting   Complete by: As directed    Notify physician for sudden gushing of fluid from the vagina (with or without continued leaking)   Complete by: As directed    Notify physician for sudden gushing of fluid from the vagina (with or without continued leaking)   Complete by: As directed    Notify physician for sudden, constant, or occasional abdominal pain   Complete by: As directed    Notify physician for sudden, constant, or occasional abdominal pain   Complete by: As directed    Notify physician if baby moving less than usual   Complete by: As directed    Notify physician if baby moving less than usual   Complete by: As directed      Allergies as of 11/13/2018      Reactions   Biaxin [clarithromycin] Hives   Toradol [ketorolac Tromethamine]    Chest pain    Prednisone Anxiety, Palpitations   "Makes me feel crazy"   Morphine And Related    Chest Pain    Tape Rash   Paper tape is fine      Medication List    STOP taking these medications   aspirin EC 81 MG tablet     TAKE these medications   alum & mag hydroxide-simeth 200-200-20 MG/5ML suspension Commonly known as: MAALOX/MYLANTA Take 20 mLs by mouth daily as needed for indigestion or heartburn.   calcium carbonate 500 MG chewable tablet Commonly known as: TUMS - dosed in mg elemental calcium Chew 2 tablets by mouth daily as needed for indigestion or heartburn.   cephALEXin 500 MG capsule Commonly known as: KEFLEX Take 1 capsule (500 mg total) by mouth 3 (three) times daily.   folic acid 1 MG tablet Commonly known as: FOLVITE Take 1 tablet (1 mg total) by mouth daily.   labetalol 100 MG tablet Commonly known as: NORMODYNE Take 1 tablet (100 mg total) by mouth 2 (two) times daily.   levothyroxine 75 MCG tablet Commonly known as:  SYNTHROID Take 1 tablet (75 mcg total) by mouth daily before breakfast.        Total time spent taking care of this patient: 45 minutes  Signed: Malachy Mood 11/13/2018, 11:55 PM

## 2018-11-13 NOTE — OB Triage Note (Signed)
Pt. presented to L/D with reported high blood pressures at home. Last B/P139/85. Pt reports frontal headache rated 7/10 that began at 1900. No current blurry vision, but she did note slight blurry vision this afternoon. No bleeding LOF. She reports positive fetal movement. The pt. Did note a sharp, right sided pain that went away after a few moments.Facial flushing noted.  No other PIH symptoms. VSS. Will continue to monitor.

## 2018-11-14 ENCOUNTER — Other Ambulatory Visit: Payer: Self-pay | Admitting: Obstetrics and Gynecology

## 2018-11-14 ENCOUNTER — Other Ambulatory Visit: Payer: Self-pay

## 2018-11-14 ENCOUNTER — Inpatient Hospital Stay
Admission: RE | Admit: 2018-11-14 | Discharge: 2018-11-17 | DRG: 788 | Disposition: A | Discharge status: Home / Self Care | Payer: Medicaid Other | Attending: Obstetrics and Gynecology | Admitting: Obstetrics and Gynecology

## 2018-11-14 ENCOUNTER — Telehealth: Payer: Self-pay

## 2018-11-14 DIAGNOSIS — Z87891 Personal history of nicotine dependence: Secondary | ICD-10-CM | POA: Diagnosis not present

## 2018-11-14 DIAGNOSIS — D649 Anemia, unspecified: Secondary | ICD-10-CM | POA: Diagnosis present

## 2018-11-14 DIAGNOSIS — O1414 Severe pre-eclampsia complicating childbirth: Secondary | ICD-10-CM

## 2018-11-14 DIAGNOSIS — O34211 Maternal care for low transverse scar from previous cesarean delivery: Secondary | ICD-10-CM | POA: Diagnosis not present

## 2018-11-14 DIAGNOSIS — O9902 Anemia complicating childbirth: Secondary | ICD-10-CM | POA: Diagnosis present

## 2018-11-14 DIAGNOSIS — R030 Elevated blood-pressure reading, without diagnosis of hypertension: Secondary | ICD-10-CM | POA: Diagnosis present

## 2018-11-14 DIAGNOSIS — Z1589 Genetic susceptibility to other disease: Secondary | ICD-10-CM

## 2018-11-14 DIAGNOSIS — O1002 Pre-existing essential hypertension complicating childbirth: Secondary | ICD-10-CM | POA: Diagnosis present

## 2018-11-14 DIAGNOSIS — O114 Pre-existing hypertension with pre-eclampsia, complicating childbirth: Secondary | ICD-10-CM | POA: Diagnosis present

## 2018-11-14 DIAGNOSIS — O0990 Supervision of high risk pregnancy, unspecified, unspecified trimester: Secondary | ICD-10-CM

## 2018-11-14 DIAGNOSIS — O34219 Maternal care for unspecified type scar from previous cesarean delivery: Secondary | ICD-10-CM

## 2018-11-14 DIAGNOSIS — O09529 Supervision of elderly multigravida, unspecified trimester: Secondary | ICD-10-CM

## 2018-11-14 DIAGNOSIS — Z3A38 38 weeks gestation of pregnancy: Secondary | ICD-10-CM

## 2018-11-14 DIAGNOSIS — O09299 Supervision of pregnancy with other poor reproductive or obstetric history, unspecified trimester: Secondary | ICD-10-CM

## 2018-11-14 DIAGNOSIS — E7212 Methylenetetrahydrofolate reductase deficiency: Secondary | ICD-10-CM

## 2018-11-14 LAB — COMPREHENSIVE METABOLIC PANEL
ALT: 12 U/L (ref 0–44)
AST: 14 U/L — ABNORMAL LOW (ref 15–41)
Albumin: 2.7 g/dL — ABNORMAL LOW (ref 3.5–5.0)
Alkaline Phosphatase: 181 U/L — ABNORMAL HIGH (ref 38–126)
Anion gap: 9 (ref 5–15)
BUN: 9 mg/dL (ref 6–20)
CO2: 18 mmol/L — ABNORMAL LOW (ref 22–32)
Calcium: 9.3 mg/dL (ref 8.9–10.3)
Chloride: 110 mmol/L (ref 98–111)
Creatinine, Ser: 0.52 mg/dL (ref 0.44–1.00)
GFR calc Af Amer: >60 mL/min (ref >60)
GFR calc non Af Amer: >60 mL/min (ref >60)
Glucose, Bld: 103 mg/dL — ABNORMAL HIGH (ref 70–99)
Potassium: 3.8 mmol/L (ref 3.5–5.1)
Sodium: 137 mmol/L (ref 135–145)
Total Bilirubin: 0.4 mg/dL (ref 0.3–1.2)
Total Protein: 6.5 g/dL (ref 6.5–8.1)

## 2018-11-14 LAB — CBC
HCT: 35.1 % — ABNORMAL LOW (ref 36.0–46.0)
Hemoglobin: 11.6 g/dL — ABNORMAL LOW (ref 12.0–15.0)
MCH: 28.4 pg (ref 26.0–34.0)
MCHC: 33 g/dL (ref 30.0–36.0)
MCV: 85.8 fL (ref 80.0–100.0)
Platelets: 310 10*3/uL (ref 150–400)
RBC: 4.09 MIL/uL (ref 3.87–5.11)
RDW: 13.6 % (ref 11.5–15.5)
WBC: 11.2 10*3/uL — ABNORMAL HIGH (ref 4.0–10.5)
nRBC: 0 % (ref 0.0–0.2)

## 2018-11-14 LAB — TYPE AND SCREEN
ABO/RH(D): A POS
Antibody Screen: NEGATIVE

## 2018-11-14 LAB — RAPID HIV SCREEN (HIV 1/2 AB+AG)
HIV 1/2 Antibodies: NONREACTIVE
HIV-1 P24 Antigen - HIV24: NONREACTIVE

## 2018-11-14 LAB — PROTEIN / CREATININE RATIO, URINE
Creatinine, Urine: 85 mg/dL
Protein Creatinine Ratio: 0.21 mg/mg{Cre} — ABNORMAL HIGH (ref 0.00–0.15)
Total Protein, Urine: 18 mg/dL

## 2018-11-14 MED ORDER — MAGNESIUM SULFATE 40 GM/1000ML IV SOLN
INTRAVENOUS | Status: AC
  Filled 2018-11-14: qty 1000

## 2018-11-14 MED ORDER — SOD CITRATE-CITRIC ACID 500-334 MG/5ML PO SOLN
30.0000 mL | ORAL | Status: AC
  Administered 2018-11-14: 30 mL via ORAL
  Filled 2018-11-14: qty 30

## 2018-11-14 MED ORDER — LABETALOL HCL 5 MG/ML IV SOLN
40.0000 mg | INTRAVENOUS | Status: DC | PRN
  Filled 2018-11-14: qty 8

## 2018-11-14 MED ORDER — LACTATED RINGERS IV SOLN
INTRAVENOUS | Status: DC
  Administered 2018-11-14: 20:00:00 via INTRAVENOUS

## 2018-11-14 MED ORDER — LACTATED RINGERS IV SOLN
Freq: Once | INTRAVENOUS | Status: AC
  Administered 2018-11-14: via INTRAVENOUS

## 2018-11-14 MED ORDER — FENTANYL CITRATE (PF) 100 MCG/2ML IJ SOLN
INTRAMUSCULAR | Status: AC
  Filled 2018-11-14: qty 2

## 2018-11-14 MED ORDER — BUPIVACAINE HCL (PF) 0.5 % IJ SOLN: 20.0000 mL | INTRAMUSCULAR | Status: DC

## 2018-11-14 MED ORDER — LABETALOL HCL 5 MG/ML IV SOLN
20.0000 mg | INTRAVENOUS | Status: DC | PRN
  Administered 2018-11-14: 20 mg via INTRAVENOUS
  Filled 2018-11-14 (×2): qty 4

## 2018-11-14 MED ORDER — SODIUM CHLORIDE (PF) 0.9 % IJ SOLN
INTRAMUSCULAR | Status: AC
  Filled 2018-11-14: qty 50

## 2018-11-14 MED ORDER — CEFAZOLIN SODIUM-DEXTROSE 2-4 GM/100ML-% IV SOLN
2.0000 g | INTRAVENOUS | Status: AC
  Administered 2018-11-15: 2 g via INTRAVENOUS
  Filled 2018-11-14: qty 100

## 2018-11-14 MED ORDER — OXYTOCIN 40 UNITS IN NORMAL SALINE INFUSION - SIMPLE MED
INTRAVENOUS | Status: AC
  Filled 2018-11-14: qty 1000

## 2018-11-14 MED ORDER — MAGNESIUM SULFATE 40 GM/1000ML IV SOLN: 1.0000 g/h | INTRAVENOUS | Status: DC

## 2018-11-14 MED ORDER — BUPIVACAINE HCL (PF) 0.5 % IJ SOLN
INTRAMUSCULAR | Status: AC
  Filled 2018-11-14: qty 30

## 2018-11-14 MED ORDER — HYDRALAZINE HCL 20 MG/ML IJ SOLN: 10.0000 mg | INTRAMUSCULAR | Status: DC | PRN

## 2018-11-14 MED ORDER — BUPIVACAINE 0.25 % ON-Q PUMP DUAL CATH 400 ML
400.0000 mL | INJECTION | Status: DC
  Filled 2018-11-14: qty 400

## 2018-11-14 MED ORDER — LABETALOL HCL 5 MG/ML IV SOLN
80.0000 mg | INTRAVENOUS | Status: DC | PRN
  Filled 2018-11-14: qty 16

## 2018-11-14 MED ORDER — ACETAMINOPHEN 500 MG PO TABS
1000.0000 mg | ORAL_TABLET | Freq: Four times a day (QID) | ORAL | Status: DC | PRN
  Administered 2018-11-14: 1000 mg via ORAL
  Filled 2018-11-14: qty 2

## 2018-11-14 MED ORDER — MAGNESIUM SULFATE BOLUS VIA INFUSION
4.0000 g | Freq: Once | INTRAVENOUS | Status: DC
  Filled 2018-11-14: qty 1000

## 2018-11-14 NOTE — Anesthesia Preprocedure Evaluation (Signed)
Anesthesia Evaluation  Patient identified by MRN, date of birth, ID band Patient awake    Reviewed: Allergy & Precautions, NPO status , Patient's Chart, lab work & pertinent test results  History of Anesthesia Complications Negative for: history of anesthetic complications  Airway Mallampati: III       Dental   Pulmonary neg sleep apnea, neg COPD, Not current smoker, former smoker,           Cardiovascular hypertension, Pt. 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), anemia ,   Anesthesia Other Findings   Reproductive/Obstetrics                             Anesthesia Physical Anesthesia Plan  ASA: II and emergent  Anesthesia Plan: Spinal   Post-op Pain Management:    Induction:   PONV Risk Score and Plan:   Airway Management Planned:   Additional Equipment:   Intra-op Plan:   Post-operative Plan:   Informed Consent: I have reviewed the patients History and Physical, chart, labs and discussed the procedure including the risks, benefits and alternatives for the proposed anesthesia with the patient or authorized representative who has indicated his/her understanding and acceptance.       Plan Discussed with:   Anesthesia Plan Comments:         Anesthesia Quick Evaluation

## 2018-11-14 NOTE — Progress Notes (Signed)
  Patient having severe range BP and a headache which has been treated with IV labetalol and tylenol. IV Magnesium was ordered, but patient is hesitant to accept this therapy. Discussed with patient over the phone the reason that we give IV magnesium to prevent seizures from preeclampsia. Discussed that the medication is normally continued for only 24 hours after birth. She said that her sister-in-law had the medication and that is made her feel like her insides were on fire so Anita Massey is hesitant to accept this medication despite understanding the risks of seizure or neurological injury. She will consider and notify nursing if she desires to start magnesium therapy for preeclampsia.

## 2018-11-14 NOTE — H&P (Signed)
H&P  Anita Massey is an 38 y.o. female.  HPI: She presents to hospital with complaints of continued elevated BPs at home and new onset headaches. She has been followed for gestational hypertension this pregnancy but her BP have recently been higher at home. She reports BP of 150-190 / 100s. She denies vision changes. Earlier this week she reports RUQ pain.   Past Medical History:  Diagnosis Date  . Anemia   . Anxiety state, unspecified   . 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 Clotting disorder ([place])   . GERD (gastroesophageal reflux disease)    OCC  . 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 Hypertension   . Hypothyroidism   . Multiple joint pain 09/04/2015  . Obesity   . Rosacea   . Undiagnosed cardiac murmurs   . Unspecified essential hypertension     Past Surgical History:  Procedure Laterality Date  . APPENDECTOMY  2009   [facility]  . CESAREAN SECTION  2016  . CHOLECYSTECTOMY N/A 03/20/2016   Procedure: LAPAROSCOPIC CHOLECYSTECTOMY;  Surgeon: Jules Husbands, MD;  Location: [facility] ORS;  Service: General;  Laterality: N/A;  . DILATION AND CURETTAGE OF UTERUS      Family History  Problem Relation Age of Onset  . Mitral valve prolapse Mother   . Arrhythmia Mother   . Hypertension Mother   . Diabetes Mother   . Hypertension Father   . Hyperlipidemia Father   . Heart disease Maternal Grandfather   . Lung cancer Maternal Grandfather   . Cancer Maternal Grandfather   . Diabetes Brother   . Heart disease Maternal Aunt   . Diabetes Maternal Aunt   . Depression Maternal Aunt   . Cancer Maternal Grandmother        ovarian melanoma    Social History:  reports that she quit smoking about 20 years ago. Her smoking use included cigarettes. She has a 0.25 pack-year smoking history. She has never used smokeless tobacco. She reports that she does not drink alcohol or use drugs.  Allergies:  Allergies  Allergen Reactions  . Biaxin [Clarithromycin]  Hives  . Toradol [Ketorolac Tromethamine]     Chest pain   . Prednisone Anxiety and Palpitations    "Makes me feel crazy"  . Morphine And Related     Chest Pain   . Tape Rash    Paper tape is fine    Medications: I have reviewed the patient's current medications.  Results for orders placed or performed during the hospital encounter of 11/14/18 (from the past 48 hour(s))  Protein / creatinine ratio, urine     Status: Abnormal   Collection Time: 11/14/18  6:30 PM  Result Value Ref Range   Creatinine, Urine 85 mg/dL   Total Protein, Urine 18 mg/dL    Comment: NO NORMAL RANGE ESTABLISHED FOR THIS TEST   Protein Creatinine Ratio 0.21 (H) 0.00 - 0.15 mg/mg[Cre]    Comment: Performed at [facility], [address] [place], [place] [address]  CBC     Status: Abnormal   Collection Time: 11/14/18  7:08 PM  Result Value Ref Range   WBC 11.2 (H) 4.0 - 10.5 K/uL   RBC 4.09 3.87 - 5.11 MIL/uL   Hemoglobin 11.6 (L) 12.0 - 15.0 g/dL   HCT 35.1 (L) 36.0 - 46.0 %   MCV 85.8 80.0 - 100.0 fL   MCH 28.4 26.0 - 34.0 pg   MCHC 33.0  30.0 - 36.0 g/dL   RDW 13.6 11.5 - 15.5 %   Platelets 310 150 - 400 K/uL   nRBC 0.0 0.0 - 0.2 %    Comment: Performed at [facility], [address] [place], [place] [address]  Type and screen [place]     Status: None (Preliminary result)   Collection Time: 11/14/18  7:08 PM  Result Value Ref Range   ABO/RH(D) PENDING    Antibody Screen PENDING    Sample Expiration      11/17/2018,2359 Performed at [facility], [address] [place], [place] [address]     No results found.  Review of Systems  Constitutional: Negative for chills, fever, malaise/fatigue and weight loss.  HENT: Negative for congestion, hearing loss and sinus pain.   Eyes: Negative for blurred vision and double vision.  Respiratory: Negative for cough, sputum production, shortness of breath and wheezing.   Cardiovascular: Negative for  chest pain, palpitations, orthopnea and leg swelling.  Gastrointestinal: Negative for abdominal pain, constipation, diarrhea, nausea and vomiting.  Genitourinary: Negative for dysuria, flank pain, frequency, hematuria and urgency.  Musculoskeletal: Negative for back pain, falls and joint pain.  Skin: Negative for itching and rash.  Neurological: Negative for dizziness and headaches.  Psychiatric/Behavioral: Negative for depression, substance abuse and suicidal ideas. The patient is not nervous/anxious.    Blood pressure (!) 142/77, pulse 83, temperature 98 F (36.7 C), temperature source Oral, resp. rate 16, height 5\' 2"  (1.575 m), weight 86.2 kg, last menstrual period 02/17/2018.   Physical Exam  Nursing note and vitals reviewed. Constitutional: She is oriented to person, place, and time. She appears well-developed and well-nourished.  HENT:  Head: Normocephalic and atraumatic.  Cardiovascular: Normal rate and regular rhythm.  Respiratory: Effort normal and breath sounds normal.  GI: Soft. Bowel sounds are normal.  Musculoskeletal: Normal range of motion.  Neurological: She is alert and oriented to person, place, and time.  Skin: Skin is warm and dry.  Psychiatric: She has a normal mood and affect. Her behavior is normal. Judgment and thought content normal.   NST: 150 bpm baseline, moderate variability, 15x15 accelerations, variable decelerations. Tocometer : quiet   Assessment/Plan: 38 yo [id number] [redacted]w[redacted]d 1. Gestational hypertension and superimposed preeclampsia with severe features (headache). BP required IV labetalol, since have been moderate range. Patient is declining treatment with IV magnesium despite extensive counseling as to the reason for treatment and risk of seizure and neurological injury.   2.  Hx of prior cesarean section and history of third degree laceration- desire repeat cesarean section. Will proceed with delivery today.  Discussed risk of cesarean section in detail  with patient. 3. DVT/PE prophylaxis   Christanna R Schuman 11/14/2018, 7:36 PM

## 2018-11-14 NOTE — Telephone Encounter (Signed)
Pt called Labor and Delivery at 1620 with c/o elevated BP currently 150's/90's at home.  Pt states she had some epigastric pain as well as BP 190/100 last night at home. Pt states she was seen last night for elevated pressures and d/c home. Pt is scheduled for C/S on Tuesday but is concerned about her elevated pressure. Schuman MD notified via phone and informed RN to have patient come to L&D for evaluation and possible delivery. Pt informed not to eat anymore food and come to hospital for evaluation of BP.

## 2018-11-14 NOTE — OB Triage Note (Signed)
Pt is a 38yo G6P3 at [redacted]w[redacted]d that presents from home with elevated pressures. Pt states she has a headache rated 8/10 and her pressure at home was 150/90. Pt denies VB, LOF and states positive FM. Initial BP was 144/89 and cycling q15. Pt is a previous elective c/s due to her having a 4th degree laceration with her first delivery of 5lb infant. Pt denies visual changes, epigastric pain and has mild non pitting edema in bilateral lower extremities. Pt Alert and Oriented x4, heart and lungs WDL and plus 2 reflexes noted on initial assessment. Provider notified of patient arrival to unit.

## 2018-11-15 ENCOUNTER — Inpatient Hospital Stay: Payer: Medicaid Other | Admitting: Anesthesiology

## 2018-11-15 ENCOUNTER — Encounter
Admission: RE | Disposition: A | Discharge status: Home / Self Care | Payer: Self-pay | Source: Home / Self Care | Attending: Obstetrics and Gynecology

## 2018-11-15 LAB — CBC
HCT: 33 % — ABNORMAL LOW (ref 36.0–46.0)
HCT: 31.2 % — ABNORMAL LOW (ref 36.0–46.0)
Hemoglobin: 10.9 g/dL — ABNORMAL LOW (ref 12.0–15.0)
Hemoglobin: 10.6 g/dL — ABNORMAL LOW (ref 12.0–15.0)
MCH: 29 pg (ref 26.0–34.0)
MCH: 29 pg (ref 26.0–34.0)
MCHC: 33 g/dL (ref 30.0–36.0)
MCHC: 34 g/dL (ref 30.0–36.0)
MCV: 87.8 fL (ref 80.0–100.0)
MCV: 85.2 fL (ref 80.0–100.0)
Platelets: 262 10*3/uL (ref 150–400)
Platelets: 255 10*3/uL (ref 150–400)
RBC: 3.76 MIL/uL — ABNORMAL LOW (ref 3.87–5.11)
RBC: 3.66 MIL/uL — ABNORMAL LOW (ref 3.87–5.11)
RDW: 13.6 % (ref 11.5–15.5)
RDW: 13.6 % (ref 11.5–15.5)
WBC: 16.8 10*3/uL — ABNORMAL HIGH (ref 4.0–10.5)
WBC: 14.3 10*3/uL — ABNORMAL HIGH (ref 4.0–10.5)
nRBC: 0 % (ref 0.0–0.2)
nRBC: 0 % (ref 0.0–0.2)

## 2018-11-15 LAB — RPR: RPR Ser Ql: NONREACTIVE

## 2018-11-15 SURGERY — Surgical Case
Anesthesia: Spinal | Site: Abdomen

## 2018-11-15 MED ORDER — ONDANSETRON HCL 4 MG/2ML IJ SOLN
INTRAMUSCULAR | Status: DC | PRN
  Administered 2018-11-15: 4 mg via INTRAVENOUS

## 2018-11-15 MED ORDER — IBUPROFEN 600 MG PO TABS
600.0000 mg | ORAL_TABLET | Freq: Four times a day (QID) | ORAL | Status: DC
  Administered 2018-11-15 – 2018-11-17 (×6): 600 mg via ORAL
  Filled 2018-11-15 (×7): qty 1

## 2018-11-15 MED ORDER — HYDROMORPHONE HCL 1 MG/ML IJ SOLN
0.2000 mg | INTRAMUSCULAR | Status: DC | PRN
  Administered 2018-11-15: 0.5 mg via INTRAVENOUS
  Administered 2018-11-15: 0.6 mg via INTRAVENOUS
  Filled 2018-11-15 (×2): qty 1

## 2018-11-15 MED ORDER — CEPHALEXIN 500 MG PO CAPS
500.0000 mg | ORAL_CAPSULE | Freq: Three times a day (TID) | ORAL | Status: DC
  Administered 2018-11-15 – 2018-11-16 (×2): 500 mg via ORAL
  Filled 2018-11-15 (×4): qty 1

## 2018-11-15 MED ORDER — DIBUCAINE (PERIANAL) 1 % EX OINT: 1.0000 "application " | TOPICAL_OINTMENT | CUTANEOUS | Status: DC | PRN

## 2018-11-15 MED ORDER — OXYTOCIN 40 UNITS IN NORMAL SALINE INFUSION - SIMPLE MED
INTRAVENOUS | Status: DC | PRN
  Administered 2018-11-15: 1000 mL via INTRAVENOUS

## 2018-11-15 MED ORDER — COCONUT OIL OIL: 1.0000 "application " | TOPICAL_OIL | Status: DC | PRN

## 2018-11-15 MED ORDER — CARBOPROST TROMETHAMINE 250 MCG/ML IM SOLN
INTRAMUSCULAR | Status: AC
  Filled 2018-11-15: qty 1

## 2018-11-15 MED ORDER — BISACODYL 10 MG RE SUPP: 10.0000 mg | Freq: Every day | RECTAL | Status: DC | PRN

## 2018-11-15 MED ORDER — ONDANSETRON HCL 4 MG/2ML IJ SOLN: 4.0000 mg | Freq: Once | INTRAMUSCULAR | Status: DC | PRN

## 2018-11-15 MED ORDER — SODIUM CHLORIDE 0.9 % IV SOLN
INTRAVENOUS | Status: DC | PRN
  Administered 2018-11-15: 25 ug/min via INTRAVENOUS

## 2018-11-15 MED ORDER — NALBUPHINE HCL 10 MG/ML IJ SOLN
INTRAMUSCULAR | Status: AC
  Filled 2018-11-15: qty 1

## 2018-11-15 MED ORDER — BUPIVACAINE IN DEXTROSE 0.75-8.25 % IT SOLN
INTRATHECAL | Status: DC | PRN
  Administered 2018-11-15: 1.6 mL via INTRATHECAL

## 2018-11-15 MED ORDER — BUPIVACAINE HCL 0.5 % IJ SOLN
INTRAMUSCULAR | Status: DC | PRN
  Administered 2018-11-15: 10 mL

## 2018-11-15 MED ORDER — MISOPROSTOL 200 MCG PO TABS
ORAL_TABLET | ORAL | Status: AC
  Filled 2018-11-15: qty 5

## 2018-11-15 MED ORDER — DIPHENHYDRAMINE HCL 25 MG PO CAPS
25.0000 mg | ORAL_CAPSULE | Freq: Four times a day (QID) | ORAL | Status: DC | PRN
  Administered 2018-11-16: 25 mg via ORAL
  Filled 2018-11-15: qty 1

## 2018-11-15 MED ORDER — ACETAMINOPHEN 500 MG PO TABS
1000.0000 mg | ORAL_TABLET | Freq: Four times a day (QID) | ORAL | Status: DC
  Administered 2018-11-15 – 2018-11-17 (×10): 1000 mg via ORAL
  Filled 2018-11-15 (×10): qty 2

## 2018-11-15 MED ORDER — WITCH HAZEL-GLYCERIN EX PADS: 1.0000 "application " | MEDICATED_PAD | CUTANEOUS | Status: DC | PRN

## 2018-11-15 MED ORDER — ONDANSETRON HCL 4 MG/2ML IJ SOLN
INTRAMUSCULAR | Status: AC
  Filled 2018-11-15: qty 2

## 2018-11-15 MED ORDER — OXYTOCIN 40 UNITS IN NORMAL SALINE INFUSION - SIMPLE MED
2.5000 [IU]/h | INTRAVENOUS | Status: DC
  Administered 2018-11-15: 2.5 [IU]/h via INTRAVENOUS
  Filled 2018-11-15: qty 1000

## 2018-11-15 MED ORDER — NALBUPHINE HCL 10 MG/ML IJ SOLN
10.0000 mg | INTRAMUSCULAR | Status: DC | PRN
  Administered 2018-11-15: 10 mg via INTRAVENOUS

## 2018-11-15 MED ORDER — FLEET ENEMA 7-19 GM/118ML RE ENEM: 1.0000 | ENEMA | Freq: Every day | RECTAL | Status: DC | PRN

## 2018-11-15 MED ORDER — LEVOTHYROXINE SODIUM 75 MCG PO TABS
75.0000 ug | ORAL_TABLET | Freq: Every day | ORAL | Status: DC
  Administered 2018-11-15 – 2018-11-17 (×3): 75 ug via ORAL
  Filled 2018-11-15 (×3): qty 1

## 2018-11-15 MED ORDER — METOPROLOL SUCCINATE ER 25 MG PO TB24
50.0000 mg | ORAL_TABLET | Freq: Every day | ORAL | Status: DC
  Administered 2018-11-15 – 2018-11-17 (×3): 50 mg via ORAL
  Filled 2018-11-15 (×4): qty 2

## 2018-11-15 MED ORDER — FENTANYL CITRATE (PF) 100 MCG/2ML IJ SOLN: 50.0000 ug | INTRAMUSCULAR | Status: DC | PRN

## 2018-11-15 MED ORDER — SIMETHICONE 80 MG PO CHEW
80.0000 mg | CHEWABLE_TABLET | ORAL | Status: DC
  Administered 2018-11-16: 80 mg via ORAL
  Filled 2018-11-15: qty 1

## 2018-11-15 MED ORDER — ONDANSETRON 4 MG PO TBDP
4.0000 mg | ORAL_TABLET | Freq: Three times a day (TID) | ORAL | Status: DC | PRN
  Administered 2018-11-15 – 2018-11-16 (×2): 4 mg via ORAL
  Filled 2018-11-15 (×3): qty 1

## 2018-11-15 MED ORDER — SIMETHICONE 80 MG PO CHEW: 80.0000 mg | CHEWABLE_TABLET | ORAL | Status: DC | PRN

## 2018-11-15 MED ORDER — FENTANYL CITRATE (PF) 100 MCG/2ML IJ SOLN
INTRAMUSCULAR | Status: DC | PRN
  Administered 2018-11-15: 15 ug via INTRATHECAL

## 2018-11-15 MED ORDER — METHYLERGONOVINE MALEATE 0.2 MG/ML IJ SOLN
INTRAMUSCULAR | Status: AC
  Filled 2018-11-15: qty 1

## 2018-11-15 MED ORDER — PHENYLEPHRINE HCL (PRESSORS) 10 MG/ML IV SOLN
INTRAVENOUS | Status: DC | PRN
  Administered 2018-11-15: 100 ug via INTRAVENOUS

## 2018-11-15 MED ORDER — PRENATAL MULTIVITAMIN CH
1.0000 | ORAL_TABLET | Freq: Every day | ORAL | Status: DC
  Administered 2018-11-15 – 2018-11-17 (×3): 1 via ORAL
  Filled 2018-11-15 (×3): qty 1

## 2018-11-15 MED ORDER — MENTHOL 3 MG MT LOZG
1.0000 | LOZENGE | OROMUCOSAL | Status: DC | PRN
  Filled 2018-11-15: qty 9

## 2018-11-15 MED ORDER — OXYCODONE HCL 5 MG PO TABS
5.0000 mg | ORAL_TABLET | ORAL | Status: DC | PRN
  Administered 2018-11-15 (×2): 10 mg via ORAL
  Filled 2018-11-15 (×2): qty 2

## 2018-11-15 MED ORDER — ZOLPIDEM TARTRATE 5 MG PO TABS: 5.0000 mg | ORAL_TABLET | Freq: Every evening | ORAL | Status: DC | PRN

## 2018-11-15 MED ORDER — FENTANYL CITRATE (PF) 100 MCG/2ML IJ SOLN
25.0000 ug | INTRAMUSCULAR | Status: DC | PRN
  Administered 2018-11-15 (×4): 25 ug via INTRAVENOUS
  Filled 2018-11-15: qty 2

## 2018-11-15 MED ORDER — LACTATED RINGERS IV SOLN: INTRAVENOUS | Status: DC

## 2018-11-15 MED ORDER — SIMETHICONE 80 MG PO CHEW
80.0000 mg | CHEWABLE_TABLET | Freq: Three times a day (TID) | ORAL | Status: DC
  Administered 2018-11-15 – 2018-11-17 (×8): 80 mg via ORAL
  Filled 2018-11-15 (×8): qty 1

## 2018-11-15 MED ORDER — SENNOSIDES-DOCUSATE SODIUM 8.6-50 MG PO TABS
2.0000 | ORAL_TABLET | ORAL | Status: DC
  Administered 2018-11-16 – 2018-11-17 (×2): 2 via ORAL
  Filled 2018-11-15 (×2): qty 2

## 2018-11-15 SURGICAL SUPPLY — 27 items
CANISTER SUCT 3000ML PPV (MISCELLANEOUS) ×2 IMPLANT
CHLORAPREP W/TINT 26 (MISCELLANEOUS) ×4 IMPLANT
DERMABOND ADVANCED (GAUZE/BANDAGES/DRESSINGS) ×1
DERMABOND ADVANCED .7 DNX12 (GAUZE/BANDAGES/DRESSINGS) ×1 IMPLANT
DRSG OPSITE POSTOP 4X10 (GAUZE/BANDAGES/DRESSINGS) ×2 IMPLANT
DRSG OPSITE POSTOP 4X14 (GAUZE/BANDAGES/DRESSINGS) ×2 IMPLANT
ELECT CAUTERY BLADE 6.4 (BLADE) ×2 IMPLANT
ELECT REM PT RETURN 9FT ADLT (ELECTROSURGICAL) ×2
ELECTRODE REM PT RTRN 9FT ADLT (ELECTROSURGICAL) ×1 IMPLANT
GLOVE BIOGEL PI IND STRL 6.5 (GLOVE) ×2 IMPLANT
GLOVE BIOGEL PI INDICATOR 6.5 (GLOVE) ×2
GOWN STRL REUS W/ TWL LRG LVL3 (GOWN DISPOSABLE) ×1 IMPLANT
GOWN STRL REUS W/ TWL XL LVL3 (GOWN DISPOSABLE) ×2 IMPLANT
GOWN STRL REUS W/TWL LRG LVL3 (GOWN DISPOSABLE) ×1
GOWN STRL REUS W/TWL XL LVL3 (GOWN DISPOSABLE) ×2
NS IRRIG 1000ML POUR BTL (IV SOLUTION) ×2 IMPLANT
PACK C SECTION AR (MISCELLANEOUS) ×2 IMPLANT
PAD OB MATERNITY 4.3X12.25 (PERSONAL CARE ITEMS) ×2 IMPLANT
PAD PREP 24X41 OB/GYN DISP (PERSONAL CARE ITEMS) ×2 IMPLANT
PENCIL SMOKE ULTRAEVAC 22 CON (MISCELLANEOUS) ×2 IMPLANT
RTRCTR C-SECT PINK 34CM XLRG (MISCELLANEOUS) ×2 IMPLANT
STRIP CLOSURE SKIN 1/2X4 (GAUZE/BANDAGES/DRESSINGS) ×2 IMPLANT
SUT MNCRL AB 4-0 PS2 18 (SUTURE) ×2 IMPLANT
SUT PLAIN 3-0 (SUTURE) ×2 IMPLANT
SUT VIC AB 0 CT1 36 (SUTURE) ×6 IMPLANT
SUT VIC AB 2-0 CT1 36 (SUTURE) ×2 IMPLANT
SYR 30ML LL (SYRINGE) ×4 IMPLANT

## 2018-11-15 NOTE — Transfer of Care (Signed)
Immediate Anesthesia Transfer of Care Note  Patient: Anita Massey  Procedure(s) Performed: REPEAT CESAREAN SECTION (N/A Abdomen)  Patient Location: Mother/Baby  Anesthesia Type:Spinal  Level of Consciousness: awake, alert  and oriented  Airway & Oxygen Therapy: Patient Spontanous Breathing  Post-op Assessment: Report given to RN and Post -op Vital signs reviewed and stable  Post vital signs: Reviewed and stable  Last Vitals:  Vitals Value Taken Time  BP 135/75 11/15/18 0543  Temp 36.5 C 11/15/18 0543  Pulse 81 11/15/18 0614  Resp 16 11/15/18 0543  SpO2 96 % 11/15/18 0614  Vitals shown include unvalidated device data.  Last Pain:  Vitals:   11/15/18 0543  TempSrc: Oral  PainSc: 4       Patients Stated Pain Goal: 0 ([id number])  Complications: No apparent anesthesia complications

## 2018-11-15 NOTE — Progress Notes (Signed)
Pt. States Nausea is relieved and H/A is relieved. Infant to NN so Mom can sleep as she c/o exhaustion. Will cont to follow closely.

## 2018-11-15 NOTE — Progress Notes (Signed)
HROB/NST at 37 weeks: CHTN on labetalol 100 mgm BID. Baby active. No vaginal bleeding. Irregular contractions BP today 130/80, trace proteinuria. NO headache, SOB, CP, RUQ pain NST reactive with baseline 150 and accelerations to  170s, moderate variability CS scheduled for 3 November. Unsure about contraception. Husband may want more children Breast feeding WIll get Preclampsia labs today including a PC ratio RTN 10/23 for NST and AFI Preeclampsia/ labor  Precautions Dalia Heading, CNM

## 2018-11-15 NOTE — Brief Op Note (Signed)
Cesarean Section Procedure Note 11/15/18  Pre-operative Diagnosis:  1. Preeclampsia with severe features  2. [redacted] week gestation Post-operative Diagnosis: same, delivered. Procedure: Repeat Low Transverse Cesarean Section  Surgeon: Adrian Prows MD   Assistant(s): None Anesthesia: Spinal Estimated Blood Loss: 500 cc, QBL pending Complications: None; patient tolerated the procedure well.  Disposition: PACU - hemodynamically stable. Condition: stable   Findings: A female infant in the cephalic presentation. Amniotic fluid - clear Birth weight: 6 lbs 10oz Apgars of 8 and 9.  Intact placenta with a three-vessel cord. Grossly normal uterus, tubes and ovaries bilaterally. No intraabdominal adhesions were noted.   Procedure Details    The patient was taken to operating room, identified as the correct patient and the procedure verified as C-Section Delivery. A time out was held and the above information confirmed. After induction of anesthesia, the patient was draped and prepped in the usual sterile manner. A Pfannenstiel incision was made and carried down through the subcutaneous tissue to the fascia. Fascial incision was made and extended transversely with the Mayo scissors. The fascia was separated from the underlying rectus tissue superiorly and inferiorly. The peritoneum was identified and entered bluntly. Peritoneal incision was extended longitudinally. A low transverse hysterotomy was made. The fetus was delivered atraumatically. The umbilical cord was clamped x2 and cut and the infant was handed to the awaiting pediatricians. The placenta was removed intact and appeared normal with a 3-vessel cord. The uterus was exteriorized and cleared of all clot and debris. The hysterotomy was closed with running sutures of 0 Vicryl suture. A second imbricating layer was placed with the same suture. Excellent hemostasis was observed.  The uterus was returned to the abdomen. The pelvis was irrigated and  again, excellent hemostasis was noted. The peritoneum was closed with a running stitch of 2-0 Vicryl. The On Q Pain pump System was then placed.  Trocars were placed through the abdominal wall into the subfascial space and these were used to thread the silver soaker cathaters into place.The rectus muscles were inspected and were hemostatic. The rectus fascia was then reapproximated with running sutures of 0-vicryl, with careful placement not to incorporate the cathaters. Subcutaneous tissues are then irrigated with saline and hemostasis assured with the bovie. The subcutaneous fat was approximated with 3-0 plain and a running stitch.  The skin was closed with 4-0 monocryl suture in a subcuticular fashion followed by skin adhesive. The cathaters are flushed each with 5 mL of Bupivicaine and stabilized into place with dressing. Instrument, sponge, and needle counts were correct prior to the abdominal closure and at the conclusion of the case.  The patient tolerated the procedure well and was transferred to the recovery room in stable condition.   Homero Fellers MD [facility] 11/15/18 1:49 AM

## 2018-11-15 NOTE — Progress Notes (Signed)
PACU medication orders for pt. Needed during system downtime. Verbal order from Dr. Ronelle Nigh, ANMD, to give 10 mg nubain IV q3-6 hrs.  Verified by Nicki Reaper in pharmacy. Dose verified by C. Joya Gaskins, RN.  Pt. Given medication and tolerated well. Itching decreased. Pt. Stable.

## 2018-11-15 NOTE — Progress Notes (Signed)
Admit Date: 11/14/2018 Today's Date: 11/15/2018  Post Partum Day 0  Subjective:  up ad lib and tolerating PO  Objective: Temp:  [97.2 F (36.2 C)-98.3 F (36.8 C)] 97.8 F (36.6 C) (11/01 1145) Pulse Rate:  [76-105] 81 (11/01 1145) Resp:  [15-20] 20 (11/01 1145) BP: (127-170)/(61-97) 128/75 (11/01 1145) SpO2:  [94 %-98 %] 97 % (11/01 1145) Weight:  [86.2 kg] 86.2 kg (10/31 1844)  Physical Exam:  General: alert Lochia: appropriate Uterine Fundus: firm Incision: healing well, no significant drainage, no dehiscence, no significant erythema, none DVT Evaluation: No evidence of DVT seen on physical exam.  Recent Labs    11/14/18 1908 11/15/18 0557  HGB 11.6* 10.9*  HCT 35.1* 33.0*    Assessment/Plan:  38 yo [id number] s/p RLTCS POD#0 1. CHTN with superimposed Preeclampsia- BP WNL, continue metoprolol . 2. A POS  3. Immunization: rubella immune, Varicella immune, TDAP up to date. 4. Breastfeeding without issue. 5. Contraception- not discussed . 6. Continue postpartum care.     LOS: 1 day   [place] 11/15/2018, 12:52 PM

## 2018-11-15 NOTE — Progress Notes (Signed)
C/O H/A and Nausea and need to void. VS assessed and Systolic sl. Elevated. No other PIH S/S. Pt. Assisted to bathroom and she voided a large amount. She had a steady,independent gait. Dr. Gilman Schmidt notified of Pt. C/O and VS reported to her. Dr. Gilman Schmidt states she will place order for SL Zofran. Will cont. To monitor Pt. Closely.

## 2018-11-15 NOTE — Progress Notes (Signed)
Assisted up to BR. Voided large amount of clear Urine. Steady Gait;tolerated well. Ate 6 Saltines and 1 can of Ginger Ale. Took medication s without difficulty. Refused Ibuprofen. Denies H/A and denies Nausea. States she desires more sleep. Infnat is still in NN and being cared for for C. Joya Gaskins RN so that Mom can recuperate.

## 2018-11-15 NOTE — Anesthesia Post-op Follow-up Note (Signed)
Anesthesia QCDR form completed.        

## 2018-11-15 NOTE — Progress Notes (Signed)
Discussed provider order to start magnesium sulfate again with patient post-operatively. Pt. Declines medication and has no further questions for the provider (Dr. Gilman Schmidt) at this time. Pt. has no headache. VSS with no severe-range pressures. Will continue to monitor.

## 2018-11-15 NOTE — Plan of Care (Signed)
Pt. Transferred to room 340 from L&D. Pt. Is alert and oriented with pleasant affect. Color good, skin w&d. BBS clear. Honeycomb Dressing with dime size serosang. Drainage LLQ. On Q pump draining Serosang. Drainage. Pt. Oriented to Room, Safety and Security and Fall Prevention. Pt. V/o.

## 2018-11-15 NOTE — Lactation Note (Signed)
This note was copied from a baby's chart. Lactation Consultation Note  Patient Name: Anita Massey [id number] Date: 11/15/2018   Lactation has been assisting with every breast feed today.  Anita Massey has a high arched palate with a questionable lip and tongue tie.  Earlier Anita Massey had been sleepy and had only been able to get him to take a few weak sucks at the breast.  For last feedings he has been awake and demonstrating feeding cues.  Anita Massey has slightly flat, large nipples with firm areola making it more difficult to compress.  Latch can be achieved and Anita Massey has a strong suction, but he was only able to sustain the latch for a short period.  Once #24 nipple shield was applied, he was able to maintain the latch for long intervals.  Anita Massey has an 38 year old that was born with cleft lip and palate and was unable to breast feed.  Anita Massey attempted to breast feed 38 year old without success.  Anita Massey reports raw bleeding nipples with 38 year old and gave up early.  Anita Massey really wants to breast feed this baby.  Explained feeding cues and encouraged Anita Massey to put [facility] to the breast whenever he demonstrated hunger cues.  Reviewed normal newborn stomach size, supply and demand, normal course of lactation and routine newborn feeding patterns.  Lactation name and number written on white board and encouraged to call with any questions, concerns or assistance.  Maternal Data Formula Feeding for Exclusion: No  Feeding Feeding Type: Bottle Fed - Formula Nipple Type: Slow - flow  LATCH Score                   Interventions    Lactation Tools Discussed/Used     Consult Status      Anita Massey 11/15/2018, 9:56 PM

## 2018-11-15 NOTE — Anesthesia Procedure Notes (Signed)
Spinal  Patient location during procedure: OR Start time: 11/15/2018 12:20 AM End time: 11/15/2018 12:26 AM Staffing Performed: anesthesiologist  Preanesthetic Checklist Completed: patient identified, site marked, surgical consent, pre-op evaluation, timeout performed, IV checked, risks and benefits discussed and monitors and equipment checked Spinal Block Patient position: sitting Prep: Betadine Patient monitoring: heart rate, continuous pulse ox, blood pressure and cardiac monitor Approach: midline Location: L4-5 Injection technique: single-shot Needle Needle type: Whitacre and Introducer  Needle gauge: 24 G Needle length: 9 cm Additional Notes Negative paresthesia. Negative blood return. Positive free-flowing CSF. Expiration date of kit checked and confirmed. Patient tolerated procedure well, without complications.

## 2018-11-15 NOTE — Progress Notes (Signed)
Pt. Assisted to BR and voided large amount of clear Urine. Lochia minimal. Steady Gait;tolerated well. VSS: B/P is 136/77.  Denies H/A, Denies Nausea. Stakes she only has incisional pain with movement and that this pain is,"tolerable."  Pt., states that she is in need of sleep. Infant is in NN so that Mom may have 2h uninterrupted sleep.

## 2018-11-16 MED ORDER — ONDANSETRON 4 MG PO TBDP
4.0000 mg | ORAL_TABLET | Freq: Four times a day (QID) | ORAL | Status: DC | PRN
  Administered 2018-11-16 – 2018-11-17 (×4): 4 mg via ORAL
  Filled 2018-11-16 (×3): qty 1

## 2018-11-16 NOTE — Anesthesia Post-op Follow-up Note (Signed)
  Anesthesia Pain Follow-up Note  Patient: Anita Massey  Day #: 2  Date of Follow-up: 11/16/2018 Time: 9:17 AM  Last Vitals:  Vitals:   11/16/18 0342 11/16/18 0843  BP: 129/67 (!) 152/82  Pulse: 84 93  Resp: 18   Temp: 36.7 C   SpO2: 98%     Level of Consciousness: alert  Pain: none   Side Effects:None  Catheter Site Exam:clean, dry, no drainage     Plan: Continue current therapy of postop epidural at surgeon's request  Caryl Asp

## 2018-11-16 NOTE — Anesthesia Postprocedure Evaluation (Signed)
Anesthesia Post Note  Patient: Anita Massey  Procedure(s) Performed: REPEAT CESAREAN SECTION (N/A Abdomen)  Patient location during evaluation: Mother Baby Anesthesia Type: Spinal Level of consciousness: oriented and awake and alert Pain management: pain level controlled Vital Signs Assessment: post-procedure vital signs reviewed and stable Respiratory status: spontaneous breathing and respiratory function stable Cardiovascular status: blood pressure returned to baseline and stable Postop Assessment: no headache, no backache, no apparent nausea or vomiting and able to ambulate Anesthetic complications: no     Last Vitals:  Vitals:   11/16/18 0342 11/16/18 0843  BP: 129/67 (!) 152/82  Pulse: 84 93  Resp: 18   Temp: 36.7 C   SpO2: 98%     Last Pain:  Vitals:   11/16/18 0342  TempSrc: Oral  PainSc: 0-No pain                 Caryl Asp

## 2018-11-16 NOTE — Progress Notes (Signed)
C/O Nausea. Zofran given as per PRN order. Denies any other c/o.

## 2018-11-16 NOTE — Progress Notes (Signed)
POD #1 Repeat LTCS and preeclampsia with severe features Subjective:   "I just don't feel well. I am nauseous." Has been nauseated off and on since last night. Blood pressures were normal postoperatively until this AM.  BP 152/82 prior to metaprolol. Used to take 100 mgm metaprolol daily before pregnancy. No headaches, CP, SOB. Urinating without difficulty and passing flatus. Keflex was restarted this AM.   Objective:  Blood pressure (!) 152/82, pulse 93, temperature 98.7 F (37.1 C), temperature source Oral, resp. rate 18, height 5\' 2"  (1.575 m), weight 86.2 kg, last menstrual period 02/17/2018, SpO2 98 %, currently breastfeeding. Urine output last 24 hours is 1625 ml.  General: WF in NAD, sitting up, looking at breakfast. Pulmonary: no increased work of breathing/ CTAB Heart: RRR without murmur Abdomen: non-distended, non-tender, fundus firm at level of umbilicus-1FB, BS active Incision: Honey comb dressing D&I; On Q dressing with serosanguinous drainage (one catheter pulled last night, allergic to [place], so nonewas used at base of catheters) Extremities: no edema, no erythema, no tenderness  Results for orders placed or performed during the hospital encounter of 11/14/18 (from the past 72 hour(s))  Protein / creatinine ratio, urine     Status: Abnormal   Collection Time: 11/14/18  6:30 PM  Result Value Ref Range   Creatinine, Urine 85 mg/dL   Total Protein, Urine 18 mg/dL    Comment: NO NORMAL RANGE ESTABLISHED FOR THIS TEST   Protein Creatinine Ratio 0.21 (H) 0.00 - 0.15 mg/mg[Cre]    Comment: Performed at [facility], [address] [place], [place] [address]  CBC     Status: Abnormal   Collection Time: 11/14/18  7:08 PM  Result Value Ref Range   WBC 11.2 (H) 4.0 - 10.5 K/uL   RBC 4.09 3.87 - 5.11 MIL/uL   Hemoglobin 11.6 (L) 12.0 - 15.0 g/dL   HCT 35.1 (L) 36.0 - 46.0 %   MCV 85.8 80.0 - 100.0 fL   MCH 28.4 26.0 - 34.0 pg   MCHC 33.0 30.0 - 36.0 g/dL   RDW  13.6 11.5 - 15.5 %   Platelets 310 150 - 400 K/uL   nRBC 0.0 0.0 - 0.2 %    Comment: Performed at [facility], [address] [place], [place] [address]  Type and screen [place]     Status: None   Collection Time: 11/14/18  7:08 PM  Result Value Ref Range   ABO/RH(D) A POS    Antibody Screen NEG    Sample Expiration      11/17/2018,2359 Performed at [facility], [address] [place], [place] [address]   Rapid HIV screen [facility] L&D dept ONLY)     Status: None   Collection Time: 11/14/18  7:08 PM  Result Value Ref Range   HIV-1 P24 Antigen - HIV24 NON REACTIVE NON REACTIVE    Comment: (NOTE) Detection of p24 may be inhibited by biotin in the sample, causing false negative results in acute infection.    HIV 1/2 Antibodies NON REACTIVE NON REACTIVE   Interpretation (HIV Ag Ab)      A non reactive test result means that HIV 1 or HIV 2 antibodies and HIV 1 p24 antigen were not detected in the specimen.    Comment: Performed at [facility], [address], [place],  [address]  Comprehensive metabolic panel     Status: Abnormal   Collection Time: 11/14/18  7:08 PM  Result Value Ref Range  Sodium 137 135 - 145 mmol/L   Potassium 3.8 3.5 - 5.1 mmol/L   Chloride 110 98 - 111 mmol/L   CO2 18 (L) 22 - 32 mmol/L   Glucose, Bld 103 (H) 70 - 99 mg/dL   BUN 9 6 - 20 mg/dL   Creatinine, Ser 0.52 0.44 - 1.00 mg/dL   Calcium 9.3 8.9 - 10.3 mg/dL   Total Protein 6.5 6.5 - 8.1 g/dL   Albumin 2.7 (L) 3.5 - 5.0 g/dL   AST 14 (L) 15 - 41 U/L   ALT 12 0 - 44 U/L   Alkaline Phosphatase 181 (H) 38 - 126 U/L   Total Bilirubin 0.4 0.3 - 1.2 mg/dL   GFR calc non Af Amer >60 >60 mL/min   GFR calc Af Amer >60 >60 mL/min   Anion gap 9 5 - 15    Comment: Performed at [facility], [address] [place], [place] [address]  RPR     Status: None   Collection Time: 11/14/18  7:08 PM  Result Value Ref Range   RPR Ser Ql NON  REACTIVE NON REACTIVE    Comment: Performed at [facility], [address] [place], [place] [address]  CBC     Status: Abnormal   Collection Time: 11/15/18  5:57 AM  Result Value Ref Range   WBC 16.8 (H) 4.0 - 10.5 K/uL   RBC 3.76 (L) 3.87 - 5.11 MIL/uL   Hemoglobin 10.9 (L) 12.0 - 15.0 g/dL   HCT 33.0 (L) 36.0 - 46.0 %   MCV 87.8 80.0 - 100.0 fL   MCH 29.0 26.0 - 34.0 pg   MCHC 33.0 30.0 - 36.0 g/dL   RDW 13.6 11.5 - 15.5 %   Platelets 262 150 - 400 K/uL   nRBC 0.0 0.0 - 0.2 %    Comment: Performed at [facility], [address] [place], [place] [address]  CBC     Status: Abnormal   Collection Time: 11/15/18  8:27 PM  Result Value Ref Range   WBC 14.3 (H) 4.0 - 10.5 K/uL   RBC 3.66 (L) 3.87 - 5.11 MIL/uL   Hemoglobin 10.6 (L) 12.0 - 15.0 g/dL   HCT 31.2 (L) 36.0 - 46.0 %   MCV 85.2 80.0 - 100.0 fL   MCH 29.0 26.0 - 34.0 pg   MCHC 34.0 30.0 - 36.0 g/dL   RDW 13.6 11.5 - 15.5 %   Platelets 255 150 - 400 K/uL   nRBC 0.0 0.0 - 0.2 %    Comment: Performed at [facility], [address], [place], [place] [address]     Assessment/Plan:   38 y.o. [id number] postoperativeday # 1  Nausea post operatively-maybe from po medications. Will discontinue  Keflex, since received Ancef IV prior to surgery and took 4 days of Keflex  prior to surgery for UTI  Give narcotic and ibuprofen with food.   Zofran for nausea CHTN with superimposed preeclampsia with severe features (refused magnesium sulfate)  Was normotensive postoperatively until this AM-had mild range blood pressure prior to metaprolol 50 mgm given. May need to increase metaprolol. No other symptoms of preeclampsia Other than nausea. Continue to monitor blood pressures closely  Mild anemia: prenatal vitamins with iron   A POS/ RI/ VI  TDAP-given AP    Breast/Bottle/Contraception ?  Dalia Heading, CNM

## 2018-11-16 NOTE — Lactation Note (Signed)
This note was copied from a baby's chart. Lactation Consultation Note  Patient Name: Anita Massey [id number] Date: 11/16/2018 Reason for consult: Follow-up assessment;Mother's request;Early term 37-38.6wks([name] has high arched palate & questionable lip & tongue tie)  Mom has requested assistance with every breast feed today even though most feedings she has latched [place] on her own.  FOB has been more supportive and helpful at last couple of feedings.  Mom has not felt good today with lots of nausea and pain.  [name] was wide awake and rooting at this feeding, but as soon as we got him to the breast he fell asleep.  Mom tearful reporting she is having really bad cramping and very hot.  Mom's face is flushed.  She reports she really wants to breast feed, but is hurting and cannot get him to stay awake to nurse even though he keeps fussing and passing gas whenever she puts him back in his crib.  Mom is requesting formula.  Mom agreed to give him a little bit of supplementation, but to give it at the breast.  He was sleepy.  We were having difficulty keeping #24 nipple shield on the breast.  Tried #20 nipple shield which seemed to work better with keeping seal on the breast and pulling nipple in more deeply.  Once some formula was put in tip of nipple shield and a few drops were given alongside of nipple shield via curved tip syringe, [name] started sucking vigorously.  We only had to give 10 ml initially and he kept sucking for 30 minutes intermittently without further stimulation or supplementation.  Mom was feeling better about this feeding and cramping was better after getting medication.    Maternal Data Formula Feeding for Exclusion: No Has patient been taught Hand Expression?: Yes Does the patient have breastfeeding experience prior to this delivery?: Yes  Feeding Feeding Type: Breast Fed  LATCH Score Latch: Repeated attempts needed to sustain latch, nipple held in mouth throughout feeding,  stimulation needed to elicit sucking reflex.  Audible Swallowing: A few with stimulation  Type of Nipple: Everted at rest and after stimulation  Comfort (Breast/Nipple): Soft / non-tender  Hold (Positioning): Assistance needed to correctly position infant at breast and maintain latch.  LATCH Score: 7  Interventions Interventions: Breast feeding basics reviewed;Assisted with latch;Skin to skin;Breast massage;Hand express;Reverse pressure;Breast compression;Adjust position;Support pillows;Position options  Lactation Tools Discussed/Used Tools: Nipple [place] [employer];Other (comment)(Curved tip syringe) Nipple shield size: 20 WIC Program: Yes   Consult Status Consult Status: Follow-up Follow-up type: Call as needed    Jarold Motto 11/16/2018, 9:56 PM

## 2018-11-17 ENCOUNTER — Encounter
Admission: RE | Disposition: A | Discharge status: Home / Self Care | Payer: Self-pay | Source: Home / Self Care | Attending: Obstetrics and Gynecology

## 2018-11-17 LAB — URINE CULTURE: Culture: NO GROWTH

## 2018-11-17 SURGERY — Surgical Case
Anesthesia: Choice

## 2018-11-17 MED ORDER — METOPROLOL SUCCINATE ER 100 MG PO TB24: 100.0000 mg | ORAL_TABLET | Freq: Every day | ORAL | 3 refills | Status: DC

## 2018-11-17 MED ORDER — METOPROLOL SUCCINATE ER 25 MG PO TB24
50.0000 mg | ORAL_TABLET | Freq: Once | ORAL | Status: AC
  Administered 2018-11-17: 50 mg via ORAL
  Filled 2018-11-17: qty 2

## 2018-11-17 MED ORDER — OXYCODONE HCL 5 MG PO TABS: 5.0000 mg | ORAL_TABLET | ORAL | 0 refills | Status: DC | PRN

## 2018-11-17 NOTE — Progress Notes (Signed)
Patient discharged home with infant. Discharge instructions and prescriptions given and reviewed with patient. Incision cleaning kit given and reviewed with patient who verbalized understanding. HTN s/s reviewed. Patient verbalized understanding. Escorted out by staff. 

## 2018-11-17 NOTE — Discharge Summary (Signed)
OB Discharge Summary     Patient Name: Anita Massey DOB: 1980-03-22 MRN: [id number]  Date of admission: 11/14/2018 Delivering MD: Adrian Prows, MD Date of Delivery: 11/14/2018  Date of discharge: 11/17/2018  Admitting diagnosis: Prior cesarean section Intrauterine pregnancy: [redacted]w[redacted]d     Secondary diagnosis: Preeclampsia     Discharge diagnosis: Term Pregnancy Delivered and [facility] with superimposed preeclampsia                     Hospital course:  Repeat C/S   38 y.o. yo [id number] at [redacted]w[redacted]d was admitted to the hospital 11/14/2018 for a cesarean section with the following indication:Elective Repeat and pre-eclampsia with severe features.  Membrane Rupture Time/Date: 12:52 PM ,11/15/2018   Patient delivered a Viable infant.11/15/2018  Details of the operation can be found in separate operative note.  Pateint had an uncomplicated postpartum course.  She is ambulating, tolerating a regular diet, passing flatus, and urinating well. Patient is discharged home in stable condition on  11/17/18                                                                        Post partum procedures: None  Complications: None  Physical exam on 11/17/2018: Vitals:   11/16/18 1926 11/16/18 2337 11/17/18 0301 11/17/18 0800  BP: 131/76 137/75 (!) 148/80 (!) 148/88  Pulse: 89 87 87 80  Resp: 16 18 18 18   Temp: 99.2 F (37.3 C) 98.7 F (37.1 C)  97.7 F (36.5 C)  TempSrc: Axillary Oral  Oral  SpO2: 98% 96% 100% 100%  Weight:      Height:       General: alert, cooperative and no distress Lochia: appropriate Uterine Fundus: firm Incision: Dressing is dry and intact (old drainage present) DVT Evaluation: No evidence of DVT seen on physical exam.  Labs: Lab Results  Component Value Date   WBC 14.3 (H) 11/15/2018   HGB 10.6 (L) 11/15/2018   HCT 31.2 (L) 11/15/2018   MCV 85.2 11/15/2018   PLT 255 11/15/2018   CMP Latest Ref Rng & Units 11/14/2018  Glucose 70 - 99 mg/dL 103(H)  BUN 6 - 20  mg/dL 9  Creatinine 0.44 - 1.00 mg/dL 0.52  Sodium 135 - 145 mmol/L 137  Potassium 3.5 - 5.1 mmol/L 3.8  Chloride 98 - 111 mmol/L 110  CO2 22 - 32 mmol/L 18(L)  Calcium 8.9 - 10.3 mg/dL 9.3  Total Protein 6.5 - 8.1 g/dL 6.5  Total Bilirubin 0.3 - 1.2 mg/dL 0.4  Alkaline Phos 38 - 126 U/L 181(H)  AST 15 - 41 U/L 14(L)  ALT 0 - 44 U/L 12    Discharge instruction: per After Visit Summary.  Medications:  Allergies as of 11/17/2018      Reactions   Biaxin [clarithromycin] Hives   Toradol [ketorolac Tromethamine]    Chest pain    Prednisone Anxiety, Palpitations   "Makes me feel crazy"   Morphine And Related    Chest Pain    Tape Rash   Paper tape is fine      Medication List    STOP taking these medications   cephALEXin 500 MG capsule Commonly known as: KEFLEX   folic acid 1 MG tablet  Commonly known as: FOLVITE   labetalol 100 MG tablet Commonly known as: NORMODYNE     TAKE these medications   alum & mag hydroxide-simeth 200-200-20 MG/5ML suspension Commonly known as: MAALOX/MYLANTA Take 20 mLs by mouth daily as needed for indigestion or heartburn.   calcium carbonate 500 MG chewable tablet Commonly known as: TUMS - dosed in mg elemental calcium Chew 2 tablets by mouth daily as needed for indigestion or heartburn.   levothyroxine 75 MCG tablet Commonly known as: SYNTHROID Take 1 tablet (75 mcg total) by mouth daily before breakfast.   metoprolol succinate 100 MG 24 hr tablet Commonly known as: Toprol XL Take 1 tablet (100 mg total) by mouth daily. Take with or immediately following a meal.   oxyCODONE 5 MG immediate release tablet Commonly known as: Roxicodone Take 1 tablet (5 mg total) by mouth every 4 (four) hours as needed for severe pain.            Discharge Care Instructions  (From admission, onward)         Start     Ordered   11/17/18 0000  Discharge wound care:    Comments: You may apply a light dressing for minor discharge from the  incision or to keep waistbands of clothing from rubbing.  You may also have been discharge with a clear dressing in which case this will be removed at your postoperative clinic visit.  You may shower, use soap on your incision.  Avoid baths or soaking the incision in the first 6 weeks following your surgery..   11/17/18 1012          Diet: routine diet  Activity: Advance as tolerated. Pelvic rest for 6 weeks.   Outpatient follow up: Follow-up Information    Schuman, Stefanie Libel, MD In 1 week.   Specialty: Obstetrics and Gynecology Why: For incision check Contact information: [address]. [place] [address] [phone]             Postpartum contraception: Undecided, possibly interval BTL Rhogam Given postpartum: no Rubella vaccine given postpartum: no Varicella vaccine given postpartum: no TDaP given antepartum or postpartum: Yes, antepartum on 09/18/2018  Newborn Data: Live born female  Birth Weight: 6 lb 10.2 oz (3010 g) APGAR: 8, 9  Newborn Delivery   Birth date/time: 11/15/2018 00:53:00 Delivery type: C-Section, Low Transverse Trial of labor: No C-section categorization: Repeat       Baby Feeding: Breast and formula  Disposition: home with mother  SIGNED:  Rexene Agent, CNM 11/17/2018 10:13 AM

## 2018-11-17 NOTE — Discharge Instructions (Signed)
Please call your doctor or return to the ER if you experience any chest pains, shortness of breath, dizziness, visual changes, severe headache (unrelieved by pain meds), fever greater than 101, any heavy bleeding (saturating more than 1 pad per hour), large clots, or foul smelling discharge, any worsening abdominal pain and cramping that is not controlled by pain medication, any calf/leg pain or redness, any breast concerns (redness/pain), or any signs of postpartum depression. No tampons, enemas, douches, or sexual intercourse for 6 weeks. Also avoid tub baths, hot tubs, or swimming for 6 weeks.   Check your incision daily for any signs of infection such as redness, warmth, swelling, increased pain, or pus/foul smelling discharge   Activity: do not lift over 10 lbs for 6 weeks  No driving for 1-2 weeks  Pelvic rest for 6 weeks

## 2018-11-17 NOTE — Lactation Note (Signed)
This note was copied from a baby's chart. Lactation Consultation Note  Patient Name: Anita Massey [id number] Date: 11/17/2018 Reason for consult: Follow-up assessment  LC spoke with mom before discharge.  Mom was breastfeeding with baby [name]. SNS had been set-up with 34mL of formula, and baby placed independently in football position, turned in, on right breast.  Mom was using 83mm NS as well. Baby was actively sucking with strong rhythmic sucking, audible swallows, and did not appear to be in pain or discomfort. Baby's latest weight has a loss of 10%, discussed baby's stomach size, and ability to increase formula with next feed based on age, as mom continues to work on breast stimulation.  Mom plans to continue use of SNS and nipple shield for each feed, along with formula supplement, as long as needed until able to establish a plentiful milk supply for baby as he grows. Mom was able to verbalize the set-up of the SNS, parts/pieces, and cleaning post-use. She feels confident in continued use, and has no questions/concerns.  Mom utilizes [facility] services in [place], and is aware of outside resources for breastfeeding support, as well as outpatient lactation consult services available for her after discharge.   Maternal Data Formula Feeding for Exclusion: No Has patient been taught Hand Expression?: Yes Does the patient have breastfeeding experience prior to this delivery?: Yes  Feeding Feeding Type: Formula  LATCH Score Latch: Grasps breast easily, tongue down, lips flanged, rhythmical sucking.  Audible Swallowing: Spontaneous and intermittent(formula; SNS)  Type of Nipple: Everted at rest and after stimulation  Comfort (Breast/Nipple): Soft / non-tender  Hold (Positioning): Assistance needed to correctly position infant at breast and maintain latch.(help with SNS set-up)  LATCH Score: 9  Interventions Interventions: Breast feeding basics reviewed  Lactation Tools  Discussed/Used Tools: Supplemental Nutrition System;Nipple Shields Nipple shield size: 20 WIC Program: Yes   Consult Status Consult Status: Complete Date: 11/17/18 Follow-up type: Call as needed    Lavonia Drafts 11/17/2018, 12:01 PM

## 2018-11-19 ENCOUNTER — Ambulatory Visit (INDEPENDENT_AMBULATORY_CARE_PROVIDER_SITE_OTHER): Payer: Medicaid Other | Admitting: Certified Nurse Midwife

## 2018-11-19 ENCOUNTER — Other Ambulatory Visit: Payer: Self-pay | Admitting: Obstetrics & Gynecology

## 2018-11-19 ENCOUNTER — Other Ambulatory Visit: Payer: Self-pay

## 2018-11-19 ENCOUNTER — Emergency Department
Admission: EM | Admit: 2018-11-19 | Discharge: 2018-11-19 | Disposition: A | Discharge status: Home / Self Care | Payer: Medicaid Other | Attending: Student in an Organized Health Care Education/Training Program | Admitting: Student in an Organized Health Care Education/Training Program

## 2018-11-19 ENCOUNTER — Encounter: Payer: Self-pay | Admitting: Certified Nurse Midwife

## 2018-11-19 ENCOUNTER — Telehealth: Payer: Self-pay

## 2018-11-19 VITALS — BP 140/80 | HR 66 | Ht 62.0 in | Wt 183.0 lb

## 2018-11-19 DIAGNOSIS — Z87891 Personal history of nicotine dependence: Secondary | ICD-10-CM | POA: Diagnosis not present

## 2018-11-19 DIAGNOSIS — R030 Elevated blood-pressure reading, without diagnosis of hypertension: Secondary | ICD-10-CM

## 2018-11-19 DIAGNOSIS — R0789 Other chest pain: Secondary | ICD-10-CM | POA: Diagnosis not present

## 2018-11-19 DIAGNOSIS — O115 Pre-existing hypertension with pre-eclampsia, complicating the puerperium: Secondary | ICD-10-CM

## 2018-11-19 DIAGNOSIS — I10 Essential (primary) hypertension: Secondary | ICD-10-CM | POA: Diagnosis not present

## 2018-11-19 DIAGNOSIS — R519 Headache, unspecified: Secondary | ICD-10-CM | POA: Diagnosis not present

## 2018-11-19 DIAGNOSIS — Z4889 Encounter for other specified surgical aftercare: Secondary | ICD-10-CM

## 2018-11-19 DIAGNOSIS — O1093 Unspecified pre-existing hypertension complicating the puerperium: Secondary | ICD-10-CM

## 2018-11-19 DIAGNOSIS — Z79899 Other long term (current) drug therapy: Secondary | ICD-10-CM | POA: Insufficient documentation

## 2018-11-19 DIAGNOSIS — E039 Hypothyroidism, unspecified: Secondary | ICD-10-CM | POA: Diagnosis not present

## 2018-11-19 LAB — URINALYSIS, COMPLETE (UACMP) WITH MICROSCOPIC
Bacteria, UA: NONE SEEN
Bilirubin Urine: NEGATIVE
Glucose, UA: NEGATIVE mg/dL
Ketones, ur: NEGATIVE mg/dL
Leukocytes,Ua: NEGATIVE
Nitrite: NEGATIVE
Protein, ur: NEGATIVE mg/dL
Specific Gravity, Urine: 1.011 (ref 1.005–1.030)
pH: 5 (ref 5.0–8.0)

## 2018-11-19 LAB — CBC WITH DIFFERENTIAL/PLATELET
Abs Immature Granulocytes: 0.03 10*3/uL (ref 0.00–0.07)
Basophils Absolute: 0 10*3/uL (ref 0.0–0.1)
Basophils Relative: 0 %
Eosinophils Absolute: 0.2 10*3/uL (ref 0.0–0.5)
Eosinophils Relative: 1 %
HCT: 33.5 % — ABNORMAL LOW (ref 36.0–46.0)
Hemoglobin: 11 g/dL — ABNORMAL LOW (ref 12.0–15.0)
Immature Granulocytes: 0 %
Lymphocytes Relative: 21 %
Lymphs Abs: 2.3 10*3/uL (ref 0.7–4.0)
MCH: 28.6 pg (ref 26.0–34.0)
MCHC: 32.8 g/dL (ref 30.0–36.0)
MCV: 87.2 fL (ref 80.0–100.0)
Monocytes Absolute: 0.7 10*3/uL (ref 0.1–1.0)
Monocytes Relative: 7 %
Neutro Abs: 7.9 10*3/uL — ABNORMAL HIGH (ref 1.7–7.7)
Neutrophils Relative %: 71 %
Platelets: 366 10*3/uL (ref 150–400)
RBC: 3.84 MIL/uL — ABNORMAL LOW (ref 3.87–5.11)
RDW: 13.5 % (ref 11.5–15.5)
WBC: 11.1 10*3/uL — ABNORMAL HIGH (ref 4.0–10.5)
nRBC: 0 % (ref 0.0–0.2)

## 2018-11-19 LAB — COMPREHENSIVE METABOLIC PANEL
ALT: 36 U/L (ref 0–44)
AST: 24 U/L (ref 15–41)
Albumin: 2.9 g/dL — ABNORMAL LOW (ref 3.5–5.0)
Alkaline Phosphatase: 167 U/L — ABNORMAL HIGH (ref 38–126)
Anion gap: 11 (ref 5–15)
BUN: 14 mg/dL (ref 6–20)
CO2: 21 mmol/L — ABNORMAL LOW (ref 22–32)
Calcium: 9.1 mg/dL (ref 8.9–10.3)
Chloride: 107 mmol/L (ref 98–111)
Creatinine, Ser: 0.62 mg/dL (ref 0.44–1.00)
GFR calc Af Amer: >60 mL/min (ref >60)
GFR calc non Af Amer: >60 mL/min (ref >60)
Glucose, Bld: 97 mg/dL (ref 70–99)
Potassium: 3.8 mmol/L (ref 3.5–5.1)
Sodium: 139 mmol/L (ref 135–145)
Total Bilirubin: 0.7 mg/dL (ref 0.3–1.2)
Total Protein: 6.7 g/dL (ref 6.5–8.1)

## 2018-11-19 LAB — URIC ACID: Uric Acid, Serum: 5.8 mg/dL (ref 2.5–7.1)

## 2018-11-19 LAB — TROPONIN I (HIGH SENSITIVITY): Troponin I (High Sensitivity): 8 ng/L (ref <18)

## 2018-11-19 MED ORDER — AMLODIPINE BESYLATE 5 MG PO TABS
10.0000 mg | ORAL_TABLET | Freq: Once | ORAL | Status: AC
  Administered 2018-11-19: 10 mg via ORAL
  Filled 2018-11-19: qty 2

## 2018-11-19 MED ORDER — SILVER SULFADIAZINE 1 % EX CREA: TOPICAL_CREAM | CUTANEOUS | 0 refills | Status: DC

## 2018-11-19 MED ORDER — HYDROCHLOROTHIAZIDE 12.5 MG PO CAPS: 12.5000 mg | ORAL_CAPSULE | Freq: Every day | ORAL | 1 refills | Status: DC

## 2018-11-19 MED ORDER — AMLODIPINE BESYLATE 10 MG PO TABS: 10.0000 mg | ORAL_TABLET | Freq: Every day | ORAL | 1 refills | Status: DC

## 2018-11-19 NOTE — ED Triage Notes (Signed)
Pt delivered 11/1 by c section and was diagnosed with preeclampsia at the end of her pregnancy. Here for bp of 167/100 at home and was told to come her by obgyn.

## 2018-11-19 NOTE — Telephone Encounter (Signed)
Pt called triage yesterday reporting high BP. Pt has a appointment today at [facility] with [place]

## 2018-11-19 NOTE — Consult Note (Signed)
Obstetrics & Gynecology History and Physical Note  Date of Consultation: 11/19/2018   Requesting Provider: [facility]  Primary OBGYN: [facility] Primary Care Provider: Viviana Simpler I  Reason for Consultation: HTN post partum  History of Present Illness: Ms. Dybdahl is a 38 y.o. [phone] (No LMP recorded (lmp unknown).), with the above CC. She delivered 4 days ago by CS, was dx w preeclampsia at the time, also has cHTN (taking Metoprolol pre pregnancy).  She did not receive magnesium while in hospital (refused). She was discharged w stable BPs, on Metoprolol.  Since that time she has noted headache, abdominal pain, CP.  No edema.  She was seen in office today and HCTZ 12.5 mg was added.  She came tonight due to sx's and as she was told to do so.    ROS: A review of systems was performed and was complete and comprehensive, except as stated in the above HPI.  OBGYN History: As per HPI. OB History    Gravida  6   Para  3   Term  3   Preterm      AB  3   Living  3     SAB  3   TAB      Ectopic      Multiple  0   Live Births  3            Past Medical History: Past Medical History:  Diagnosis Date  . Anemia   . Anxiety state, unspecified   . 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 Clotting disorder ([place])   . GERD (gastroesophageal reflux disease)    OCC  . 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 Hypertension   . Hypothyroidism   . Multiple joint pain 09/04/2015  . Obesity   . Rosacea   . Undiagnosed cardiac murmurs   . Unspecified essential hypertension     Past Surgical History: Past Surgical History:  Procedure Laterality Date  . APPENDECTOMY  2009   [facility]  . CESAREAN SECTION  2016  . CESAREAN SECTION N/A 11/15/2018   Procedure: REPEAT CESAREAN SECTION;  Surgeon: Homero Fellers, MD;  Location: [facility] ORS;  Service: Obstetrics;  Laterality: N/A;  Female born @ 86 Apgars: 8/9 Weight: 6lbs 10 ozs  . CHOLECYSTECTOMY N/A  03/20/2016   Procedure: LAPAROSCOPIC CHOLECYSTECTOMY;  Surgeon: Jules Husbands, MD;  Location: [facility] ORS;  Service: General;  Laterality: N/A;  . DILATION AND CURETTAGE OF UTERUS      Family History:  Family History  Problem Relation Age of Onset  . Mitral valve prolapse Mother   . Arrhythmia Mother   . Hypertension Mother   . Diabetes Mother   . Hypertension Father   . Hyperlipidemia Father   . Heart disease Maternal Grandfather   . Lung cancer Maternal Grandfather   . Cancer Maternal Grandfather   . Diabetes Brother   . Heart disease Maternal Aunt   . Diabetes Maternal Aunt   . Depression Maternal Aunt   . Cancer Maternal Grandmother        ovarian melanoma   She denies any female cancers, bleeding or blood clotting disorders.   Social History:  Social History   Socioeconomic History  . Marital status: Married    Spouse name: Harrell Gave  . Number of children: 2  . Years of education: Not on file  . Highest education level: Not on file  Occupational History  . Occupation: Agricultural engineer  Social Needs  . Financial resource strain: Not hard at all  . Food insecurity    Worry: Never true    Inability: Never true  . Transportation needs    Medical: No    Non-medical: No  Tobacco Use  . Smoking status: Former Smoker    Packs/day: 0.25    Years: 1.00    Pack years: 0.25    Types: Cigarettes    Quit date: 03/13/1998    Years since quitting: 20.7  . Smokeless tobacco: Never Used  Substance and Sexual Activity  . Alcohol use: No  . Drug use: No  . Sexual activity: Not Currently    Birth control/protection: None    Comment: undecided/considering tubal  Lifestyle  . Physical activity    Days per week: 0 days    Minutes per session: 0 min  . Stress: Very much  Relationships  . Social connections    Talks on phone: More than three times a week    Gets together: More than three times a week    Attends religious service: Patient refused    Active member of club or  organization: Patient refused    Attends meetings of clubs or organizations: Patient refused    Relationship status: Married  . Intimate partner violence    Fear of current or ex partner: No    Emotionally abused: No    Physically abused: No    Forced sexual activity: No  Other Topics Concern  . Not on file  Social History Narrative           Allergy: Allergies  Allergen Reactions  . Biaxin [Clarithromycin] Hives  . Toradol [Ketorolac Tromethamine]     Chest pain   . Prednisone Anxiety and Palpitations    "Makes me feel crazy"  . Morphine And Related     Chest Pain   . Tape Rash    Paper tape is fine    Current Outpatient Medications: (Not in a hospital admission)   Hospital Medications: Current Facility-Administered Medications  Medication Dose Route Frequency Provider Last Rate Last Dose  . amLODipine (NORVASC) tablet 10 mg  10 mg Oral Once Merlyn Lot, MD       Current Outpatient Medications  Medication Sig Dispense Refill  . alum & mag hydroxide-simeth (MAALOX/MYLANTA) 200-200-20 MG/5ML suspension Take 20 mLs by mouth daily as needed for indigestion or heartburn.     . calcium carbonate (TUMS - DOSED IN MG ELEMENTAL CALCIUM) 500 MG chewable tablet Chew 2 tablets by mouth daily as needed for indigestion or heartburn.    . hydrochlorothiazide (MICROZIDE) 12.5 MG capsule Take 1 capsule (12.5 mg total) by mouth daily. 7 capsule 1  . levothyroxine (SYNTHROID) 75 MCG tablet Take 1 tablet (75 mcg total) by mouth daily before breakfast. 30 tablet 6  . metoprolol succinate (TOPROL XL) 100 MG 24 hr tablet Take 1 tablet (100 mg total) by mouth daily. Take with or immediately following a meal. 30 tablet 3  . oxyCODONE (ROXICODONE) 5 MG immediate release tablet Take 1 tablet (5 mg total) by mouth every 4 (four) hours as needed for severe pain. 20 tablet 0  . silver sulfADIAZINE (SILVADENE) 1 % cream Apply topically to abdominal area BID 50 g 0    Physical Exam: Vitals:    11/19/18 1910 11/19/18 1921 11/19/18 2113  BP: (!) 157/89  (!) 186/97  Pulse: 84  80  Resp: 17    Temp: 98.6 F (37 C)    TempSrc:  Oral    SpO2: 98%  99%  Weight:  82.6 kg   Height:  5\' 2"  (1.575 m)    Body mass index is 33.29 kg/m. Constitutional: Well nourished, well developed female in no acute distress.  HEENT: normal Neck:  Supple, normal appearance, and no thyromegaly  Cardiovascular:Regular rate and rhythm.  No murmurs, rubs or gallops. Respiratory:  Clear to auscultation bilateral. Normal respiratory effort Abdomen: positive bowel sounds and no masses, hernias; diffusely non tender to palpation, non distended Neuro: grossly intact Psych:  Normal mood and affect.  Skin:  Warm and dry.  MS: normal gait and normal bilateral lower extremity strength/ROM/symmetry Lymphatic:  No inguinal lymphadenopathy.   Recent Labs  Lab 11/15/18 0557 11/15/18 2027 11/19/18 2011  WBC 16.8* 14.3* 11.1*  HGB 10.9* 10.6* 11.0*  HCT 33.0* 31.2* 33.5*  PLT 262 255 366   Recent Labs  Lab 11/13/18 2130 11/14/18 1908 11/19/18 2011  NA 138 137 139  K 3.7 3.8 3.8  CL 109 110 107  CO2 18* 18* 21*  BUN 10 9 14   CREATININE 0.60 0.52 0.62  CALCIUM 9.2 9.3 9.1  PROT 6.8 6.5 6.7  BILITOT 0.4 0.4 0.7  ALKPHOS 195* 181* 167*  ALT 13 12 36  AST 15 14* 24  GLUCOSE 93 103* 97   Recent Labs  Lab 11/14/18 1908  ABORH A POS   Assessment: Ms. Torell is a 38 y.o. [id number] (No LMP recorded (lmp unknown).) who presented to the ED with complaints of sx's related to postpartum HTN; findings are consistent with HTN and no additional signs of preeclampsia. No proteinuria or changes in preeclampsia labs.  Plan: Cont Metoprolol but may need higher dose.  Will add Norvasc at this time and see how responds, then follow up as outpatient for adjustments.  No magnesium therapy warranted at this time.  Risks of stroke, heart attack, PE, seizure discussed related to high blood pressures  consequences.  Barnett Applebaum, MD, [facility], [facility] 11/19/2018  9:21 PM Pager [phone]

## 2018-11-19 NOTE — ED Notes (Signed)
Spoke with nurse on L&D and was told the provider said the pt is to be seen and evaluated in the ED for postpartum HTN

## 2018-11-19 NOTE — ED Provider Notes (Signed)
[facility] Emergency Department Provider Note    First MD Initiated Contact with Patient 11/19/18 2110     (approximate)  I have reviewed the triage vital signs and the nursing notes.   HISTORY  Chief Complaint Hypertension    HPI Anita Massey is a 38 y.o. female the [place] past medical history recent delivery and diagnosed with preeclampsia refusing magnesium therapy presents the ER for evaluation of   elevated blood pressure.  States she has had some mild chest discomfort and headaches.  No blurry vision.  Has been taking her labetalol but does not feel like it is working.  Denies any nausea or vomiting.   Past Medical History:  Diagnosis Date  . Anemia   . Anxiety state, unspecified   . 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 Clotting disorder ([place])   . GERD (gastroesophageal reflux disease)    OCC  . 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 Hypertension   . Hypothyroidism   . Multiple joint pain 09/04/2015  . Obesity   . Rosacea   . Undiagnosed cardiac murmurs   . Unspecified essential hypertension    Family History  Problem Relation Age of Onset  . Mitral valve prolapse Mother   . Arrhythmia Mother   . Hypertension Mother   . Diabetes Mother   . Hypertension Father   . Hyperlipidemia Father   . Heart disease Maternal Grandfather   . Lung cancer Maternal Grandfather   . Cancer Maternal Grandfather   . Diabetes Brother   . Heart disease Maternal Aunt   . Diabetes Maternal Aunt   . Depression Maternal Aunt   . Cancer Maternal Grandmother        ovarian melanoma   Past Surgical History:  Procedure Laterality Date  . APPENDECTOMY  2009   [facility]  . CESAREAN SECTION  2016  . CESAREAN SECTION N/A 11/15/2018   Procedure: REPEAT CESAREAN SECTION;  Surgeon: Homero Fellers, MD;  Location: [facility] ORS;  Service: Obstetrics;  Laterality: N/A;  Female born @ [birth year] Apgars: 8/9 Weight: 6lbs 10 ozs  .  CHOLECYSTECTOMY N/A 03/20/2016   Procedure: LAPAROSCOPIC CHOLECYSTECTOMY;  Surgeon: Jules Husbands, MD;  Location: [facility] ORS;  Service: General;  Laterality: N/A;  . DILATION AND CURETTAGE OF UTERUS     Patient Active Problem List   Diagnosis Date Noted  . Uterine scar from previous cesarean delivery affecting pregnancy 11/14/2018  . Hypertension in pregnancy, essential, antepartum, third trimester 11/13/2018  . Indication for care in labor and delivery, antepartum 10/28/2018  . Elevated blood pressure affecting pregnancy in third trimester, antepartum 10/13/2018  . Supervision of high risk pregnancy, antepartum 04/01/2018  . Hypothyroidism affecting pregnancy, antepartum 04/01/2018  . 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 MTHFR gene mutation ([place]) 03/31/2018  . Chronic hypertension in pregnancy 03/31/2018  . History of maternal third degree perineal laceration, currently pregnant 03/31/2018  . Antepartum multigravida of advanced maternal age 77/17/2020  . Preventative health care 01/21/2018  . IBS (irritable bowel syndrome) 09/19/2017  . Asthmatic bronchitis 11/08/2016  . Adrenal adenoma 09/11/2016  . Panic anxiety syndrome 07/23/2016  . Allergic urticaria   . Rosacea 08/15/2015  . 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 Palpitations 04/09/2011      Prior to Admission medications   Medication Sig Start Date End Date Taking? Authorizing Provider  alum & mag hydroxide-simeth (MAALOX/MYLANTA) 200-200-20 MG/5ML suspension Take 20 mLs by mouth daily as  needed for indigestion or heartburn.     [provider]  amLODipine (NORVASC) 10 MG tablet Take 1 tablet (10 mg total) by mouth daily. 11/19/18 11/19/19  Gae Dry, MD  calcium carbonate (TUMS - DOSED IN MG ELEMENTAL CALCIUM) 500 MG chewable tablet Chew 2 tablets by mouth daily as needed for indigestion or heartburn.    [provider]  hydrochlorothiazide (MICROZIDE) 12.5 MG capsule Take 1 capsule (12.5 mg total)  by mouth daily. 11/19/18   Dalia Heading, CNM  levothyroxine (SYNTHROID) 75 MCG tablet Take 1 tablet (75 mcg total) by mouth daily before breakfast. 09/03/18   Malachy Mood, MD  metoprolol succinate (TOPROL XL) 100 MG 24 hr tablet Take 1 tablet (100 mg total) by mouth daily. Take with or immediately following a meal. 11/17/18 03/17/19  Rexene Agent, CNM  oxyCODONE (ROXICODONE) 5 MG immediate release tablet Take 1 tablet (5 mg total) by mouth every 4 (four) hours as needed for severe pain. 11/17/18   Rexene Agent, CNM  silver sulfADIAZINE (SILVADENE) 1 % cream Apply topically to abdominal area BID 11/19/18   Dalia Heading, CNM    Allergies Biaxin [clarithromycin], Toradol [ketorolac tromethamine], Prednisone, Morphine and related, and Tape    Social History Social History   Tobacco Use  . Smoking status: Former Smoker    Packs/day: 0.25    Years: 1.00    Pack years: 0.25    Types: Cigarettes    Quit date: 03/13/1998    Years since quitting: 20.7  . Smokeless tobacco: Never Used  Substance Use Topics  . Alcohol use: No  . Drug use: No    Review of Systems Patient denies headaches, rhinorrhea, blurry vision, numbness, shortness of breath, chest pain, edema, cough, abdominal pain, nausea, vomiting, diarrhea, dysuria, fevers, rashes or hallucinations unless otherwise stated above in HPI. ____________________________________________   PHYSICAL EXAM:  VITAL SIGNS: Vitals:   11/19/18 2113 11/19/18 2213  BP: (!) 186/97 (!) 177/87  Pulse: 80 84  Resp:  20  Temp:  98.6 F (37 C)  SpO2: 99% 98%    Constitutional: Alert and oriented.  Eyes: Conjunctivae are normal.  Head: Atraumatic. Nose: No congestion/rhinnorhea. Mouth/Throat: Mucous membranes are moist.   Neck: No stridor. Painless ROM.  Cardiovascular: Normal rate, regular rhythm. Grossly normal heart sounds.  Good peripheral circulation. Respiratory: Normal respiratory effort.  No retractions. Lungs  CTAB. Gastrointestinal: Soft and nontender. No distention. No abdominal bruits. No CVA tenderness. Genitourinary:  Musculoskeletal: No lower extremity tenderness nor edema.  No joint effusions. Neurologic:  Normal speech and language. No gross focal neurologic deficits are appreciated. No facial droop Skin:  Skin is warm, dry and intact. No rash noted. Psychiatric: Mood and affect are normal. Speech and behavior are normal.  ____________________________________________   LABS (all labs ordered are listed, but only abnormal results are displayed)  Results for orders placed or performed during the hospital encounter of 11/19/18 (from the past 24 hour(s))  CBC with Differential     Status: Abnormal   Collection Time: 11/19/18  8:11 PM  Result Value Ref Range   WBC 11.1 (H) 4.0 - 10.5 K/uL   RBC 3.84 (L) 3.87 - 5.11 MIL/uL   Hemoglobin 11.0 (L) 12.0 - 15.0 g/dL   HCT 33.5 (L) 36.0 - 46.0 %   MCV 87.2 80.0 - 100.0 fL   MCH 28.6 26.0 - 34.0 pg   MCHC 32.8 30.0 - 36.0 g/dL   RDW 13.5 11.5 - 15.5 %  Platelets 366 150 - 400 K/uL   nRBC 0.0 0.0 - 0.2 %   Neutrophils Relative % 71 %   Neutro Abs 7.9 (H) 1.7 - 7.7 K/uL   Lymphocytes Relative 21 %   Lymphs Abs 2.3 0.7 - 4.0 K/uL   Monocytes Relative 7 %   Monocytes Absolute 0.7 0.1 - 1.0 K/uL   Eosinophils Relative 1 %   Eosinophils Absolute 0.2 0.0 - 0.5 K/uL   Basophils Relative 0 %   Basophils Absolute 0.0 0.0 - 0.1 K/uL   Immature Granulocytes 0 %   Abs Immature Granulocytes 0.03 0.00 - 0.07 K/uL  Comprehensive metabolic panel     Status: Abnormal   Collection Time: 11/19/18  8:11 PM  Result Value Ref Range   Sodium 139 135 - 145 mmol/L   Potassium 3.8 3.5 - 5.1 mmol/L   Chloride 107 98 - 111 mmol/L   CO2 21 (L) 22 - 32 mmol/L   Glucose, Bld 97 70 - 99 mg/dL   BUN 14 6 - 20 mg/dL   Creatinine, Ser 0.62 0.44 - 1.00 mg/dL   Calcium 9.1 8.9 - 10.3 mg/dL   Total Protein 6.7 6.5 - 8.1 g/dL   Albumin 2.9 (L) 3.5 - 5.0 g/dL   AST  24 15 - 41 U/L   ALT 36 0 - 44 U/L   Alkaline Phosphatase 167 (H) 38 - 126 U/L   Total Bilirubin 0.7 0.3 - 1.2 mg/dL   GFR calc non Af Amer >60 >60 mL/min   GFR calc Af Amer >60 >60 mL/min   Anion gap 11 5 - 15  Uric acid     Status: None   Collection Time: 11/19/18  8:11 PM  Result Value Ref Range   Uric Acid, Serum 5.8 2.5 - 7.1 mg/dL  Urinalysis, Complete w Microscopic     Status: Abnormal   Collection Time: 11/19/18  8:11 PM  Result Value Ref Range   Color, Urine STRAW (A) YELLOW   APPearance HAZY (A) CLEAR   Specific Gravity, Urine 1.011 1.005 - 1.030   pH 5.0 5.0 - 8.0   Glucose, UA NEGATIVE NEGATIVE mg/dL   Hgb urine dipstick MODERATE (A) NEGATIVE   Bilirubin Urine NEGATIVE NEGATIVE   Ketones, ur NEGATIVE NEGATIVE mg/dL   Protein, ur NEGATIVE NEGATIVE mg/dL   Nitrite NEGATIVE NEGATIVE   Leukocytes,Ua NEGATIVE NEGATIVE   RBC / HPF 0-5 0 - 5 RBC/hpf   WBC, UA 0-5 0 - 5 WBC/hpf   Bacteria, UA NONE SEEN NONE SEEN   Squamous Epithelial / LPF 0-5 0 - 5  Troponin I (High Sensitivity)     Status: None   Collection Time: 11/19/18  8:11 PM  Result Value Ref Range   Troponin I (High Sensitivity) 8 <18 ng/L   ____________________________________________  EKG My review and personal interpretation at Time: 21:23   Indication: chest pain  Rate: 70  Rhythm: sinus Axis: normal Other: no stemi, normal intervals,  ____________________________________________  RADIOLOGY  ____________________________________________   PROCEDURES  Procedure(s) performed:  Procedures    Critical Care performed: no ____________________________________________   INITIAL IMPRESSION / ASSESSMENT AND PLAN / ED COURSE  Pertinent labs & imaging results that were available during my care of the patient were reviewed by me and considered in my medical decision making (see chart for details).   DDX: pre-eclampsia, htnive urgency, acs, essential htn  TYJANAE CARRERAS is a 37 y.o. who presents to  the ED with elevated blood pressure and  symptoms as described above.  Patient well-appearing in no acute distress.  Presentation somewhat complicated by history of recent delivery.  Blood work however is reassuring does not appear consistent with preeclampsia.  Patient will be evaluated by Dr. Kenton Kingfisher at bedside.  Clinical Course as of Nov 19 2246  Thu Nov 19, 2018  2200 Patient reassessed.  Pain-free.  Troponin negative.  Blood pressure improved.  She is tolerating Norvasc and I think that she is appropriate for outpatient follow-up at this point as she does not have any evidence of preeclampsia or endorgan damage.  The patient will be placed on continuous pulse oximetry and telemetry for monitoring.  Laboratory evaluation will be sent to evaluate for the above complaints.      [PR]    Clinical Course User Index [PR] Merlyn Lot, MD    The patient was evaluated in Emergency Department today for the symptoms described in the history of present illness. He/she was evaluated in the context of the global COVID-19 pandemic, which necessitated consideration that the patient might be at risk for infection with the SARS-CoV-2 virus that causes COVID-19. Institutional protocols and algorithms that pertain to the evaluation of patients at risk for COVID-19 are in a state of rapid change based on information released by regulatory bodies including the CDC and federal and state organizations. These policies and algorithms were followed during the patient's care in the ED.  As part of my medical decision making, I reviewed the following data within the [place] notes reviewed and incorporated, Labs reviewed, notes from prior ED visits and [place] Controlled Substance Database   ____________________________________________   FINAL CLINICAL IMPRESSION(S) / ED DIAGNOSES  Final diagnoses:  Hypertension, unspecified type      NEW MEDICATIONS STARTED DURING THIS VISIT:   Discharge Medication List as of 11/19/2018 10:02 PM    START taking these medications   Details  amLODipine (NORVASC) 10 MG tablet Take 1 tablet (10 mg total) by mouth daily., Starting Thu 11/19/2018, Until Fri 11/19/2019, Normal         Note:  This document was prepared using Dragon voice recognition software and may include unintentional dictation errors.    Merlyn Lot, MD 11/19/18 2248

## 2018-11-20 NOTE — Telephone Encounter (Signed)
[facility] called [facility] to talk about lactation issues and was referred to me, mom has been supplementing with formula since d/c, no breastfeeding at all yesterday, pumped breasts today and obtained 10 cc breastmilk, wants to know how to increase milk production, I recommended pt to get baby back to breast at least 8 times in 24 hrs and pump breasts as needed, mom states she is very tired, can she just pump to increase supply and then breastfeed baby after supply is up?  I informed her that breastfeeding frequently is best way to increase production but that if she just wanted to pump, she would need to pump every 2-3 hrs x 15-20 min to increase production.  She had no further questions at the time and I encouraged her to call back for any further concerns or questions

## 2018-11-21 ENCOUNTER — Other Ambulatory Visit: Payer: Self-pay

## 2018-11-21 ENCOUNTER — Encounter: Payer: Self-pay | Admitting: Emergency Medicine

## 2018-11-21 ENCOUNTER — Emergency Department
Admission: EM | Admit: 2018-11-21 | Discharge: 2018-11-21 | Disposition: A | Discharge status: Home / Self Care | Payer: Medicaid Other | Attending: Emergency Medicine | Admitting: Emergency Medicine

## 2018-11-21 DIAGNOSIS — O165 Unspecified maternal hypertension, complicating the puerperium: Secondary | ICD-10-CM | POA: Diagnosis not present

## 2018-11-21 DIAGNOSIS — Z5321 Procedure and treatment not carried out due to patient leaving prior to being seen by health care provider: Secondary | ICD-10-CM | POA: Insufficient documentation

## 2018-11-21 NOTE — ED Triage Notes (Signed)
Patient states that she is 6 days postpartum. Patient states that she had preeclampsia during her pregnancy. Patient states that she was seen here last night for hypertension and started on a new medication. Patient states that tonight her bp was 167/105.

## 2018-11-23 ENCOUNTER — Encounter: Payer: Self-pay | Admitting: Obstetrics and Gynecology

## 2018-11-23 ENCOUNTER — Other Ambulatory Visit: Payer: Self-pay

## 2018-11-23 ENCOUNTER — Ambulatory Visit (INDEPENDENT_AMBULATORY_CARE_PROVIDER_SITE_OTHER): Payer: Medicaid Other | Admitting: Obstetrics and Gynecology

## 2018-11-23 ENCOUNTER — Telehealth: Payer: Self-pay | Admitting: Obstetrics and Gynecology

## 2018-11-23 DIAGNOSIS — O119 Pre-existing hypertension with pre-eclampsia, unspecified trimester: Secondary | ICD-10-CM

## 2018-11-23 NOTE — Telephone Encounter (Signed)
Patient is requesting refill on norvasc and also labetalol, 90 day supply if possible.  [place].

## 2018-11-23 NOTE — Progress Notes (Signed)
Patient ID: Anita Massey, female   DOB: 12/20/1980, 38 y.o.   MRN: [id number]  Reason for Consult: Postpartum Care (C/o HA's alot, some blurry vision )   Referred by Venia Carbon, MD  Subjective:     HPI:  Anita Massey is a 38 y.o. female. She is being seen for postpartum hypertesnion. She was seen over the weekend and started on Norvasc 10 mg. She has been taking the Norvasc as well as Labetalol 200 mg BID. The last two days her blood pressures have been well controlled at home. She reports reading on the home meter of 110's-120's/80-90's She reports her headaches have been better the last two days and is only mild now. Having some lightheaded feelings at home. BP cuff reads 10 above from office suff today at her visit.   Skin from Tegaderm injury is healing well with silvadene ointment.    Past Medical History:  Diagnosis Date  . Anemia   . Anxiety state, unspecified   . 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 Clotting disorder ([place])   . GERD (gastroesophageal reflux disease)    OCC  . 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 Hypertension   . Hypothyroidism   . Multiple joint pain 09/04/2015  . Obesity   . Rosacea   . Undiagnosed cardiac murmurs   . Unspecified essential hypertension    Family History  Problem Relation Age of Onset  . Mitral valve prolapse Mother   . Arrhythmia Mother   . Hypertension Mother   . Diabetes Mother   . Hypertension Father   . Hyperlipidemia Father   . Heart disease Maternal Grandfather   . Lung cancer Maternal Grandfather   . Cancer Maternal Grandfather   . Diabetes Brother   . Heart disease Maternal Aunt   . Diabetes Maternal Aunt   . Depression Maternal Aunt   . Cancer Maternal Grandmother        ovarian melanoma   Past Surgical History:  Procedure Laterality Date  . APPENDECTOMY  2009   [facility]  . CESAREAN SECTION  2016  . CESAREAN SECTION N/A 11/15/2018   Procedure: REPEAT CESAREAN  SECTION;  Surgeon: Homero Fellers, MD;  Location: [facility] ORS;  Service: Obstetrics;  Laterality: N/A;  Female born @ 32 Apgars: 8/9 Weight: 6lbs 10 ozs  . CHOLECYSTECTOMY N/A 03/20/2016   Procedure: LAPAROSCOPIC CHOLECYSTECTOMY;  Surgeon: Jules Husbands, MD;  Location: [facility] ORS;  Service: General;  Laterality: N/A;  . DILATION AND CURETTAGE OF UTERUS      Short Social History:  Social History   Tobacco Use  . Smoking status: Former Smoker    Packs/day: 0.25    Years: 1.00    Pack years: 0.25    Types: Cigarettes    Quit date: 03/13/1998    Years since quitting: 20.7  . Smokeless tobacco: Never Used  Substance Use Topics  . Alcohol use: No    Allergies  Allergen Reactions  . Biaxin [Clarithromycin] Hives  . Toradol [Ketorolac Tromethamine]     Chest pain   . Prednisone Anxiety and Palpitations    "Makes me feel crazy"  . Morphine And Related     Chest Pain   . Tape Rash    Paper tape is fine    Current Outpatient Medications  Medication Sig Dispense Refill  . alum & mag hydroxide-simeth (MAALOX/MYLANTA) 200-200-20 MG/5ML suspension Take 20 mLs by mouth daily as needed  for indigestion or heartburn.     Marland Kitchen amLODipine (NORVASC) 10 MG tablet Take 1 tablet (10 mg total) by mouth daily. 30 tablet 1  . aspirin EC 81 MG tablet Take 81 mg by mouth daily.    Marland Kitchen labetalol (NORMODYNE) 200 MG tablet Take 200 mg by mouth 2 (two) times daily.    Marland Kitchen levothyroxine (SYNTHROID) 75 MCG tablet Take 1 tablet (75 mcg total) by mouth daily before breakfast. 30 tablet 6  . silver sulfADIAZINE (SILVADENE) 1 % cream Apply topically to abdominal area BID 50 g 0  . calcium carbonate (TUMS - DOSED IN MG ELEMENTAL CALCIUM) 500 MG chewable tablet Chew 2 tablets by mouth daily as needed for indigestion or heartburn.    . hydrochlorothiazide (MICROZIDE) 12.5 MG capsule Take 1 capsule (12.5 mg total) by mouth daily. (Patient not taking: Reported on 11/23/2018) 7 capsule 1  . oxyCODONE (ROXICODONE) 5 MG  immediate release tablet Take 1 tablet (5 mg total) by mouth every 4 (four) hours as needed for severe pain. (Patient not taking: Reported on 11/23/2018) 20 tablet 0   No current facility-administered medications for this visit.     Review of Systems  Constitutional: Negative for chills, fatigue, fever and unexpected weight change.  HENT: Negative for trouble swallowing.  Eyes: Negative for loss of vision.  Respiratory: Negative for cough, shortness of breath and wheezing.  Cardiovascular: Negative for chest pain, leg swelling, palpitations and syncope.  GI: Negative for abdominal pain, blood in stool, diarrhea, nausea and vomiting.  GU: Negative for difficulty urinating, dysuria, frequency and hematuria.  Musculoskeletal: Negative for back pain, leg pain and joint pain.  Skin: Negative for rash.  Neurological: Negative for dizziness, headaches, light-headedness, numbness and seizures.  Psychiatric: Negative for behavioral problem, confusion, depressed mood and sleep disturbance.        Objective:  Objective   Vitals:   11/23/18 1317  BP: 138/82  Pulse: 98  Weight: 175 lb (79.4 kg)  Height: 5\' 2"  (1.575 m)   Body mass index is 32.01 kg/m.  Physical Exam Vitals signs and nursing note reviewed.  Constitutional:      Appearance: She is well-developed.  HENT:     Head: Normocephalic and atraumatic.  Eyes:     Pupils: Pupils are equal, round, and reactive to light.  Cardiovascular:     Rate and Rhythm: Normal rate and regular rhythm.  Pulmonary:     Effort: Pulmonary effort is normal. No respiratory distress.  Abdominal:     General: Abdomen is flat.     Palpations: Abdomen is soft.     Comments: Cesarean incision intact, healing well. No erythema or drainage.  Skin:    General: Skin is warm and dry.  Neurological:     Mental Status: She is alert and oriented to person, place, and time.  Psychiatric:        Behavior: Behavior normal.        Thought Content: Thought  content normal.        Judgment: Judgment normal.         Assessment/Plan:     38 yo with chronic hypertension, superimposed preeclampsia with moderate BP on medication.   Stop labetalol tomorrow and just take Norvsac once a day in the morning.   Continue to take BP 2 times a day.  Call with any elevated blood pressure values Follow up in 7 days.    Adrian Prows MD [facility], [facility] 11/23/2018 1:47 PM

## 2018-11-24 ENCOUNTER — Telehealth: Payer: Self-pay | Admitting: Certified Nurse Midwife

## 2018-11-24 NOTE — Telephone Encounter (Signed)
Pt would like a call back due to her BP numbers. At your request, sending message so you call her today.

## 2018-11-30 ENCOUNTER — Ambulatory Visit (INDEPENDENT_AMBULATORY_CARE_PROVIDER_SITE_OTHER): Payer: Medicaid Other | Admitting: Obstetrics and Gynecology

## 2018-11-30 ENCOUNTER — Other Ambulatory Visit: Payer: Self-pay

## 2018-11-30 ENCOUNTER — Encounter: Payer: Self-pay | Admitting: Obstetrics and Gynecology

## 2018-11-30 VITALS — BP 122/80 | Ht 62.0 in | Wt 176.0 lb

## 2018-11-30 DIAGNOSIS — O119 Pre-existing hypertension with pre-eclampsia, unspecified trimester: Secondary | ICD-10-CM

## 2018-11-30 DIAGNOSIS — N3001 Acute cystitis with hematuria: Secondary | ICD-10-CM

## 2018-11-30 LAB — POCT URINALYSIS DIPSTICK
Bilirubin, UA: NEGATIVE
Glucose, UA: NEGATIVE
Ketones, UA: NEGATIVE
Leukocytes, UA: NEGATIVE
Nitrite, UA: NEGATIVE
Protein, UA: POSITIVE — AB
Spec Grav, UA: 1.02 (ref 1.010–1.025)
Urobilinogen, UA: 0.2 E.U./dL
pH, UA: 5 (ref 5.0–8.0)

## 2018-11-30 MED ORDER — NITROFURANTOIN MONOHYD MACRO 100 MG PO CAPS: 100.0000 mg | ORAL_CAPSULE | Freq: Two times a day (BID) | ORAL | 0 refills | Status: AC

## 2018-11-30 NOTE — Progress Notes (Signed)
  OBSTETRICS POSTPARTUM CLINIC PROGRESS NOTE  Subjective:     Anita Massey is a 38 y.o. [phone] female who presents for a postpartum visit. She is 2 weeks postpartum following a Term pregnancy and delivery by C-section.  I have fully reviewed the prenatal and intrapartum course. Anesthesia: spinal.  Postpartum course has been complicated by complicated by hypertension.  Baby is feeding by Bottle.  Bleeding: patient has not  resumed menses.  Bowel function is normal. Bladder function is normal. Pain with urination.. Patient is not sexually active. Contraception method desired is IUD.  Postpartum depression screening: negative.   The following portions of the patient's history were reviewed and updated as appropriate: allergies, current medications, past family history, past medical history, past social history, past surgical history and problem list.  Review of Systems Pertinent items are noted in HPI.  Objective:    BP 122/80   Ht 5\' 2"  (1.575 m)   Wt 176 lb (79.8 kg)   LMP  (LMP Unknown)   Breastfeeding No   BMI 32.19 kg/m   General:  alert and no distress   Breasts:  inspection negative, no nipple discharge or bleeding, no masses or nodularity palpable  Lungs: clear to auscultation bilaterally  Heart:  regular rate and rhythm, S1, S2 normal, no murmur, click, rub or gallop  Abdomen: soft, non-tender; bowel sounds normal; no masses,  no organomegaly.   Well healed Pfannenstiel incision   Vulva:  normal  Vagina: normal vagina, no discharge, exudate, lesion, or erythema  Cervix:  no cervical motion tenderness and no lesions  Corpus: normal size, contour, position, consistency, mobility, non-tender  Adnexa:  normal adnexa and no mass, fullness, tenderness  Rectal Exam: Not performed.          Assessment:  Post Partum Care visit 1. Preeclampsia complicating hypertension  - Continue Norvasc q day- folllow up with PCP for continued management,  - POCT Urinalysis Dipstick  2. Acute cystitis with hematuria - nitrofurantoin, macrocrystal-monohydrate, (MACROBID) 100 MG capsule; Take 1 capsule (100 mg total) by mouth 2 (two) times daily for 5 days.  Dispense: 10 capsule; Refill: 0 - Urine Culture  3. Postpartum care following cesarean delivery    Plan:  See orders and Patient Instructions Follow up in: 4 weeks or as needed.   Considering IUD Continue norvasc once daily Reports mood has improved Incision healing well Will treat for UTI- urine culture sent  [place], [facility] 11/30/2018 10:40 AM

## 2018-12-01 ENCOUNTER — Emergency Department
Admission: EM | Admit: 2018-12-01 | Discharge: 2018-12-01 | Disposition: A | Discharge status: Home / Self Care | Payer: Medicaid Other | Attending: Emergency Medicine | Admitting: Emergency Medicine

## 2018-12-01 ENCOUNTER — Other Ambulatory Visit: Payer: Self-pay

## 2018-12-01 ENCOUNTER — Other Ambulatory Visit: Payer: Self-pay | Admitting: Obstetrics and Gynecology

## 2018-12-01 DIAGNOSIS — Z5321 Procedure and treatment not carried out due to patient leaving prior to being seen by health care provider: Secondary | ICD-10-CM | POA: Insufficient documentation

## 2018-12-01 DIAGNOSIS — N3001 Acute cystitis with hematuria: Secondary | ICD-10-CM | POA: Diagnosis not present

## 2018-12-01 DIAGNOSIS — R1031 Right lower quadrant pain: Secondary | ICD-10-CM | POA: Diagnosis present

## 2018-12-01 LAB — COMPREHENSIVE METABOLIC PANEL
ALT: 43 U/L (ref 0–44)
AST: 30 U/L (ref 15–41)
Albumin: 3.8 g/dL (ref 3.5–5.0)
Alkaline Phosphatase: 161 U/L — ABNORMAL HIGH (ref 38–126)
Anion gap: 10 (ref 5–15)
BUN: 10 mg/dL (ref 6–20)
CO2: 24 mmol/L (ref 22–32)
Calcium: 9.6 mg/dL (ref 8.9–10.3)
Chloride: 104 mmol/L (ref 98–111)
Creatinine, Ser: 0.61 mg/dL (ref 0.44–1.00)
GFR calc Af Amer: >60 mL/min (ref >60)
GFR calc non Af Amer: >60 mL/min (ref >60)
Glucose, Bld: 108 mg/dL — ABNORMAL HIGH (ref 70–99)
Potassium: 3.4 mmol/L — ABNORMAL LOW (ref 3.5–5.1)
Sodium: 138 mmol/L (ref 135–145)
Total Bilirubin: 0.4 mg/dL (ref 0.3–1.2)
Total Protein: 7.4 g/dL (ref 6.5–8.1)

## 2018-12-01 LAB — URINALYSIS, COMPLETE (UACMP) WITH MICROSCOPIC
Bacteria, UA: NONE SEEN
Bilirubin Urine: NEGATIVE
Glucose, UA: NEGATIVE mg/dL
Ketones, ur: NEGATIVE mg/dL
Nitrite: NEGATIVE
Protein, ur: NEGATIVE mg/dL
Specific Gravity, Urine: 1.006 (ref 1.005–1.030)
pH: 5 (ref 5.0–8.0)

## 2018-12-01 LAB — CBC
HCT: 39.8 % (ref 36.0–46.0)
Hemoglobin: 12.8 g/dL (ref 12.0–15.0)
MCH: 28.1 pg (ref 26.0–34.0)
MCHC: 32.2 g/dL (ref 30.0–36.0)
MCV: 87.3 fL (ref 80.0–100.0)
Platelets: 326 10*3/uL (ref 150–400)
RBC: 4.56 MIL/uL (ref 3.87–5.11)
RDW: 12.9 % (ref 11.5–15.5)
WBC: 9.8 10*3/uL (ref 4.0–10.5)
nRBC: 0 % (ref 0.0–0.2)

## 2018-12-01 LAB — LIPASE, BLOOD: Lipase: 23 U/L (ref 11–51)

## 2018-12-01 NOTE — ED Triage Notes (Signed)
Pt presents via POV c/p right lower groin/abd pain starting today. Reports s/p c section delivery 01/14/2018. Reports pre-eclampsia during pregnancy. Ambulatory to triage

## 2018-12-01 NOTE — ED Notes (Signed)
Pt reports leaving now due to long wait and will f/u with OB/Gyn provider tomorrow; instr to return for any new or worsening symptoms

## 2018-12-02 ENCOUNTER — Telehealth: Payer: Self-pay

## 2018-12-02 NOTE — Telephone Encounter (Signed)
Spoke w/patient. Advised of AMS response. She doesn't like/want to go to ER and sit for hours having a newborn she needs to take care of. She hasn't tried anything for the pain. I advised can try heat and Ibuprofen and see if that helps. I will check for UC results in the morning and send to provider in office for review. She will call after hours on call or go to ED if pain becomes unbearable.

## 2018-12-02 NOTE — Telephone Encounter (Signed)
Well perhaps staying in the ER might have been the right call.  I'd advise her if she is having significant pain to go back to the ER

## 2018-12-02 NOTE — Telephone Encounter (Signed)
Patient saw Gilman Schmidt 11/30/2018 & had blood & Protein in urine. Culture was sent, but not back yet. She is on Macrobid. She went to ER last night d/t right sided abdominal pain (ovary area). She left after 2 hours and wasn't seen. The pain has returned today for the first time. She's uncertain if it is UTI or cyst. [phone]

## 2018-12-03 NOTE — Telephone Encounter (Signed)
Chart reviewed. UC results not back yet.

## 2018-12-03 NOTE — Progress Notes (Signed)
History of Present Illness:  Anita Massey is a 38 y.o. G6 P3033 WF s/p repeat CS 11/15/2018. She was discharged [facility] had chronic hypertension with superimposed preeclampsia with severe features. She was discharged from the hospital 11/3 on metaprolol 100 mgm daily.  Current Outpatient Medications on File Prior to Visit  Medication Sig Dispense Refill  . levothyroxine (SYNTHROID) 75 MCG tablet Take 1 tablet (75 mcg total) by mouth daily before breakfast. 30 tablet 6  . oxyCODONE (ROXICODONE) 5 MG immediate release tablet Take 1 tablet (5 mg total) by mouth every 4 (four) hours as needed for severe pain. (Patient not taking: Reported on 11/23/2018) 20 tablet 0  . alum & mag hydroxide-simeth (MAALOX/MYLANTA) 200-200-20 MG/5ML suspension Take 20 mLs by mouth daily as needed for indigestion or heartburn.     . calcium carbonate (TUMS - DOSED IN MG ELEMENTAL CALCIUM) 500 MG chewable tablet Chew 2 tablets by mouth daily as needed for indigestion or heartburn.     No current facility-administered medications on file prior to visit.    . Since that time, she states that her blood pressures have been in the mild range at home and she has a headache that started last night. She has taken Tylenol and ibuprofen with some relief. She declined magnesium sulfate for the severe preeclampsia in the hospital. Other wise she feels like her incision is healing well and she is tolerating a regular diet and voiding without difficulty. Reports having a tape burn on her abdomen from the Tegaderm-she has blisters.  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 Also,  has a past surgical  history that includes Dilation and curettage of uterus; Appendectomy (2009); Cesarean section (2016); Cholecystectomy (N/A, 03/20/2016); and Cesarean section (N/A, 11/15/2018)., family history includes Arrhythmia in her mother; Cancer in her maternal grandfather and maternal grandmother; Depression in her maternal aunt; Diabetes in her brother, maternal aunt, and mother; Heart disease in her maternal aunt and maternal grandfather; Hyperlipidemia in her father; Hypertension in her father and mother; Lung cancer in her maternal grandfather; Mitral valve prolapse in her mother.,  reports that she quit smoking about 20 years ago. Her smoking use included cigarettes. She has a 0.25 pack-year smoking history. She has never used smokeless tobacco. She reports that she does not drink alcohol or use drugs.  She has a current medication list which includes the following prescription(s): levothyroxine, oxycodone, alum & mag hydroxide-simeth, amlodipine, aspirin ec, calcium carbonate, hydrochlorothiazide, labetalol, nitrofurantoin (macrocrystal-monohydrate), and silver sulfadiazine. Also, is allergic to biaxin [clarithromycin]; toradol [ketorolac tromethamine]; prednisone; morphine and related; and tape.  ROS  Physical Exam:  BP 140/80   Pulse 66   Ht 5\' 2"  (1.575 m)   Wt 183 lb (83 kg)   LMP  (LMP Unknown)   BMI 33.47 kg/m  Body mass index is 33.47 kg/m. Constitutional: Well nourished, well developed female in no acute distress Heart: RRR without murmur Lungs: CTAB, normal respiratory effort.  Abdomen: several blistered areas on abdomen where the Tegaderm for the ON Q catheter was. Honey comb dressing removed from incision and incision is C&D&I\ Extremities: +1 edema in lower extremities Neuro: Grossly intact Psych:  Normal mood, anxious    Assessment/ Plan: CHTN with superimposed preeclampsia without severe features  Has mild range blood pressures on metaprolol  Will add HCTZ x seven days. To eat a  potassium rich food daily x 7 days  Encouraged resting more POD #4 s/p repeat LTCS-healing well Tape burns-Silvadiene ordered Follow up for blood pressure check 11/9 as scheduled.  Dalia Heading, [facility], [facility]

## 2018-12-04 LAB — URINE CULTURE

## 2018-12-07 ENCOUNTER — Telehealth: Payer: Self-pay | Admitting: Obstetrics and Gynecology

## 2018-12-07 ENCOUNTER — Ambulatory Visit (INDEPENDENT_AMBULATORY_CARE_PROVIDER_SITE_OTHER): Payer: Medicaid Other | Admitting: Obstetrics and Gynecology

## 2018-12-07 ENCOUNTER — Encounter: Payer: Self-pay | Admitting: Obstetrics and Gynecology

## 2018-12-07 ENCOUNTER — Other Ambulatory Visit: Payer: Self-pay

## 2018-12-07 VITALS — BP 114/70 | Ht 62.0 in | Wt 181.0 lb

## 2018-12-07 DIAGNOSIS — R102 Pelvic and perineal pain: Secondary | ICD-10-CM | POA: Diagnosis not present

## 2018-12-07 DIAGNOSIS — R300 Dysuria: Secondary | ICD-10-CM | POA: Diagnosis not present

## 2018-12-07 DIAGNOSIS — R319 Hematuria, unspecified: Secondary | ICD-10-CM | POA: Diagnosis not present

## 2018-12-07 LAB — POCT URINALYSIS DIPSTICK
Bilirubin, UA: NEGATIVE
Glucose, UA: NEGATIVE
Leukocytes, UA: NEGATIVE
Nitrite, UA: NEGATIVE
Protein, UA: POSITIVE — AB
Spec Grav, UA: >=1.03 — AB (ref 1.010–1.025)
Urobilinogen, UA: 0.2 E.U./dL
pH, UA: 5 (ref 5.0–8.0)

## 2018-12-07 NOTE — Progress Notes (Signed)
Patient ID: Anita Massey, female   DOB: 1980-04-24, 38 y.o.   MRN: [id number]  Reason for Consult: Postpartum Care   Referred by Venia Carbon, MD  Subjective:     HPI:  Anita Massey is a 38 y.o. female She presents today because last week she had severe pain on 12/01/2018. She went to the ER but wasn't able to be seen after waiting for over 2 hours. She had a UTI which she was treated appropriately for last week with 5 days of antibiotics. She has not had fever or flank pain. She has had pain on her right lower pelvis which felt like ovarian cyst pain. She describes it as a sharp stabbing sensation. She has seen blood in her urine and had pain with urination. She reports dark flecks of old blood in her urine.  She denies a history of kidney stones.   Past Medical History:  Diagnosis Date  . Anemia   . Anxiety state, unspecified   . Blood dyscrasia    PROTHROMBIN G 36644 HETEROZYGOSITY (FACTOR 2)  . Clotting disorder ([place])   . GERD (gastroesophageal reflux disease)    OCC  . 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 Hypertension   . Hypothyroidism   . Multiple joint pain 09/04/2015  . Obesity   . Rosacea   . Undiagnosed cardiac murmurs   . Unspecified essential hypertension    Family History  Problem Relation Age of Onset  . Mitral valve prolapse Mother   . Arrhythmia Mother   . Hypertension Mother   . Diabetes Mother   . Hypertension Father   . Hyperlipidemia Father   . Heart disease Maternal Grandfather   . Lung cancer Maternal Grandfather   . Cancer Maternal Grandfather   . Diabetes Brother   . Heart disease Maternal Aunt   . Diabetes Maternal Aunt   . Depression Maternal Aunt   . Cancer Maternal Grandmother        ovarian melanoma   Past Surgical History:  Procedure Laterality Date  . APPENDECTOMY  2009   [facility]  . CESAREAN SECTION  2016  . CESAREAN SECTION N/A 11/15/2018   Procedure: REPEAT CESAREAN SECTION;  Surgeon: Homero Fellers, MD;   Location: [facility] ORS;  Service: Obstetrics;  Laterality: N/A;  Female born @ 61 Apgars: 8/9 Weight: 6lbs 10 ozs  . CHOLECYSTECTOMY N/A 03/20/2016   Procedure: LAPAROSCOPIC CHOLECYSTECTOMY;  Surgeon: Jules Husbands, MD;  Location: [facility] ORS;  Service: General;  Laterality: N/A;  . DILATION AND CURETTAGE OF UTERUS      Short Social History:  Social History   Tobacco Use  . Smoking status: Former Smoker    Packs/day: 0.25    Years: 1.00    Pack years: 0.25    Types: Cigarettes    Quit date: 03/13/1998    Years since quitting: 20.7  . Smokeless tobacco: Never Used  Substance Use Topics  . Alcohol use: No    Allergies  Allergen Reactions  . Biaxin [Clarithromycin] Hives  . Toradol [Ketorolac Tromethamine]     Chest pain   . Prednisone Anxiety and Palpitations    "Makes me feel crazy"  . Morphine And Related     Chest Pain   . Tape Rash    Paper tape is fine    Current Outpatient Medications  Medication Sig Dispense Refill  . amLODipine (NORVASC) 10 MG tablet Take 1 tablet (10 mg total) by mouth daily. 30 tablet 1  .  aspirin EC 81 MG tablet Take 81 mg by mouth daily.    Marland Kitchen labetalol (NORMODYNE) 100 MG tablet Take 100 mg by mouth 2 (two) times daily.    Marland Kitchen levothyroxine (SYNTHROID) 75 MCG tablet Take 1 tablet (75 mcg total) by mouth daily before breakfast. 30 tablet 6  . alum & mag hydroxide-simeth (MAALOX/MYLANTA) 200-200-20 MG/5ML suspension Take 20 mLs by mouth daily as needed for indigestion or heartburn.     . calcium carbonate (TUMS - DOSED IN MG ELEMENTAL CALCIUM) 500 MG chewable tablet Chew 2 tablets by mouth daily as needed for indigestion or heartburn.    . hydrochlorothiazide (MICROZIDE) 12.5 MG capsule Take 1 capsule (12.5 mg total) by mouth daily. (Patient not taking: Reported on 12/07/2018) 7 capsule 1  . oxyCODONE (ROXICODONE) 5 MG immediate release tablet Take 1 tablet (5 mg total) by mouth every 4 (four) hours as needed for severe pain. (Patient not taking: Reported on  11/23/2018) 20 tablet 0  . silver sulfADIAZINE (SILVADENE) 1 % cream Apply topically to abdominal area BID (Patient not taking: Reported on 12/07/2018) 50 g 0   No current facility-administered medications for this visit.     Review of Systems  Constitutional: Negative for chills, fatigue, fever and unexpected weight change.  HENT: Negative for trouble swallowing.  Eyes: Negative for loss of vision.  Respiratory: Negative for cough, shortness of breath and wheezing.  Cardiovascular: Negative for chest pain, leg swelling, palpitations and syncope.  GI: Negative for abdominal pain, blood in stool, diarrhea, nausea and vomiting.  GU: Positive for dysuria and hematuria. Negative for difficulty urinating and frequency.  Musculoskeletal: Negative for back pain, leg pain and joint pain.  Skin: Negative for rash.  Neurological: Negative for dizziness, headaches, light-headedness, numbness and seizures.  Psychiatric: Negative for behavioral problem, confusion, depressed mood and sleep disturbance.        Objective:  Objective   Vitals:   12/07/18 1314  BP: 114/70  Weight: 181 lb (82.1 kg)  Height: 5\' 2"  (1.575 m)   Body mass index is 33.11 kg/m.  Physical Exam Vitals signs and nursing note reviewed.  Constitutional:      Appearance: She is well-developed.  HENT:     Head: Normocephalic and atraumatic.  Eyes:     Pupils: Pupils are equal, round, and reactive to light.  Cardiovascular:     Rate and Rhythm: Normal rate and regular rhythm.  Pulmonary:     Effort: Pulmonary effort is normal. No respiratory distress.  Genitourinary:    Comments: External: Normal appearing vulva. No lesions noted.  Speculum examination: Normal appearing cervix. No blood in the vaginal vault. no discharge.  Bimanual examination: Uterus midline, non-tender, normal in size, shape and contour.  No CMT. No adnexal masses. No adnexal tenderness. Pelvis not fixed.    Skin:    General: Skin is warm and  dry.  Neurological:     Mental Status: She is alert and oriented to person, place, and time.  Psychiatric:        Behavior: Behavior normal.        Thought Content: Thought content normal.        Judgment: Judgment normal.     Assessment/Plan:     37 yo with hematuria and dysuria. Will resend urine culture to confirm adequate treatment of UTI.  Suspect kidney stone- will obtain CT. May need urology referral.  Encouraged hydration.    Adrian Prows MD [facility], [facility] 12/07/2018 1:37 PM

## 2018-12-07 NOTE — Progress Notes (Signed)
Called and discussed with patient on the phone

## 2018-12-07 NOTE — Telephone Encounter (Signed)
Patient is schedule at 1:15 for today

## 2018-12-07 NOTE — Telephone Encounter (Signed)
-----   Message from Homero Fellers, MD sent at 12/07/2018  8:10 AM EST ----- Will you have this patient added on for an appointment? Thank you,  Dr. Gilman Schmidt

## 2018-12-08 DIAGNOSIS — R300 Dysuria: Secondary | ICD-10-CM | POA: Diagnosis not present

## 2018-12-10 LAB — URINE CULTURE

## 2018-12-16 ENCOUNTER — Telehealth: Payer: Self-pay

## 2018-12-16 NOTE — Telephone Encounter (Signed)
Patient inquiring if Urine Culture results are back. Also wanted to notify her CT is scheduled for 12/25/2018.

## 2018-12-16 NOTE — Telephone Encounter (Signed)
Chart reviewed to find UC results have returned and have been released to my chart. Have not been signed off on yet. Permission from [facility] to notify patient UTI has cleared up. Pt notified. She reports still having burning. Advised will send message to CS who will return to office tomorrow.

## 2018-12-16 NOTE — Telephone Encounter (Signed)
Pt aware. She reports having leg pain like she has with her periods. Inquired if she is taking anything for this pain. She states she has tried ibuprofen, but it hasn't really helped. Advised to try Aleve (Naproxen Sodium) to see if it that may work better.

## 2018-12-16 NOTE — Telephone Encounter (Signed)
She can take OTC Azo for pain, would recommend hydration with fluid and avoiding bladder irritants like teac coffee and soda or other carbonated beverages.

## 2018-12-25 ENCOUNTER — Ambulatory Visit
Admission: RE | Admit: 2018-12-25 | Discharge: 2018-12-25 | Disposition: A | Discharge status: Home / Self Care | Payer: Medicaid Other | Source: Ambulatory Visit | Attending: Obstetrics and Gynecology | Admitting: Obstetrics and Gynecology

## 2018-12-25 ENCOUNTER — Other Ambulatory Visit: Payer: Self-pay | Admitting: Obstetrics & Gynecology

## 2018-12-25 ENCOUNTER — Telehealth: Payer: Self-pay | Admitting: Obstetrics and Gynecology

## 2018-12-25 ENCOUNTER — Other Ambulatory Visit: Payer: Self-pay

## 2018-12-25 DIAGNOSIS — R300 Dysuria: Secondary | ICD-10-CM | POA: Insufficient documentation

## 2018-12-25 DIAGNOSIS — R3129 Other microscopic hematuria: Secondary | ICD-10-CM | POA: Diagnosis not present

## 2018-12-25 DIAGNOSIS — R102 Pelvic and perineal pain: Secondary | ICD-10-CM | POA: Insufficient documentation

## 2018-12-25 DIAGNOSIS — R319 Hematuria, unspecified: Secondary | ICD-10-CM | POA: Insufficient documentation

## 2018-12-25 DIAGNOSIS — D3502 Benign neoplasm of left adrenal gland: Secondary | ICD-10-CM | POA: Diagnosis not present

## 2018-12-25 NOTE — Telephone Encounter (Signed)
Patient is calling to let Dr. Gilman Schmidt know she has finish her CT scan and would like a call with those results. Please advise

## 2018-12-29 NOTE — Telephone Encounter (Signed)
Patient is calling to follow up on her CT scan results. Patient aware CRS is on call today and message has been sent

## 2018-12-30 NOTE — Telephone Encounter (Signed)
Returned call- went to voicemail but not able to leave message- will send note to mychart, if she calls back there were no concerning findings and she can review the result in [place]

## 2018-12-30 NOTE — Telephone Encounter (Signed)
Patient is calling back had questions about finding. Please advise

## 2018-12-31 ENCOUNTER — Ambulatory Visit: Payer: Medicaid Other | Admitting: Obstetrics and Gynecology

## 2019-01-26 ENCOUNTER — Encounter: Payer: BLUE CROSS/BLUE SHIELD | Admitting: Internal Medicine

## 2019-01-28 ENCOUNTER — Other Ambulatory Visit: Payer: Self-pay

## 2019-01-28 ENCOUNTER — Encounter: Payer: Self-pay | Admitting: Obstetrics and Gynecology

## 2019-01-28 ENCOUNTER — Ambulatory Visit (INDEPENDENT_AMBULATORY_CARE_PROVIDER_SITE_OTHER): Payer: Medicaid Other | Admitting: Obstetrics and Gynecology

## 2019-01-28 VITALS — BP 120/80 | Ht 62.0 in | Wt 183.0 lb

## 2019-01-28 DIAGNOSIS — Z30430 Encounter for insertion of intrauterine contraceptive device: Secondary | ICD-10-CM | POA: Diagnosis not present

## 2019-01-28 NOTE — Patient Instructions (Signed)

## 2019-01-28 NOTE — Progress Notes (Signed)
  IUD PROCEDURE NOTE:  Anita Massey is a 39 y.o. [phone] here for IUD insertion. No GYN concerns.  Last pap smear was normal.  IUD Insertion Procedure Note Patient identified, informed consent performed, consent signed.   Discussed risks of irregular bleeding, cramping, infection, malpositioning or misplacement of the IUD outside the uterus which may require further procedure such as laparoscopy, risk of failure <1%. Time out was performed.  Urine pregnancy test negative.  A bimanual exam showed the uterus to be anteverted.  Speculum placed in the vagina.  Cervix visualized.  Cleaned with Betadine x 2.  Grasped anteriorly with a single tooth tenaculum.  Uterus sounded to 9 cm.   IUD placed per manufacturer's recommendations.  Strings trimmed to 3 cm. Tenaculum was removed, good hemostasis noted.  Patient tolerated procedure well.   Patient was given post-procedure instructions.  She was advised to have backup contraception for one week.  Patient was also asked to check IUD strings periodically and follow up in 4 weeks for IUD check.  Mirena IUD inserted  Adrian Prows MD [place], [facility] 01/28/2019 12:23 PM

## 2019-02-08 DIAGNOSIS — B349 Viral infection, unspecified: Secondary | ICD-10-CM | POA: Diagnosis not present

## 2019-02-08 DIAGNOSIS — Z0184 Encounter for antibody response examination: Secondary | ICD-10-CM | POA: Diagnosis not present

## 2019-02-08 DIAGNOSIS — L719 Rosacea, unspecified: Secondary | ICD-10-CM | POA: Diagnosis not present

## 2019-02-26 ENCOUNTER — Encounter: Payer: Self-pay | Admitting: Obstetrics and Gynecology

## 2019-02-26 ENCOUNTER — Ambulatory Visit (INDEPENDENT_AMBULATORY_CARE_PROVIDER_SITE_OTHER): Payer: Medicaid Other | Admitting: Obstetrics and Gynecology

## 2019-02-26 ENCOUNTER — Other Ambulatory Visit: Payer: Self-pay

## 2019-02-26 VITALS — BP 110/70 | Ht 62.0 in | Wt 187.0 lb

## 2019-02-26 DIAGNOSIS — Z30431 Encounter for routine checking of intrauterine contraceptive device: Secondary | ICD-10-CM | POA: Diagnosis not present

## 2019-02-26 DIAGNOSIS — E039 Hypothyroidism, unspecified: Secondary | ICD-10-CM | POA: Diagnosis not present

## 2019-02-26 NOTE — Progress Notes (Signed)
  History of Present Illness:  Anita Massey is a 39 y.o. that had a Mirena IUD placed approximately 1 month ago. 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 Also,  has a past surgical history that includes Dilation and curettage of uterus; Appendectomy (2009); Cesarean section (2016); Cholecystectomy (N/A, 03/20/2016); and Cesarean section (N/A, 11/15/2018)., family history includes Arrhythmia in her mother; Cancer in her maternal grandfather and maternal grandmother; Depression in her maternal aunt; Diabetes in her brother, maternal aunt, and mother; Heart disease in her maternal aunt and maternal grandfather; Hyperlipidemia in her father; Hypertension in her father and mother; Lung cancer in her maternal grandfather; Mitral valve prolapse in her mother.,  reports that she quit smoking about 20 years ago. Her smoking use included cigarettes. She has a 0.25 pack-year smoking history. She has never used smokeless tobacco. She reports that she does not drink alcohol or use drugs.   Current Meds  Medication Sig  . alum & mag hydroxide-simeth (MAALOX/MYLANTA) 200-200-20 MG/5ML suspension Take 20 mLs by mouth daily as needed for indigestion or heartburn.   Marland Kitchen amLODipine (NORVASC) 10 MG tablet Take 1 tablet (10 mg total) by mouth daily.  Marland Kitchen ascorbic acid (VITAMIN C) 1000 MG tablet Take by mouth.  Marland Kitchen aspirin EC 81 MG tablet Take 81 mg by mouth daily.  . calcium carbonate (TUMS - DOSED IN MG ELEMENTAL CALCIUM) 500 MG chewable tablet Chew 2 tablets by mouth daily as needed for indigestion or heartburn.  . hydrochlorothiazide (MICROZIDE) 12.5 MG  capsule Take 1 capsule (12.5 mg total) by mouth daily.  Marland Kitchen labetalol (NORMODYNE) 100 MG tablet Take 100 mg by mouth 2 (two) times daily.  Marland Kitchen levothyroxine (SYNTHROID) 75 MCG tablet Take 1 tablet (75 mcg total) by mouth daily before breakfast.  . silver sulfADIAZINE (SILVADENE) 1 % cream Apply topically to abdominal area BID  .  Also, is allergic to biaxin [clarithromycin]; toradol [ketorolac tromethamine]; prednisone; morphine and related; and tape..  ROS  Physical Exam:  BP 110/70   Ht 5\' 2"  (1.575 m)   Wt 187 lb (84.8 kg)   Breastfeeding No   BMI 34.20 kg/m  Body mass index is 34.2 kg/m. Constitutional: Well nourished, well developed female in no acute distress.  Abdomen: diffusely non tender to palpation, non distended, and no masses, hernias Neuro: Grossly intact Psych:  Normal mood and affect.    Pelvic exam:  Two IUD strings present seen coming from the cervical os. EGBUS, vaginal vault and cervix: within normal limits  Assessment: IUD strings present in proper location; pt doing well  Plan: She was told to continue to use barrier contraception, in order to prevent any STIs, and to take a home pregnancy test or call us if she ever thinks she may be pregnant, and that her IUD expires in 6 years.  She was amenable to this plan and we will see her back in 1 year/PRN.  A total of 15 minutes were spent face-to-face with the patient as well as preparation, review, communication, and documentation during this encounter.   Adrian Prows MD [facility], [facility] 02/26/2019 11:27 AM

## 2019-02-27 LAB — TSH: TSH: 0.531 u[IU]/mL (ref 0.450–4.500)

## 2019-03-26 ENCOUNTER — Ambulatory Visit (INDEPENDENT_AMBULATORY_CARE_PROVIDER_SITE_OTHER): Payer: Medicaid Other | Admitting: Internal Medicine

## 2019-03-26 ENCOUNTER — Encounter: Payer: Self-pay | Admitting: Internal Medicine

## 2019-03-26 ENCOUNTER — Other Ambulatory Visit: Payer: Self-pay

## 2019-03-26 VITALS — BP 138/86 | HR 95 | Temp 97.3°F | Ht 62.5 in | Wt 190.0 lb

## 2019-03-26 DIAGNOSIS — L719 Rosacea, unspecified: Secondary | ICD-10-CM | POA: Diagnosis not present

## 2019-03-26 DIAGNOSIS — O99280 Endocrine, nutritional and metabolic diseases complicating pregnancy, unspecified trimester: Secondary | ICD-10-CM

## 2019-03-26 DIAGNOSIS — R0989 Other specified symptoms and signs involving the circulatory and respiratory systems: Secondary | ICD-10-CM | POA: Diagnosis not present

## 2019-03-26 DIAGNOSIS — Z0000 Encounter for general adult medical examination without abnormal findings: Secondary | ICD-10-CM

## 2019-03-26 DIAGNOSIS — E039 Hypothyroidism, unspecified: Secondary | ICD-10-CM | POA: Diagnosis not present

## 2019-03-26 MED ORDER — KETOCONAZOLE 2 % EX CREA: 1.0000 "application " | TOPICAL_CREAM | Freq: Two times a day (BID) | CUTANEOUS | 3 refills | Status: DC | PRN

## 2019-03-26 NOTE — Assessment & Plan Note (Signed)
Severe and inflamed looking now Will try ketoconazole cream Has had past doxy which helped---but diarrhea Consider HC 2.5% if the keto cream doesn't help (has look of seb derm and inflamed)

## 2019-03-26 NOTE — Assessment & Plan Note (Signed)
Will continue the supplement for now

## 2019-03-26 NOTE — Progress Notes (Signed)
Subjective:    Patient ID: Anita Massey, female    DOB: Oct 30, 1980, 39 y.o.   MRN: [id number]  HPI Here for physical This visit occurred during the SARS-CoV-2 public health emergency.  Safety protocols were in place, including screening questions prior to the visit, additional usage of staff PPE, and extensive cleaning of exam room while observing appropriate contact time as indicated for disinfecting solutions.   Did conceive and now has 3rd child She is done now Has mirena in  BP meds were changed Put on labetalol----tried changing back to metoprolol but highly labile BP with ER visits, etc. Back on labetalol Amlodipine added --variable response Now on both--will be high at times  Lots of stress Her mom had surgery--needing to help her and GM Now with 3 kids--- 79 and 108 year olds (and 59 month old)  Facial rash is now worse Unclear if this is stress related Painful, cracking, etc Called her dermatologist--but needs referral Dx--rosacea in past but also has flaking in scalp, etc Tried husband's ketoconazole cream last night--seemed to help some  Current Outpatient Medications on File Prior to Visit  Medication Sig Dispense Refill  . alum & mag hydroxide-simeth (MAALOX/MYLANTA) 200-200-20 MG/5ML suspension Take 20 mLs by mouth daily as needed for indigestion or heartburn.     Marland Kitchen amLODipine (NORVASC) 10 MG tablet Take 1 tablet (10 mg total) by mouth daily. 30 tablet 1  . ascorbic acid (VITAMIN C) 1000 MG tablet Take by mouth.    Marland Kitchen aspirin EC 81 MG tablet Take 81 mg by mouth daily.    . calcium carbonate (TUMS - DOSED IN MG ELEMENTAL CALCIUM) 500 MG chewable tablet Chew 2 tablets by mouth daily as needed for indigestion or heartburn.    . labetalol (NORMODYNE) 100 MG tablet Take 100 mg by mouth 2 (two) times daily.    Marland Kitchen levothyroxine (SYNTHROID) 75 MCG tablet Take 1 tablet (75 mcg total) by mouth daily before breakfast. 30 tablet 6   No current facility-administered medications  on file prior to visit.    Allergies  Allergen Reactions  . Biaxin [Clarithromycin] Hives  . Toradol [Ketorolac Tromethamine]     Chest pain   . Prednisone Anxiety and Palpitations    "Makes me feel crazy"  . Morphine And Related     Chest Pain   . Tape Rash    Paper tape is fine    Past Medical History:  Diagnosis Date  . Anemia   . Anxiety state, unspecified   . 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 Clotting disorder ([place])   . GERD (gastroesophageal reflux disease)    OCC  . 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 Hypertension   . Hypothyroidism   . Multiple joint pain 09/04/2015  . Obesity   . Rosacea   . Undiagnosed cardiac murmurs   . Unspecified essential hypertension     Past Surgical History:  Procedure Laterality Date  . APPENDECTOMY  2009   [facility]  . CESAREAN SECTION  2016  . CESAREAN SECTION N/A 11/15/2018   Procedure: REPEAT CESAREAN SECTION;  Surgeon: Homero Fellers, MD;  Location: [facility] ORS;  Service: Obstetrics;  Laterality: N/A;  Female born @ 95 Apgars: 8/9 Weight: 6lbs 10 ozs  . CHOLECYSTECTOMY N/A 03/20/2016   Procedure: LAPAROSCOPIC CHOLECYSTECTOMY;  Surgeon: Jules Husbands, MD;  Location: [facility] ORS;  Service: General;  Laterality: N/A;  . DILATION AND CURETTAGE OF UTERUS  Family History  Problem Relation Age of Onset  . Mitral valve prolapse Mother   . Arrhythmia Mother   . Hypertension Mother   . Diabetes Mother   . Hypertension Father   . Hyperlipidemia Father   . Heart disease Maternal Grandfather   . Lung cancer Maternal Grandfather   . Cancer Maternal Grandfather   . Diabetes Brother   . Heart disease Maternal Aunt   . Diabetes Maternal Aunt   . Depression Maternal Aunt   . Cancer Maternal Grandmother        ovarian melanoma    Social History   Socioeconomic History  . Marital status: Married    Spouse name: Harrell Gave  . Number of children: 3  . Years of education: Not on file    . Highest education level: Not on file  Occupational History  . Occupation: Homemaker  Tobacco Use  . Smoking status: Former Smoker    Packs/day: 0.25    Years: 1.00    Pack years: 0.25    Types: Cigarettes    Quit date: 03/13/1998    Years since quitting: 21.0  . Smokeless tobacco: Never Used  Substance and Sexual Activity  . Alcohol use: No  . Drug use: No  . Sexual activity: Not Currently    Birth control/protection: I.U.D.    Comment: Mirena   Other Topics Concern  . Not on file  Social History Narrative          Social Determinants of Health   Financial Resource Strain: Low Risk   . Difficulty of Paying Living Expenses: Not hard at all  Food Insecurity: No Food Insecurity  . Worried About Charity fundraiser in the Last Year: Never true  . Ran Out of Food in the Last Year: Never true  Transportation Needs: No Transportation Needs  . Lack of Transportation (Medical): No  . Lack of Transportation (Non-Medical): No  Physical Activity: Inactive  . Days of Exercise per Week: 0 days  . Minutes of Exercise per Session: 0 min  Stress: Stress Concern Present  . Feeling of Stress : Very much  Social Connections: Unknown  . Frequency of Communication with Friends and Family: More than three times a week  . Frequency of Social Gatherings with Friends and Family: More than three times a week  . Attends Religious Services: Patient refused  . Active Member of Clubs or Organizations: Patient refused  . Attends Archivist Meetings: Patient refused  . Marital Status: Married  Human resources officer Violence: Not At Risk  . Fear of Current or Ex-Partner: No  . Emotionally Abused: No  . Physically Abused: No  . Sexually Abused: No   Review of Systems  Constitutional: Negative for unexpected weight change.       Tired with baby Wears seat belt  HENT: Negative for hearing loss, tinnitus and trouble swallowing.        Overdue with dentist  Eyes: Negative for visual  disturbance.       No diplopia or unilateral vision loss  Cardiovascular: Positive for palpitations. Negative for leg swelling.       Occ sharp shooting chest pain--at rest (substernal)  Gastrointestinal:       Heartburn during pregnancy--better now Chronic constipation---alternates with diarrhea Discussed fiber  Endocrine: Negative for polydipsia and polyuria.  Genitourinary: Negative for dyspareunia, dysuria and hematuria.       Hasn't resumed intercourse yet Periods are back--now lightening since mirena in  Musculoskeletal: Negative for arthralgias and  joint swelling.       Gets some burning back pain at times--neck and all down the back  Ibuprofen and tylenol--some help  Skin: Positive for rash.  Allergic/Immunologic: Positive for environmental allergies. Negative for immunocompromised state.       No meds now---allegra in the past  Neurological: Negative for dizziness, syncope, weakness and light-headedness.       Occ headaches  Hematological: Negative for adenopathy. Does not bruise/bleed easily.  Psychiatric/Behavioral: Negative for dysphoric mood and sleep disturbance.       No major anxiety lately--just lots of stress       Objective:   Physical Exam  Constitutional: She appears well-developed. No distress.  HENT:  Head: Normocephalic and atraumatic.  Right Ear: External ear normal.  Left Ear: External ear normal.  Mouth/Throat: Oropharynx is clear and moist. No oropharyngeal exudate.  Eyes: Pupils are equal, round, and reactive to light. Conjunctivae are normal.  Neck: No thyromegaly present.  Cardiovascular: Normal rate, regular rhythm, normal heart sounds and intact distal pulses. Exam reveals no gallop.  No murmur heard. Respiratory: Effort normal and breath sounds normal. No respiratory distress. She has no wheezes. She has no rales.  GI: Soft. There is no abdominal tenderness.  Musculoskeletal:        General: No tenderness or edema.  Lymphadenopathy:    She  has no cervical adenopathy.  Skin:  Scaling inflamed rash along nasal bridge and along nasal sides Some above right eye also  Psychiatric: She has a normal mood and affect. Her behavior is normal.           Assessment & Plan:

## 2019-03-26 NOTE — Assessment & Plan Note (Signed)
Healthy Recovering from pregnancy and having a baby Needs respite, etc---discussed

## 2019-03-26 NOTE — Assessment & Plan Note (Signed)
BP Readings from Last 3 Encounters:  03/26/19 138/86  02/26/19 110/70  01/28/19 120/80   Reasonable control now on dual therapy

## 2019-03-30 ENCOUNTER — Telehealth: Payer: Self-pay

## 2019-03-30 NOTE — Telephone Encounter (Signed)
Opened in error

## 2019-03-30 NOTE — Telephone Encounter (Signed)
Okay I thought that would be the case

## 2019-03-30 NOTE — Telephone Encounter (Signed)
Spoke w/pt.  We are not able to refer pt out w/Medicaid [place] Access.  Pt understands she will need to contact Medicaid to see which PCP she can see and have that office refer her.

## 2019-04-10 ENCOUNTER — Other Ambulatory Visit: Payer: Self-pay | Admitting: Obstetrics and Gynecology

## 2019-04-12 NOTE — Telephone Encounter (Signed)
Advise

## 2019-04-27 ENCOUNTER — Telehealth: Payer: Self-pay | Admitting: Internal Medicine

## 2019-04-27 NOTE — Telephone Encounter (Signed)
Pt is needing another cream called in...what she currently has does not work -- Ketoconazole 2% cream  Pt is requesting this be called in to [facility] Note by Venia Carbon, MD at 03/26/2019 12:15 PM  Author: Venia Carbon, MD Author Type: Physician Filed: 03/26/2019 12:16 PM  Note Status: Written Cosign: Cosign Not Required Encounter Date: 03/26/2019  Problem: Rosacea  Editor: Venia Carbon, MD (Physician)    Severe and inflamed looking now Will try ketoconazole cream Has had past doxy which helped---but diarrhea Consider HC 2.5% if the keto cream doesn't help (has look of seb derm and inflamed)

## 2019-04-28 MED ORDER — HYDROCORTISONE 2.5 % EX CREA: TOPICAL_CREAM | Freq: Three times a day (TID) | CUTANEOUS | 3 refills | Status: DC | PRN

## 2019-04-28 NOTE — Telephone Encounter (Signed)
Let her know that I sent in the other cream. If that doesn't help, she should probably see a dermatologist

## 2019-04-28 NOTE — Telephone Encounter (Signed)
Spoke to pt. She will get in contact with her Medicaid case worker and make sure Dr Silvio Pate was on the card so a referral can be done if needed.

## 2019-04-29 NOTE — Telephone Encounter (Signed)
[name] has [place] Access---I cannot be on the card, since we don't participate with that. Regular Medicaid is okay

## 2019-04-29 NOTE — Telephone Encounter (Signed)
Patient called back  Advised of message below from Dr Silvio Pate. She called her medicaid case worker and they can not find Dr Silvio Pate. Advised it was because of [place] and we do not participate in that. Patient doesn't understand how she is suppose to get a referral. In order for her to see the dermatologist she has to have the referral..   Can you call the patient an explain the referral process and why we cant refer patients with medicaid. I tried to explain but she did not understand

## 2019-05-06 NOTE — Telephone Encounter (Signed)
I called and discussed this with patient. I explained the issue with [place] not being credentialed and how we are unable to refer her anywhere. But if she called medicaid, they should be able to tell her where she can go. She said she still didn't understand. She stated she had already called her social worker and was told it was her responsibility to find someone who is credentialed with [place] access to get a referral. The social worker was unable to give her a list of providers taking [employer] access. I apologized and advised her to call the number on her medicaid card to see if they were able to connect her with someone who can give her this information.

## 2019-05-26 DIAGNOSIS — E663 Overweight: Secondary | ICD-10-CM | POA: Diagnosis not present

## 2019-05-26 DIAGNOSIS — E039 Hypothyroidism, unspecified: Secondary | ICD-10-CM | POA: Diagnosis not present

## 2019-05-26 DIAGNOSIS — Z1331 Encounter for screening for depression: Secondary | ICD-10-CM | POA: Diagnosis not present

## 2019-05-26 DIAGNOSIS — E119 Type 2 diabetes mellitus without complications: Secondary | ICD-10-CM | POA: Diagnosis not present

## 2019-05-26 DIAGNOSIS — R5383 Other fatigue: Secondary | ICD-10-CM | POA: Diagnosis not present

## 2019-05-26 DIAGNOSIS — R7303 Prediabetes: Secondary | ICD-10-CM | POA: Diagnosis not present

## 2019-05-26 DIAGNOSIS — I10 Essential (primary) hypertension: Secondary | ICD-10-CM | POA: Diagnosis not present

## 2019-05-26 DIAGNOSIS — E7800 Pure hypercholesterolemia, unspecified: Secondary | ICD-10-CM | POA: Diagnosis not present

## 2019-06-11 ENCOUNTER — Other Ambulatory Visit: Payer: Self-pay

## 2019-06-11 ENCOUNTER — Ambulatory Visit (INDEPENDENT_AMBULATORY_CARE_PROVIDER_SITE_OTHER): Payer: Medicaid Other | Admitting: Internal Medicine

## 2019-06-11 ENCOUNTER — Encounter: Payer: Self-pay | Admitting: Internal Medicine

## 2019-06-11 DIAGNOSIS — R7303 Prediabetes: Secondary | ICD-10-CM | POA: Diagnosis not present

## 2019-06-11 MED ORDER — DOXYCYCLINE HYCLATE 100 MG PO TABS: 100.0000 mg | ORAL_TABLET | Freq: Two times a day (BID) | ORAL | 1 refills | Status: DC

## 2019-06-11 NOTE — Assessment & Plan Note (Addendum)
A1c 6.1% Does have Anita Massey She has a lot of dietary problems--discussed Must stop sugared drinks Not really exercising--discussed Metformin not a bad idea but may not be necessary Will recheck in 6 months and decide about Rx

## 2019-06-11 NOTE — Progress Notes (Signed)
Subjective:    Patient ID: Anita Massey, female    DOB: October 10, 1980, 39 y.o.   MRN: [id number]  HPI Here due to concerns about diabetes This visit occurred during the SARS-CoV-2 public health emergency.  Safety protocols were in place, including screening questions prior to the visit, additional usage of staff PPE, and extensive cleaning of exam room while observing appropriate contact time as indicated for disinfecting solutions.   Found out the [facility] was her [place] [employer] to change to [employer] (on the exchange) Got blood work there----A1c was 6.1%  Still hasn't seen a dermatologist Has stronger ketoconazole cream Doxycyline has really helped  Got Rx for metformin (given even before labs back) Has it but hasn't filled it  Current Outpatient Medications on File Prior to Visit  Medication Sig Dispense Refill  . alum & mag hydroxide-simeth (MAALOX/MYLANTA) 200-200-20 MG/5ML suspension Take 20 mLs by mouth daily as needed for indigestion or heartburn.     Marland Kitchen amLODipine (NORVASC) 10 MG tablet Take 1 tablet (10 mg total) by mouth daily. 30 tablet 1  . ascorbic acid (VITAMIN C) 1000 MG tablet Take by mouth.    Marland Kitchen aspirin EC 81 MG tablet Take 81 mg by mouth daily.    . calcium carbonate (TUMS - DOSED IN MG ELEMENTAL CALCIUM) 500 MG chewable tablet Chew 2 tablets by mouth daily as needed for indigestion or heartburn.    . doxycycline (VIBRA-TABS) 100 MG tablet Take 100 mg by mouth 2 (two) times daily.    Arna Medici 75 MCG tablet TAKE 1 TABLET BY MOUTH ONCE DAILY BEFORE BREAKFAST 90 tablet 0  . hydrocortisone 2.5 % cream Apply topically 3 (three) times daily as needed. 28 g 3  . KETOCONAZOLE PO Apply topically. States it is 3%    . labetalol (NORMODYNE) 100 MG tablet Take 100 mg by mouth 2 (two) times daily.     No current facility-administered medications on file prior to visit.    Allergies  Allergen Reactions  . Biaxin [Clarithromycin] Hives  .  Toradol [Ketorolac Tromethamine]     Chest pain   . Prednisone Anxiety and Palpitations    "Makes me feel crazy"  . Morphine And Related     Chest Pain   . Tape Rash    Paper tape is fine    Past Medical History:  Diagnosis Date  . Anemia   . Anxiety state, unspecified   . 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 Clotting disorder ([place])   . GERD (gastroesophageal reflux disease)    OCC  . 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 Hypertension   . Hypothyroidism   . Multiple joint pain 09/04/2015  . Obesity   . Rosacea   . Undiagnosed cardiac murmurs   . Unspecified essential hypertension     Past Surgical History:  Procedure Laterality Date  . APPENDECTOMY  2009   [facility]  . CESAREAN SECTION  2016  . CESAREAN SECTION N/A 11/15/2018   Procedure: REPEAT CESAREAN SECTION;  Surgeon: Homero Fellers, MD;  Location: [facility] ORS;  Service: Obstetrics;  Laterality: N/A;  Female born @ 37 Apgars: 8/9 Weight: 6lbs 10 ozs  . CHOLECYSTECTOMY N/A 03/20/2016   Procedure: LAPAROSCOPIC CHOLECYSTECTOMY;  Surgeon: Jules Husbands, MD;  Location: [facility] ORS;  Service: General;  Laterality: N/A;  . DILATION AND CURETTAGE OF UTERUS      Family History  Problem Relation Age of  Onset  . Mitral valve prolapse Mother   . Arrhythmia Mother   . Hypertension Mother   . Diabetes Mother   . Hypertension Father   . Hyperlipidemia Father   . Heart disease Maternal Grandfather   . Lung cancer Maternal Grandfather   . Cancer Maternal Grandfather   . Diabetes Brother   . Heart disease Maternal Aunt   . Diabetes Maternal Aunt   . Depression Maternal Aunt   . Cancer Maternal Grandmother        ovarian melanoma    Social History   Socioeconomic History  . Marital status: Married    Spouse name: Harrell Gave  . Number of children: 3  . Years of education: Not on file  . Highest education level: Not on file  Occupational History  . Occupation: Homemaker  Tobacco  Use  . Smoking status: Former Smoker    Packs/day: 0.25    Years: 1.00    Pack years: 0.25    Types: Cigarettes    Quit date: 03/13/1998    Years since quitting: 21.2  . Smokeless tobacco: Never Used  Substance and Sexual Activity  . Alcohol use: No  . Drug use: No  . Sexual activity: Not Currently    Birth control/protection: I.U.D.    Comment: Mirena   Other Topics Concern  . Not on file  Social History Narrative          Social Determinants of Health   Financial Resource Strain: Low Risk   . Difficulty of Paying Living Expenses: Not hard at all  Food Insecurity: No Food Insecurity  . Worried About Charity fundraiser in the Last Year: Never true  . Ran Out of Food in the Last Year: Never true  Transportation Needs: No Transportation Needs  . Lack of Transportation (Medical): No  . Lack of Transportation (Non-Medical): No  Physical Activity: Inactive  . Days of Exercise per Week: 0 days  . Minutes of Exercise per Session: 0 min  Stress: Stress Concern Present  . Feeling of Stress : Very much  Social Connections: Unknown  . Frequency of Communication with Friends and Family: More than three times a week  . Frequency of Social Gatherings with Friends and Family: More than three times a week  . Attends Religious Services: Patient refused  . Active Member of Clubs or Organizations: Patient refused  . Attends Archivist Meetings: Patient refused  . Marital Status: Married  Human resources officer Violence: Not At Risk  . Fear of Current or Ex-Partner: No  . Emotionally Abused: No  . Physically Abused: No  . Sexually Abused: No   Review of Systems  Weight is back to before the pregnancy Some sugared drinks Regular fast food---[employer], Cook out     Objective:   Physical Exam  Constitutional: She appears well-developed. No distress.  Psychiatric: She has a normal mood and affect. Her behavior is normal.           Assessment & Plan:

## 2019-06-11 NOTE — Patient Instructions (Signed)
Please stop all the sugared drinks and avoid sweets and fast food.   DASH Eating Plan DASH stands for "Dietary Approaches to Stop Hypertension." The DASH eating plan is a healthy eating plan that has been shown to reduce high blood pressure (hypertension). It may also reduce your risk for type 2 diabetes, heart disease, and stroke. The DASH eating plan may also help with weight loss. What are tips for following this plan?  General guidelines  Avoid eating more than 2,300 mg (milligrams) of salt (sodium) a day. If you have hypertension, you may need to reduce your sodium intake to 1,500 mg a day.  Limit alcohol intake to no more than 1 drink a day for nonpregnant women and 2 drinks a day for men. One drink equals 12 oz of beer, 5 oz of wine, or 1 oz of hard liquor.  Work with your health care provider to maintain a healthy body weight or to lose weight. Ask what an ideal weight is for you.  Get at least 30 minutes of exercise that causes your heart to beat faster (aerobic exercise) most days of the week. Activities may include walking, swimming, or biking.  Work with your health care provider or diet and nutrition specialist (dietitian) to adjust your eating plan to your individual calorie needs. Reading food labels   Check food labels for the amount of sodium per serving. Choose foods with less than 5 percent of the Daily Value of sodium. Generally, foods with less than 300 mg of sodium per serving fit into this eating plan.  To find whole grains, look for the word "whole" as the first word in the ingredient list. Shopping  Buy products labeled as "low-sodium" or "no salt added."  Buy fresh foods. Avoid canned foods and premade or frozen meals. Cooking  Avoid adding salt when cooking. Use salt-free seasonings or herbs instead of table salt or sea salt. Check with your health care provider or pharmacist before using salt substitutes.  Do not fry foods. Cook foods using healthy  methods such as baking, boiling, grilling, and broiling instead.  Cook with heart-healthy oils, such as olive, canola, soybean, or sunflower oil. Meal planning  Eat a balanced diet that includes: ? 5 or more servings of fruits and vegetables each day. At each meal, try to fill half of your plate with fruits and vegetables. ? Up to 6-8 servings of whole grains each day. ? Less than 6 oz of lean meat, poultry, or fish each day. A 3-oz serving of meat is about the same size as a deck of cards. One egg equals 1 oz. ? 2 servings of low-fat dairy each day. ? A serving of nuts, seeds, or beans 5 times each week. ? Heart-healthy fats. Healthy fats called Omega-3 fatty acids are found in foods such as flaxseeds and coldwater fish, like sardines, salmon, and mackerel.  Limit how much you eat of the following: ? Canned or prepackaged foods. ? Food that is high in trans fat, such as fried foods. ? Food that is high in saturated fat, such as fatty meat. ? Sweets, desserts, sugary drinks, and other foods with added sugar. ? Full-fat dairy products.  Do not salt foods before eating.  Try to eat at least 2 vegetarian meals each week.  Eat more home-cooked food and less restaurant, buffet, and fast food.  When eating at a restaurant, ask that your food be prepared with less salt or no salt, if possible. What foods are recommended?  The items listed may not be a complete list. Talk with your dietitian about what dietary choices are best for you. Grains Whole-grain or whole-wheat bread. Whole-grain or whole-wheat pasta. Brown rice. [place]. Bulgur. Whole-grain and low-sodium cereals. Pita bread. Low-fat, low-sodium crackers. Whole-wheat flour tortillas. Vegetables Fresh or frozen vegetables (raw, steamed, roasted, or grilled). Low-sodium or reduced-sodium tomato and vegetable juice. Low-sodium or reduced-sodium tomato sauce and tomato paste. Low-sodium or reduced-sodium canned  vegetables. Fruits All fresh, dried, or frozen fruit. Canned fruit in natural juice (without added sugar). Meat and other protein foods Skinless chicken or [place]. Ground chicken or [place]. Pork with fat trimmed off. Fish and seafood. Egg whites. Dried beans, peas, or lentils. Unsalted nuts, nut butters, and seeds. Unsalted canned beans. Lean cuts of beef with fat trimmed off. Low-sodium, lean deli meat. Dairy Low-fat (1%) or fat-free (skim) milk. Fat-free, low-fat, or reduced-fat cheeses. Nonfat, low-sodium ricotta or cottage cheese. Low-fat or nonfat yogurt. Low-fat, low-sodium cheese. Fats and oils Soft margarine without trans fats. Vegetable oil. Low-fat, reduced-fat, or light mayonnaise and salad dressings (reduced-sodium). Canola, safflower, olive, soybean, and sunflower oils. Avocado. Seasoning and other foods Herbs. Spices. Seasoning mixes without salt. Unsalted popcorn and pretzels. Fat-free sweets. What foods are not recommended? The items listed may not be a complete list. Talk with your dietitian about what dietary choices are best for you. Grains Baked goods made with fat, such as croissants, muffins, or some breads. Dry pasta or rice meal packs. Vegetables Creamed or fried vegetables. Vegetables in a cheese sauce. Regular canned vegetables (not low-sodium or reduced-sodium). Regular canned tomato sauce and paste (not low-sodium or reduced-sodium). Regular tomato and vegetable juice (not low-sodium or reduced-sodium). Angie Fava. Olives. Fruits Canned fruit in a light or heavy syrup. Fried fruit. Fruit in cream or butter sauce. Meat and other protein foods Fatty cuts of meat. Ribs. Fried meat. Berniece Salines. Sausage. Bologna and other processed lunch meats. Salami. Fatback. Hotdogs. Bratwurst. Salted nuts and seeds. Canned beans with added salt. Canned or smoked fish. Whole eggs or egg yolks. Chicken or [place] with skin. Dairy Whole or 2% milk, cream, and half-and-half. Whole or full-fat  cream cheese. Whole-fat or sweetened yogurt. Full-fat cheese. Nondairy creamers. Whipped toppings. Processed cheese and cheese spreads. Fats and oils Butter. Stick margarine. Lard. Shortening. Ghee. Bacon fat. Tropical oils, such as coconut, palm kernel, or palm oil. Seasoning and other foods Salted popcorn and pretzels. Onion salt, garlic salt, seasoned salt, table salt, and sea salt. Worcestershire sauce. Tartar sauce. Barbecue sauce. Teriyaki sauce. Soy sauce, including reduced-sodium. Steak sauce. Canned and packaged gravies. Fish sauce. Oyster sauce. Cocktail sauce. Horseradish that you find on the shelf. Ketchup. Mustard. Meat flavorings and tenderizers. Bouillon cubes. Hot sauce and Tabasco sauce. Premade or packaged marinades. Premade or packaged taco seasonings. Relishes. Regular salad dressings. Where to find more information:  National Heart, Lung, and [place]: https://wilson-eaton.com/  American Heart Association: www.heart.org Summary  The DASH eating plan is a healthy eating plan that has been shown to reduce high blood pressure (hypertension). It may also reduce your risk for type 2 diabetes, heart disease, and stroke.  With the DASH eating plan, you should limit salt (sodium) intake to 2,300 mg a day. If you have hypertension, you may need to reduce your sodium intake to 1,500 mg a day.  When on the DASH eating plan, aim to eat more fresh fruits and vegetables, whole grains, lean proteins, low-fat dairy, and heart-healthy fats.  Work with your health care  provider or diet and nutrition specialist (dietitian) to adjust your eating plan to your individual calorie needs. This information is not intended to replace advice given to you by your health care provider. Make sure you discuss any questions you have with your health care provider. Document Revised: 12/13/2016 Document Reviewed: 12/25/2015 Elsevier Patient Education  2020 [employer].

## 2019-08-17 DIAGNOSIS — R519 Headache, unspecified: Secondary | ICD-10-CM | POA: Diagnosis not present

## 2019-08-17 DIAGNOSIS — M791 Myalgia, unspecified site: Secondary | ICD-10-CM | POA: Diagnosis not present

## 2019-08-17 DIAGNOSIS — B349 Viral infection, unspecified: Secondary | ICD-10-CM | POA: Diagnosis not present

## 2019-10-25 ENCOUNTER — Telehealth: Payer: Self-pay | Admitting: Internal Medicine

## 2019-10-25 NOTE — Telephone Encounter (Signed)
She has already established with me---so it would be okay to fill any of her prescriptions for 1 year

## 2019-10-25 NOTE — Telephone Encounter (Signed)
Anita Massey called in and says she went to [facility] to pick up refills of Amlodipine 10mg  and Levothyroxine 17mcg that was filled by Dr. Brunetta Genera (sp?), but the pharmacy told her he was banned from medical practice and they can no longer fill anything prescribed by him.  She is completely out of these meds and has scheduled an appt for 11/03/2019 w/you to see about getting these prescriptions taken over by you.  Until then she wants to know if you could call in enough to hold her over until her appt next week?  Or will she need to go to Urgent Care?  Please advise.  [facility] [address] in [place] is her pharmacy, ph [phone].  Thank you!

## 2019-10-26 MED ORDER — AMLODIPINE BESYLATE 10 MG PO TABS: 10.0000 mg | ORAL_TABLET | Freq: Every day | ORAL | 11 refills | Status: DC

## 2019-10-26 MED ORDER — LEVOTHYROXINE SODIUM 75 MCG PO TABS: ORAL_TABLET | ORAL | 11 refills | Status: DC

## 2019-10-26 NOTE — Telephone Encounter (Signed)
Patient called in to check on status. Please advise.

## 2019-10-26 NOTE — Telephone Encounter (Signed)
Rx sent electronically.  

## 2019-11-01 ENCOUNTER — Telehealth: Payer: Self-pay

## 2019-11-01 ENCOUNTER — Emergency Department
Admission: EM | Admit: 2019-11-01 | Discharge: 2019-11-02 | Disposition: A | Discharge status: Home / Self Care | Payer: Medicaid Other | Attending: Emergency Medicine | Admitting: Emergency Medicine

## 2019-11-01 ENCOUNTER — Encounter: Payer: Self-pay | Admitting: Emergency Medicine

## 2019-11-01 ENCOUNTER — Other Ambulatory Visit: Payer: Self-pay

## 2019-11-01 DIAGNOSIS — E1165 Type 2 diabetes mellitus with hyperglycemia: Secondary | ICD-10-CM | POA: Diagnosis not present

## 2019-11-01 DIAGNOSIS — E039 Hypothyroidism, unspecified: Secondary | ICD-10-CM | POA: Insufficient documentation

## 2019-11-01 DIAGNOSIS — I10 Essential (primary) hypertension: Secondary | ICD-10-CM | POA: Diagnosis not present

## 2019-11-01 DIAGNOSIS — F419 Anxiety disorder, unspecified: Secondary | ICD-10-CM | POA: Insufficient documentation

## 2019-11-01 DIAGNOSIS — Z79899 Other long term (current) drug therapy: Secondary | ICD-10-CM | POA: Diagnosis not present

## 2019-11-01 DIAGNOSIS — E860 Dehydration: Secondary | ICD-10-CM | POA: Insufficient documentation

## 2019-11-01 DIAGNOSIS — Z87891 Personal history of nicotine dependence: Secondary | ICD-10-CM | POA: Diagnosis not present

## 2019-11-01 LAB — GLUCOSE, CAPILLARY
Glucose-Capillary: 304 mg/dL — ABNORMAL HIGH (ref 70–99)
Glucose-Capillary: 357 mg/dL — ABNORMAL HIGH (ref 70–99)

## 2019-11-01 LAB — URINALYSIS, COMPLETE (UACMP) WITH MICROSCOPIC
Bacteria, UA: NONE SEEN
Bilirubin Urine: NEGATIVE
Glucose, UA: >=500 mg/dL — AB
Ketones, ur: 20 mg/dL — AB
Nitrite: NEGATIVE
Protein, ur: NEGATIVE mg/dL
Specific Gravity, Urine: 1.032 — ABNORMAL HIGH (ref 1.005–1.030)
pH: 6 (ref 5.0–8.0)

## 2019-11-01 LAB — COMPREHENSIVE METABOLIC PANEL
ALT: 19 U/L (ref 0–44)
ALT: 17 U/L (ref 0–44)
AST: 17 U/L (ref 15–41)
AST: 18 U/L (ref 15–41)
Albumin: 4.1 g/dL (ref 3.5–5.0)
Albumin: 3.3 g/dL — ABNORMAL LOW (ref 3.5–5.0)
Alkaline Phosphatase: 144 U/L — ABNORMAL HIGH (ref 38–126)
Alkaline Phosphatase: 115 U/L (ref 38–126)
Anion gap: 16 — ABNORMAL HIGH (ref 5–15)
Anion gap: 10 (ref 5–15)
BUN: 8 mg/dL (ref 6–20)
BUN: 6 mg/dL (ref 6–20)
CO2: 21 mmol/L — ABNORMAL LOW (ref 22–32)
CO2: 24 mmol/L (ref 22–32)
Calcium: 9.5 mg/dL (ref 8.9–10.3)
Calcium: 8.4 mg/dL — ABNORMAL LOW (ref 8.9–10.3)
Chloride: 94 mmol/L — ABNORMAL LOW (ref 98–111)
Chloride: 102 mmol/L (ref 98–111)
Creatinine, Ser: 0.64 mg/dL (ref 0.44–1.00)
Creatinine, Ser: 0.57 mg/dL (ref 0.44–1.00)
GFR, Estimated: >60 mL/min (ref >60)
GFR, Estimated: >60 mL/min (ref >60)
Glucose, Bld: 536 mg/dL (ref 70–99)
Glucose, Bld: 316 mg/dL — ABNORMAL HIGH (ref 70–99)
Potassium: 3.5 mmol/L (ref 3.5–5.1)
Potassium: 3.3 mmol/L — ABNORMAL LOW (ref 3.5–5.1)
Sodium: 131 mmol/L — ABNORMAL LOW (ref 135–145)
Sodium: 136 mmol/L (ref 135–145)
Total Bilirubin: 1 mg/dL (ref 0.3–1.2)
Total Bilirubin: 0.8 mg/dL (ref 0.3–1.2)
Total Protein: 7.5 g/dL (ref 6.5–8.1)
Total Protein: 6 g/dL — ABNORMAL LOW (ref 6.5–8.1)

## 2019-11-01 LAB — CBC
HCT: 41.9 % (ref 36.0–46.0)
Hemoglobin: 14.7 g/dL (ref 12.0–15.0)
MCH: 29.8 pg (ref 26.0–34.0)
MCHC: 35.1 g/dL (ref 30.0–36.0)
MCV: 85 fL (ref 80.0–100.0)
Platelets: 274 10*3/uL (ref 150–400)
RBC: 4.93 MIL/uL (ref 3.87–5.11)
RDW: 12.7 % (ref 11.5–15.5)
WBC: 9.9 10*3/uL (ref 4.0–10.5)
nRBC: 0 % (ref 0.0–0.2)

## 2019-11-01 LAB — T4, FREE: Free T4: 1.19 ng/dL — ABNORMAL HIGH (ref 0.61–1.12)

## 2019-11-01 LAB — PREGNANCY, URINE: Preg Test, Ur: NEGATIVE

## 2019-11-01 LAB — TSH: TSH: 0.257 u[IU]/mL — ABNORMAL LOW (ref 0.350–4.500)

## 2019-11-01 LAB — TROPONIN I (HIGH SENSITIVITY)
Troponin I (High Sensitivity): 9 ng/L (ref <18)
Troponin I (High Sensitivity): 10 ng/L (ref <18)

## 2019-11-01 MED ORDER — HYDROXYZINE HCL 25 MG PO TABS
25.0000 mg | ORAL_TABLET | Freq: Once | ORAL | Status: AC
  Administered 2019-11-01: 25 mg via ORAL
  Filled 2019-11-01: qty 1

## 2019-11-01 MED ORDER — ONDANSETRON 4 MG PO TBDP
4.0000 mg | ORAL_TABLET | Freq: Once | ORAL | Status: AC
  Administered 2019-11-01: 4 mg via ORAL
  Filled 2019-11-01: qty 1

## 2019-11-01 MED ORDER — INSULIN ASPART 100 UNIT/ML ~~LOC~~ SOLN
8.0000 [IU] | Freq: Once | SUBCUTANEOUS | Status: AC
  Administered 2019-11-01: 8 [IU] via INTRAVENOUS
  Filled 2019-11-01: qty 1

## 2019-11-01 MED ORDER — BLOOD GLUCOSE MONITOR KIT: PACK | 0 refills | Status: DC

## 2019-11-01 MED ORDER — METFORMIN HCL 500 MG PO TABS: 500.0000 mg | ORAL_TABLET | Freq: Two times a day (BID) | ORAL | 0 refills | Status: DC

## 2019-11-01 MED ORDER — ONDANSETRON 4 MG PO TBDP: 4.0000 mg | ORAL_TABLET | Freq: Three times a day (TID) | ORAL | 0 refills | Status: DC | PRN

## 2019-11-01 MED ORDER — SODIUM CHLORIDE 0.9 % IV BOLUS
1000.0000 mL | Freq: Once | INTRAVENOUS | Status: AC
  Administered 2019-11-01: 1000 mL via INTRAVENOUS

## 2019-11-01 MED ORDER — INSULIN ASPART 100 UNIT/ML ~~LOC~~ SOLN
6.0000 [IU] | Freq: Once | SUBCUTANEOUS | Status: AC
  Administered 2019-11-01: 6 [IU] via INTRAVENOUS
  Filled 2019-11-01: qty 1

## 2019-11-01 MED ORDER — LACTATED RINGERS IV BOLUS
1000.0000 mL | Freq: Once | INTRAVENOUS | Status: AC
  Administered 2019-11-01: 1000 mL via INTRAVENOUS

## 2019-11-01 MED ORDER — NAPROXEN 500 MG PO TABS
500.0000 mg | ORAL_TABLET | Freq: Once | ORAL | Status: AC
  Administered 2019-11-01: 500 mg via ORAL
  Filled 2019-11-01: qty 1

## 2019-11-01 MED ORDER — HYDROXYZINE HCL 25 MG PO TABS: 25.0000 mg | ORAL_TABLET | Freq: Three times a day (TID) | ORAL | 0 refills | Status: DC | PRN

## 2019-11-01 MED ORDER — METFORMIN HCL 500 MG PO TABS
500.0000 mg | ORAL_TABLET | Freq: Once | ORAL | Status: AC
  Administered 2019-11-01: 500 mg via ORAL
  Filled 2019-11-01: qty 1

## 2019-11-01 NOTE — Telephone Encounter (Signed)
[facility] - Client TELEPHONE ADVICE RECORD [facility] Patient Name: Anita Massey Gender: Female DOB: Nov 19, 1980 Age: 39 Y 28 M 2 D Return Phone Number: [phone] (Primary) Address[place]  [address] Client Home [facility] [facility] - Client Client Site [place] - Day Physician Viviana Simpler- MD Contact Type Call Who Is Calling Patient / Member / Family / Caregiver Call Type Triage / Clinical Relationship To Patient Self Return Phone Number [phone] (Primary) Chief Complaint FAINTING or [place] Reason for Call Symptomatic / Request for [place] states she feels strange, vomit, pass out, and feels like she is going to die. Translation No Nurse Assessment Nurse: Jimmye Norman, RN, [place] Date/Time (Eastern Time): 11/01/2019 10:47:48 AM Confirm and document reason for call. If symptomatic, describe symptoms. ---Caller states she feels strange she feels like she could vomit and pass out, and feels like she is going to die. States the feeling comes out of nowhere, and lasts for a few minutes and then subsides. Does the patient have any new or worsening symptoms? ---Yes Will a triage be completed? ---Yes Related visit to physician within the last 2 weeks? ---No Does the PT have any chronic conditions? (i.e. diabetes, asthma, this includes High risk factors for pregnancy, etc.) ---Yes List chronic conditions. ---hypertension, hypothyroidism Is the patient pregnant or possibly pregnant? (Ask all females between the ages of 27-55) ---No Is this a behavioral health or substance abuse call? ---No Guidelines Guideline Title Affirmed Question Affirmed Notes Nurse Date/Time (Eastern Time) Nausea Patient sounds very sick or weak to the triager Jimmye Norman, RN, [place] 11/01/2019 10:48:34 AM Disp. Time Eilene Ghazi Time) Disposition Final User 11/01/2019 10:45:35 AM Send  to Urgent Ezra Sites 11/01/2019 10:57:04 AM Go to ED Now Yes Jimmye Norman, RN, [place] Disposition Overriden: Go to ED Now (or PCP triage) Override Reason: Patient's symptoms need a higher level of care PLEASE NOTE: All timestamps contained within this report are represented as [place] Standard Time. CONFIDENTIALTY NOTICE: This fax transmission is intended only for the addressee. It contains information that is legally privileged, confidential or otherwise protected from use or disclosure. If you are not the intended recipient, you are strictly prohibited from reviewing, disclosing, copying using or disseminating any of this information or taking any action in reliance on or regarding this information. If you have received this fax in error, please notify us immediately by telephone so that we can arrange for its return to [place]. Phone: [phone], Toll-Free: [phone], Fax: [phone] Page: 2 of 2 Call Id: [id number] [place] Disagree/Comply Comply Caller Understands Yes PreDisposition InappropriateToAsk Care Advice Given Per Guideline GO TO ED NOW: * You need to be seen in the Emergency Department. Comments User: Myriam Forehand, RN Date/Time (Eastern Time): 11/01/2019 10:58:11 AM Caller started having an episode while on the phone with nurse, riding in the car as a passenger. RN upgraded triage outcome due to caller stating her blood pressure yesterday ranged from 150/90-169/99. Referrals GO TO FACILITY UNDECIDED

## 2019-11-01 NOTE — ED Notes (Signed)
Pt presents to the ED for having recurrent episodes of "a funny feeling, I can't really explain" Pt states that it started a week ago and only happen at night but now they are more frequent. Pt states she states feeling lightheaded and nausea, episodes like only a couple of minutes. Pt states she only take medication for BP and thyroid problems. Pt states she does have a lot of stressors in his life. 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 Pt is A&Ox4 and NAD

## 2019-11-01 NOTE — ED Provider Notes (Signed)
[facility] Emergency Department Provider Note  ____________________________________________  Time seen: Approximately 2:21 PM  I have reviewed the triage vital signs and the nursing notes.   HISTORY  Chief Complaint Anxiety    HPI Anita Massey is a 39 y.o. female that presents to the emergency department for evaluation of multiple episodes of nausea and "feeling funny" since yesterday.  Patient states that she will have episodes the last 1 minute where she feels nauseous and a feeling of "doom." Patient states that episodes woke her up in the middle of the night.  She did have similar episodes after the birth of her last child and she had several miscarriages.  Patient states that she has also been more thirsty than usual.  She also woke up with leg cramps.  No recent illness.  She denies any pain.  No headache, shortness of breath, chest pain.  Past Medical History:  Diagnosis Date  . Anemia   . Anxiety state, unspecified   . 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 Clotting disorder ([place])   . GERD (gastroesophageal reflux disease)    OCC  . Homozygous for MTHFR gene mutation   . Hypertension   . Hypothyroidism   . Multiple joint pain 09/04/2015  . Obesity   . Rosacea   . Undiagnosed cardiac murmurs   . Unspecified essential hypertension     Patient Active Problem List   Diagnosis Date Noted  . Prediabetes 06/11/2019  . Uterine scar from previous cesarean delivery affecting pregnancy 11/14/2018  . Hypertension in pregnancy, essential, antepartum, third trimester 11/13/2018  . Supervision of high risk pregnancy, antepartum 04/01/2018  . Hypothyroidism affecting pregnancy, antepartum 04/01/2018  . 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 MTHFR gene mutation 03/31/2018  . Chronic hypertension in pregnancy 03/31/2018  . Preventative health care 01/21/2018  . IBS (irritable bowel syndrome) 09/19/2017  . Asthmatic  bronchitis 11/08/2016  . Adrenal adenoma 09/11/2016  . Panic anxiety syndrome 07/23/2016  . Allergic urticaria   . Rosacea 08/15/2015  . Labile hypertension 03/01/2013    Past Surgical History:  Procedure Laterality Date  . APPENDECTOMY  2009   [facility]  . CESAREAN SECTION  2016  . CESAREAN SECTION N/A 11/15/2018   Procedure: REPEAT CESAREAN SECTION;  Surgeon: Homero Fellers, MD;  Location: [facility] ORS;  Service: Obstetrics;  Laterality: N/A;  Female born @ 45 Apgars: 8/9 Weight: 6lbs 10 ozs  . CHOLECYSTECTOMY N/A 03/20/2016   Procedure: LAPAROSCOPIC CHOLECYSTECTOMY;  Surgeon: Jules Husbands, MD;  Location: [facility] ORS;  Service: General;  Laterality: N/A;  . DILATION AND CURETTAGE OF UTERUS      Prior to Admission medications   Medication Sig Start Date End Date Taking? Authorizing Provider  amLODipine (NORVASC) 10 MG tablet Take 1 tablet (10 mg total) by mouth daily. 10/26/19 10/25/20 Yes Venia Carbon, MD  levothyroxine (EUTHYROX) 75 MCG tablet TAKE 1 TABLET BY MOUTH ONCE DAILY BEFORE BREAKFAST 10/26/19  Yes Venia Carbon, MD  doxycycline (VIBRA-TABS) 100 MG tablet Take 1 tablet (100 mg total) by mouth 2 (two) times daily. Patient not taking: Reported on 11/01/2019 06/11/19   Viviana Simpler I, MD  hydrocortisone 2.5 % cream Apply topically 3 (three) times daily as needed. Patient not taking: Reported on 11/01/2019 04/28/19   Venia Carbon, MD    Allergies Biaxin [clarithromycin], Toradol [ketorolac tromethamine], Prednisone, Morphine and related, and Tape  Family History  Problem Relation Age of Onset  .  Mitral valve prolapse Mother   . Arrhythmia Mother   . Hypertension Mother   . Diabetes Mother   . Hypertension Father   . Hyperlipidemia Father   . Heart disease Maternal Grandfather   . Lung cancer Maternal Grandfather   . Cancer Maternal Grandfather   . Diabetes Brother   . Heart disease Maternal Aunt   . Diabetes Maternal Aunt   . Depression Maternal Aunt    . Cancer Maternal Grandmother        ovarian melanoma    Social History Social History   Tobacco Use  . Smoking status: Former Smoker    Packs/day: 0.25    Years: 1.00    Pack years: 0.25    Types: Cigarettes    Quit date: 03/13/1998    Years since quitting: 21.6  . Smokeless tobacco: Never Used  Vaping Use  . Vaping Use: Never used  Substance Use Topics  . Alcohol use: No  . Drug use: No     Review of Systems  Constitutional: No fever/chills Cardiovascular: No chest pain. Respiratory: No cough. No SOB. Gastrointestinal: No abdominal pain.  No nausea, no vomiting.  Musculoskeletal: Negative for musculoskeletal pain. Skin: Negative for rash, abrasions, lacerations, ecchymosis. Neurological: Negative for headaches, numbness or tingling   ____________________________________________   PHYSICAL EXAM:  VITAL SIGNS: ED Triage Vitals  Enc Vitals Group     BP 11/01/19 1255 (!) 165/90     Pulse Rate 11/01/19 1255 79     Resp 11/01/19 1255 20     Temp 11/01/19 1257 98 F (36.7 C)     Temp Source 11/01/19 1257 Oral     SpO2 11/01/19 1255 96 %     Weight 11/01/19 1243 194 lb 0.1 oz (88 kg)     Height 11/01/19 1243 5' 3"  (1.6 m)     Head Circumference --      Peak Flow --      Pain Score 11/01/19 1240 0     Pain Loc --      Pain Edu? --      Excl. in [place]? --      Constitutional: Alert and oriented. Well appearing and in no acute distress. Eyes: Conjunctivae are normal. PERRL. EOMI. Head: Atraumatic. ENT:      Ears:      Nose: No congestion/rhinnorhea.      Mouth/Throat: Mucous membranes are moist.  Neck: No stridor. Cardiovascular: Normal rate, regular rhythm.  Good peripheral circulation. Respiratory: Normal respiratory effort without tachypnea or retractions. Lungs CTAB. Good air entry to the bases with no decreased or absent breath sounds. Gastrointestinal: Bowel sounds 4 quadrants. Soft and nontender to palpation. No guarding or rigidity. No palpable  masses. No distention. Musculoskeletal: Full range of motion to all extremities. No gross deformities appreciated. Neurologic:  Normal speech and language. No gross focal neurologic deficits are appreciated.  Skin:  Skin is warm, dry and intact. No rash noted. Psychiatric: Mood and affect are normal. Speech and behavior are normal. Patient exhibits appropriate insight and judgement.   ____________________________________________   LABS (all labs ordered are listed, but only abnormal results are displayed)  Labs Reviewed  TSH - Abnormal; Notable for the following components:      Result Value   TSH 0.257 (*)    All other components within normal limits  COMPREHENSIVE METABOLIC PANEL - Abnormal; Notable for the following components:   Sodium 131 (*)    Chloride 94 (*)    CO2 21 (*)  Glucose, Bld 536 (*)    Alkaline Phosphatase 144 (*)    Anion gap 16 (*)    All other components within normal limits  URINALYSIS, COMPLETE (UACMP) WITH MICROSCOPIC - Abnormal; Notable for the following components:   Color, Urine STRAW (*)    APPearance HAZY (*)    Specific Gravity, Urine 1.032 (*)    Glucose, UA >=500 (*)    Hgb urine dipstick MODERATE (*)    Ketones, ur 20 (*)    Leukocytes,Ua TRACE (*)    All other components within normal limits  T4, FREE - Abnormal; Notable for the following components:   Free T4 1.19 (*)    All other components within normal limits  CBC  PREGNANCY, URINE  TROPONIN I (HIGH SENSITIVITY)  TROPONIN I (HIGH SENSITIVITY)   ____________________________________________  EKG   ____________________________________________  RADIOLOGY   No results found.  ____________________________________________    PROCEDURES  Procedure(s) performed:    Procedures    Medications  hydrOXYzine (ATARAX/VISTARIL) tablet 25 mg (25 mg Oral Given 11/01/19 1519)  ondansetron (ZOFRAN-ODT) disintegrating tablet 4 mg (4 mg Oral Given 11/01/19 1519)  sodium chloride  0.9 % bolus 1,000 mL (1,000 mLs Intravenous New Bag/Given 11/01/19 1701)  lactated ringers bolus 1,000 mL (1,000 mLs Intravenous New Bag/Given 11/01/19 1801)  insulin aspart (novoLOG) injection 8 Units (8 Units Intravenous Given 11/01/19 1815)     ____________________________________________   INITIAL IMPRESSION / ASSESSMENT AND PLAN / ED COURSE  Pertinent labs & imaging results that were available during my care of the patient were reviewed by me and considered in my medical decision making (see chart for details).  Review of the [place] CSRS was performed in accordance of the [place] prior to dispensing any controlled drugs.   Patient presented to emergency department for evaluation of frequent episodes of anxiety since yesterday.  Lab work notable for sodium 131, chloride 94, CO2 21, glucose 536, alk phos 144, anion gap 16 and TSH 0.275.  Patient has a history of diabetes but has never been diagnosed with diabetes in the past.  She is on levothyroxine for hypothyroidism.  Due to short nursing resources, patient will be moved to the main side of the emergency department for continued management of her hyperglycemia.  Care was transferred to Dr. Joni Fears.   Anita Massey was evaluated in Emergency Department on 11/01/2019 for the symptoms described in the history of present illness. She was evaluated in the context of the global COVID-19 pandemic, which necessitated consideration that the patient might be at risk for infection with the SARS-CoV-2 virus that causes COVID-19. Institutional protocols and algorithms that pertain to the evaluation of patients at risk for COVID-19 are in a state of rapid change based on information released by regulatory bodies including the CDC and federal and state organizations. These policies and algorithms were followed during the patient's care in the ED.    NEW MEDICATIONS STARTED DURING THIS VISIT:  ED Discharge Orders    None          This chart was  dictated using voice recognition software/Dragon. Despite best efforts to proofread, errors can occur which can change the meaning. Any change was purely unintentional.    Laban Emperor, PA-C 11/01/19 1844    Carrie Mew, MD 11/01/19 Tiana Loft, MD 11/01/19 2320

## 2019-11-01 NOTE — ED Notes (Signed)
Date and time results received: 11/01/19 1623  Test: Glucose Critical Value: 536  Name of Provider Notified: Caryl Pina, [place]

## 2019-11-01 NOTE — Telephone Encounter (Signed)
Per chart review tab pt is at [facility] ED. 

## 2019-11-01 NOTE — Telephone Encounter (Signed)
In the ER now----will await their evaluation. Sounds stable per triage note----?emotional based?

## 2019-11-01 NOTE — ED Triage Notes (Signed)
Pt to ER states she has been having "episodes" with intense feelings and fears.  Pt also endorses concerns over possibility of having multiple medical conditions.  Pt has hx of anxiety but has been off medications for over a year.  Pt reports increase in stress levels.  Pt in NAD, VSS, skin warm and dry, resp even and nonlabored.

## 2019-11-03 ENCOUNTER — Encounter: Payer: Self-pay | Admitting: Internal Medicine

## 2019-11-03 ENCOUNTER — Ambulatory Visit (INDEPENDENT_AMBULATORY_CARE_PROVIDER_SITE_OTHER): Payer: Medicaid Other | Admitting: Internal Medicine

## 2019-11-03 ENCOUNTER — Other Ambulatory Visit: Payer: Self-pay

## 2019-11-03 ENCOUNTER — Ambulatory Visit: Payer: Medicaid Other | Admitting: Internal Medicine

## 2019-11-03 VITALS — BP 124/78 | HR 80 | Temp 97.8°F | Ht 63.0 in | Wt 175.0 lb

## 2019-11-03 DIAGNOSIS — E1042 Type 1 diabetes mellitus with diabetic polyneuropathy: Secondary | ICD-10-CM | POA: Insufficient documentation

## 2019-11-03 DIAGNOSIS — E0590 Thyrotoxicosis, unspecified without thyrotoxic crisis or storm: Secondary | ICD-10-CM | POA: Diagnosis not present

## 2019-11-03 DIAGNOSIS — F410 Panic disorder [episodic paroxysmal anxiety] without agoraphobia: Secondary | ICD-10-CM

## 2019-11-03 DIAGNOSIS — E119 Type 2 diabetes mellitus without complications: Secondary | ICD-10-CM | POA: Diagnosis not present

## 2019-11-03 DIAGNOSIS — E1159 Type 2 diabetes mellitus with other circulatory complications: Secondary | ICD-10-CM | POA: Insufficient documentation

## 2019-11-03 DIAGNOSIS — E1142 Type 2 diabetes mellitus with diabetic polyneuropathy: Secondary | ICD-10-CM | POA: Insufficient documentation

## 2019-11-03 MED ORDER — ALPRAZOLAM 0.25 MG PO TABS: 0.2500 mg | ORAL_TABLET | Freq: Two times a day (BID) | ORAL | 0 refills | Status: DC | PRN

## 2019-11-03 MED ORDER — METFORMIN HCL 1000 MG PO TABS: 1000.0000 mg | ORAL_TABLET | Freq: Two times a day (BID) | ORAL | 3 refills | Status: DC

## 2019-11-03 NOTE — Progress Notes (Signed)
 Subjective:    Patient ID: Anita Massey, female    DOB: 05/29/1980, 39 y.o.   MRN: [id number]  HPI Here for ER follow up----new diagnosis of diabetes This visit occurred during the SARS-CoV-2 public health emergency.  Safety protocols were in place, including screening questions prior to the visit, additional usage of staff PPE, and extensive cleaning of exam room while observing appropriate contact time as indicated for disinfecting solutions.   She had changed her eating in the past 6 weeks or so Lost 25#--with cutting out the sugared drinks, pasta, etc Reviewed ER records  Has just started metformin yesterday Taking bid---no clear issues with this Started testing sugars yesterday---414 in AM---then 300's Was 262 this morning  Wonders if she is having anxiety/panic Lots of stress---will be moving soon, 3 kids, etc Wonders about something for anxiety Happens repetitively for at least a week Hydroxyzine helps a little--but not taking away "the weird feeling"  Current Outpatient Medications on File Prior to Visit  Medication Sig Dispense Refill  . amLODipine (NORVASC) 10 MG tablet Take 1 tablet (10 mg total) by mouth daily. 30 tablet 11  . blood glucose meter kit and supplies KIT Dispense based on patient and insurance preference. Use up to four times daily as directed. (FOR ICD-9 250.00, 250.01). 1 each 0  . doxycycline (VIBRA-TABS) 100 MG tablet Take 1 tablet (100 mg total) by mouth 2 (two) times daily. 60 tablet 1  . hydrocortisone 2.5 % cream Apply topically 3 (three) times daily as needed. 28 g 3  . hydrOXYzine (ATARAX/VISTARIL) 25 MG tablet Take 1 tablet (25 mg total) by mouth 3 (three) times daily as needed for anxiety. 30 tablet 0  . levothyroxine (EUTHYROX) 75 MCG tablet TAKE 1 TABLET BY MOUTH ONCE DAILY BEFORE BREAKFAST 30 tablet 11  . metFORMIN (GLUCOPHAGE) 500 MG tablet Take 1 tablet (500 mg total) by mouth 2 (two) times daily with a meal. 60 tablet 0  . ondansetron  (ZOFRAN ODT) 4 MG disintegrating tablet Take 1 tablet (4 mg total) by mouth every 8 (eight) hours as needed for nausea or vomiting. 20 tablet 0   No current facility-administered medications on file prior to visit.    Allergies  Allergen Reactions  . Biaxin [Clarithromycin] Hives  . Toradol [Ketorolac Tromethamine]     Chest pain   . Prednisone Anxiety and Palpitations    "Makes me feel crazy"  . Morphine And Related     Chest Pain   . Tape Rash    Paper tape is fine    Past Medical History:  Diagnosis Date  . Anemia   . Anxiety state, unspecified   . 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 Clotting disorder (HCC)   . GERD (gastroesophageal reflux disease)    OCC  . Homozygous for MTHFR gene mutation   . Hypertension   . Hypothyroidism   . Multiple joint pain 09/04/2015  . Obesity   . Rosacea   . Undiagnosed cardiac murmurs   . Unspecified essential hypertension     Past Surgical History:  Procedure Laterality Date  . APPENDECTOMY  2009   [facility]  . CESAREAN SECTION  2016  . CESAREAN SECTION N/A 11/15/2018   Procedure: REPEAT CESAREAN SECTION;  Surgeon: Schuman, Christanna R, MD;  Location: [facility] ORS;  Service: Obstetrics;  Laterality: N/A;  Female born @ 00[birth year] Apgars: 8/9 Weight: 6lbs 10 ozs  . CHOLECYSTECTOMY N/A 03/20/2016   Procedure: LAPAROSCOPIC CHOLECYSTECTOMY;    Surgeon: Diego F Pabon, MD;  Location: [facility] ORS;  Service: General;  Laterality: N/A;  . DILATION AND CURETTAGE OF UTERUS      Family History  Problem Relation Age of Onset  . Mitral valve prolapse Mother   . Arrhythmia Mother   . Hypertension Mother   . Diabetes Mother   . Hypertension Father   . Hyperlipidemia Father   . Heart disease Maternal Grandfather   . Lung cancer Maternal Grandfather   . Cancer Maternal Grandfather   . Diabetes Brother   . Heart disease Maternal Aunt   . Diabetes Maternal Aunt   . Depression Maternal Aunt   . Cancer Maternal Grandmother         ovarian melanoma    Social History   Socioeconomic History  . Marital status: Married    Spouse name: Christopher  . Number of children: 3  . Years of education: Not on file  . Highest education level: Not on file  Occupational History  . Occupation: Homemaker  Tobacco Use  . Smoking status: Former Smoker    Packs/day: 0.25    Years: 1.00    Pack years: 0.25    Types: Cigarettes    Quit date: 03/13/1998    Years since quitting: 21.6  . Smokeless tobacco: Never Used  Vaping Use  . Vaping Use: Never used  Substance and Sexual Activity  . Alcohol use: No  . Drug use: No  . Sexual activity: Not Currently    Birth control/protection: I.U.D.    Comment: Mirena   Other Topics Concern  . Not on file  Social History Narrative          Social Determinants of Health   Financial Resource Strain: Low Risk   . Difficulty of Paying Living Expenses: Not hard at all  Food Insecurity: No Food Insecurity  . Worried About Running Out of Food in the Last Year: Never true  . Ran Out of Food in the Last Year: Never true  Transportation Needs: No Transportation Needs  . Lack of Transportation (Medical): No  . Lack of Transportation (Non-Medical): No  Physical Activity: Inactive  . Days of Exercise per Week: 0 days  . Minutes of Exercise per Session: 0 min  Stress: Stress Concern Present  . Feeling of Stress : Very much  Social Connections: Unknown  . Frequency of Communication with Friends and Family: More than three times a week  . Frequency of Social Gatherings with Friends and Family: More than three times a week  . Attends Religious Services: Patient refused  . Active Member of Clubs or Organizations: Patient refused  . Attends Club or Organization Meetings: Patient refused  . Marital Status: Married  Intimate Partner Violence: Not At Risk  . Fear of Current or Ex-Partner: No  . Emotionally Abused: No  . Physically Abused: No  . Sexually Abused: No   Review of  Systems Having some sleep problems---nocturia/thirst Vision is off---trouble reading    Objective:   Physical Exam Constitutional:      Appearance: Normal appearance.  Neurological:     Mental Status: She is alert.  Psychiatric:     Comments: Anxious and tearful at times Not clearly depressed            Assessment & Plan:   

## 2019-11-03 NOTE — Patient Instructions (Signed)
Please increase the metformin to 1000mg  in the morning and 500mg  with supper for the next 3 days---then increase to 1000mg  twice a day. Let me know if you have any problems with this. Check your sugars every morning and before supper for now. Let me know next week or two if your fasting sugars remain over 200--you will need a second medication Stop the thyroid medication

## 2019-11-03 NOTE — Assessment & Plan Note (Signed)
This has been exacerbated with recent stress (moving, raising 3 kids, now sick) Will hold off on daily meds---will give Rx for prn xanax

## 2019-11-03 NOTE — Assessment & Plan Note (Signed)
No problems with metformin---will increase to 1000mg  bid [facility] for counseling May need second med soon--if stays over 200 fasting

## 2019-11-03 NOTE — Assessment & Plan Note (Signed)
No biotin vitamins so seems real Thyroid palpable--but not really enlarged Will recheck levels after stopping the levothyroxine (diagnosed with hypothyroidism after last child a year ago or so)

## 2019-11-03 NOTE — Telephone Encounter (Signed)
[name] - Client TELEPHONE ADVICE RECORD [facility] Patient Name: Anita Massey Gender: Female DOB: [birth year] Age: 39 Y 51 M 3 D Return Phone Number: [phone] (Primary) Address: [place] [address] Client [facility] - Client Client Site [name] - Day Physician Viviana Simpler- MD Contact Type Call Who Is Calling Patient / Member / Family / Caregiver Call Type Triage / Clinical Relationship To Patient Self Return Phone Number [phone] (Primary) Chief Complaint CHEST PAIN (>=21 years) - pain, pressure, heaviness or tightness Reason for Call Symptomatic / Request for [name] states her sugar is 308 and she is feeling funny in her head and chest. Translation No Nurse Assessment Nurse: Cox, RN, [facility] Date/Time (Eastern Time): 11/02/2019 3:48:27 PM Confirm and document reason for call. If symptomatic, describe symptoms. ---Caller states her BS is 31, she feels 'funny' in her head and chest. Symptoms come and go. Does the patient have any new or worsening symptoms? ---Yes Will a triage be completed? ---Yes Related visit to physician within the last 2 weeks? ---Yes Does the PT have any chronic conditions? (i.e. diabetes, asthma, this includes High risk factors for pregnancy, etc.) ---Yes List chronic conditions. ---diabetic, HTN, hypothyroid Is the patient pregnant or possibly pregnant? (Ask all females between the ages of 60-55) ---No Is this a behavioral health or substance abuse call? ---No Guidelines Guideline Title Affirmed Question Affirmed Notes Nurse Date/Time Eilene Ghazi Time) Chest Pain Dizziness or lightheadedness Cox, RN, [facility] 70/62/3762 8:31:51 PM Disp. Time Eilene Ghazi Time) Disposition Final User 11/02/2019 3:46:53 PM Send to Urgent Queue Nicholes Calamity 11/02/2019 3:54:27 PM Go to ED Now Yes Cox, RN, [place] Caller  Disagree/Comply Disagree PLEASE NOTE: All timestamps contained within this report are represented as [place] Standard Time. CONFIDENTIALTY NOTICE: This fax transmission is intended only for the addressee. It contains information that is legally privileged, confidential or otherwise protected from use or disclosure. If you are not the intended recipient, you are strictly prohibited from reviewing, disclosing, copying using or disseminating any of this information or taking any action in reliance on or regarding this information. If you have received this fax in error, please notify us immediately by telephone so that we can arrange for its return to [place]. Phone: [phone], Toll-Free: [phone], Fax: [phone] Page: 2 of 2 Call Id: [id number] [place] Understands Yes PreDisposition Call a family member Care Advice Given Per Guideline GO TO ED NOW: * You need to be seen in the Emergency Department. NOTE TO TRIAGER - DRIVING: CARE ADVICE given per Chest Pain (Adult) guideline. Comments User: Cordie Grice, RN Date/Time (Eastern Time): 11/02/2019 3:49:26 PM in ER last night, found out she is diabetic, BS then was in 9[address] User: Cordie Grice, RN Date/Time (Eastern Time): 11/02/2019 3:53:05 PM States she has appointment with MD at 9:30 tomorrow Referrals [facility] REFUSED

## 2019-11-04 ENCOUNTER — Telehealth: Payer: Self-pay

## 2019-11-04 MED ORDER — DULOXETINE HCL 30 MG PO CPEP: 30.0000 mg | ORAL_CAPSULE | Freq: Every day | ORAL | 1 refills | Status: DC

## 2019-11-04 NOTE — Telephone Encounter (Signed)
Patient contacted PCP via MyChart message. Please see note.    [facility] - Client TELEPHONE ADVICE RECORD [facility] Patient Name: Anita Massey Gender: Female DOB: 06/02/1980 Age: 39 Y 72 M 5 D Return Phone Number: [phone] (Primary) Address: [place] [address] Client [facility] - Client Client Site [place] - Day Physician Viviana Simpler- MD Contact Type Call Who Is Calling Patient / Member / Family / Caregiver Call Type Triage / Clinical Relationship To Patient Self Return Phone Number [phone] (Primary) Chief Complaint Headache Reason for Call Symptomatic / Request for [place] states she is having headache, nausea and feels like her body is on fire. Just recently found out she was a diabetic. Translation No Nurse Assessment Nurse: Lovena Le, RN, [place] Glad Date/Time Eilene Ghazi Time): 11/04/2019 8:36:38 AM Confirm and document reason for call. If symptomatic, describe symptoms. ---Caller states she is a newly diagnosed diabetic. Fasting bs 234. She has a headache and has nauseated, feels as her body is on fire. Does the patient have any new or worsening symptoms? ---Yes Will a triage be completed? ---Yes Related visit to physician within the last 2 weeks? ---No Does the PT have any chronic conditions? (i.e. diabetes, asthma, this includes High risk factors for pregnancy, etc.) ---Yes List chronic conditions. ---niddm, hypothyroidism, htn Is the patient pregnant or possibly pregnant? (Ask all females between the ages of 61-55) ---No Is this a behavioral health or substance abuse call? ---No Guidelines Guideline Title Affirmed Question Affirmed Notes Nurse Date/Time (Eastern Time) Headache [1] SEVERE headache (e.g., excruciating) AND [2] "worst headache" of life Joie Bimler 11/04/2019 8:40:04 AM Disp. Time Eilene Ghazi Time) Disposition  Final User 11/04/2019 8:47:32 AM Go to ED Now (or PCP triage) Yes Lovena Le, RN, Ralene Bathe NOTE: All timestamps contained within this report are represented as [place] Standard Time. CONFIDENTIALTY NOTICE: This fax transmission is intended only for the addressee. It contains information that is legally privileged, confidential or otherwise protected from use or disclosure. If you are not the intended recipient, you are strictly prohibited from reviewing, disclosing, copying using or disseminating any of this information or taking any action in reliance on or regarding this information. If you have received this fax in error, please notify us immediately by telephone so that we can arrange for its return to [place]. Phone: [phone], Toll-Free: [phone], Fax: [phone] Page: 2 of 2 Call Id: [id number] [place] Disagree/Comply Comply Caller Understands Yes PreDisposition Did not know what to do Care Advice Given Per Guideline GO TO ED NOW (OR PCP TRIAGE): * ACETAMINOPHEN - REGULAR STRENGTH TYLENOL: Take 650 mg (two 325 mg pills) by mouth every 4 to 6 hours as needed. Each Regular Strength Tylenol pill has 325 mg of acetaminophen. The most you should take each day is 3,250 mg (10 pills a day). * IBUPROFEN (E.G., MOTRIN, ADVIL): Take 400 mg (two 200 mg pills) by mouth every 6 hours. The most you should take each day is 1,200 mg (six 200 mg pills), unless your doctor has told you to take more. ANOTHER ADULT SHOULD DRIVE: * It is better and safer if another adult drives instead of you. CARE ADVICE given per Headache (Adult) guideline. Comments User: Moishe Spice, RN Date/Time Eilene Ghazi Time): 11/04/2019 8:41:25 AM she referred to as having "feeling" of slight disorientation, blurred vision, and nausea User: Moishe Spice, RN Date/Time Eilene Ghazi Time): 11/04/2019 8:46:34 AM she was seen in ED 2 days ago  for "funny feeling" and developed head ache and feeling hot since she was in ed. She was advised  to call her PCP. She was seen in office yesterday, meds were adjusted. Her next follow up is 12/1 for re-evaluation. Referrals GO TO FACILITY UNDECIDED

## 2019-11-04 NOTE — Telephone Encounter (Signed)
See MyChart message

## 2019-11-04 NOTE — Telephone Encounter (Signed)
Pt called checking on rx

## 2019-11-04 NOTE — Telephone Encounter (Signed)
Pt called checking on rx  Best number [phone]

## 2019-11-05 DIAGNOSIS — F410 Panic disorder [episodic paroxysmal anxiety] without agoraphobia: Secondary | ICD-10-CM | POA: Diagnosis not present

## 2019-11-05 DIAGNOSIS — R519 Headache, unspecified: Secondary | ICD-10-CM | POA: Diagnosis not present

## 2019-11-05 DIAGNOSIS — Z20822 Contact with and (suspected) exposure to covid-19: Secondary | ICD-10-CM | POA: Diagnosis not present

## 2019-11-05 DIAGNOSIS — R002 Palpitations: Secondary | ICD-10-CM | POA: Diagnosis not present

## 2019-11-05 DIAGNOSIS — I499 Cardiac arrhythmia, unspecified: Secondary | ICD-10-CM | POA: Diagnosis not present

## 2019-11-07 ENCOUNTER — Other Ambulatory Visit: Payer: Self-pay

## 2019-11-07 ENCOUNTER — Encounter: Payer: Self-pay | Admitting: Emergency Medicine

## 2019-11-07 ENCOUNTER — Emergency Department
Admission: EM | Admit: 2019-11-07 | Discharge: 2019-11-07 | Disposition: A | Discharge status: Home / Self Care | Payer: Medicaid Other | Attending: Emergency Medicine | Admitting: Emergency Medicine

## 2019-11-07 DIAGNOSIS — Z7984 Long term (current) use of oral hypoglycemic drugs: Secondary | ICD-10-CM | POA: Diagnosis not present

## 2019-11-07 DIAGNOSIS — R531 Weakness: Secondary | ICD-10-CM

## 2019-11-07 DIAGNOSIS — Z79899 Other long term (current) drug therapy: Secondary | ICD-10-CM | POA: Insufficient documentation

## 2019-11-07 DIAGNOSIS — R000 Tachycardia, unspecified: Secondary | ICD-10-CM | POA: Diagnosis not present

## 2019-11-07 DIAGNOSIS — R110 Nausea: Secondary | ICD-10-CM | POA: Diagnosis not present

## 2019-11-07 DIAGNOSIS — Z20822 Contact with and (suspected) exposure to covid-19: Secondary | ICD-10-CM | POA: Diagnosis not present

## 2019-11-07 DIAGNOSIS — E860 Dehydration: Secondary | ICD-10-CM | POA: Diagnosis not present

## 2019-11-07 DIAGNOSIS — E039 Hypothyroidism, unspecified: Secondary | ICD-10-CM | POA: Insufficient documentation

## 2019-11-07 DIAGNOSIS — J45909 Unspecified asthma, uncomplicated: Secondary | ICD-10-CM | POA: Diagnosis not present

## 2019-11-07 DIAGNOSIS — G4489 Other headache syndrome: Secondary | ICD-10-CM | POA: Diagnosis not present

## 2019-11-07 DIAGNOSIS — E1165 Type 2 diabetes mellitus with hyperglycemia: Secondary | ICD-10-CM | POA: Diagnosis not present

## 2019-11-07 DIAGNOSIS — R519 Headache, unspecified: Secondary | ICD-10-CM | POA: Diagnosis not present

## 2019-11-07 DIAGNOSIS — I10 Essential (primary) hypertension: Secondary | ICD-10-CM | POA: Insufficient documentation

## 2019-11-07 DIAGNOSIS — R739 Hyperglycemia, unspecified: Secondary | ICD-10-CM

## 2019-11-07 DIAGNOSIS — R5381 Other malaise: Secondary | ICD-10-CM | POA: Diagnosis not present

## 2019-11-07 DIAGNOSIS — R5383 Other fatigue: Secondary | ICD-10-CM | POA: Diagnosis not present

## 2019-11-07 DIAGNOSIS — R52 Pain, unspecified: Secondary | ICD-10-CM | POA: Diagnosis not present

## 2019-11-07 DIAGNOSIS — Z87891 Personal history of nicotine dependence: Secondary | ICD-10-CM | POA: Insufficient documentation

## 2019-11-07 HISTORY — DX: Type 2 diabetes mellitus without complications: E11.9

## 2019-11-07 LAB — URINALYSIS, COMPLETE (UACMP) WITH MICROSCOPIC
Bacteria, UA: NONE SEEN
Bilirubin Urine: NEGATIVE
Glucose, UA: >=500 mg/dL — AB
Ketones, ur: 80 mg/dL — AB
Nitrite: NEGATIVE
Protein, ur: 100 mg/dL — AB
Specific Gravity, Urine: 1.014 (ref 1.005–1.030)
pH: 6 (ref 5.0–8.0)

## 2019-11-07 LAB — BASIC METABOLIC PANEL
Anion gap: 17 — ABNORMAL HIGH (ref 5–15)
Anion gap: 12 (ref 5–15)
BUN: 11 mg/dL (ref 6–20)
BUN: 10 mg/dL (ref 6–20)
CO2: 21 mmol/L — ABNORMAL LOW (ref 22–32)
CO2: 21 mmol/L — ABNORMAL LOW (ref 22–32)
Calcium: 9.5 mg/dL (ref 8.9–10.3)
Calcium: 8.1 mg/dL — ABNORMAL LOW (ref 8.9–10.3)
Chloride: 96 mmol/L — ABNORMAL LOW (ref 98–111)
Chloride: 103 mmol/L (ref 98–111)
Creatinine, Ser: 0.65 mg/dL (ref 0.44–1.00)
Creatinine, Ser: 0.56 mg/dL (ref 0.44–1.00)
GFR, Estimated: >60 mL/min (ref >60)
GFR, Estimated: >60 mL/min (ref >60)
Glucose, Bld: 274 mg/dL — ABNORMAL HIGH (ref 70–99)
Glucose, Bld: 235 mg/dL — ABNORMAL HIGH (ref 70–99)
Potassium: 3.5 mmol/L (ref 3.5–5.1)
Potassium: 3.6 mmol/L (ref 3.5–5.1)
Sodium: 134 mmol/L — ABNORMAL LOW (ref 135–145)
Sodium: 136 mmol/L (ref 135–145)

## 2019-11-07 LAB — CBC
HCT: 46.3 % — ABNORMAL HIGH (ref 36.0–46.0)
Hemoglobin: 16.2 g/dL — ABNORMAL HIGH (ref 12.0–15.0)
MCH: 29.7 pg (ref 26.0–34.0)
MCHC: 35 g/dL (ref 30.0–36.0)
MCV: 84.8 fL (ref 80.0–100.0)
Platelets: 349 10*3/uL (ref 150–400)
RBC: 5.46 MIL/uL — ABNORMAL HIGH (ref 3.87–5.11)
RDW: 12.5 % (ref 11.5–15.5)
WBC: 13 10*3/uL — ABNORMAL HIGH (ref 4.0–10.5)
nRBC: 0 % (ref 0.0–0.2)

## 2019-11-07 LAB — BLOOD GAS, VENOUS
Acid-Base Excess: 1.2 mmol/L (ref 0.0–2.0)
Bicarbonate: 25.1 mmol/L (ref 20.0–28.0)
O2 Saturation: 94.7 %
Patient temperature: 37
pCO2, Ven: 37 mmHg — ABNORMAL LOW (ref 44.0–60.0)
pH, Ven: 7.44 — ABNORMAL HIGH (ref 7.250–7.430)
pO2, Ven: 71 mmHg — ABNORMAL HIGH (ref 32.0–45.0)

## 2019-11-07 LAB — RESPIRATORY PANEL BY RT PCR (FLU A&B, COVID)
Influenza A by PCR: NEGATIVE
Influenza B by PCR: NEGATIVE
SARS Coronavirus 2 by RT PCR: NEGATIVE

## 2019-11-07 LAB — TROPONIN I (HIGH SENSITIVITY)
Troponin I (High Sensitivity): 14 ng/L (ref <18)
Troponin I (High Sensitivity): 14 ng/L (ref <18)

## 2019-11-07 MED ORDER — SODIUM CHLORIDE 0.9 % IV BOLUS
1000.0000 mL | Freq: Once | INTRAVENOUS | Status: AC
  Administered 2019-11-07: 1000 mL via INTRAVENOUS

## 2019-11-07 MED ORDER — ACETAMINOPHEN 500 MG PO TABS
1000.0000 mg | ORAL_TABLET | Freq: Once | ORAL | Status: AC
  Administered 2019-11-07: 1000 mg via ORAL
  Filled 2019-11-07: qty 2

## 2019-11-07 MED ORDER — DIPHENHYDRAMINE HCL 50 MG/ML IJ SOLN
25.0000 mg | Freq: Once | INTRAMUSCULAR | Status: AC
  Administered 2019-11-07: 25 mg via INTRAVENOUS
  Filled 2019-11-07: qty 1

## 2019-11-07 MED ORDER — METOCLOPRAMIDE HCL 5 MG/ML IJ SOLN
10.0000 mg | Freq: Once | INTRAMUSCULAR | Status: AC
  Administered 2019-11-07: 10 mg via INTRAVENOUS
  Filled 2019-11-07: qty 2

## 2019-11-07 MED ORDER — ONDANSETRON 4 MG PO TBDP: 4.0000 mg | ORAL_TABLET | Freq: Three times a day (TID) | ORAL | 0 refills | Status: AC | PRN

## 2019-11-07 NOTE — ED Provider Notes (Signed)
[facility] Emergency Department Provider Note  Time seen: 7:18 AM  I have reviewed the triage vital signs and the nursing notes.   HISTORY  Chief Complaint Weakness and Headache   HPI Anita Massey is a 39 y.o. female with a past medical history of anemia, anxiety, gastric reflux, hypertension, recently diagnosed diabetes presents to the emergency department for generalized fatigue and nausea.  According to the patient over the past month or so she has been feeling extremely nauseated, states she vomited early this morning.  Patient states she has been feeling very weak as well.  Patient came to the emergency department approximately 5 days ago and was diagnosed with new onset diabetes placed on Metformin 1000 mg twice daily.  Patient states she has been taking this medication.  Patient went to [facility] yesterday for generalized fatigue and weakness, said lab work was drawn and the patient was discharged home.  Patient states she continues to feel nauseated and vomited this morning so she came to the emergency department.   Patient denies any fever cough or congestion.  No abdominal pain or chest pain.  Past Medical History:  Diagnosis Date  . Anemia   . Anxiety state, unspecified   . Blood dyscrasia    PROTHROMBIN G 57903 HETEROZYGOSITY (FACTOR 2)  . Clotting disorder ([place])   . Diabetes mellitus without complication ([place])   . GERD (gastroesophageal reflux disease)    OCC  . Homozygous for MTHFR gene mutation   . Hypertension   . Hypothyroidism   . Multiple joint pain 09/04/2015  . Obesity   . Rosacea   . Undiagnosed cardiac murmurs   . Unspecified essential hypertension     Patient Active Problem List   Diagnosis Date Noted  . Newly diagnosed diabetes ([place]) 11/03/2019  . Hyperthyroidism 11/03/2019  . Uterine scar from previous cesarean delivery affecting pregnancy 11/14/2018  . Hypertension in pregnancy, essential, antepartum, third trimester  11/13/2018  . Supervision of high risk pregnancy, antepartum 04/01/2018  . Hypothyroidism affecting pregnancy, antepartum 04/01/2018  . 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 MTHFR gene mutation 03/31/2018  . Chronic hypertension in pregnancy 03/31/2018  . Preventative health care 01/21/2018  . IBS (irritable bowel syndrome) 09/19/2017  . Asthmatic bronchitis 11/08/2016  . Adrenal adenoma 09/11/2016  . Panic anxiety syndrome 07/23/2016  . Allergic urticaria   . Rosacea 08/15/2015  . Labile hypertension 03/01/2013    Past Surgical History:  Procedure Laterality Date  . APPENDECTOMY  2009   [facility]  . CESAREAN SECTION  2016  . CESAREAN SECTION N/A 11/15/2018   Procedure: REPEAT CESAREAN SECTION;  Surgeon: Homero Fellers, MD;  Location: [facility] ORS;  Service: Obstetrics;  Laterality: N/A;  Female born @ 60 Apgars: 8/9 Weight: 6lbs 10 ozs  . CHOLECYSTECTOMY N/A 03/20/2016   Procedure: LAPAROSCOPIC CHOLECYSTECTOMY;  Surgeon: Jules Husbands, MD;  Location: [facility] ORS;  Service: General;  Laterality: N/A;  . DILATION AND CURETTAGE OF UTERUS      Prior to Admission medications   Medication Sig Start Date End Date Taking? Authorizing Provider  ALPRAZolam (XANAX) 0.25 MG tablet Take 1 tablet (0.25 mg total) by mouth 2 (two) times daily as needed for anxiety. 11/03/19   Venia Carbon, MD  amLODipine (NORVASC) 10 MG tablet Take 1 tablet (10 mg total) by mouth daily. 10/26/19 10/25/20  Venia Carbon, MD  blood glucose meter kit and supplies KIT Dispense based on patient and insurance preference. Use up to  four times daily as directed. (FOR ICD-9 250.00, 250.01). 11/01/19   Carrie Mew, MD  doxycycline (VIBRA-TABS) 100 MG tablet Take 1 tablet (100 mg total) by mouth 2 (two) times daily. 06/11/19   Venia Carbon, MD  DULoxetine (CYMBALTA) 30 MG capsule Take 1 capsule (30 mg total) by mouth daily. 11/04/19   Venia Carbon, MD  hydrocortisone 2.5 % cream Apply topically 3  (three) times daily as needed. 04/28/19   Venia Carbon, MD  hydrOXYzine (ATARAX/VISTARIL) 25 MG tablet Take 1 tablet (25 mg total) by mouth 3 (three) times daily as needed for anxiety. 11/01/19   Carrie Mew, MD  metFORMIN (GLUCOPHAGE) 1000 MG tablet Take 1 tablet (1,000 mg total) by mouth 2 (two) times daily with a meal. 11/03/19   Venia Carbon, MD  ondansetron (ZOFRAN ODT) 4 MG disintegrating tablet Take 1 tablet (4 mg total) by mouth every 8 (eight) hours as needed for nausea or vomiting. 11/01/19   Carrie Mew, MD    Allergies  Allergen Reactions  . Biaxin [Clarithromycin] Hives  . Toradol [Ketorolac Tromethamine]     Chest pain   . Prednisone Anxiety and Palpitations    "Makes me feel crazy"  . Morphine And Related     Chest Pain   . Tape Rash    Paper tape is fine    Family History  Problem Relation Age of Onset  . Mitral valve prolapse Mother   . Arrhythmia Mother   . Hypertension Mother   . Diabetes Mother   . Hypertension Father   . Hyperlipidemia Father   . Heart disease Maternal Grandfather   . Lung cancer Maternal Grandfather   . Cancer Maternal Grandfather   . Diabetes Brother   . Heart disease Maternal Aunt   . Diabetes Maternal Aunt   . Depression Maternal Aunt   . Cancer Maternal Grandmother        ovarian melanoma    Social History Social History   Tobacco Use  . Smoking status: Former Smoker    Packs/day: 0.25    Years: 1.00    Pack years: 0.25    Types: Cigarettes    Quit date: 03/13/1998    Years since quitting: 21.6  . Smokeless tobacco: Never Used  Vaping Use  . Vaping Use: Never used  Substance Use Topics  . Alcohol use: No  . Drug use: No    Review of Systems Constitutional: Negative for fever. Cardiovascular: Negative for chest pain. Respiratory: Negative for shortness of breath. Gastrointestinal: Negative for abdominal pain.  Positive for nausea Genitourinary: Dark urine without dysuria Musculoskeletal:  Negative for musculoskeletal complaints Skin: Negative for skin complaints  Neurological: Moderate headache All other ROS negative  ____________________________________________   PHYSICAL EXAM:  VITAL SIGNS: ED Triage Vitals  Enc Vitals Group     BP 11/07/19 0543 (!) 148/85     Pulse Rate 11/07/19 0543 (!) 120     Resp 11/07/19 0543 18     Temp 11/07/19 0543 98.8 F (37.1 C)     Temp Source 11/07/19 0543 Oral     SpO2 11/07/19 0543 98 %     Weight 11/07/19 0546 175 lb (79.4 kg)     Height 11/07/19 0546 5' 2"  (1.575 m)     Head Circumference --      Peak Flow --      Pain Score 11/07/19 0546 10     Pain Loc --      Pain Edu? --  Excl. in [place]? --    Constitutional: Alert and oriented. Well appearing and in no distress. Eyes: Normal exam ENT      Head: Normocephalic and atraumatic.      Mouth/Throat: Mucous membranes are moist. Cardiovascular: Somewhat dry appearing mucous membranes. Respiratory: Normal respiratory effort without tachypnea nor retractions. Breath sounds are clear  Gastrointestinal: Soft and nontender. No distention.   Musculoskeletal: Nontender with normal range of motion in all extremities.  Neurologic:  Normal speech and language. No gross focal neurologic deficits  Skin:  Skin is warm, dry and intact.  Psychiatric: Mood and affect are normal.   ____________________________________________    EKG  EKG viewed and interpreted by myself shows sinus tachycardia 125 bpm with a narrow QRS, normal axis, normal intervals, nonspecific ST changes.  ____________________________________________    INITIAL IMPRESSION / ASSESSMENT AND PLAN / ED COURSE  Pertinent labs & imaging results that were available during my care of the patient were reviewed by me and considered in my medical decision making (see chart for details).   Patient presents to the emergency department for generalized fatigue weakness and nausea.  Symptoms have been ongoing x1 month per  patient.  Patient was recently diagnosed with new onset diabetes, patient found to be somewhat hyperglycemic at 274 with an anion gap of 17 in the emergency department.  We will check a VBG.  We will begin fairly aggressive IV hydration with 2 L of normal saline.  Patient is also stating headache which she relates to feeling dehydrated.  We will dose Tylenol, Reglan and Benadryl in addition to IV fluids.  We will obtain a urinalysis.  Patient does have EKG changes not seen on her prior EKG however reassuringly troponin is negative.  We will continue to reassess after adequate hydration.  ABG shows no signs of DKA.  Patient's repeat metabolic panel after 2 L show significant improvement with a anion gap of 12.  Patient has since received a 3rd L of fluid.  Patient states she is feeling better.  We will discharge the patient home.  I discussed continued oral hydration of nonsugar-containing fluids.  I also discussed dietary precautions for diabetes and will provide information for diabetic diet.  I also discussed CBG checks at least 3 times daily and PCP follow-up this week.  Patient agreeable to plan of care.   Anita Massey was evaluated in Emergency Department on 11/07/2019 for the symptoms described in the history of present illness. She was evaluated in the context of the global COVID-19 pandemic, which necessitated consideration that the patient might be at risk for infection with the SARS-CoV-2 virus that causes COVID-19. Institutional protocols and algorithms that pertain to the evaluation of patients at risk for COVID-19 are in a state of rapid change based on information released by regulatory bodies including the CDC and federal and state organizations. These policies and algorithms were followed during the patient's care in the ED.  ____________________________________________   FINAL CLINICAL IMPRESSION(S) / ED DIAGNOSES  Weakness Dehydration Hyperglycemia   Harvest Dark,  MD 11/07/19 1112

## 2019-11-07 NOTE — ED Triage Notes (Signed)
Patient brought in by ems from home. Patient with complaint of weakness, headache and nausea for a couple of days. Patient was seen at [facility] yesterday and says she was not told anything.

## 2019-11-07 NOTE — ED Notes (Signed)
Pt endorsing disorientation intermittently. Pt unable to keep food down. Having hot flashed. Bilat UE and head tingling. Pt endorsing headache (frontal). Blurry vision. Symptoms started "a couple days ago, But ive been having the nausea...tingling and hot sensations just started last night the day before.

## 2019-11-07 NOTE — Discharge Instructions (Signed)
As we discussed please drink plenty of nonsugary fluids.  Please follow-up with your primary care doctor tomorrow for recheck/reevaluation.  Please check your blood sugar 3 times daily before eating.  Return to the emergency department for return of/worsening weakness, signs of dehydration or if you are unable to drink adequate fluids.  Please continue to take your Metformin twice daily.  You may take your prescribed Zofran as needed for nausea, as written.  As we discussed please watch your total sugar intake and avoid foods high in sugar or carbohydrates.

## 2019-11-08 ENCOUNTER — Encounter: Payer: Self-pay | Admitting: Internal Medicine

## 2019-11-08 ENCOUNTER — Ambulatory Visit (INDEPENDENT_AMBULATORY_CARE_PROVIDER_SITE_OTHER): Payer: Medicaid Other | Admitting: Internal Medicine

## 2019-11-08 ENCOUNTER — Telehealth: Payer: Self-pay

## 2019-11-08 VITALS — BP 126/90 | HR 113 | Temp 97.8°F | Ht 63.0 in | Wt 167.0 lb

## 2019-11-08 DIAGNOSIS — E0590 Thyrotoxicosis, unspecified without thyrotoxic crisis or storm: Secondary | ICD-10-CM

## 2019-11-08 DIAGNOSIS — E119 Type 2 diabetes mellitus without complications: Secondary | ICD-10-CM

## 2019-11-08 DIAGNOSIS — D3500 Benign neoplasm of unspecified adrenal gland: Secondary | ICD-10-CM

## 2019-11-08 DIAGNOSIS — F410 Panic disorder [episodic paroxysmal anxiety] without agoraphobia: Secondary | ICD-10-CM | POA: Diagnosis not present

## 2019-11-08 LAB — RENAL FUNCTION PANEL
Albumin: 4.2 g/dL (ref 3.5–5.2)
BUN: 8 mg/dL (ref 6–23)
CO2: 24 mEq/L (ref 19–32)
Calcium: 9.7 mg/dL (ref 8.4–10.5)
Chloride: 96 mEq/L (ref 96–112)
Creatinine, Ser: 0.6 mg/dL (ref 0.40–1.20)
GFR: 113.12 mL/min (ref >60.00)
Glucose, Bld: 263 mg/dL — ABNORMAL HIGH (ref 70–99)
Phosphorus: 2.6 mg/dL (ref 2.3–4.6)
Potassium: 4 mEq/L (ref 3.5–5.1)
Sodium: 135 mEq/L (ref 135–145)

## 2019-11-08 LAB — CORTISOL: Cortisol, Plasma: 20.1 ug/dL

## 2019-11-08 LAB — T4, FREE: Free T4: 1.27 ng/dL (ref 0.60–1.60)

## 2019-11-08 LAB — TSH: TSH: 1.57 u[IU]/mL (ref 0.35–4.50)

## 2019-11-08 MED ORDER — SERTRALINE HCL 50 MG PO TABS: 50.0000 mg | ORAL_TABLET | Freq: Every day | ORAL | 3 refills | Status: DC

## 2019-11-08 MED ORDER — GLIPIZIDE ER 5 MG PO TB24: 5.0000 mg | ORAL_TABLET | Freq: Every day | ORAL | 1 refills | Status: DC

## 2019-11-08 NOTE — Telephone Encounter (Signed)
Per appt notes Dr Bill Salinas an appt today for pt at 1:30.

## 2019-11-08 NOTE — Assessment & Plan Note (Signed)
Variable labs since being off the levothyroxine  Will check again

## 2019-11-08 NOTE — Assessment & Plan Note (Addendum)
Really out of sorts Now living with mom with her kids---really out of sorts Not clear if duloxetine is part of the problem or not She remembers doing better on zoloft---will change to this

## 2019-11-08 NOTE — Telephone Encounter (Signed)
Anita Massey - Client TELEPHONE ADVICE RECORD [facility] Patient Name: Anita Massey Gender: Female DOB: 04-07-1980 Age: 39 Y 50 M 9 D Return Phone Number: [phone] (Primary) Address: City/State/Zip: [name] [address] Client [facility] Massey - Client Client Site [place] - [facility]sey Physician Viviana Simpler- MD Contact Type Call Who Is Calling Patient / Member / Family / Caregiver Call Type Triage / Clinical Relationship To Patient Self Return Phone Number [phone] (Primary) Chief Complaint CHEST PAIN (>=21 years) - pain, pressure, heaviness or tightness Reason for Call Symptomatic / Request for [place] said she is having terrible tingling in her upper body for the last few days. Caller said she is having hot sensations in the same areas. Caller said this feeling is in her chest, shoulders and up. Caller has no appetite, nausea, and was recently diagnosed with diabetes. Caller seems to be having extremely slow speech. Translation No Nurse Assessment Nurse: Wynetta Emery, RN, Baker Janus Date/Time Eilene Ghazi Time): 11/08/2019 9:25:32 AM Confirm and document reason for call. If symptomatic, describe symptoms. ---Joelene Millin states she is having terrible tingling in her upper body also notes she is having hot sensations in the same areas. She states she is having this feeling is in her chest, shoulders upper body. She has no appetite, nausea, and was recently diagnosed with diabetes. Has racing heart She had extremely slow speech. Does the patient have any new or worsening symptoms? ---Yes Will a triage be completed? ---Yes Related visit to physician within the last 2 weeks? ---No Does the PT have any chronic conditions? (i.e. diabetes, asthma, this includes High risk factors for pregnancy, etc.) ---Yes List chronic conditions. ---Depression/Anxiety HTN Thyroid Diabetes 2 Is the patient  pregnant or possibly pregnant? (Ask all females between the ages of 69-55) ---No Is this a behavioral health or substance abuse call? ---No Guidelines Guideline Title Affirmed Question Affirmed Notes Nurse Date/Time (Eastern Time) Chest Pain [1] Chest pain lasts > 5 minutes AND [2] Ivin Booty 11/08/2019 9:31:10 AM PLEASE NOTE: All timestamps contained within this report are represented as [place] Standard Time. CONFIDENTIALTY NOTICE: This fax transmission is intended only for the addressee. It contains information that is legally privileged, confidential or otherwise protected from use or disclosure. If you are not the intended recipient, you are strictly prohibited from reviewing, disclosing, copying using or disseminating any of this information or taking any action in reliance on or regarding this information. If you have received this fax in error, please notify us immediately by telephone so that we can arrange for its return to [place]. Phone: [phone], Toll-Free: [phone], Fax: [phone] Page: 2 of 2 Call Id: [id number] Guidelines Guideline Title Affirmed Question Affirmed Notes Nurse Date/Time Eilene Ghazi Time) occurred > 3 days ago (72 hours) AND [3] NO chest pain or cardiac symptoms now Disp. Time Eilene Ghazi Time) Disposition Final User 11/08/2019 9:23:30 AM Send to Urgent Queue Sundra Aland [id number]/[id number] 5:88:50 AM See PCP within 24 Hours Yes Wynetta Emery, RN, Christin Bach Disagree/Comply Comply Caller Understands Yes PreDisposition Call Doctor Care Advice Given Per Guideline SEE PCP WITHIN 24 HOURS: * Difficulty breathing or unusual sweating occurs * Chest pain increases in frequency, duration or severity * Chest pain lasts over 5 minutes * You become worse CARE ADVICE given per Chest Pain (Adult) guideline. Comments User: Michele Rockers, RN Date/Time Eilene Ghazi Time): 11/08/2019 9:30:45 AM RN NOTE; She just got started on the antidepressant and anxiety medication and  may be  having side effects of them. Referrals REFERRED TO PCP OFFICE

## 2019-11-08 NOTE — Patient Instructions (Signed)
Please stop the duloxetine. Decrease the metformin to 500mg  twice a day. Add the new medication for diabetes---glipizide---once daily with breakfast Add the sertraline 50mg  daily

## 2019-11-08 NOTE — Progress Notes (Signed)
Subjective:    Patient ID: Anita Massey, female    DOB: 05/11/1980, 39 y.o.   MRN: [id number]  HPI Here with mom due to ongoing tingling sensations in head and upper body This visit occurred during the SARS-CoV-2 public health emergency.  Safety protocols were in place, including screening questions prior to the visit, additional usage of staff PPE, and extensive cleaning of exam room while observing appropriate contact time as indicated for disinfecting solutions.   Has been awakening her Nausea and loss of appetite Some headaches Heart is racing 2 ER visits---has gotten IV fluids, and then she has bee sent home  Mom has tried to get her to eat--but she hasn't  Has tried a couple of xanax--may have helped some Has taken the duloxetine--may have helped some  Nausea predated the metformin----but is worse since then  The hot feelings and tingling seems to be only since the diabetes diagnosis Mom notes she was having some of the panic type symptoms even before starting the metformin  Current Outpatient Medications on File Prior to Visit  Medication Sig Dispense Refill  . ALPRAZolam (XANAX) 0.25 MG tablet Take 1 tablet (0.25 mg total) by mouth 2 (two) times daily as needed for anxiety. 30 tablet 0  . amLODipine (NORVASC) 10 MG tablet Take 1 tablet (10 mg total) by mouth daily. 30 tablet 11  . blood glucose meter kit and supplies KIT Dispense based on patient and insurance preference. Use up to four times daily as directed. (FOR ICD-9 250.00, 250.01). 1 each 0  . DULoxetine (CYMBALTA) 30 MG capsule Take 1 capsule (30 mg total) by mouth daily. 30 capsule 1  . hydrocortisone 2.5 % cream Apply topically 3 (three) times daily as needed. 28 g 3  . hydrOXYzine (ATARAX/VISTARIL) 25 MG tablet Take 1 tablet (25 mg total) by mouth 3 (three) times daily as needed for anxiety. 30 tablet 0  . metFORMIN (GLUCOPHAGE) 1000 MG tablet Take 1 tablet (1,000 mg total) by mouth 2 (two) times daily with a  meal. 180 tablet 3  . ondansetron (ZOFRAN ODT) 4 MG disintegrating tablet Take 1 tablet (4 mg total) by mouth every 8 (eight) hours as needed for nausea or vomiting. 20 tablet 0   No current facility-administered medications on file prior to visit.    Allergies  Allergen Reactions  . Biaxin [Clarithromycin] Hives  . Toradol [Ketorolac Tromethamine]     Chest pain   . Prednisone Anxiety and Palpitations    "Makes me feel crazy"  . Morphine And Related     Chest Pain   . Tape Rash    Paper tape is fine    Past Medical History:  Diagnosis Date  . Anemia   . Anxiety state, unspecified   . Blood dyscrasia    PROTHROMBIN G 97588 HETEROZYGOSITY (FACTOR 2)  . Clotting disorder ([place])   . Diabetes mellitus without complication ([place])   . GERD (gastroesophageal reflux disease)    OCC  . Homozygous for MTHFR gene mutation   . Hypertension   . Hypothyroidism   . Multiple joint pain 09/04/2015  . Obesity   . Rosacea   . Undiagnosed cardiac murmurs   . Unspecified essential hypertension     Past Surgical History:  Procedure Laterality Date  . APPENDECTOMY  2009   [facility]  . CESAREAN SECTION  2016  . CESAREAN SECTION N/A 11/15/2018   Procedure: REPEAT CESAREAN SECTION;  Surgeon: Homero Fellers, MD;  Location: [facility] ORS;  Service: Obstetrics;  Laterality: N/A;  Female born @ 54 Apgars: 8/9 Weight: 6lbs 10 ozs  . CHOLECYSTECTOMY N/A 03/20/2016   Procedure: LAPAROSCOPIC CHOLECYSTECTOMY;  Surgeon: Jules Husbands, MD;  Location: [facility] ORS;  Service: General;  Laterality: N/A;  . DILATION AND CURETTAGE OF UTERUS      Family History  Problem Relation Age of Onset  . Mitral valve prolapse Mother   . Arrhythmia Mother   . Hypertension Mother   . Diabetes Mother   . Hypertension Father   . Hyperlipidemia Father   . Heart disease Maternal Grandfather   . Lung cancer Maternal Grandfather   . Cancer Maternal Grandfather   . Diabetes Brother   . Heart disease Maternal Aunt   .  Diabetes Maternal Aunt   . Depression Maternal Aunt   . Cancer Maternal Grandmother        ovarian melanoma    Social History   Socioeconomic History  . Marital status: Married    Spouse name: Harrell Gave  . Number of children: 3  . Years of education: Not on file  . Highest education level: Not on file  Occupational History  . Occupation: Homemaker  Tobacco Use  . Smoking status: Former Smoker    Packs/day: 0.25    Years: 1.00    Pack years: 0.25    Types: Cigarettes    Quit date: 03/13/1998    Years since quitting: 21.6  . Smokeless tobacco: Never Used  Vaping Use  . Vaping Use: Never used  Substance and Sexual Activity  . Alcohol use: No  . Drug use: No  . Sexual activity: Not Currently    Birth control/protection: I.U.D.    Comment: Mirena   Other Topics Concern  . Not on file  Social History Narrative          Social Determinants of Health   Financial Resource Strain: Low Risk   . Difficulty of Paying Living Expenses: Not hard at all  Food Insecurity: No Food Insecurity  . Worried About Charity fundraiser in the Last Year: Never true  . Ran Out of Food in the Last Year: Never true  Transportation Needs: No Transportation Needs  . Lack of Transportation (Medical): No  . Lack of Transportation (Non-Medical): No  Physical Activity: Inactive  . Days of Exercise per Week: 0 days  . Minutes of Exercise per Session: 0 min  Stress: Stress Concern Present  . Feeling of Stress : Very much  Social Connections: Unknown  . Frequency of Communication with Friends and Family: More than three times a week  . Frequency of Social Gatherings with Friends and Family: More than three times a week  . Attends Religious Services: Patient refused  . Active Member of Clubs or Organizations: Patient refused  . Attends Archivist Meetings: Patient refused  . Marital Status: Married  Human resources officer Violence: Not At Risk  . Fear of Current or Ex-Partner: No  .  Emotionally Abused: No  . Physically Abused: No  . Sexually Abused: No   Review of Systems Some diarrhea Some right flank pain--intermittent Not sleeping "at all"    Objective:   Physical Exam Constitutional:      General: She is not in acute distress. Cardiovascular:     Rate and Rhythm: Normal rate and regular rhythm.     Heart sounds: No murmur heard.  No gallop.   Pulmonary:     Effort: Pulmonary effort is normal.     Breath  sounds: Normal breath sounds. No wheezing or rales.  Abdominal:     Palpations: Abdomen is soft.     Tenderness: There is no abdominal tenderness.  Musculoskeletal:     Cervical back: Neck supple.     Right lower leg: No edema.     Left lower leg: No edema.  Lymphadenopathy:     Cervical: No cervical adenopathy.  Neurological:     Mental Status: She is alert.  Psychiatric:     Comments: Depressed and dejected            Assessment & Plan:

## 2019-11-08 NOTE — Assessment & Plan Note (Signed)
Sugars still high Not clear if the metformin could be causing some of the side effects--but she clearly seemed to tolerate lower dose Will decrease to 500mg  bid for now Add glipizide XL 5mg 

## 2019-11-08 NOTE — Telephone Encounter (Signed)
I will see her as planned at 1:30PM

## 2019-11-08 NOTE — Assessment & Plan Note (Signed)
Not really hypertensive--but could have hypercortisol state Need to consider pheochromocytoma also

## 2019-11-11 LAB — ACTH: C206 ACTH: 11 pg/mL (ref 6–50)

## 2019-11-12 ENCOUNTER — Ambulatory Visit (INDEPENDENT_AMBULATORY_CARE_PROVIDER_SITE_OTHER): Payer: Medicaid Other | Admitting: Internal Medicine

## 2019-11-12 ENCOUNTER — Encounter: Payer: Self-pay | Admitting: Internal Medicine

## 2019-11-12 ENCOUNTER — Other Ambulatory Visit: Payer: Self-pay

## 2019-11-12 DIAGNOSIS — E119 Type 2 diabetes mellitus without complications: Secondary | ICD-10-CM | POA: Diagnosis not present

## 2019-11-12 DIAGNOSIS — F331 Major depressive disorder, recurrent, moderate: Secondary | ICD-10-CM

## 2019-11-12 LAB — ANDROSTENEDIONE: Androstenedione: 28 ng/dL

## 2019-11-12 LAB — T3: T3, Total: 76 ng/dL (ref 76–181)

## 2019-11-12 MED ORDER — DAPAGLIFLOZIN PROPANEDIOL 5 MG PO TABS: 5.0000 mg | ORAL_TABLET | Freq: Every day | ORAL | 11 refills | Status: DC

## 2019-11-12 MED ORDER — SERTRALINE HCL 100 MG PO TABS: 100.0000 mg | ORAL_TABLET | Freq: Every day | ORAL | 3 refills | Status: DC

## 2019-11-12 NOTE — Progress Notes (Signed)
Subjective:    Patient ID: Anita Massey, female    DOB: 1980-12-11, 39 y.o.   MRN: [id number]  HPI Here with mom for follow up This visit occurred during the SARS-CoV-2 public health emergency.  Safety protocols were in place, including screening questions prior to the visit, additional usage of staff PPE, and extensive cleaning of exam room while observing appropriate contact time as indicated for disinfecting solutions.   Did stop the metformin Tingling and GI symptoms are pretty much gone Still not eating  Sugars still over 200 even with glipizide bid  Current Outpatient Medications on File Prior to Visit  Medication Sig Dispense Refill  . ALPRAZolam (XANAX) 0.25 MG tablet Take 1 tablet (0.25 mg total) by mouth 2 (two) times daily as needed for anxiety. 30 tablet 0  . amLODipine (NORVASC) 10 MG tablet Take 1 tablet (10 mg total) by mouth daily. 30 tablet 11  . blood glucose meter kit and supplies KIT Dispense based on patient and insurance preference. Use up to four times daily as directed. (FOR ICD-9 250.00, 250.01). 1 each 0  . glipiZIDE (GLUCOTROL XL) 5 MG 24 hr tablet Take 1 tablet (5 mg total) by mouth daily with breakfast. (Patient taking differently: Take 5 mg by mouth in the morning and at bedtime. ) 30 tablet 1  . hydrocortisone 2.5 % cream Apply topically 3 (three) times daily as needed. 28 g 3  . hydrOXYzine (ATARAX/VISTARIL) 25 MG tablet Take 1 tablet (25 mg total) by mouth 3 (three) times daily as needed for anxiety. 30 tablet 0  . ondansetron (ZOFRAN ODT) 4 MG disintegrating tablet Take 1 tablet (4 mg total) by mouth every 8 (eight) hours as needed for nausea or vomiting. 20 tablet 0  . sertraline (ZOLOFT) 50 MG tablet Take 1 tablet (50 mg total) by mouth daily. 30 tablet 3   No current facility-administered medications on file prior to visit.    Allergies  Allergen Reactions  . Biaxin [Clarithromycin] Hives  . Metformin And Related Nausea And Vomiting    Also  bad tingling  . Toradol [Ketorolac Tromethamine]     Chest pain   . Prednisone Anxiety and Palpitations    "Makes me feel crazy"  . Morphine And Related     Chest Pain   . Tape Rash    Paper tape is fine    Past Medical History:  Diagnosis Date  . Anemia   . Anxiety state, unspecified   . Blood dyscrasia    PROTHROMBIN G 62952 HETEROZYGOSITY (FACTOR 2)  . Clotting disorder ([place])   . Diabetes mellitus without complication ([place])   . GERD (gastroesophageal reflux disease)    OCC  . Homozygous for MTHFR gene mutation   . Hypertension   . Hypothyroidism   . Multiple joint pain 09/04/2015  . Obesity   . Rosacea   . Undiagnosed cardiac murmurs   . Unspecified essential hypertension     Past Surgical History:  Procedure Laterality Date  . APPENDECTOMY  2009   [facility]  . CESAREAN SECTION  2016  . CESAREAN SECTION N/A 11/15/2018   Procedure: REPEAT CESAREAN SECTION;  Surgeon: Homero Fellers, MD;  Location: [facility] ORS;  Service: Obstetrics;  Laterality: N/A;  Female born @ [birth year] Apgars: 8/9 Weight: 6lbs 10 ozs  . CHOLECYSTECTOMY N/A 03/20/2016   Procedure: LAPAROSCOPIC CHOLECYSTECTOMY;  Surgeon: Jules Husbands, MD;  Location: [facility] ORS;  Service: General;  Laterality: N/A;  . DILATION AND CURETTAGE OF UTERUS  Family History  Problem Relation Age of Onset  . Mitral valve prolapse Mother   . Arrhythmia Mother   . Hypertension Mother   . Diabetes Mother   . Hypertension Father   . Hyperlipidemia Father   . Heart disease Maternal Grandfather   . Lung cancer Maternal Grandfather   . Cancer Maternal Grandfather   . Diabetes Brother   . Heart disease Maternal Aunt   . Diabetes Maternal Aunt   . Depression Maternal Aunt   . Cancer Maternal Grandmother        ovarian melanoma    Social History   Socioeconomic History  . Marital status: Married    Spouse name: Harrell Gave  . Number of children: 3  . Years of education: Not on file  . Highest education level: Not on file   Occupational History  . Occupation: Homemaker  Tobacco Use  . Smoking status: Former Smoker    Packs/day: 0.25    Years: 1.00    Pack years: 0.25    Types: Cigarettes    Quit date: 03/13/1998    Years since quitting: 21.6  . Smokeless tobacco: Never Used  Vaping Use  . Vaping Use: Never used  Substance and Sexual Activity  . Alcohol use: No  . Drug use: No  . Sexual activity: Not Currently    Birth control/protection: I.U.D.    Comment: Mirena   Other Topics Concern  . Not on file  Social History Narrative          Social Determinants of Health   Financial Resource Strain: Low Risk   . Difficulty of Paying Living Expenses: Not hard at all  Food Insecurity: No Food Insecurity  . Worried About Charity fundraiser in the Last Year: Never true  . Ran Out of Food in the Last Year: Never true  Transportation Needs: No Transportation Needs  . Lack of Transportation (Medical): No  . Lack of Transportation (Non-Medical): No  Physical Activity: Inactive  . Days of Exercise per Week: 0 days  . Minutes of Exercise per Session: 0 min  Stress: Stress Concern Present  . Feeling of Stress : Very much  Social Connections: Unknown  . Frequency of Communication with Friends and Family: More than three times a week  . Frequency of Social Gatherings with Friends and Family: More than three times a week  . Attends Religious Services: Patient refused  . Active Member of Clubs or Organizations: Patient refused  . Attends Archivist Meetings: Patient refused  . Marital Status: Married  Human resources officer Violence: Not At Risk  . Fear of Current or Ex-Partner: No  . Emotionally Abused: No  . Physically Abused: No  . Sexually Abused: No   Review of Systems Has lost 6# more since last visit Drinking lots of fluids    Objective:   Physical Exam Constitutional:      Appearance: Normal appearance.  Neurological:     Mental Status: She is alert.  Psychiatric:     Comments:  More level mood now Still subdued with mild psychomotor retardation            Assessment & Plan:

## 2019-11-12 NOTE — Assessment & Plan Note (Signed)
Intolerant of the metformin--was causing GI symptoms and tingling Will continue glipizide and add farxiga (or other SGLT2 inhibitor)

## 2019-11-12 NOTE — Patient Instructions (Signed)
Please increase the sertraline to 100mg  daily---you can use 2 of the 50mg  tabs daily till they run out. Please continue the glipizide 5 mg twice a day and add the farxiga daily (let me know if you have any troubles filling the prescription or tolerating it).

## 2019-11-12 NOTE — Assessment & Plan Note (Signed)
Mom feels that depression is the major issue right now Will increase the sertraline to 100mg  now Recheck 3 weeks

## 2019-11-15 LAB — CATECHOLAMINES, FRACTIONATED, PLASMA
Dopamine: <10 pg/mL
Epinephrine: 79 pg/mL
Norepinephrine: 407 pg/mL
Total Catecholamines: 486 pg/mL

## 2019-11-17 ENCOUNTER — Ambulatory Visit: Payer: Medicaid Other | Admitting: Internal Medicine

## 2019-11-19 DIAGNOSIS — E119 Type 2 diabetes mellitus without complications: Secondary | ICD-10-CM | POA: Diagnosis not present

## 2019-11-22 DIAGNOSIS — H5213 Myopia, bilateral: Secondary | ICD-10-CM | POA: Diagnosis not present

## 2019-11-26 ENCOUNTER — Encounter: Payer: Medicaid Other | Attending: Internal Medicine | Admitting: *Deleted

## 2019-11-26 ENCOUNTER — Encounter: Payer: Self-pay | Admitting: *Deleted

## 2019-11-26 ENCOUNTER — Other Ambulatory Visit: Payer: Self-pay

## 2019-11-26 VITALS — BP 126/84 | Ht 62.0 in | Wt 161.9 lb

## 2019-11-26 DIAGNOSIS — E119 Type 2 diabetes mellitus without complications: Secondary | ICD-10-CM | POA: Insufficient documentation

## 2019-11-26 DIAGNOSIS — E1165 Type 2 diabetes mellitus with hyperglycemia: Secondary | ICD-10-CM

## 2019-11-26 DIAGNOSIS — Z7984 Long term (current) use of oral hypoglycemic drugs: Secondary | ICD-10-CM | POA: Diagnosis not present

## 2019-11-26 NOTE — Patient Instructions (Signed)
Check blood sugars 2 x day before breakfast and 2 hrs after supper or other meal every day Bring blood sugar records to the next MD appointment  Exercise: Begin walking for  10-15  minutes 3  days a week and gradually  Increase to 30 minutes 5 x week  Eat 3 meals day, 1-2 snacks a day Space meals 4-6 hours apart Avoid sugar sweetened drinks (soda, tea, juices)  Call back if you want to schedule Diabetes classes or an appointment with the nurse or dietitian

## 2019-11-26 NOTE — Progress Notes (Signed)
Diabetes Self-Management Education  Visit Type: First/Initial  Appt. Start Time: 1030 Appt. End Time: 1130  11/26/2019  [name], identified by name and date of birth, is a 39 y.o. female with a diagnosis of Diabetes: Type 2.   ASSESSMENT  Blood pressure 126/84, height 5\' 2"  (1.575 m), weight 161 lb 14.4 oz (73.4 kg), currently breastfeeding. Body mass index is 29.61 kg/m.   Diabetes Self-Management Education - 11/26/19 1535      Visit Information   Visit Type First/Initial      Initial Visit   Diabetes Type Type 2    Are you currently following a meal plan? Yes    What type of meal plan do you follow? "cut out fried foods, soda, pastas"    Are you taking your medications as prescribed? Yes    Date Diagnosed "a few weeks ago"      Health Coping   How would you rate your overall health? Fair      Psychosocial Assessment   Patient Belief/Attitude about Diabetes Other (comment)   "sad somewhat"   Self-care barriers Lack of childcare   Self-management support Doctor's office;Family    Patient Concerns Nutrition/Meal planning;Medication;Monitoring;Healthy Lifestyle;Problem Solving;Glycemic Control;Weight Control    Special Needs None    Preferred Learning Style Auditory    Learning Readiness Change in progress    How often do you need to have someone help you when you read instructions, pamphlets, or other written materials from your doctor or pharmacy? 1 - Never    What is the last grade level you completed in school? some college      Pre-Education Assessment   Patient understands the diabetes disease and treatment process. Needs Instruction    Patient understands incorporating nutritional management into lifestyle. Needs Review    Patient undertands incorporating physical activity into lifestyle. Needs Instruction    Patient understands using medications safely. Needs Instruction    Patient understands monitoring blood glucose, interpreting and using results  Needs Review    Patient understands prevention, detection, and treatment of acute complications. Needs Instruction    Patient understands prevention, detection, and treatment of chronic complications. Needs Instruction    Patient understands how to develop strategies to address psychosocial issues. Needs Instruction    Patient understands how to develop strategies to promote health/change behavior. Needs Instruction      Complications   Last HgB A1C per patient/outside source --   Pt in ED for BG of 500's but no A1C was drawn.   How often do you check your blood sugar? 1-2 times/day    Fasting Blood glucose range (mg/dL) 130-179   Pt reports FBG's 140-150's mg/dL.   Postprandial Blood glucose range (mg/dL) >200   Pt reports pp's 200's mg/dL and sometimes over 300's mg/dL.   Have you had a dilated eye exam in the past 12 months? Yes    Have you had a dental exam in the past 12 months? Yes    Are you checking your feet? No      Dietary Intake   Breakfast cereal and milk    Snack (morning) 0-1 snacks/day - peanuts, chips, fruit (tropical fruit cups, pears, peaches, pineapple)    Lunch salad with grilled chicken and 1/2 baked potato; grilled chicken quesadilla with peppers and onions    Dinner chicken, beef, fish, beans, corn, peas, occasional rice and pasta; lettuce tomatoes, cuccumbers, carrots, broccoli, green beans    Beverage(s) water, fruit juice, occasional soda  Exercise   Exercise Type ADL's      Patient Education   Previous Diabetes Education No    Disease state  Definition of diabetes, type 1 and 2, and the diagnosis of diabetes;Factors that contribute to the development of diabetes    Nutrition management  Role of diet in the treatment of diabetes and the relationship between the three main macronutrients and blood glucose level;Food label reading, portion sizes and measuring food.;Carbohydrate counting;Reviewed blood glucose goals for pre and post meals and how to evaluate  the patients' food intake on their blood glucose level.    Physical activity and exercise  Role of exercise on diabetes management, blood pressure control and cardiac health.    Medications Reviewed patients medication for diabetes, action, purpose, timing of dose and side effects.    Monitoring Purpose and frequency of SMBG.;Taught/discussed recording of test results and interpretation of SMBG.;Identified appropriate SMBG and/or A1C goals.    Chronic complications Relationship between chronic complications and blood glucose control    Psychosocial adjustment Identified and addressed patients feelings and concerns about diabetes      Individualized Goals (developed by patient)   Reducing Risk Other (comment)   improve blood sugars, decrease medications, prevent diabetes complications, lose weight, lead a healthier lifestyle     Outcomes   Expected Outcomes Demonstrated interest in learning. Expect positive outcomes    Program Status Not Completed           Individualized Plan for Diabetes Self-Management Training:   Learning Objective:  Patient will have a greater understanding of diabetes self-management. Patient education plan is to attend individual and/or group sessions per assessed needs and concerns.   Plan:   Patient Instructions  Check blood sugars 2 x day before breakfast and 2 hrs after supper or other meal every day Bring blood sugar records to the next MD appointment Exercise: Begin walking for  10-15  minutes 3  days a week and gradually  Increase to 30 minutes 5 x week Eat 3 meals day, 1-2 snacks a day Space meals 4-6 hours apart Avoid sugar sweetened drinks (soda, tea, juices) Call back if you want to schedule Diabetes classes or an appointment with the nurse or dietitian  Expected Outcomes:  Demonstrated interest in learning. Expect positive outcomes  Education material provided:  General Meal Planning Guidelines Simple Meal Plan  If problems or questions,  patient to contact team via:  Johny Drilling, RN, [place], [place] [phone]  Future DSME appointment:  Patient reports that she doesn't have anyone to keep her 3 children so she can't return for Diabetes classes or an additional appointment with the nurse or dietitian.

## 2019-12-01 ENCOUNTER — Other Ambulatory Visit: Payer: Self-pay

## 2019-12-01 ENCOUNTER — Ambulatory Visit (INDEPENDENT_AMBULATORY_CARE_PROVIDER_SITE_OTHER): Payer: Medicaid Other | Admitting: Internal Medicine

## 2019-12-01 ENCOUNTER — Encounter: Payer: Self-pay | Admitting: Internal Medicine

## 2019-12-01 DIAGNOSIS — E039 Hypothyroidism, unspecified: Secondary | ICD-10-CM | POA: Diagnosis not present

## 2019-12-01 DIAGNOSIS — O99280 Endocrine, nutritional and metabolic diseases complicating pregnancy, unspecified trimester: Secondary | ICD-10-CM

## 2019-12-01 DIAGNOSIS — E1159 Type 2 diabetes mellitus with other circulatory complications: Secondary | ICD-10-CM | POA: Diagnosis not present

## 2019-12-01 DIAGNOSIS — L719 Rosacea, unspecified: Secondary | ICD-10-CM

## 2019-12-01 DIAGNOSIS — F331 Major depressive disorder, recurrent, moderate: Secondary | ICD-10-CM

## 2019-12-01 DIAGNOSIS — R0989 Other specified symptoms and signs involving the circulatory and respiratory systems: Secondary | ICD-10-CM | POA: Diagnosis not present

## 2019-12-01 MED ORDER — KETOCONAZOLE 2 % EX CREA: 1.0000 "application " | TOPICAL_CREAM | Freq: Two times a day (BID) | CUTANEOUS | 5 refills | Status: AC

## 2019-12-01 MED ORDER — DAPAGLIFLOZIN PROPANEDIOL 10 MG PO TABS: 10.0000 mg | ORAL_TABLET | Freq: Every day | ORAL | 3 refills | Status: DC

## 2019-12-01 MED ORDER — DOXYCYCLINE HYCLATE 100 MG PO CAPS
100.0000 mg | ORAL_CAPSULE | Freq: Every day | ORAL | 11 refills | Status: DC
Start: 2019-12-01 — End: 2021-03-15

## 2019-12-01 NOTE — Progress Notes (Signed)
Subjective:    Patient ID: Anita Massey, female    DOB: 1980-08-25, 39 y.o.   MRN: [id number]  HPI Here for follow up of depression and diabetes This visit occurred during the SARS-CoV-2 public health emergency.  Safety protocols were in place, including screening questions prior to the visit, additional usage of staff PPE, and extensive cleaning of exam room while observing appropriate contact time as indicated for disinfecting solutions.   Doing much better "mentally" Depression is gone Sleeping much better--but still not back to normal  Sugar this morning 211 but generally 140-175 Only occasionally checking in evening--had one over 300 Clearly better though No problems with the medication Hands feel shaky  Current Outpatient Medications on File Prior to Visit  Medication Sig Dispense Refill  . ALPRAZolam (XANAX) 0.25 MG tablet Take 1 tablet (0.25 mg total) by mouth 2 (two) times daily as needed for anxiety. 30 tablet 0  . amLODipine (NORVASC) 10 MG tablet Take 1 tablet (10 mg total) by mouth daily. 30 tablet 11  . blood glucose meter kit and supplies KIT Dispense based on patient and insurance preference. Use up to four times daily as directed. (FOR ICD-9 250.00, 250.01). 1 each 0  . dapagliflozin propanediol (FARXIGA) 5 MG TABS tablet Take 1 tablet (5 mg total) by mouth daily. 30 tablet 11  . doxycycline (VIBRAMYCIN) 100 MG capsule Take 100 mg by mouth daily.    Marland Kitchen glipiZIDE (GLUCOTROL XL) 5 MG 24 hr tablet Take 1 tablet (5 mg total) by mouth daily with breakfast. (Patient taking differently: Take 5 mg by mouth in the morning and at bedtime. ) 30 tablet 1  . hydrOXYzine (ATARAX/VISTARIL) 25 MG tablet Take 1 tablet (25 mg total) by mouth 3 (three) times daily as needed for anxiety. 30 tablet 0  . ketoconazole (NIZORAL) 2 % cream Apply 1 application topically in the morning and at bedtime.    . ondansetron (ZOFRAN ODT) 4 MG disintegrating tablet Take 1 tablet (4 mg total) by mouth  every 8 (eight) hours as needed for nausea or vomiting. 20 tablet 0  . sertraline (ZOLOFT) 100 MG tablet Take 1 tablet (100 mg total) by mouth daily. 90 tablet 3   No current facility-administered medications on file prior to visit.    Allergies  Allergen Reactions  . Biaxin [Clarithromycin] Hives  . Metformin And Related Nausea And Vomiting    Also bad tingling  . Toradol [Ketorolac Tromethamine]     Chest pain   . Prednisone Anxiety and Palpitations    "Makes me feel crazy"  . Morphine And Related     Chest Pain   . Tape Rash    Paper tape is fine    Past Medical History:  Diagnosis Date  . Anemia   . Anxiety state, unspecified   . 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 Clotting disorder ([place])   . Diabetes mellitus without complication ([place])   . GERD (gastroesophageal reflux disease)    OCC  . Homozygous for MTHFR gene mutation   . Hypertension   . Hypothyroidism   . Multiple joint pain 09/04/2015  . Obesity   . Rosacea   . Undiagnosed cardiac murmurs   . Unspecified essential hypertension     Past Surgical History:  Procedure Laterality Date  . APPENDECTOMY  2009   [facility]  . CESAREAN SECTION  2016  . CESAREAN SECTION N/A 11/15/2018   Procedure: REPEAT CESAREAN SECTION;  Surgeon:  Homero Fellers, MD;  Location: [facility] ORS;  Service: Obstetrics;  Laterality: N/A;  Female born @ [birth year] Apgars: 8/9 Weight: 6lbs 10 ozs  . CHOLECYSTECTOMY N/A 03/20/2016   Procedure: LAPAROSCOPIC CHOLECYSTECTOMY;  Surgeon: Jules Husbands, MD;  Location: [facility] ORS;  Service: General;  Laterality: N/A;  . DILATION AND CURETTAGE OF UTERUS      Family History  Problem Relation Age of Onset  . Mitral valve prolapse Mother   . Arrhythmia Mother   . Hypertension Mother   . Diabetes Mother   . Hypertension Father   . Hyperlipidemia Father   . Heart disease Maternal Grandfather   . Lung cancer Maternal Grandfather   . Cancer Maternal Grandfather   . Diabetes  Brother   . Heart disease Maternal Aunt   . Diabetes Maternal Aunt   . Depression Maternal Aunt   . Cancer Maternal Grandmother        ovarian melanoma  . Diabetes Maternal Grandmother   . Diabetes Maternal Uncle     Social History   Socioeconomic History  . Marital status: Married    Spouse name: Harrell Gave  . Number of children: 3  . Years of education: Not on file  . Highest education level: Not on file  Occupational History  . Occupation: Homemaker  Tobacco Use  . Smoking status: Former Smoker    Packs/day: 0.25    Years: 1.00    Pack years: 0.25    Types: Cigarettes    Quit date: 03/13/1998    Years since quitting: 21.7  . Smokeless tobacco: Never Used  Vaping Use  . Vaping Use: Never used  Substance and Sexual Activity  . Alcohol use: No  . Drug use: No  . Sexual activity: Not Currently    Birth control/protection: I.U.D.    Comment: Mirena   Other Topics Concern  . Not on file  Social History Narrative          Social Determinants of Health   Financial Resource Strain:   . Difficulty of Paying Living Expenses: Not on file  Food Insecurity:   . Worried About Charity fundraiser in the Last Year: Not on file  . Ran Out of Food in the Last Year: Not on file  Transportation Needs:   . Lack of Transportation (Medical): Not on file  . Lack of Transportation (Non-Medical): Not on file  Physical Activity:   . Days of Exercise per Week: Not on file  . Minutes of Exercise per Session: Not on file  Stress:   . Feeling of Stress : Not on file  Social Connections:   . Frequency of Communication with Friends and Family: Not on file  . Frequency of Social Gatherings with Friends and Family: Not on file  . Attends Religious Services: Not on file  . Active Member of Clubs or Organizations: Not on file  . Attends Archivist Meetings: Not on file  . Marital Status: Not on file  Intimate Partner Violence:   . Fear of Current or Ex-Partner: Not on file    . Emotionally Abused: Not on file  . Physically Abused: Not on file  . Sexually Abused: Not on file   Review of Systems Eating is better Weight has stabilized She did go for 1:1 diabetic counseling but won't be able to do the classes Continues on the doxycycline daily for her acne    Objective:   Physical Exam Constitutional:      Appearance: Normal appearance.  Neurological:     Mental Status: She is alert.  Psychiatric:        Mood and Affect: Mood normal.        Behavior: Behavior normal.     Comments: Mood is back to normal Looks good and normal interaction            Assessment & Plan:

## 2019-12-01 NOTE — Assessment & Plan Note (Signed)
BP Readings from Last 3 Encounters:  12/01/19 112/80  11/26/19 126/84  11/12/19 122/80   Good control on the amlodipine

## 2019-12-01 NOTE — Assessment & Plan Note (Signed)
Some fatigue off the levothyroxine but labs fine Will recheck at next visit

## 2019-12-01 NOTE — Assessment & Plan Note (Signed)
Will renew the doxycycline and topical Rx  Other doctor is out of practice

## 2019-12-01 NOTE — Assessment & Plan Note (Signed)
Sugars still high on the glipizide 5 bid and farxiga Will increase the farxiga to 10mg  daily

## 2019-12-01 NOTE — Assessment & Plan Note (Signed)
Now seems to be approaching remission Still feels fatigued though Discussed tachyphylaxis and the possible need to increase the dose if she worsens

## 2019-12-06 ENCOUNTER — Other Ambulatory Visit: Payer: Self-pay | Admitting: Internal Medicine

## 2019-12-15 ENCOUNTER — Ambulatory Visit: Payer: Medicaid Other | Admitting: Internal Medicine

## 2019-12-30 ENCOUNTER — Telehealth: Payer: Self-pay | Admitting: Internal Medicine

## 2019-12-30 NOTE — Telephone Encounter (Signed)
Patient called in stating that she has only two strips left for her diabetic meter and needs a refill asap. EM

## 2019-12-31 MED ORDER — ACCU-CHEK SOFTCLIX LANCETS MISC: 12 refills | Status: DC

## 2019-12-31 MED ORDER — ACCU-CHEK GUIDE VI STRP: ORAL_STRIP | 12 refills | Status: DC

## 2019-12-31 NOTE — Telephone Encounter (Signed)
We just checked her thyroid and it was normal If the sugars stay up, we will need to consider another medication----she should come back before the March follow up (if consistently over 150-160 fasting)

## 2019-12-31 NOTE — Addendum Note (Signed)
Addended by: Pilar Grammes on: 12/31/2019 08:55 AM   Modules accepted: Orders

## 2019-12-31 NOTE — Telephone Encounter (Addendum)
Pt says she thinks she was still on thyroid medication at the time of her last labs. I looked back and it was just stopped prior to the labs. Made an appt for 01-06-20.

## 2019-12-31 NOTE — Telephone Encounter (Signed)
Please refill for a year  

## 2019-12-31 NOTE — Telephone Encounter (Addendum)
What system is she using? Called pt to find out. Accu-chek Guide Me and soft-clix lancets. I have sent those in.  She said she is worried that her thyroid may be off. Her hair has been falling out and she is more tired. Asking if thyroid labs should be done.  Also, her blood sugar is 200-300 fasting. Asking what she should do.

## 2020-01-05 ENCOUNTER — Encounter: Payer: Self-pay | Admitting: Internal Medicine

## 2020-01-05 ENCOUNTER — Ambulatory Visit (INDEPENDENT_AMBULATORY_CARE_PROVIDER_SITE_OTHER): Payer: Medicaid Other | Admitting: Internal Medicine

## 2020-01-05 ENCOUNTER — Other Ambulatory Visit: Payer: Self-pay

## 2020-01-05 VITALS — BP 110/72 | HR 97 | Temp 97.8°F | Ht 62.0 in | Wt 156.0 lb

## 2020-01-05 DIAGNOSIS — E1159 Type 2 diabetes mellitus with other circulatory complications: Secondary | ICD-10-CM

## 2020-01-05 DIAGNOSIS — F331 Major depressive disorder, recurrent, moderate: Secondary | ICD-10-CM | POA: Diagnosis not present

## 2020-01-05 DIAGNOSIS — E039 Hypothyroidism, unspecified: Secondary | ICD-10-CM

## 2020-01-05 LAB — T4, FREE: Free T4: 0.76 ng/dL (ref 0.60–1.60)

## 2020-01-05 LAB — TSH: TSH: 0.89 u[IU]/mL (ref 0.35–4.50)

## 2020-01-05 NOTE — Patient Instructions (Signed)
PLease check your sugars daily or most days fasting in the morning. Let me know if they are consistently over 180. You can check other times to see your response to certain foods or situations.

## 2020-01-05 NOTE — Assessment & Plan Note (Signed)
Initially associated with pregnancy Unclear if still euthyroid now off levothyroxine Will check labs again today

## 2020-01-05 NOTE — Assessment & Plan Note (Signed)
On farxiga and bid glipizide Next step is lantus or liraglutide/semaglutide She will check fasting sugars and let me know if they stay over 180

## 2020-01-05 NOTE — Assessment & Plan Note (Signed)
Now doing better with the sertraline Stress with life now--but mostly for good things

## 2020-01-05 NOTE — Progress Notes (Signed)
Subjective:    Patient ID: Anita Massey, female    DOB: Aug 12, 1980, 39 y.o.   MRN: [id number]  HPI Here due to ongoing concerns about her thyroid This visit occurred during the SARS-CoV-2 public health emergency.  Safety protocols were in place, including screening questions prior to the visit, additional usage of staff PPE, and extensive cleaning of exam room while observing appropriate contact time as indicated for disinfecting solutions.   Moving into a new house Hasn't done Christmas shopping "It is chaos"  Concerned about thyroid being underactive Last testing was soon after stopping Feels tired all the time--losing hair Some "funny feelings"--brief  Has lost a few more pounds--close to pre-pregnancy weight  Depression is better  Sugars are "all over the place" Takes the meds every day--glipizide bid and farxiga 128 this AM 173 last night As high as 305 in afternoon  Current Outpatient Medications on File Prior to Visit  Medication Sig Dispense Refill  . Accu-Chek Softclix Lancets lancets Use to check blood sugar twice a day 100 each 12  . ALPRAZolam (XANAX) 0.25 MG tablet Take 1 tablet (0.25 mg total) by mouth 2 (two) times daily as needed for anxiety. 30 tablet 0  . amLODipine (NORVASC) 10 MG tablet Take 1 tablet (10 mg total) by mouth daily. 30 tablet 11  . blood glucose meter kit and supplies KIT Dispense based on patient and insurance preference. Use up to four times daily as directed. (FOR ICD-9 250.00, 250.01). 1 each 0  . dapagliflozin propanediol (FARXIGA) 10 MG TABS tablet Take 1 tablet (10 mg total) by mouth daily. 90 tablet 3  . doxycycline (VIBRAMYCIN) 100 MG capsule Take 1 capsule (100 mg total) by mouth daily. 30 capsule 11  . glipiZIDE (GLUCOTROL XL) 5 MG 24 hr tablet Take 1 tablet (5 mg total) by mouth in the morning and at bedtime. 60 tablet 11  . glucose blood (ACCU-CHEK GUIDE) test strip Use to check blood sugar twice a day 100 each 12  .  hydrOXYzine (ATARAX/VISTARIL) 25 MG tablet Take 1 tablet (25 mg total) by mouth 3 (three) times daily as needed for anxiety. 30 tablet 0  . ketoconazole (NIZORAL) 2 % cream Apply 1 application topically in the morning and at bedtime. 60 g 5  . ondansetron (ZOFRAN ODT) 4 MG disintegrating tablet Take 1 tablet (4 mg total) by mouth every 8 (eight) hours as needed for nausea or vomiting. 20 tablet 0  . sertraline (ZOLOFT) 100 MG tablet Take 1 tablet (100 mg total) by mouth daily. 90 tablet 3   No current facility-administered medications on file prior to visit.    Allergies  Allergen Reactions  . Biaxin [Clarithromycin] Hives  . Metformin And Related Nausea And Vomiting    Also bad tingling  . Toradol [Ketorolac Tromethamine]     Chest pain   . Prednisone Anxiety and Palpitations    "Makes me feel crazy"  . Morphine And Related     Chest Pain   . Tape Rash    Paper tape is fine    Past Medical History:  Diagnosis Date  . Anemia   . Anxiety state, unspecified   . Blood dyscrasia    PROTHROMBIN G 78295 HETEROZYGOSITY (FACTOR 2)  . Clotting disorder ([place])   . Diabetes mellitus without complication ([place])   . GERD (gastroesophageal reflux disease)    OCC  . Homozygous for MTHFR gene mutation   . Hypertension   . Hypothyroidism   . Multiple  joint pain 09/04/2015  . Obesity   . Rosacea   . Undiagnosed cardiac murmurs   . Unspecified essential hypertension     Past Surgical History:  Procedure Laterality Date  . APPENDECTOMY  2009   [facility]  . CESAREAN SECTION  2016  . CESAREAN SECTION N/A 11/15/2018   Procedure: REPEAT CESAREAN SECTION;  Surgeon: Homero Fellers, MD;  Location: [facility] ORS;  Service: Obstetrics;  Laterality: N/A;  Female born @ 19 Apgars: 8/9 Weight: 6lbs 10 ozs  . CHOLECYSTECTOMY N/A 03/20/2016   Procedure: LAPAROSCOPIC CHOLECYSTECTOMY;  Surgeon: Jules Husbands, MD;  Location: [facility] ORS;  Service: General;  Laterality: N/A;  . DILATION AND CURETTAGE OF UTERUS       Family History  Problem Relation Age of Onset  . Mitral valve prolapse Mother   . Arrhythmia Mother   . Hypertension Mother   . Diabetes Mother   . Hypertension Father   . Hyperlipidemia Father   . Heart disease Maternal Grandfather   . Lung cancer Maternal Grandfather   . Cancer Maternal Grandfather   . Diabetes Brother   . Heart disease Maternal Aunt   . Diabetes Maternal Aunt   . Depression Maternal Aunt   . Cancer Maternal Grandmother        ovarian melanoma  . Diabetes Maternal Grandmother   . Diabetes Maternal Uncle     Social History   Socioeconomic History  . Marital status: Married    Spouse name: Harrell Gave  . Number of children: 3  . Years of education: Not on file  . Highest education level: Not on file  Occupational History  . Occupation: Homemaker  Tobacco Use  . Smoking status: Former Smoker    Packs/day: 0.25    Years: 1.00    Pack years: 0.25    Types: Cigarettes    Quit date: 03/13/1998    Years since quitting: 21.8  . Smokeless tobacco: Never Used  Vaping Use  . Vaping Use: Never used  Substance and Sexual Activity  . Alcohol use: No  . Drug use: No  . Sexual activity: Not Currently    Birth control/protection: I.U.D.    Comment: Mirena   Other Topics Concern  . 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 Very brief No SOB Sleep is variable---so much stress right now    Objective:   Physical Exam Constitutional:      Appearance: Normal appearance.  Neck:     Comments: No thyroid enlargement Cardiovascular:     Rate and Rhythm: Normal rate and regular rhythm.      Heart sounds: No murmur heard. No gallop.   Pulmonary:     Effort: Pulmonary effort is normal.     Breath sounds: Normal breath sounds. No wheezing or rales.  Abdominal:     Palpations: Abdomen is soft.     Tenderness: There is no abdominal tenderness.  Musculoskeletal:     Cervical back: Neck supple.  Lymphadenopathy:     Cervical: No cervical adenopathy.  Neurological:     Mental Status: She is  alert.  Psychiatric:        Mood and Affect: Mood normal.        Behavior: Behavior normal.            Assessment & Plan:

## 2020-02-05 IMAGING — CT CT RENAL STONE PROTOCOL
2 of 4 series · 15 of 46 positions shown, 17 images · non-contrast
Comparison: 03/23/2016

CLINICAL DATA: Severe dysuria. Right-sided pelvic pain for 1 month.
Microscopic hematuria. 6 weeks postpartum. Previous appendectomy and
cholecystectomy.

EXAM:
CT ABDOMEN AND PELVIS WITHOUT CONTRAST
TECHNIQUE: Multidetector CT imaging of the abdomen and pelvis was performed
following the standard protocol without IV contrast.

[Series 2: renal stone 5.00 · axial · 0.67mm/px · z∈[-1550,-1140]mm · 12 of 90 slices shown, 14 images]
[im 4/90  soft-tissue]
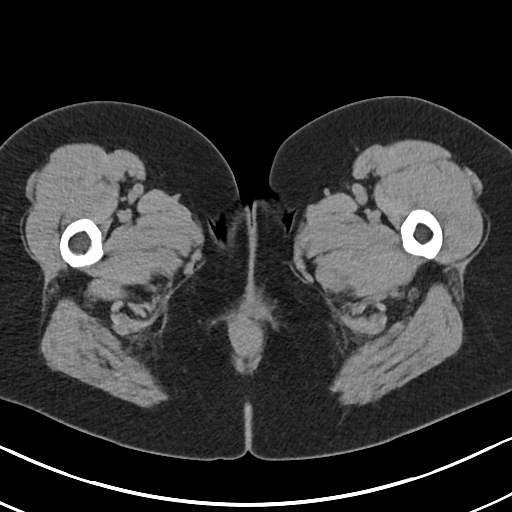
[im 4/90  bone]
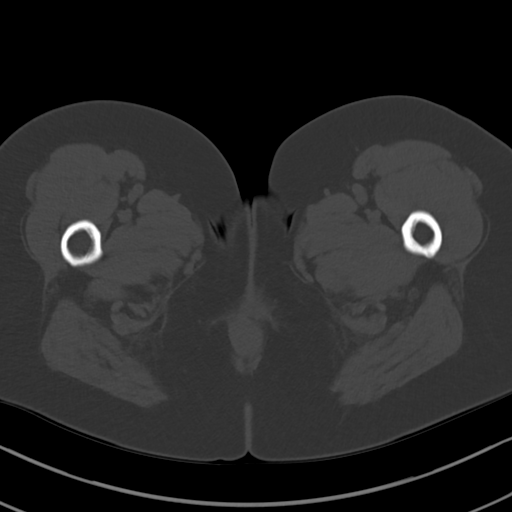
[im 12/90  soft-tissue]
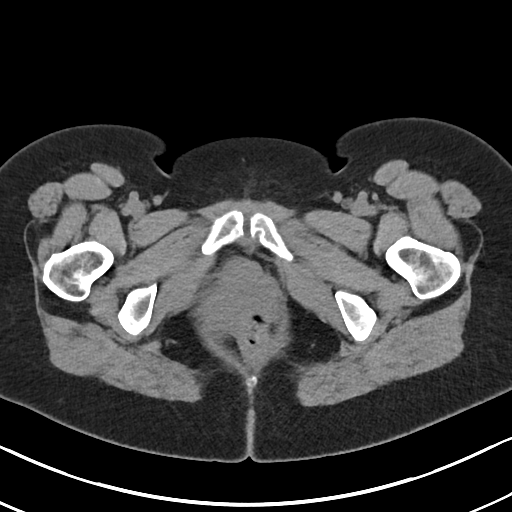
[im 19/90  soft-tissue]
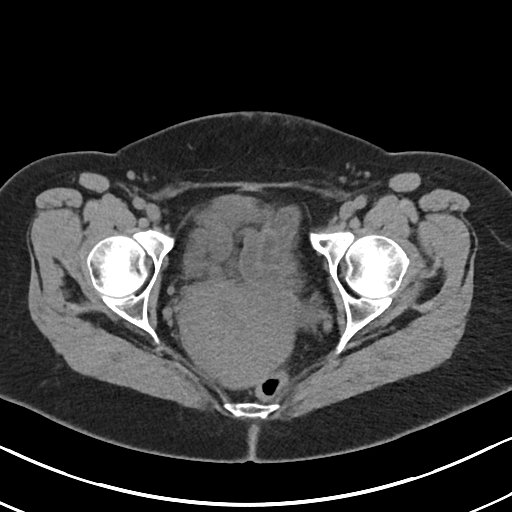
[im 26/90  soft-tissue]
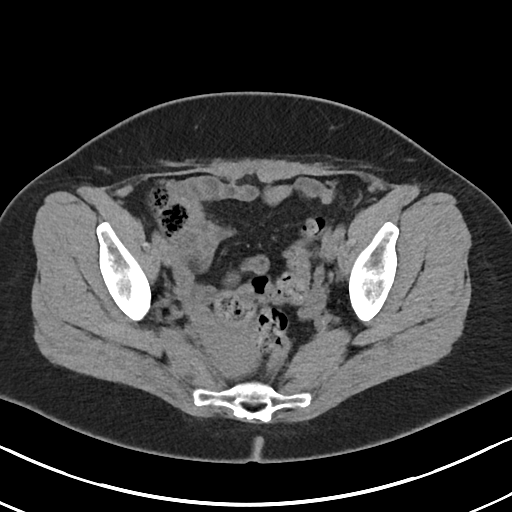
[im 34/90  soft-tissue]
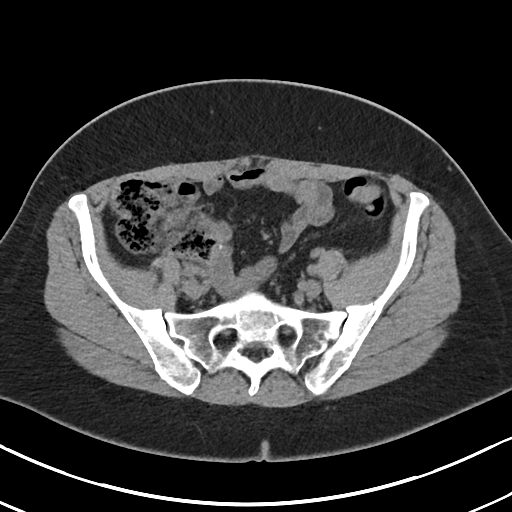
[im 41/90  soft-tissue]
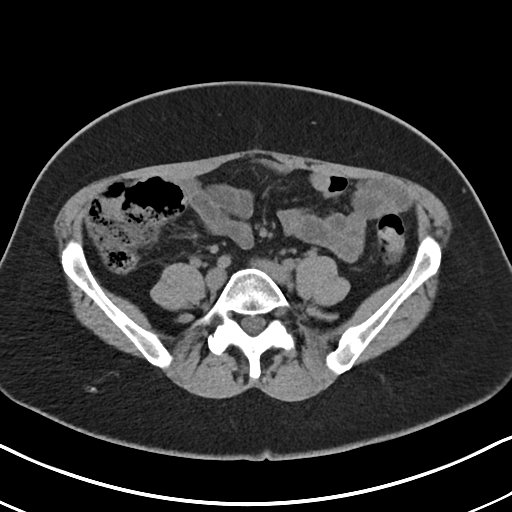
[im 49/90  soft-tissue]
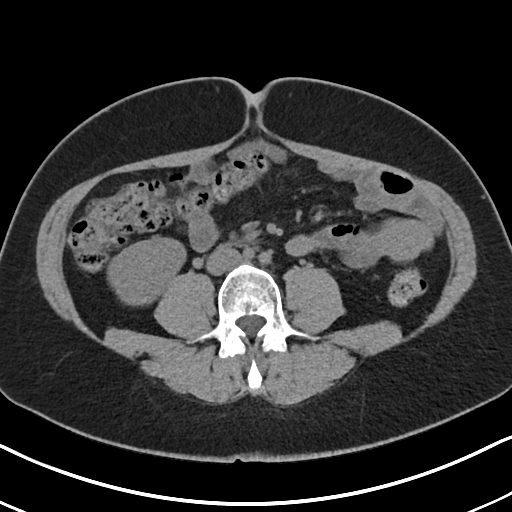
[im 56/90  soft-tissue]
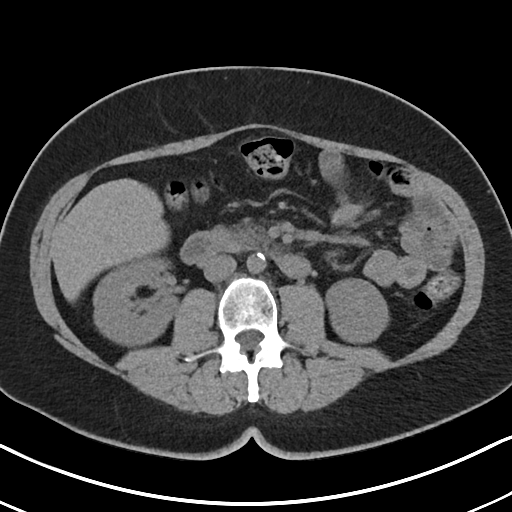
[im 64/90  soft-tissue]
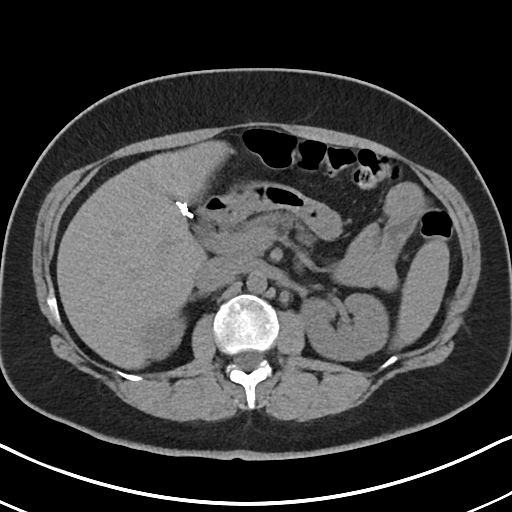
[im 64/90  bone]
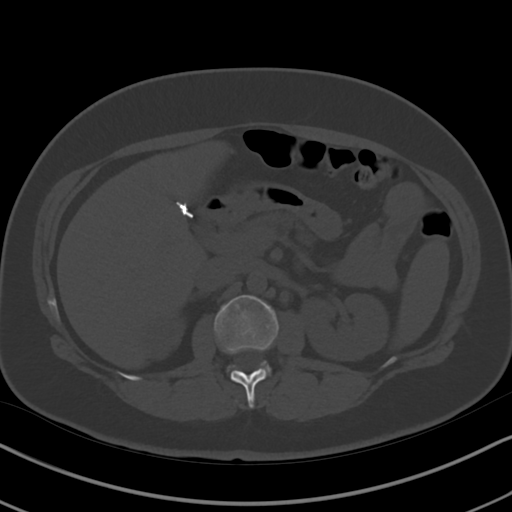
[im 71/90  soft-tissue]
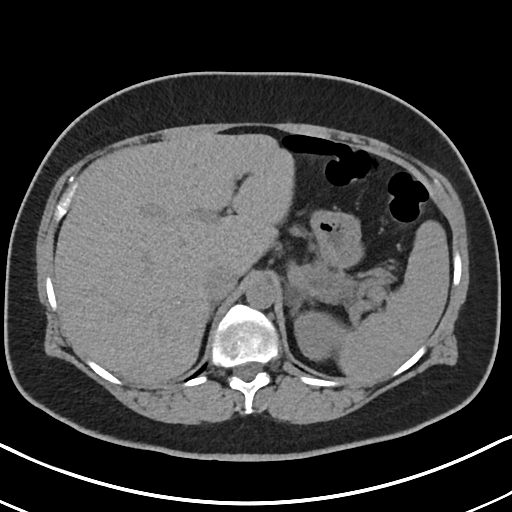
[im 78/90  soft-tissue]
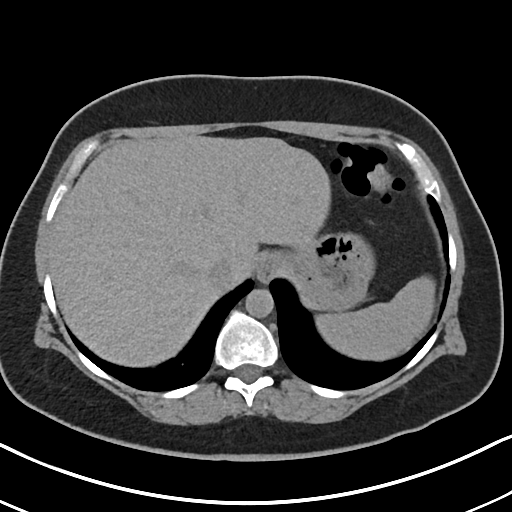
[im 86/90  soft-tissue]
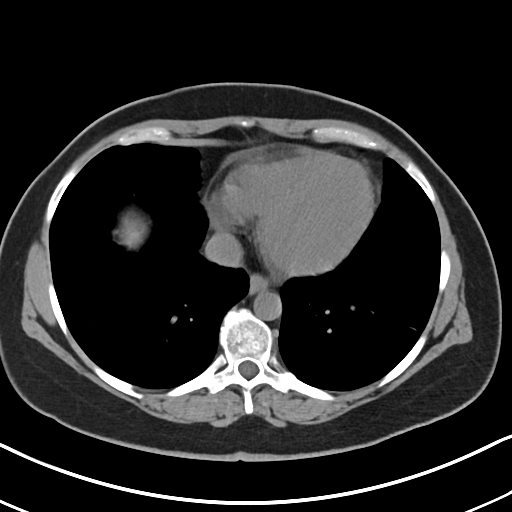

[Series 4: renal stone 2.00 cor · coronal · 0.67mm/px · 3 of 135 slices shown]
[im 45/135  soft-tissue]
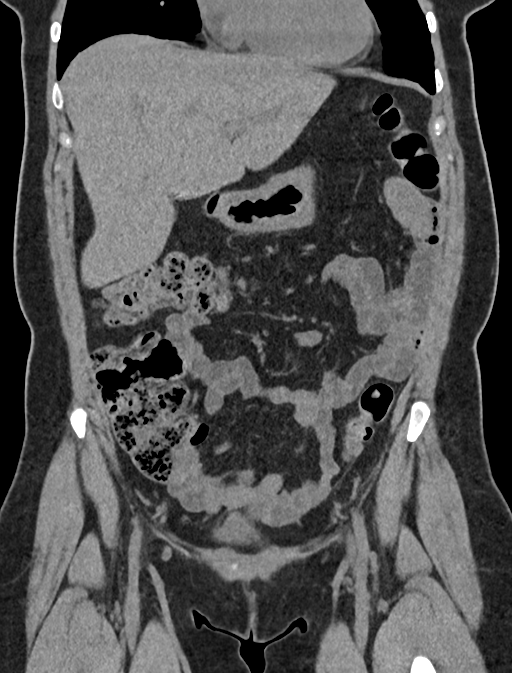
[im 60/135  soft-tissue]
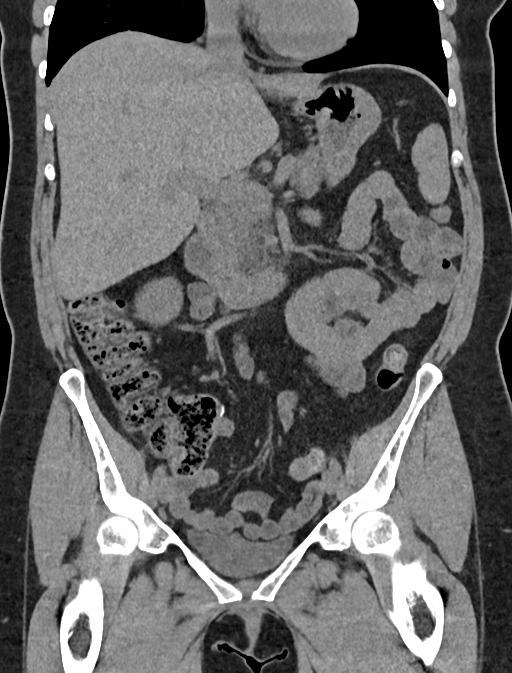
[im 75/135  soft-tissue]
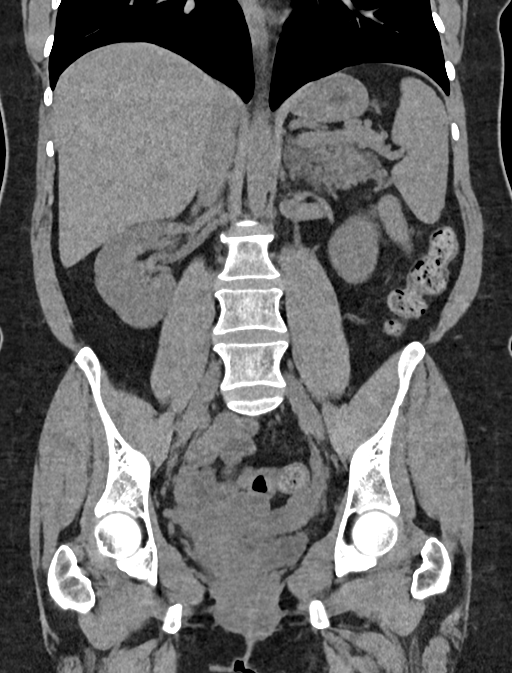

[15 of 46 positions shown; findings below may reference images not displayed]

FINDINGS: Lower chest: Multifocal ill-defined areas of airspace disease seen
in both lung bases, most likely infectious or inflammatory in
etiology.

Hepatobiliary: No mass visualized on this unenhanced exam. Prior
cholecystectomy. No evidence of biliary obstruction.

Pancreas: No mass or inflammatory process visualized on this
unenhanced exam.

Spleen:  Within normal limits in size.

Adrenals/Urinary tract: Stable 1.7 cm left adrenal mass, consistent
with benign adenoma. A sub-cm fat attenuation lesion is also seen in
the posterior midpole of the right kidney which is stable and
consistent with a tiny benign angiomyolipoma. No evidence of
urolithiasis or hydronephrosis. Unremarkable unopacified urinary
bladder.

Stomach/Bowel: No evidence of obstruction, inflammatory process, or
abnormal fluid collections.

Vascular/Lymphatic: No pathologically enlarged lymph nodes
identified. No evidence of abdominal aortic aneurysm.

Reproductive: Uterus is unremarkable. 3.0 cm benign-appearing left
ovarian cyst, most likely physiologic in a reproductive age female.
No inflammatory process or abnormal fluid collections identified.

Other:  None.

Musculoskeletal: No suspicious bone lesions identified. Chronic
bilateral sacroiliitis again noted.
IMPRESSION: 1. No evidence of urolithiasis, hydronephrosis, or other acute
abdominal or pelvic findings.
2. 3 cm benign-appearing left ovarian cyst, most likely physiologic
in a reproductive age female.
3. Stable benign left adrenal adenoma and tiny benign right renal
angiomyolipoma.
4. Multifocal ill-defined areas of airspace disease in both lung
bases, most likely infectious or inflammatory in etiology. Recommend
clinical correlation and consider chest radiograph for further
evaluation.

## 2020-03-08 ENCOUNTER — Telehealth: Payer: Self-pay | Admitting: Internal Medicine

## 2020-03-08 NOTE — Telephone Encounter (Signed)
I am sure she is talking about the [place]. We could try pt assistance. Will get with Dr Silvio Pate to see if he wants to change it.

## 2020-03-08 NOTE — Telephone Encounter (Signed)
Pt called in her insurance was cancelled for some reason and she has diabetes medication that she is needing and without insurance its 600$ and wanted to know any alternatives

## 2020-03-09 MED ORDER — PIOGLITAZONE HCL 30 MG PO TABS: 30.0000 mg | ORAL_TABLET | Freq: Every day | ORAL | 11 refills | Status: DC

## 2020-03-09 NOTE — Telephone Encounter (Signed)
Anita Massey was not a new Rx----but it must be that. [name] can't afford that, next option would be liraglutide (if that is affordable)----otherwise actos

## 2020-03-09 NOTE — Telephone Encounter (Signed)
Go ahead and send 30mg   #90 x 3 Have her watch for swelling in the legs--would be most common side effect

## 2020-03-09 NOTE — Telephone Encounter (Signed)
Victoza is way too expensive. Actos is $15 a month at [facility]. She would like to try that.

## 2020-03-09 NOTE — Telephone Encounter (Signed)
Spoke to pt. Sent rx to [facility].

## 2020-03-15 ENCOUNTER — Other Ambulatory Visit: Payer: Self-pay

## 2020-03-15 ENCOUNTER — Telehealth: Payer: Self-pay

## 2020-03-15 NOTE — Telephone Encounter (Signed)
Patient called and stated that her blood sugars have been running in the high 200's in the mornings fasting and in the 400's in the evening. She is taking glipizide XL BID and was switched from farxiga 10 mg tabs to actos 30 mg tabs QD on 03/09/20. Patient states her BS yesterday morning was 293 and in the evening was in the 400s. States her sugars have been a lot higher since starting the Actos and she has also had bad headaches since then that are pretty constant. Patient also states that her aunt passed away over the weekend and thinks they may not be helping her sugars. Patient wants to know what she can do differently? Medications were changed since he lost her insurance and the farxiga was too expensive.

## 2020-03-15 NOTE — Telephone Encounter (Signed)
Tried to call pt. No answer and no VM 

## 2020-03-15 NOTE — Telephone Encounter (Signed)
Other than patient assistance for the farxiga--I am not sure. The actos may take a while to work better. Next step is adding relatively inexpensive once or twice a day insulin

## 2020-03-16 NOTE — Telephone Encounter (Addendum)
Spoke to pt. She is working on her insurance. She will let me know if we need to do the pt asst paperwork.

## 2020-03-23 NOTE — Telephone Encounter (Signed)
Pt called back and said she was able to get farxiga thru her ins co. Pt has not restarted farxiga because she just picked it up today. Pt said this morning she took the actos 30 mg and glipizide XL 5 mg. Pt wants to know when she should restart her Farxiga 10 mg taking one daily and does she continue to take the glipizide xl 5 mg bid and actos 30 mg one daily. I asked pt what her FBS was this morning. Pt said she has not checked her sugar today. I asked what her last FBS was and when she took the FBS last. Pt said she is not sure when last FBS was taken.  On 03/17/20 BS 302;         03/18/20 BS 353         03/19/20 BS 414          03/20/20 no BS taken         03/21/20 no BS Taken          03/22/20 at 8PM BS 403 (pt not sure if had taken evening glipizide or not but pt thinks she had eaten supper.          03/23/20 at 3:45 PM BS 500  pt said she takes BS at different times. Pt said she is not having any symptoms of BS being so high. Pt said she has low grade fever of 101; H/A on and off, S/T, head congestion,runny nose since 03/20/20. Pt is not taking tylenol. Pt does not have cough, SOB or chest pain and no diarrhea or vomiting. Pt said she knows she does not have covid. pts children are sick and her son was cked for covid last wk and was neg. Pt said she is not worried about her cold symptoms but she is worried about her sugars being high for couple of wks. advised pt with her symptoms and high blood sugars should go to UC or ED to be eval and see if needs some in insulin to get BS down. Pt said her husband is at work out of town; her kids are sick so no one can watch them and she may have to wait to go when her husband gets home at 8 PM. Pt cannot take her children with her to UC. Will send note to DR Silvio Pate who is out of office and Avie Echevaria NP who is in office. Pt wants to know what else she can do.

## 2020-03-23 NOTE — Telephone Encounter (Signed)
Pt is asking if she goes to the ER tonight and they get her numbers down, what does she do tomorrow if it goes back up. Dr Silvio Pate had mentioned insulin if we could not get [place] approved and the Actos did not work. She just got the [place] approved but has not bought it. She said even with the [place] her numbers were still up but not as much as now.  I advised pt she will need to go somewhere tonight and then wait for a response tomorrow from [facility] or Dr Silvio Pate about the next step.

## 2020-03-23 NOTE — Telephone Encounter (Signed)
Recommend UC/ER eval with sugars that high.

## 2020-03-23 NOTE — Telephone Encounter (Signed)
The most that would probably happen at ER is IV fluids-----she may be able to orally rehydrate (like with pedialyte). She should take farxiga, actos and glipizide---but if she gets low sugars, she should cut the glipizide back to 5mg  once a day and then keep [place] in the loop about where her sugars are

## 2020-03-23 NOTE — Telephone Encounter (Signed)
Pt notified and voiced understanding. Pt said she spoke with her husband and he will be home in approx 3 hrs and pt will go to ED at that time. If pt starts feeling worse she will go to ED sooner. FYI to Avie Echevaria NP and Dr Silvio Pate.

## 2020-03-24 NOTE — Telephone Encounter (Signed)
Pt called and last night by drinking a lot of water pts BS went to 300. 03/24/20 FBS 223 pt took farxiga and glipizide after taking BS. Pt notified as instructed by Dr Everardo Beals note on 03/23/20. Pt voiced understanding and will cb on 03/27/20 with update. Sending note to [facility] CMA as FYI.

## 2020-03-27 NOTE — Telephone Encounter (Signed)
Tried to call pt. No answer and no VM. Will try again later.

## 2020-03-28 ENCOUNTER — Encounter: Payer: Self-pay | Admitting: Internal Medicine

## 2020-03-28 ENCOUNTER — Ambulatory Visit (INDEPENDENT_AMBULATORY_CARE_PROVIDER_SITE_OTHER): Payer: Medicaid Other | Admitting: Internal Medicine

## 2020-03-28 ENCOUNTER — Other Ambulatory Visit: Payer: Self-pay

## 2020-03-28 VITALS — BP 100/70 | HR 73 | Temp 97.9°F | Ht 63.0 in | Wt 155.0 lb

## 2020-03-28 DIAGNOSIS — E1159 Type 2 diabetes mellitus with other circulatory complications: Secondary | ICD-10-CM

## 2020-03-28 DIAGNOSIS — F331 Major depressive disorder, recurrent, moderate: Secondary | ICD-10-CM | POA: Diagnosis not present

## 2020-03-28 DIAGNOSIS — I10 Essential (primary) hypertension: Secondary | ICD-10-CM | POA: Diagnosis not present

## 2020-03-28 DIAGNOSIS — Z0000 Encounter for general adult medical examination without abnormal findings: Secondary | ICD-10-CM

## 2020-03-28 LAB — COMPREHENSIVE METABOLIC PANEL
ALT: 14 U/L (ref 0–35)
AST: 14 U/L (ref 0–37)
Albumin: 4 g/dL (ref 3.5–5.2)
Alkaline Phosphatase: 136 U/L — ABNORMAL HIGH (ref 39–117)
BUN: 11 mg/dL (ref 6–23)
CO2: 27 mEq/L (ref 19–32)
Calcium: 9.6 mg/dL (ref 8.4–10.5)
Chloride: 105 mEq/L (ref 96–112)
Creatinine, Ser: 0.64 mg/dL (ref 0.40–1.20)
GFR: 111.07 mL/min (ref >60.00)
Glucose, Bld: 109 mg/dL — ABNORMAL HIGH (ref 70–99)
Potassium: 3.8 mEq/L (ref 3.5–5.1)
Sodium: 138 mEq/L (ref 135–145)
Total Bilirubin: 0.5 mg/dL (ref 0.2–1.2)
Total Protein: 7.2 g/dL (ref 6.0–8.3)

## 2020-03-28 LAB — LIPID PANEL
Cholesterol: 208 mg/dL — ABNORMAL HIGH (ref 0–200)
HDL: 34.9 mg/dL — ABNORMAL LOW (ref >39.00)
LDL Cholesterol: 148 mg/dL — ABNORMAL HIGH (ref 0–99)
NonHDL: 173.56
Total CHOL/HDL Ratio: 6
Triglycerides: 129 mg/dL (ref 0.0–149.0)
VLDL: 25.8 mg/dL (ref 0.0–40.0)

## 2020-03-28 LAB — CBC
HCT: 42.8 % (ref 36.0–46.0)
Hemoglobin: 14.6 g/dL (ref 12.0–15.0)
MCHC: 34.1 g/dL (ref 30.0–36.0)
MCV: 87.3 fl (ref 78.0–100.0)
Platelets: 293 10*3/uL (ref 150.0–400.0)
RBC: 4.91 Mil/uL (ref 3.87–5.11)
RDW: 13.5 % (ref 11.5–15.5)
WBC: 10.5 10*3/uL (ref 4.0–10.5)

## 2020-03-28 LAB — MICROALBUMIN / CREATININE URINE RATIO
Creatinine,U: 77.8 mg/dL
Microalb Creat Ratio: 11 mg/g (ref 0.0–30.0)
Microalb, Ur: 8.6 mg/dL — ABNORMAL HIGH (ref 0.0–1.9)

## 2020-03-28 LAB — HEMOGLOBIN A1C: Hgb A1c MFr Bld: 9.9 % — ABNORMAL HIGH (ref 4.6–6.5)

## 2020-03-28 NOTE — Progress Notes (Signed)
Subjective:    Patient ID: Anita Massey, female    DOB: 12/15/1980, 40 y.o.   MRN: [id number]  HPI Here for physical This visit occurred during the SARS-CoV-2 public health emergency.  Safety protocols were in place, including screening questions prior to the visit, additional usage of staff PPE, and extensive cleaning of exam room while observing appropriate contact time as indicated for disinfecting solutions.   Sugars were staying up even on the farxiga Then couldn't refill it--insurance (off for several weeks) Took actos for a while--not as effective Glipizide twice a day also Sugars running over 200 even just after restarting the farxiga Took a few actos then--but now off the actos now No foot numbness, tingling or pain  Mood has been okay No major depressed mood but "a lot going on" Sleep is variable--not great. Tosses and turns--not new Appetite is okay Weight has stabilized  Current Outpatient Medications on File Prior to Visit  Medication Sig Dispense Refill  . Accu-Chek Softclix Lancets lancets Use to check blood sugar twice a day 100 each 12  . ALPRAZolam (XANAX) 0.25 MG tablet Take 1 tablet (0.25 mg total) by mouth 2 (two) times daily as needed for anxiety. 30 tablet 0  . amLODipine (NORVASC) 10 MG tablet Take 1 tablet (10 mg total) by mouth daily. 30 tablet 11  . blood glucose meter kit and supplies KIT Dispense based on patient and insurance preference. Use up to four times daily as directed. (FOR ICD-9 250.00, 250.01). 1 each 0  . dapagliflozin propanediol (FARXIGA) 5 MG TABS tablet Take 5 mg by mouth daily.    Marland Kitchen doxycycline (VIBRAMYCIN) 100 MG capsule Take 1 capsule (100 mg total) by mouth daily. 30 capsule 11  . glipiZIDE (GLUCOTROL XL) 5 MG 24 hr tablet Take 1 tablet (5 mg total) by mouth in the morning and at bedtime. 60 tablet 11  . glucose blood (ACCU-CHEK GUIDE) test strip Use to check blood sugar twice a day 100 each 12  . hydrOXYzine (ATARAX/VISTARIL) 25  MG tablet Take 1 tablet (25 mg total) by mouth 3 (three) times daily as needed for anxiety. 30 tablet 0  . ketoconazole (NIZORAL) 2 % cream Apply 1 application topically in the morning and at bedtime. 60 g 5  . ondansetron (ZOFRAN ODT) 4 MG disintegrating tablet Take 1 tablet (4 mg total) by mouth every 8 (eight) hours as needed for nausea or vomiting. 20 tablet 0  . pioglitazone (ACTOS) 30 MG tablet Take 1 tablet (30 mg total) by mouth daily. 30 tablet 11  . sertraline (ZOLOFT) 100 MG tablet Take 1 tablet (100 mg total) by mouth daily. 90 tablet 3   No current facility-administered medications on file prior to visit.    Allergies  Allergen Reactions  . Biaxin [Clarithromycin] Hives  . Metformin And Related Nausea And Vomiting    Also bad tingling  . Toradol [Ketorolac Tromethamine]     Chest pain   . Prednisone Anxiety and Palpitations    "Makes me feel crazy"  . Morphine And Related     Chest Pain   . Tape Rash    Paper tape is fine    Past Medical History:  Diagnosis Date  . Anemia   . Anxiety state, unspecified   . 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 Clotting disorder ([place])   . Diabetes mellitus without complication ([place])   . GERD (gastroesophageal reflux disease)    OCC  .  Homozygous for MTHFR gene mutation   . Hypertension   . Hypothyroidism   . Multiple joint pain 09/04/2015  . Obesity   . Rosacea   . Undiagnosed cardiac murmurs   . Unspecified essential hypertension     Past Surgical History:  Procedure Laterality Date  . APPENDECTOMY  2009   [facility]  . CESAREAN SECTION  2016  . CESAREAN SECTION N/A 11/15/2018   Procedure: REPEAT CESAREAN SECTION;  Surgeon: Homero Fellers, MD;  Location: [facility] ORS;  Service: Obstetrics;  Laterality: N/A;  Female born @ [birth year] Apgars: 8/9 Weight: 6lbs 10 ozs  . CHOLECYSTECTOMY N/A 03/20/2016   Procedure: LAPAROSCOPIC CHOLECYSTECTOMY;  Surgeon: Jules Husbands, MD;  Location: [facility] ORS;  Service:  General;  Laterality: N/A;  . DILATION AND CURETTAGE OF UTERUS      Family History  Problem Relation Age of Onset  . Mitral valve prolapse Mother   . Arrhythmia Mother   . Hypertension Mother   . Diabetes Mother   . Hypertension Father   . Hyperlipidemia Father   . Heart disease Maternal Grandfather   . Lung cancer Maternal Grandfather   . Cancer Maternal Grandfather   . Diabetes Brother   . Heart disease Maternal Aunt   . Diabetes Maternal Aunt   . Depression Maternal Aunt   . Cancer Maternal Grandmother        ovarian melanoma  . Diabetes Maternal Grandmother   . Diabetes Maternal Uncle     Social History   Socioeconomic History  . Marital status: Married    Spouse name: Harrell Gave  . Number of children: 3  . Years of education: Not on file  . Highest education level: Not on file  Occupational History  . Occupation: Homemaker  Tobacco Use  . Smoking status: Former Smoker    Packs/day: 0.25    Years: 1.00    Pack years: 0.25    Types: Cigarettes    Quit date: 03/13/1998    Years since quitting: 22.0  . Smokeless tobacco: Never Used  Vaping Use  . Vaping Use: Never used  Substance and Sexual Activity  . Alcohol use: No  . Drug use: No  . Sexual activity: Not Currently    Birth control/protection: I.U.D.    Comment: Mirena   Other Topics Concern  . Not on file  Social History Narrative          Social Determinants of Health   Financial Resource Strain: Not on file  Food Insecurity: Not on file  Transportation Needs: Not on file  Physical Activity: Not on file  Stress: Not on file  Social Connections: Not on file  Intimate Partner Violence: Not on file   Review of Systems  Constitutional: Positive for fatigue. Negative for unexpected weight change.       Wears seat belt  HENT: Negative for dental problem, hearing loss and tinnitus.   Eyes:       No diplopia or unilateral vision loss Blurry vision at times  Respiratory: Negative for cough, chest  tightness and shortness of breath.   Cardiovascular: Negative for chest pain, palpitations and leg swelling.  Gastrointestinal: Positive for diarrhea. Negative for blood in stool.       No heartburn  Endocrine: Positive for polydipsia and polyuria.  Genitourinary: Positive for urgency. Negative for dysuria and hematuria.       Has IUD--no regular periods  Musculoskeletal: Positive for back pain. Negative for arthralgias and joint swelling.  Skin:  Still losing a lot of hair On doxycycline for her inflammatory rash---gives her diarrhea. Not taking regularly now  Allergic/Immunologic: Positive for environmental allergies. Negative for immunocompromised state.       Allegra in the past  Neurological: Positive for headaches. Negative for dizziness, syncope and light-headedness.  Hematological: Negative for adenopathy. Does not bruise/bleed easily.  Psychiatric/Behavioral: Positive for sleep disturbance. The patient is not nervous/anxious.        Objective:   Physical Exam Constitutional:      Appearance: Normal appearance.  HENT:     Right Ear: Tympanic membrane, ear canal and external ear normal.     Left Ear: Tympanic membrane, ear canal and external ear normal.     Mouth/Throat:     Pharynx: No oropharyngeal exudate or posterior oropharyngeal erythema.  Eyes:     Conjunctiva/sclera: Conjunctivae normal.     Pupils: Pupils are equal, round, and reactive to light.  Cardiovascular:     Rate and Rhythm: Normal rate and regular rhythm.     Pulses: Normal pulses.     Heart sounds: No murmur heard. No gallop.   Pulmonary:     Effort: Pulmonary effort is normal.     Breath sounds: Normal breath sounds. No wheezing or rales.  Abdominal:     Palpations: Abdomen is soft.     Tenderness: There is no abdominal tenderness.  Musculoskeletal:     Cervical back: Neck supple.     Right lower leg: No edema.     Left lower leg: No edema.  Lymphadenopathy:     Cervical: No cervical  adenopathy.  Skin:    Comments: Same inflammatory facial rash No foot lesions  Neurological:     General: No focal deficit present.     Mental Status: She is alert and oriented to person, place, and time.     Comments: Normal sensation in feet  Psychiatric:        Mood and Affect: Mood normal.        Behavior: Behavior normal.            Assessment & Plan:

## 2020-03-28 NOTE — Assessment & Plan Note (Signed)
Very labile Back on the farxiga and glipizide Will increase farxiga if very high---to 10mg  Consider restarting actos Next step---insulin vs endocrine evaluation

## 2020-03-28 NOTE — Assessment & Plan Note (Signed)
Healthy Trying to start exercise--will walk now with better weather Pap not due yet---has IUD  Prefers no vaccines

## 2020-03-28 NOTE — Assessment & Plan Note (Signed)
Mild symptoms persist---but much better Will continue the sertraline

## 2020-03-28 NOTE — Telephone Encounter (Signed)
Pt in office today for CPE

## 2020-03-28 NOTE — Assessment & Plan Note (Signed)
BP Readings from Last 3 Encounters:  03/28/20 100/70  01/05/20 110/72  12/01/19 112/80   Okay on amlodipine

## 2020-03-30 ENCOUNTER — Other Ambulatory Visit: Payer: Self-pay

## 2020-03-30 MED ORDER — PIOGLITAZONE HCL 15 MG PO TABS: 15.0000 mg | ORAL_TABLET | Freq: Every day | ORAL | 11 refills | Status: DC

## 2020-03-30 MED ORDER — DAPAGLIFLOZIN PROPANEDIOL 10 MG PO TABS: 10.0000 mg | ORAL_TABLET | Freq: Every day | ORAL | 11 refills | Status: DC

## 2020-06-09 ENCOUNTER — Telehealth: Payer: Self-pay

## 2020-06-09 NOTE — Telephone Encounter (Signed)
Form filled out for [employer] and faxed to number provided on sheet.

## 2020-07-04 ENCOUNTER — Ambulatory Visit: Payer: Medicaid Other | Admitting: Internal Medicine

## 2020-07-21 ENCOUNTER — Ambulatory Visit: Payer: Medicaid Other | Admitting: Internal Medicine

## 2020-08-28 ENCOUNTER — Ambulatory Visit (INDEPENDENT_AMBULATORY_CARE_PROVIDER_SITE_OTHER): Payer: Medicaid Other | Admitting: Internal Medicine

## 2020-08-28 ENCOUNTER — Encounter: Payer: Self-pay | Admitting: Internal Medicine

## 2020-08-28 ENCOUNTER — Other Ambulatory Visit: Payer: Self-pay

## 2020-08-28 VITALS — BP 118/82 | HR 88 | Temp 98.0°F | Ht 63.0 in | Wt 156.0 lb

## 2020-08-28 DIAGNOSIS — R5383 Other fatigue: Secondary | ICD-10-CM

## 2020-08-28 DIAGNOSIS — F331 Major depressive disorder, recurrent, moderate: Secondary | ICD-10-CM

## 2020-08-28 DIAGNOSIS — E1159 Type 2 diabetes mellitus with other circulatory complications: Secondary | ICD-10-CM | POA: Diagnosis not present

## 2020-08-28 LAB — POCT GLYCOSYLATED HEMOGLOBIN (HGB A1C): Hemoglobin A1C: 9.3 % — AB (ref 4.0–5.6)

## 2020-08-28 MED ORDER — SEMAGLUTIDE(0.25 OR 0.5MG/DOS) 2 MG/1.5ML ~~LOC~~ SOPN: 0.2500 mg | PEN_INJECTOR | SUBCUTANEOUS | 5 refills | Status: DC

## 2020-08-28 MED ORDER — FLUCONAZOLE 150 MG PO TABS: 150.0000 mg | ORAL_TABLET | ORAL | 1 refills | Status: DC

## 2020-08-28 NOTE — Assessment & Plan Note (Signed)
Lab Results  Component Value Date   HGBA1C 9.3 (A) 08/28/2020   Better but still not acceptable Doesn't tolerate the pioglitazone--so will stop it Try weekly semaglutide

## 2020-08-28 NOTE — Assessment & Plan Note (Signed)
Probably multifactorial Will recheck thyroid and other blood work

## 2020-08-28 NOTE — Assessment & Plan Note (Signed)
Still with stress but not really depressed Will continue the sertraline

## 2020-08-28 NOTE — Progress Notes (Signed)
Subjective:    Patient ID: Anita Massey, female    DOB: [birth year], 40 y.o.   MRN: [id number]  HPI Here due to fatigue This visit occurred during the SARS-CoV-2 public health emergency.  Safety protocols were in place, including screening questions prior to the visit, additional usage of staff PPE, and extensive cleaning of exam room while observing appropriate contact time as indicated for disinfecting solutions.   "I'm exhausted all the time" Wonders about her thyroid Not resting well  Has some "swimming" in her head recently Sugar 315 then ---before dinner Checking twice a day at times---like if she doesn't feel right  Taking actos only occasionally Gets bad headaches  Glipizide bid and farxiga  No chest pain No SOB  Stress with kids, finances, school Not really depressed now  Current Outpatient Medications on File Prior to Visit  Medication Sig Dispense Refill   Accu-Chek Softclix Lancets lancets Use to check blood sugar twice a day 100 each 12   ALPRAZolam (XANAX) 0.25 MG tablet Take 1 tablet (0.25 mg total) by mouth 2 (two) times daily as needed for anxiety. 30 tablet 0   amLODipine (NORVASC) 10 MG tablet Take 1 tablet (10 mg total) by mouth daily. 30 tablet 11   blood glucose meter kit and supplies KIT Dispense based on patient and insurance preference. Use up to four times daily as directed. (FOR ICD-9 250.00, 250.01). 1 each 0   dapagliflozin propanediol (FARXIGA) 10 MG TABS tablet Take 1 tablet (10 mg total) by mouth daily before breakfast. 30 tablet 11   doxycycline (VIBRAMYCIN) 100 MG capsule Take 1 capsule (100 mg total) by mouth daily. 30 capsule 11   glipiZIDE (GLUCOTROL XL) 5 MG 24 hr tablet Take 1 tablet (5 mg total) by mouth in the morning and at bedtime. 60 tablet 11   glucose blood (ACCU-CHEK GUIDE) test strip Use to check blood sugar twice a day 100 each 12   ketoconazole (NIZORAL) 2 % cream Apply 1 application topically in the morning and at bedtime. 60  g 5   ondansetron (ZOFRAN ODT) 4 MG disintegrating tablet Take 1 tablet (4 mg total) by mouth every 8 (eight) hours as needed for nausea or vomiting. 20 tablet 0   pioglitazone (ACTOS) 15 MG tablet Take 1 tablet (15 mg total) by mouth daily. 30 tablet 11   sertraline (ZOLOFT) 100 MG tablet Take 1 tablet (100 mg total) by mouth daily. 90 tablet 3   No current facility-administered medications on file prior to visit.    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)   Clotting disorder (HCC)    Diabetes mellitus without complication (HCC)    GERD (gastroesophageal reflux disease)    OCC   Homozygous for MTHFR gene mutation    Hypertension    Hypothyroidism    Multiple joint pain 09/04/2015   Obesity    Rosacea    Undiagnosed cardiac murmurs  Unspecified essential hypertension     Past Surgical History:  Procedure Laterality Date   APPENDECTOMY  2009   [facility]   CESAREAN SECTION  2016   CESAREAN SECTION N/A 11/15/2018   Procedure: REPEAT CESAREAN SECTION;  Surgeon: Homero Fellers, MD;  Location: [facility] ORS;  Service: Obstetrics;  Laterality: N/A;  Female born @ 50 Apgars: 8/9 Weight: 6lbs 10 ozs   CHOLECYSTECTOMY N/A 03/20/2016   Procedure: LAPAROSCOPIC CHOLECYSTECTOMY;  Surgeon: Jules Husbands, MD;  Location: [facility] ORS;  Service: General;  Laterality: N/A;   DILATION AND CURETTAGE OF UTERUS      Family History  Problem Relation Age of Onset   Mitral valve prolapse Mother    Arrhythmia Mother    Hypertension Mother    Diabetes Mother    Hypertension  Father    Hyperlipidemia Father    Heart disease Maternal Grandfather    Lung cancer Maternal Grandfather    Cancer Maternal Grandfather    Diabetes Brother    Heart disease Maternal Aunt    Diabetes Maternal Aunt    Depression Maternal Aunt    Cancer Maternal Grandmother        ovarian melanoma   Diabetes Maternal Grandmother    Diabetes Maternal Uncle     Social History   Socioeconomic History   Marital status: Married    Spouse name: Harrell Gave   Number of children: 3   Years of education: Not on file   Highest education level: Not on file  Occupational History   Occupation: Homemaker  Tobacco Use   Smoking status: Former    Packs/day: 0.25    Years: 1.00    Pack years: 0.25    Types: Cigarettes    Quit date: 03/13/1998    Years since quitting: 22.4   Smokeless tobacco: Never  Vaping Use   Vaping Use: Never used  Substance and Sexual Activity   Alcohol use: No   Drug use: No   Sexual activity: Not Currently    Birth control/protection: I.U.D.    Comment: Mirena   Other Topics Concern   Not on file  Social History Narrative          Social Determinants of Health   Financial Resource Strain: Not on file  Food Insecurity: Not on file  Transportation Needs: Not on file  Physical Activity: Not on file  Stress: Not on file  Social Connections: Not on file  Intimate Partner Violence: Not on file   Review of Systems Appetite is okay Weight is stable Some urinary symptoms---will give fluconazole     Objective:   Physical Exam Constitutional:      Appearance: Normal appearance.  Cardiovascular:     Rate and Rhythm: Normal rate and regular rhythm.     Heart sounds: No murmur heard.   No gallop.  Pulmonary:     Effort: Pulmonary effort is normal.     Breath sounds: Normal breath sounds.  Abdominal:     Palpations: Abdomen is soft.     Tenderness: There is no abdominal tenderness.  Musculoskeletal:     Cervical back: Neck supple.     Right lower  leg: No edema.     Left lower leg: No edema.  Lymphadenopathy:     Cervical: No cervical adenopathy.  Neurological:     Mental Status: She is alert.  Psychiatric:        Mood and Affect: Mood normal.        Behavior: Behavior normal.  Assessment & Plan:

## 2020-08-29 ENCOUNTER — Other Ambulatory Visit: Payer: Self-pay | Admitting: Internal Medicine

## 2020-08-29 LAB — COMPREHENSIVE METABOLIC PANEL
ALT: 43 U/L — ABNORMAL HIGH (ref 0–35)
AST: 23 U/L (ref 0–37)
Albumin: 4.5 g/dL (ref 3.5–5.2)
Alkaline Phosphatase: 149 U/L — ABNORMAL HIGH (ref 39–117)
BUN: 15 mg/dL (ref 6–23)
CO2: 27 mEq/L (ref 19–32)
Calcium: 10.9 mg/dL — ABNORMAL HIGH (ref 8.4–10.5)
Chloride: 101 mEq/L (ref 96–112)
Creatinine, Ser: 0.68 mg/dL (ref 0.40–1.20)
GFR: 109.13 mL/min (ref >60.00)
Glucose, Bld: 164 mg/dL — ABNORMAL HIGH (ref 70–99)
Potassium: 4.2 mEq/L (ref 3.5–5.1)
Sodium: 138 mEq/L (ref 135–145)
Total Bilirubin: 0.5 mg/dL (ref 0.2–1.2)
Total Protein: 7.6 g/dL (ref 6.0–8.3)

## 2020-08-29 LAB — CBC
HCT: 47.8 % — ABNORMAL HIGH (ref 36.0–46.0)
Hemoglobin: 16.1 g/dL — ABNORMAL HIGH (ref 12.0–15.0)
MCHC: 33.7 g/dL (ref 30.0–36.0)
MCV: 89.3 fl (ref 78.0–100.0)
Platelets: 301 10*3/uL (ref 150.0–400.0)
RBC: 5.35 Mil/uL — ABNORMAL HIGH (ref 3.87–5.11)
RDW: 12.8 % (ref 11.5–15.5)
WBC: 11.9 10*3/uL — ABNORMAL HIGH (ref 4.0–10.5)

## 2020-08-29 LAB — SEDIMENTATION RATE: Sed Rate: 20 mm/hr (ref 0–20)

## 2020-08-29 LAB — TSH: TSH: 1.35 u[IU]/mL (ref 0.35–5.50)

## 2020-08-29 LAB — T4, FREE: Free T4: 0.82 ng/dL (ref 0.60–1.60)

## 2020-08-30 MED ORDER — DULAGLUTIDE 0.75 MG/0.5ML ~~LOC~~ SOAJ: 0.7500 mg | SUBCUTANEOUS | 3 refills | Status: DC

## 2020-08-30 NOTE — Telephone Encounter (Signed)
Pt called to follow up on mychart message

## 2020-08-31 NOTE — Telephone Encounter (Signed)
Spoke to pt. She said she is taking her Zoloft daily. Says she is not in a good frame of mind as she said yesterday. Does not think she would do harm to her self but does not feel like she is in a good place mentally. I got with Dr Silvio Pate and he said she can increase her Zoloft to '150mg'$  for now. I also gave her information on [facility].

## 2020-08-31 NOTE — Telephone Encounter (Signed)
Anita Massey called in and stated that she dulaglutide is not covered, and the insurance told her that there was no way to tell what is covered or not.  She stated her blood sugar is elevated, and she is depressed, and she just feels bad. And she called in from [phone]

## 2020-09-01 ENCOUNTER — Telehealth: Payer: Self-pay

## 2020-09-01 NOTE — Telephone Encounter (Signed)
[facility] Night - Client Nonclinical Telephone Record AccessNurse Client [place] Night - Client Client Site [place] Physician Viviana Simpler- MD Contact Type Call Who Is Calling Pharmacy Call Type Pharmacy Message Only Reason for Call Request for medication pre authorization Initial Comment Caller is from [place] . She is requesting a prior authorization for Trulicity for patient Anita Massey (DOB 20-Jul-1980) Contact Kristie C. Contact # [phone] Disp. Time Disposition Final User 08/31/2020 5:21:42 PM General Information Provided Yes Vinnie Level Call Closed By: Vinnie Level Transaction Date/Time: 08/31/2020 5:19:27 PM (ET)

## 2020-09-01 NOTE — Telephone Encounter (Signed)
PA was already done yesterday. This information is in another note.

## 2020-11-21 ENCOUNTER — Telehealth: Payer: Self-pay

## 2020-11-21 MED ORDER — GLIPIZIDE ER 5 MG PO TB24
5.0000 mg | ORAL_TABLET | Freq: Two times a day (BID) | ORAL | 11 refills | Status: DC
Start: 2020-11-21 — End: 2021-05-11

## 2020-11-21 NOTE — Telephone Encounter (Signed)
Rx sent electronically.  

## 2020-11-23 ENCOUNTER — Other Ambulatory Visit: Payer: Self-pay | Admitting: Internal Medicine

## 2020-11-29 ENCOUNTER — Ambulatory Visit: Payer: Medicaid Other | Admitting: Internal Medicine

## 2020-11-30 ENCOUNTER — Other Ambulatory Visit: Payer: Self-pay | Admitting: Internal Medicine

## 2021-01-20 ENCOUNTER — Other Ambulatory Visit: Payer: Self-pay | Admitting: Internal Medicine

## 2021-02-16 ENCOUNTER — Other Ambulatory Visit: Payer: Self-pay

## 2021-02-16 ENCOUNTER — Encounter: Payer: Self-pay | Admitting: Internal Medicine

## 2021-02-16 ENCOUNTER — Ambulatory Visit: Payer: Medicaid Other | Admitting: Internal Medicine

## 2021-02-16 DIAGNOSIS — K529 Noninfective gastroenteritis and colitis, unspecified: Secondary | ICD-10-CM | POA: Insufficient documentation

## 2021-02-16 DIAGNOSIS — E1159 Type 2 diabetes mellitus with other circulatory complications: Secondary | ICD-10-CM

## 2021-02-16 DIAGNOSIS — F331 Major depressive disorder, recurrent, moderate: Secondary | ICD-10-CM | POA: Diagnosis not present

## 2021-02-16 LAB — POCT GLYCOSYLATED HEMOGLOBIN (HGB A1C): Hemoglobin A1C: 9.7 % — AB (ref 4.0–5.6)

## 2021-02-16 MED ORDER — TRULICITY 1.5 MG/0.5ML ~~LOC~~ SOAJ: 1.5000 mg | SUBCUTANEOUS | 11 refills | Status: DC

## 2021-02-16 NOTE — Assessment & Plan Note (Signed)
Has stress with 3 children, etc Doesn't really seem exacerbated---though not a lot of energy On sertraline 100mg  daily

## 2021-02-16 NOTE — Assessment & Plan Note (Addendum)
Hopefully better with the trulicity 1.00 weekly. Would hold off on increasing dose anyway--given GI symptoms currently On farxiga 10/glipizide 5 bid  Lab Results  Component Value Date   HGBA1C 9.7 (A) 02/16/2021   Will go ahead and try 1.5mg  trulicity

## 2021-02-16 NOTE — Progress Notes (Signed)
° °Subjective:  ° ° Patient ID: Anita Massey, female    DOB: 10/03/1980, 41 y.o.   MRN: [id number] ° °HPI °Here due to diarrhea °With husband and son ° °Up 2 nights ago with stomach upset ad diarrhea °Bad "sulfur" burps °Then yesterday--took kids to school, then back to bed °Had incontinent diarrhea, chills and felt bad °COVID negative ° °Some stomach pain °Some nausea but no vomiting °Still able to drink and eat some °No blood in diarrhea ° °Feels a bit better today ° °Has been on been on trulicity for a while °Weight down slightly °Sugars still been running high--but not checking sugars fasting ° °Current Outpatient Medications on File Prior to Visit  °Medication Sig Dispense Refill  ° Accu-Chek Softclix Lancets lancets Use to check blood sugar twice a day 100 each 12  ° ALPRAZolam (XANAX) 0.25 MG tablet Take 1 tablet (0.25 mg total) by mouth 2 (two) times daily as needed for anxiety. 30 tablet 0  ° amLODipine (NORVASC) 10 MG tablet Take 1 tablet by mouth once daily 90 tablet 0  ° blood glucose meter kit and supplies KIT Dispense based on patient and insurance preference. Use up to four times daily as directed. (FOR ICD-9 250.00, 250.01). 1 each 0  ° dapagliflozin propanediol (FARXIGA) 10 MG TABS tablet Take 1 tablet (10 mg total) by mouth daily before breakfast. 30 tablet 11  ° doxycycline (VIBRAMYCIN) 100 MG capsule Take 1 capsule (100 mg total) by mouth daily. 30 capsule 11  ° fluconazole (DIFLUCAN) 150 MG tablet Take 1 tablet (150 mg total) by mouth once a week. 4 tablet 1  ° glipiZIDE (GLUCOTROL XL) 5 MG 24 hr tablet Take 1 tablet (5 mg total) by mouth in the morning and at bedtime. 60 tablet 11  ° glucose blood (ACCU-CHEK GUIDE) test strip Use to check blood sugar twice a day 100 each 12  ° ketoconazole (NIZORAL) 2 % cream Apply 1 application topically in the morning and at bedtime. 60 g 5  ° ondansetron (ZOFRAN ODT) 4 MG disintegrating tablet Take 1 tablet (4 mg total) by mouth every 8 (eight) hours as  needed for nausea or vomiting. 20 tablet 0  ° sertraline (ZOLOFT) 100 MG tablet Take 1 tablet by mouth once daily 30 tablet 11  ° TRULICITY 0.75 MG/0.5ML SOPN INJECT 0.75 MG  SUBCUTANEOUSLY ONCE A WEEK 4 mL 0  ° °No current facility-administered medications on file prior to visit.  ° ° °Allergies  °Allergen Reactions  ° Biaxin [Clarithromycin] Hives  ° Metformin And Related Nausea And Vomiting  °  Also bad tingling  ° Toradol [Ketorolac Tromethamine]   °  Chest pain   ° Prednisone Anxiety and Palpitations  °  "Makes me feel crazy"  ° Morphine And Related   °  Chest Pain   ° Tape Rash  °  Paper tape is fine  ° ° °Past Medical History:  °Diagnosis Date  ° Anemia   ° Anxiety state, unspecified   ° Blood dyscrasia   ° PROTHROMBIN G 20210 HETEROZYGOSITY (FACTOR 2)  ° Clotting disorder (HCC)   ° Diabetes mellitus without complication (HCC)   ° GERD (gastroesophageal reflux disease)   ° OCC  ° Homozygous for MTHFR gene mutation   ° Hypertension   ° Hypothyroidism   ° Multiple joint pain 09/04/2015  ° Obesity   ° Rosacea   ° Undiagnosed cardiac murmurs   ° Unspecified essential hypertension   ° ° °Past Surgical History:  °  Procedure Laterality Date   APPENDECTOMY  2009   [facility] [place]   CESAREAN SECTION  2016   CESAREAN SECTION N/A 11/15/2018   Procedure: REPEAT CESAREAN SECTION;  Surgeon: Homero Fellers, MD;  Location: [facility] ORS;  Service: Obstetrics;  Laterality: N/A;  Female born @ [birth year] Apgars: 8/9 Weight: 6lbs 10 ozs   CHOLECYSTECTOMY N/A 03/20/2016   Procedure: LAPAROSCOPIC CHOLECYSTECTOMY;  Surgeon: Jules Husbands, MD;  Location: [facility] ORS;  Service: General;  Laterality: N/A;   DILATION AND CURETTAGE OF UTERUS      Family History  Problem Relation Age of Onset   Mitral valve prolapse Mother    Arrhythmia Mother    Hypertension Mother    Diabetes Mother    Hypertension Father    Hyperlipidemia Father    Heart disease Maternal Grandfather    Lung cancer Maternal Grandfather    Cancer Maternal Grandfather     Diabetes Brother    Heart disease Maternal Aunt    Diabetes Maternal Aunt    Depression Maternal Aunt    Cancer Maternal Grandmother        ovarian melanoma   Diabetes Maternal Grandmother    Diabetes Maternal Uncle     Social History   Socioeconomic History   Marital status: Married    Spouse name: Harrell Gave   Number of children: 3   Years of education: Not on file   Highest education level: Not on file  Occupational History   Occupation: Homemaker  Tobacco Use   Smoking status: Former    Packs/day: 0.25    Years: 1.00    Pack years: 0.25    Types: Cigarettes    Quit date: 03/13/1998    Years since quitting: 22.9   Smokeless tobacco: Never  Vaping Use   Vaping Use: Never used  Substance and Sexual Activity   Alcohol use: No   Drug use: No   Sexual activity: Not Currently    Birth control/protection: I.U.D.    Comment: Mirena   Other Topics Concern   Not on file  Social History Narrative          Social Determinants of Health   Financial Resource Strain: Not on file  Food Insecurity: Not on file  Transportation Needs: Not on file  Physical Activity: Not on file  Stress: Not on file  Social Connections: Not on file  Intimate Partner Violence: Not on file   Review of Systems Doesn't sleep well--nothing new Depression may still be active per husband--sleeping in day.  Actually forgot to pick up kids once due to napping--she states this was last year Some concerns about their well water--bad filtration system taken out and not replaced (asked them to test water and replace filter)    Objective:   Physical Exam Constitutional:      Appearance: Normal appearance.  Cardiovascular:     Rate and Rhythm: Normal rate and regular rhythm.     Heart sounds: No murmur heard.   No gallop.  Pulmonary:     Effort: Pulmonary effort is normal.     Breath sounds: Normal breath sounds. No wheezing or rales.  Abdominal:     General: Bowel sounds are normal.      Palpations: Abdomen is soft.     Tenderness: There is no guarding or rebound.     Comments: Mild tenderness along lower quadrants---deep palpation done though without sig findings  Musculoskeletal:     Cervical back: Neck supple.     Right lower leg:  No edema.  °   Left lower leg: No edema.  °Lymphadenopathy:  °   Cervical: No cervical adenopathy.  °Neurological:  °   Mental Status: She is alert.  °Psychiatric:  °   Comments: Does not seem overtly depressed  °  ° ° ° ° °   °Assessment & Plan:  ° °

## 2021-02-16 NOTE — Assessment & Plan Note (Signed)
Seems to be typical self limited viral illness Rapid and severe symptoms not really consistent with problems with trulicity Discussed supportive care

## 2021-02-16 NOTE — Patient Instructions (Signed)
Please wait 1-2 weeks---but then increase the trulicity to 1.5mg  weekly

## 2021-02-22 ENCOUNTER — Other Ambulatory Visit: Payer: Self-pay | Admitting: Internal Medicine

## 2021-03-15 ENCOUNTER — Other Ambulatory Visit: Payer: Self-pay

## 2021-03-15 ENCOUNTER — Other Ambulatory Visit: Payer: Self-pay | Admitting: Family

## 2021-03-15 ENCOUNTER — Telehealth (INDEPENDENT_AMBULATORY_CARE_PROVIDER_SITE_OTHER): Payer: Medicaid Other | Admitting: Family

## 2021-03-15 ENCOUNTER — Encounter: Payer: Self-pay | Admitting: Family

## 2021-03-15 DIAGNOSIS — R062 Wheezing: Secondary | ICD-10-CM | POA: Diagnosis not present

## 2021-03-15 DIAGNOSIS — J069 Acute upper respiratory infection, unspecified: Secondary | ICD-10-CM | POA: Insufficient documentation

## 2021-03-15 DIAGNOSIS — B3731 Acute candidiasis of vulva and vagina: Secondary | ICD-10-CM | POA: Insufficient documentation

## 2021-03-15 DIAGNOSIS — R197 Diarrhea, unspecified: Secondary | ICD-10-CM | POA: Diagnosis not present

## 2021-03-15 MED ORDER — FLUCONAZOLE 150 MG PO TABS: 150.0000 mg | ORAL_TABLET | Freq: Once | ORAL | 0 refills | Status: AC

## 2021-03-15 MED ORDER — AMOXICILLIN-POT CLAVULANATE 875-125 MG PO TABS
1.0000 | ORAL_TABLET | Freq: Two times a day (BID) | ORAL | 0 refills | Status: AC
Start: 2021-03-15 — End: 2021-03-25

## 2021-03-15 MED ORDER — ALBUTEROL SULFATE HFA 108 (90 BASE) MCG/ACT IN AERS: 2.0000 | INHALATION_SPRAY | Freq: Four times a day (QID) | RESPIRATORY_TRACT | 0 refills | Status: DC | PRN

## 2021-03-15 NOTE — Assessment & Plan Note (Signed)
Diflucan sent to pharmacy for patient ?Did discuss with patient that when diabetes is uncontrolled it would lead to more frequent yeast infections so we need to get the diabetes under control. ?

## 2021-03-15 NOTE — Patient Instructions (Signed)
Antibiotic sent to preferred pharmacy.   Please increase oral fluids, steamy hot shower/humidifier prn.  Please follow up if no improvement in 2-3 days.   It was a pleasure seeing you today! Please do not hesitate to reach out with any questions and or concerns.  Regards,   Helmuth Recupero   

## 2021-03-15 NOTE — Assessment & Plan Note (Signed)
Take antibiotic as prescribed. Increase oral fluids. Pt to f/u if sx worsen and or fail to improve in 2-3 days. ?Albuterol sent to pharmacy for shortness of breath ? ?

## 2021-03-15 NOTE — Assessment & Plan Note (Signed)
Albuterol sent to pharmacy as needed ?

## 2021-03-15 NOTE — Progress Notes (Signed)
MyChart Video Visit    Virtual Visit via Video Note   This visit type was conducted due to national recommendations for restrictions regarding the COVID-19 Pandemic (e.g. social distancing) in an effort to limit this patient's exposure and mitigate transmission in our community. This patient is at least at moderate risk for complications without adequate follow up. This format is felt to be most appropriate for this patient at this time. Physical exam was limited by quality of the video and audio technology used for the visit. CMA was able to get the patient set up on a video visit.  Patient location: Home. Patient and provider in visit Provider location: Office  I discussed the limitations of evaluation and management by telemedicine and the availability of in person appointments. The patient expressed understanding and agreed to proceed.  Visit Date: 03/15/2021  Today's healthcare provider: Eugenia Pancoast, FNP     Subjective:    Patient ID: Anita Massey, female    DOB: 04/09/1980, 41 y.o.   MRN: [id number]  Chief Complaint  Patient presents with   Nasal Congestion     coughing wheezing no fever, sore throat from coughing--2 days    HPI  Pt here with c/o 6 days symptoms of chest congestion, coughing so bad her chest hurts, wheezing, chills and feeling hot. Thermometer states no fever but takes tylenol/ibuprofen has cold sweats like she broke a fever. Thinks bronchitis.  Sore throat from coughing.   Daughter with same sx last week tested negative for covid flu and rsv.  Has not tested herself for covid.   Also to note had uncontrollable sudden diarrhea that she needed up having an accident on the floor at [employer], stopped trulicity altogether. Not tolerating the higher dose at all. Was tolerating the lower dose of trulicity.no longer with diarrhea.   Also with vaginal itching and uncomfortable. Used monistat with no relief.   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)   Clotting disorder (HCC)    Diabetes mellitus without complication (HCC)    GERD (gastroesophageal reflux disease)    OCC   Homozygous for MTHFR gene mutation    Hypertension    Hypothyroidism    Multiple joint pain 09/04/2015   Obesity    Rosacea    Undiagnosed cardiac murmurs    Unspecified essential hypertension     Past Surgical History:  Procedure Laterality Date   APPENDECTOMY  2009   [facility]   CESAREAN SECTION  2016   CESAREAN SECTION N/A 11/15/2018   Procedure: REPEAT CESAREAN SECTION;  Surgeon: Homero Fellers, MD;  Location: [facility] ORS;  Service: Obstetrics;  Laterality: N/A;  Female born @ 14 Apgars: 8/9 Weight: 6lbs 10 ozs   CHOLECYSTECTOMY N/A 03/20/2016   Procedure: LAPAROSCOPIC CHOLECYSTECTOMY;  Surgeon: Jules Husbands, MD;  Location: [facility] ORS;  Service: General;  Laterality: N/A;   DILATION AND CURETTAGE OF UTERUS      Family History  Problem Relation Age of Onset   Mitral valve prolapse Mother    Arrhythmia Mother    Hypertension Mother    Diabetes Mother    Hypertension Father    Hyperlipidemia Father    Heart disease Maternal Grandfather    Lung cancer Maternal Grandfather    Cancer Maternal Grandfather    Diabetes Brother    Heart disease Maternal Aunt  Diabetes Maternal Aunt    Depression Maternal Aunt    Cancer Maternal Grandmother        ovarian melanoma   Diabetes Maternal Grandmother    Diabetes Maternal Uncle     Social History   Socioeconomic History   Marital status: Married    Spouse name: Harrell Gave   Number of children: 3   Years of education: Not on file   Highest education level: Not on file  Occupational History   Occupation: Homemaker  Tobacco Use   Smoking status: Former    Packs/day: 0.25    Years: 1.00    Pack years: 0.25    Types: Cigarettes    Quit date: 03/13/1998    Years since quitting: 23.0    Smokeless tobacco: Never  Vaping Use   Vaping Use: Never used  Substance and Sexual Activity   Alcohol use: No   Drug use: No   Sexual activity: Not Currently    Birth control/protection: I.U.D.    Comment: Mirena   Other Topics Concern   Not on file  Social History Narrative          Social Determinants of Health   Financial Resource Strain: Not on file  Food Insecurity: Not on file  Transportation Needs: Not on file  Physical Activity: Not on file  Stress: Not on file  Social Connections: Not on file  Intimate Partner Violence: Not on file    Outpatient Medications Prior to Visit  Medication Sig Dispense Refill   Accu-Chek Softclix Lancets lancets Use to check blood sugar twice a day 100 each 12   ALPRAZolam (XANAX) 0.25 MG tablet Take 1 tablet (0.25 mg total) by mouth 2 (two) times daily as needed for anxiety. 30 tablet 0   amLODipine (NORVASC) 10 MG tablet Take 1 tablet by mouth once daily 90 tablet 3   blood glucose meter kit and supplies KIT Dispense based on patient and insurance preference. Use up to four times daily as directed. (FOR ICD-9 250.00, 250.01). 1 each 0   dapagliflozin propanediol (FARXIGA) 10 MG TABS tablet Take 1 tablet (10 mg total) by mouth daily before breakfast. 30 tablet 11   Dulaglutide (TRULICITY) 1.5 AC/1.6SA SOPN Inject 1.5 mg into the skin once a week. 2 mL 11   glipiZIDE (GLUCOTROL XL) 5 MG 24 hr tablet Take 1 tablet (5 mg total) by mouth in the morning and at bedtime. 60 tablet 11   glucose blood (ACCU-CHEK GUIDE) test strip USE TO TEST BLOOD SUGAR TWICE DAILY 100 each 11   ketoconazole (NIZORAL) 2 % cream Apply 1 application topically in the morning and at bedtime. 60 g 5   ondansetron (ZOFRAN ODT) 4 MG disintegrating tablet Take 1 tablet (4 mg total) by mouth every 8 (eight) hours as needed for nausea or vomiting. 20 tablet 0   sertraline (ZOLOFT) 100 MG tablet Take 1 tablet by mouth once daily 30 tablet 11   doxycycline (VIBRAMYCIN) 100 MG  capsule Take 1 capsule (100 mg total) by mouth daily. 30 capsule 11   fluconazole (DIFLUCAN) 150 MG tablet Take 1 tablet (150 mg total) by mouth once a week. 4 tablet 1   No facility-administered medications prior to visit.    Allergies  Allergen Reactions   Biaxin [Clarithromycin] Hives   Metformin And Related Nausea And Vomiting    Also bad tingling   Toradol [Ketorolac Tromethamine]     Chest pain    Prednisone Anxiety and Palpitations    "Makes  me feel crazy"   Morphine And Related     Chest Pain    Tape Rash    Paper tape is fine    Review of Systems  Constitutional:  Positive for chills and fever (suspected).  HENT:  Positive for ear pain (bil ear fullness slight pain), postnasal drip, sinus pressure and sore throat. Negative for congestion.   Respiratory:  Positive for cough and wheezing. Negative for shortness of breath.   Cardiovascular:  Negative for chest pain and palpitations.  Genitourinary:  Positive for vaginal discharge.  Neurological:  Positive for headaches.      Objective:    Physical Exam Constitutional:      General: She is not in acute distress.    Appearance: Normal appearance. She is not ill-appearing, toxic-appearing or diaphoretic.  Pulmonary:     Effort: Pulmonary effort is normal.  Neurological:     Mental Status: She is alert.  Psychiatric:        Mood and Affect: Mood normal.        Behavior: Behavior normal.        Thought Content: Thought content normal.        Judgment: Judgment normal.    There were no vitals taken for this visit. Wt Readings from Last 3 Encounters:  02/16/21 153 lb (69.4 kg)  08/28/20 156 lb (70.8 kg)  03/28/20 155 lb (70.3 kg)       Assessment & Plan:   Problem List Items Addressed This Visit       Respiratory   Acute upper respiratory infection - Primary    Take antibiotic as prescribed. Increase oral fluids. Pt to f/u if sx worsen and or fail to improve in 2-3 days. Albuterol sent to pharmacy for  shortness of breath       Relevant Medications   amoxicillin-clavulanate (AUGMENTIN) 875-125 MG tablet   albuterol (VENTOLIN HFA) 108 (90 Base) MCG/ACT inhaler   fluconazole (DIFLUCAN) 150 MG tablet     Genitourinary   Vaginal candida    Diflucan sent to pharmacy for patient Did discuss with patient that when diabetes is uncontrolled it would lead to more frequent yeast infections so we need to get the diabetes under control.      Relevant Medications   fluconazole (DIFLUCAN) 150 MG tablet     Other   Wheezing    Albuterol sent to pharmacy as needed      Relevant Medications   amoxicillin-clavulanate (AUGMENTIN) 875-125 MG tablet   albuterol (VENTOLIN HFA) 108 (90 Base) MCG/ACT inhaler   Diarrhea    Has since resolved with discontinuation of Trulicity.  Will route to PCP to review and see if wants to start on a different medication for diabetes control.       I have discontinued Royetta Crochet. Stanbery's doxycycline and fluconazole. I am also having her start on amoxicillin-clavulanate, albuterol, and fluconazole. Additionally, I am having her maintain her blood glucose meter kit and supplies, ALPRAZolam, ondansetron, ketoconazole, Accu-Chek Softclix Lancets, dapagliflozin propanediol, glipiZIDE, sertraline, Trulicity, amLODipine, and Accu-Chek Guide.  Meds ordered this encounter  Medications   amoxicillin-clavulanate (AUGMENTIN) 875-125 MG tablet    Sig: Take 1 tablet by mouth 2 (two) times daily for 10 days.    Dispense:  20 tablet    Refill:  0    Order Specific Question:   Supervising Provider    Answer:   BEDSOLE, AMY E [[id number]]   albuterol (VENTOLIN HFA) 108 (90 Base) MCG/ACT inhaler    Sig:  Inhale 2 puffs into the lungs every 6 (six) hours as needed for wheezing or shortness of breath.    Dispense:  8 g    Refill:  0    Order Specific Question:   Supervising Provider    Answer:   BEDSOLE, AMY E [[id number]]   fluconazole (DIFLUCAN) 150 MG tablet    Sig: Take 1 tablet (150 mg  total) by mouth once for 1 dose.    Dispense:  1 tablet    Refill:  0    Order Specific Question:   Supervising Provider    Answer:   BEDSOLE, AMY E [[id number]]    I discussed the assessment and treatment plan with the patient. The patient was provided an opportunity to ask questions and all were answered. The patient agreed with the plan and demonstrated an understanding of the instructions.   The patient was advised to call back or seek an in-person evaluation if the symptoms worsen or if the condition fails to improve as anticipated.  I provided 15 minutes of face-to-face time during this encounter.   Eugenia Pancoast, [place] [facility] [place] [phone] (phone) [phone] (fax)  [place]

## 2021-03-15 NOTE — Assessment & Plan Note (Signed)
Has since resolved with discontinuation of Trulicity.  Will route to PCP to review and see if wants to start on a different medication for diabetes control. ?

## 2021-03-20 ENCOUNTER — Other Ambulatory Visit: Payer: Self-pay | Admitting: Family

## 2021-03-20 DIAGNOSIS — E1159 Type 2 diabetes mellitus with other circulatory complications: Secondary | ICD-10-CM

## 2021-03-20 MED ORDER — TRULICITY 0.75 MG/0.5ML ~~LOC~~ SOAJ: 0.7500 mg | SUBCUTANEOUS | 2 refills | Status: DC

## 2021-04-15 DIAGNOSIS — R42 Dizziness and giddiness: Secondary | ICD-10-CM | POA: Diagnosis not present

## 2021-04-15 DIAGNOSIS — R519 Headache, unspecified: Secondary | ICD-10-CM | POA: Diagnosis not present

## 2021-04-15 DIAGNOSIS — R110 Nausea: Secondary | ICD-10-CM | POA: Diagnosis not present

## 2021-04-16 ENCOUNTER — Telehealth: Payer: Self-pay | Admitting: Internal Medicine

## 2021-04-16 NOTE — Telephone Encounter (Signed)
Pt called in stated she is unable to get an appointment with Endocrinology until September need something sooner would like to be referred somewhere . Please advise [phone]  ?

## 2021-04-17 NOTE — Telephone Encounter (Signed)
Message sent to patient via mychart making aware of alternative locations. Awaiting patient response as to where she wants to be referred.  ? ?All these locations are going to be booked out similar to [facility] I'm afraid. Unfortunately I have no say over their scheduling time frame, they review the referral and Office Visit notes and determine if the patient needs to be seen urgently or first available. [facility] was not able to schedule patient any sooner than September 2023.  ? ?FYI Dr Silvio Pate ?

## 2021-05-09 ENCOUNTER — Ambulatory Visit: Payer: Medicaid Other | Admitting: Nurse Practitioner

## 2021-05-10 ENCOUNTER — Telehealth: Payer: Self-pay

## 2021-05-10 ENCOUNTER — Ambulatory Visit: Payer: Medicaid Other | Admitting: Nurse Practitioner

## 2021-05-10 VITALS — BP 124/85 | HR 94 | Temp 97.6°F | Resp 14 | Ht 63.0 in | Wt 152.1 lb

## 2021-05-10 DIAGNOSIS — N898 Other specified noninflammatory disorders of vagina: Secondary | ICD-10-CM | POA: Insufficient documentation

## 2021-05-10 DIAGNOSIS — J069 Acute upper respiratory infection, unspecified: Secondary | ICD-10-CM | POA: Diagnosis not present

## 2021-05-10 DIAGNOSIS — J029 Acute pharyngitis, unspecified: Secondary | ICD-10-CM | POA: Insufficient documentation

## 2021-05-10 MED ORDER — FLUTICASONE PROPIONATE 50 MCG/ACT NA SUSP: 2.0000 | Freq: Every day | NASAL | 0 refills | Status: AC

## 2021-05-10 MED ORDER — AMOXICILLIN 500 MG PO CAPS: 500.0000 mg | ORAL_CAPSULE | Freq: Three times a day (TID) | ORAL | 0 refills | Status: AC

## 2021-05-10 NOTE — Patient Instructions (Signed)
Nice to see you today ?I will be in touch with the lab results once I have them ?Sent in some amoxicillin to the pharmacy, will add on some flonase as well ?Follow up if no improvement ?

## 2021-05-10 NOTE — Progress Notes (Signed)
? ?Acute Office Visit ? ?Subjective:  ? ?  ?Patient ID: Anita Massey, female    DOB: [birth year], 41 y.o.   MRN: [id number] ? ?Chief Complaint  ?Patient presents with  ? Sore Throat  ?  Over a week now, off and on, hoarse voice. No fever. No covid test.  ? Vaginal Itching  ?  Sx started about 3 to 4 days ago. No abnormal discharge.   ? ? ? ?Patient is in today for sore throat ? ?Symptoms over a week plus some. Kid is sick but other two kids are ok ?Has been using lozenges with minor relief. ?No covid test ?No flu vaccine ?No covid vaccine ?Bad this whole week ? ? ?Vaginal itching: symptoms started approx 3-4 days ago. States she was told that she will keep. States that she started with some discharge. No color per her report. No concern for STI. Patient is currently working on getting her diabetes under control as they has been having a difficult time.  ? ?Review of Systems  ?Constitutional:  Positive for malaise/fatigue. Negative for chills and fever.  ?HENT:  Positive for sore throat. Negative for congestion, ear discharge, ear pain and sinus pain.   ?Respiratory:  Positive for cough (non productive). Negative for shortness of breath.   ?Cardiovascular:  Negative for chest pain.  ?Musculoskeletal:  Negative for joint pain and myalgias.  ?Neurological:  Positive for headaches.  ? ? ?   ?Objective:  ?  ?BP 124/85   Pulse 94   Temp 97.6 ?F (36.4 ?C)   Resp 14   Ht '5\' 3"'$  (1.6 m)   Wt 152 lb 1 oz (69 kg)   SpO2 98%   BMI 26.94 kg/m?  ? ? ?Physical Exam ?Vitals and nursing note reviewed.  ?Constitutional:   ?   Appearance: She is well-developed.  ?HENT:  ?   Right Ear: Tympanic membrane and ear canal normal.  ?   Left Ear: Tympanic membrane and ear canal normal.  ?   Mouth/Throat:  ?   Mouth: Mucous membranes are moist.  ?   Pharynx: No posterior oropharyngeal erythema.  ?Cardiovascular:  ?   Rate and Rhythm: Normal rate and regular rhythm.  ?   Heart sounds: Normal heart sounds.  ?Pulmonary:  ?   Effort:  Pulmonary effort is normal.  ?   Breath sounds: Normal breath sounds.  ?Abdominal:  ?   General: Bowel sounds are normal. There is no distension.  ?   Palpations: There is no mass.  ?   Tenderness: There is no abdominal tenderness.  ?   Hernia: No hernia is present.  ?Lymphadenopathy:  ?   Cervical: Cervical adenopathy present.  ?Neurological:  ?   Mental Status: She is alert.  ? ? ?No results found for any visits on 05/10/21. ? ? ?   ?Assessment & Plan:  ? ?Problem List Items Addressed This Visit   ? ?  ? Respiratory  ? Upper respiratory tract infection  ?  Patient has been having symptoms for over a week with no improvement.  She has been using over-the-counter treatments without great relief.  She did have cervical lymphadenopathy and sore throat.  We will defer strep testing is elected to treat with amoxicillin 500 mg 3 times daily for 7 days.  Follow-up if no improvement ? ?  ?  ? Relevant Medications  ? amoxicillin (AMOXIL) 500 MG capsule  ? fluticasone (FLONASE) 50 MCG/ACT nasal spray  ?  ? Genitourinary  ?  Vaginal itching  ?  Vaginal itching.  No concern for STI per patient report.  We will send off wet prep pending result likely to send in Diflucan regardless because patient was placed on antibiotic today in office ? ?  ?  ? Relevant Orders  ? WET PREP BY MOLECULAR PROBE  ?  ? Other  ? Sore throat - Primary  ?  URI which will cover both ? ?  ?  ? Relevant Medications  ? fluticasone (FLONASE) 50 MCG/ACT nasal spray  ? ? ?No orders of the defined types were placed in this encounter. ? ? ?No follow-ups on file. ? ?Romilda Garret, NP ? ? ?

## 2021-05-10 NOTE — Telephone Encounter (Signed)
Patient called back have made appointment in office.  ?

## 2021-05-10 NOTE — Telephone Encounter (Signed)
Tried to call pt. No answer and no VM. ?

## 2021-05-10 NOTE — Assessment & Plan Note (Signed)
URI which will cover both ?

## 2021-05-10 NOTE — Assessment & Plan Note (Signed)
Vaginal itching.  No concern for STI per patient report.  We will send off wet prep pending result likely to send in Diflucan regardless because patient was placed on antibiotic today in office ?

## 2021-05-10 NOTE — Assessment & Plan Note (Signed)
Patient has been having symptoms for over a week with no improvement.  She has been using over-the-counter treatments without great relief.  She did have cervical lymphadenopathy and sore throat.  We will defer strep testing is elected to treat with amoxicillin 500 mg 3 times daily for 7 days.  Follow-up if no improvement ?

## 2021-05-10 NOTE — Telephone Encounter (Signed)
Pt says her blood sugars have been 350-400 the last few weeks. She is taking the lower dose Trulicity, Faxiga, and glipizide. She is asking what else we can do. Please advise. ?

## 2021-05-10 NOTE — Telephone Encounter (Signed)
Patient called back reviewed information. She has tried the 1.5 Trulicity in the past bur was not able to take. Had very bad GI symptoms. Loose stools, and abdominal pain.  ?

## 2021-05-11 ENCOUNTER — Other Ambulatory Visit: Payer: Self-pay | Admitting: Nurse Practitioner

## 2021-05-11 ENCOUNTER — Encounter: Payer: Self-pay | Admitting: Internal Medicine

## 2021-05-11 ENCOUNTER — Ambulatory Visit: Payer: Medicaid Other | Admitting: Internal Medicine

## 2021-05-11 DIAGNOSIS — E1159 Type 2 diabetes mellitus with other circulatory complications: Secondary | ICD-10-CM

## 2021-05-11 DIAGNOSIS — B3731 Acute candidiasis of vulva and vagina: Secondary | ICD-10-CM

## 2021-05-11 LAB — POCT GLYCOSYLATED HEMOGLOBIN (HGB A1C): Hemoglobin A1C: 8.9 % — AB (ref 4.0–5.6)

## 2021-05-11 LAB — WET PREP BY MOLECULAR PROBE
Candida species: DETECTED — AB
Gardnerella vaginalis: NOT DETECTED
MICRO NUMBER:: 13321075
SPECIMEN QUALITY:: ADEQUATE
Trichomonas vaginosis: NOT DETECTED

## 2021-05-11 MED ORDER — FLUCONAZOLE 150 MG PO TABS: 150.0000 mg | ORAL_TABLET | Freq: Once | ORAL | 0 refills | Status: DC

## 2021-05-11 MED ORDER — GLIPIZIDE ER 10 MG PO TB24: 10.0000 mg | ORAL_TABLET | Freq: Two times a day (BID) | ORAL | 3 refills | Status: DC

## 2021-05-11 MED ORDER — FLUCONAZOLE 150 MG PO TABS: 150.0000 mg | ORAL_TABLET | ORAL | 2 refills | Status: DC

## 2021-05-11 NOTE — Progress Notes (Signed)
? ?Subjective:  ? ? Patient ID: Anita Massey, female    DOB: 1980-11-16, 41 y.o.   MRN: [id number] ? ?HPI ?Here due to elevated sugar readings ?With husband and son ? ?Sugars have stayed high since last visit ?Checks at various times--but can still be up to 300 even when fasting ?Didn't tolerate the increase of trulicity---GI distress and fecal incontinence ? ?Has yeast infection again--related to [place] ?Seen yesterday---amoxil for throat and fluconazole ? ?Current Outpatient Medications on File Prior to Visit  ?Medication Sig Dispense Refill  ? Accu-Chek Softclix Lancets lancets Use to check blood sugar twice a day 100 each 12  ? albuterol (VENTOLIN HFA) 108 (90 Base) MCG/ACT inhaler Inhale 2 puffs into the lungs every 6 (six) hours as needed for wheezing or shortness of breath. 8 g 0  ? ALPRAZolam (XANAX) 0.25 MG tablet Take 1 tablet (0.25 mg total) by mouth 2 (two) times daily as needed for anxiety. 30 tablet 0  ? amLODipine (NORVASC) 10 MG tablet Take 1 tablet by mouth once daily 90 tablet 3  ? amoxicillin (AMOXIL) 500 MG capsule Take 1 capsule (500 mg total) by mouth 3 (three) times daily for 7 days. 21 capsule 0  ? blood glucose meter kit and supplies KIT Dispense based on patient and insurance preference. Use up to four times daily as directed. (FOR ICD-9 250.00, 250.01). 1 each 0  ? dapagliflozin propanediol (FARXIGA) 10 MG TABS tablet Take 1 tablet (10 mg total) by mouth daily before breakfast. 30 tablet 11  ? Dulaglutide (TRULICITY) 8.89 VQ/9.4HW SOPN Inject 0.75 mg into the skin once a week. 2 mL 2  ? fluticasone (FLONASE) 50 MCG/ACT nasal spray Place 2 sprays into both nostrils daily. 16 g 0  ? glipiZIDE (GLUCOTROL XL) 5 MG 24 hr tablet Take 1 tablet (5 mg total) by mouth in the morning and at bedtime. 60 tablet 11  ? glucose blood (ACCU-CHEK GUIDE) test strip USE TO TEST BLOOD SUGAR TWICE DAILY 100 each 11  ? ketoconazole (NIZORAL) 2 % cream Apply 1 application topically in the morning and at  bedtime. 60 g 5  ? ondansetron (ZOFRAN ODT) 4 MG disintegrating tablet Take 1 tablet (4 mg total) by mouth every 8 (eight) hours as needed for nausea or vomiting. 20 tablet 0  ? sertraline (ZOLOFT) 100 MG tablet Take 1 tablet by mouth once daily 30 tablet 11  ? ?No current facility-administered medications on file prior to visit.  ? ? ?Allergies  ?Allergen Reactions  ? Biaxin [Clarithromycin] Hives  ? Metformin And Related Nausea And Vomiting  ?  Also bad tingling  ? Toradol [Ketorolac Tromethamine]   ?  Chest pain   ? Prednisone Anxiety and Palpitations  ?  "Makes me feel crazy"  ? Morphine And Related   ?  Chest Pain   ? Trulicity [Dulaglutide] Diarrhea  ?  Bowel incontinence  ? Tape Rash  ?  Paper tape is fine  ? ? ?Past Medical History:  ?Diagnosis Date  ? Anemia   ? Anxiety state, unspecified   ? Blood dyscrasia   ? PROTHROMBIN G 38882 HETEROZYGOSITY (FACTOR 2)  ? Clotting disorder ([place])   ? Diabetes mellitus without complication ([place])   ? GERD (gastroesophageal reflux disease)   ? OCC  ? Homozygous for MTHFR gene mutation   ? Hypertension   ? Hypothyroidism   ? Multiple joint pain 09/04/2015  ? Obesity   ? Rosacea   ? Undiagnosed cardiac murmurs   ?  Unspecified essential hypertension   ? ? ?Past Surgical History:  ?Procedure Laterality Date  ? APPENDECTOMY  2009  ? [place]  ? CESAREAN SECTION  2016  ? CESAREAN SECTION N/A 11/15/2018  ? Procedure: REPEAT CESAREAN SECTION;  Surgeon: Homero Fellers, MD;  Location: [facility] ORS;  Service: Obstetrics;  Laterality: N/A;  Female born @ [birth year] ?Apgars: 8/9 ?Weight: 6lbs 10 ozs  ? CHOLECYSTECTOMY N/A 03/20/2016  ? Procedure: LAPAROSCOPIC CHOLECYSTECTOMY;  Surgeon: Jules Husbands, MD;  Location: [facility] ORS;  Service: General;  Laterality: N/A;  ? DILATION AND CURETTAGE OF UTERUS    ? ? ?Family History  ?Problem Relation Age of Onset  ? Mitral valve prolapse Mother   ? Arrhythmia Mother   ? Hypertension Mother   ? Diabetes Mother   ? Hypertension Father   ? Hyperlipidemia Father    ? Heart disease Maternal Grandfather   ? Lung cancer Maternal Grandfather   ? Cancer Maternal Grandfather   ? Diabetes Brother   ? Heart disease Maternal Aunt   ? Diabetes Maternal Aunt   ? Depression Maternal Aunt   ? Cancer Maternal Grandmother   ?     ovarian melanoma  ? Diabetes Maternal Grandmother   ? Diabetes Maternal Uncle   ? ? ?Social History  ? ?Socioeconomic History  ? Marital status: Married  ?  Spouse name: Harrell Gave  ? Number of children: 3  ? Years of education: Not on file  ? Highest education level: Not on file  ?Occupational History  ? Occupation: Homemaker  ?Tobacco Use  ? Smoking status: Former  ?  Packs/day: 0.25  ?  Years: 1.00  ?  Pack years: 0.25  ?  Types: Cigarettes  ?  Quit date: 03/13/1998  ?  Years since quitting: 23.1  ? Smokeless tobacco: Never  ?Vaping Use  ? Vaping Use: Never used  ?Substance and Sexual Activity  ? Alcohol use: No  ? Drug use: No  ? Sexual activity: Not Currently  ?  Birth control/protection: I.U.D.  ?  Comment: Mirena   ?Other Topics Concern  ? Not on file  ?Social History Narrative  ?   ?    ? ?Social Determinants of Health  ? ?Financial Resource Strain: Not on file  ?Food Insecurity: Not on file  ?Transportation Needs: Not on file  ?Physical Activity: Not on file  ?Stress: Not on file  ?Social Connections: Not on file  ?Intimate Partner Violence: Not on file  ? ?Review of Systems ?Weight is stable ?Now having some left ear pain ? ?   ?Objective:  ? Physical Exam ?Constitutional:   ?   Appearance: Normal appearance.  ?HENT:  ?   Right Ear: Tympanic membrane and ear canal normal.  ?   Left Ear: Tympanic membrane and ear canal normal.  ?Neurological:  ?   Mental Status: She is alert.  ?Psychiatric:     ?   Mood and Affect: Mood normal.     ?   Behavior: Behavior normal.  ?  ? ? ? ? ?   ?Assessment & Plan:  ? ?

## 2021-05-11 NOTE — Patient Instructions (Signed)
Double the glipizide to '10mg'$  twice a day. ?If your sugars aren't much better in 1-2 weeks, let me know and I will prescribe the insulin ?

## 2021-05-11 NOTE — Addendum Note (Signed)
Addended by: Pilar Grammes on: 05/11/2021 12:59 PM ? ? Modules accepted: Orders ? ?

## 2021-05-11 NOTE — Assessment & Plan Note (Signed)
Lab Results  ?Component Value Date  ? HGBA1C 9.7 (A) 02/16/2021  ? ?Now down to 8.9% ?On trulicity 9.77--SFS can't increase ?Glipizide 5 bid and farxiga 10 ? ?Discussed alternatives---actos (I am not excited about this), increasing glipizide to 10 bid, starting lantus (or equivalent). ?Will increase the glipizide to 10 bid ?If no low sugars in the next week or so, will start the lantus (or equivalent) at 10 units and titrate ?

## 2021-05-16 ENCOUNTER — Encounter: Payer: Self-pay | Admitting: Internal Medicine

## 2021-05-16 ENCOUNTER — Ambulatory Visit: Payer: Medicaid Other | Admitting: Internal Medicine

## 2021-05-16 MED ORDER — INSULIN GLARGINE 100 UNIT/ML ~~LOC~~ SOLN: 10.0000 [IU] | Freq: Every day | SUBCUTANEOUS | 11 refills | Status: DC

## 2021-05-16 NOTE — Progress Notes (Unsigned)
See note

## 2021-05-18 ENCOUNTER — Other Ambulatory Visit: Payer: Self-pay | Admitting: Internal Medicine

## 2021-05-18 MED ORDER — DAPAGLIFLOZIN PROPANEDIOL 10 MG PO TABS
10.0000 mg | ORAL_TABLET | Freq: Every day | ORAL | 11 refills | Status: DC
Start: 2021-05-18 — End: 2022-06-27

## 2021-08-03 ENCOUNTER — Ambulatory Visit: Payer: Medicaid Other | Admitting: Internal Medicine

## 2021-09-10 DIAGNOSIS — M79672 Pain in left foot: Secondary | ICD-10-CM | POA: Diagnosis not present

## 2021-09-10 DIAGNOSIS — S82831A Other fracture of upper and lower end of right fibula, initial encounter for closed fracture: Secondary | ICD-10-CM | POA: Diagnosis not present

## 2021-09-10 DIAGNOSIS — M79661 Pain in right lower leg: Secondary | ICD-10-CM | POA: Diagnosis not present

## 2021-09-10 DIAGNOSIS — M79662 Pain in left lower leg: Secondary | ICD-10-CM | POA: Diagnosis not present

## 2021-09-10 DIAGNOSIS — M79671 Pain in right foot: Secondary | ICD-10-CM | POA: Diagnosis not present

## 2021-09-20 DIAGNOSIS — S82831A Other fracture of upper and lower end of right fibula, initial encounter for closed fracture: Secondary | ICD-10-CM | POA: Diagnosis not present

## 2021-10-02 ENCOUNTER — Telehealth: Payer: Self-pay | Admitting: Internal Medicine

## 2021-10-02 MED ORDER — SERTRALINE HCL 100 MG PO TABS: 150.0000 mg | ORAL_TABLET | Freq: Every day | ORAL | 11 refills | Status: DC

## 2021-10-02 NOTE — Telephone Encounter (Signed)
Spoke to pt. She will start the increased amount. I sent a new rx with the new directions and made her an OV for 10-19-21.

## 2021-10-02 NOTE — Telephone Encounter (Signed)
Patient called in stating that she is already on rx  sertraline (ZOLOFT) 100 MG tablet,however her anxiety is worsening. She would like to know is there anything else that she can take,or should her dosage be increased? Please advise.

## 2021-10-02 NOTE — Addendum Note (Signed)
Addended by: Pilar Grammes on: 10/02/2021 03:00 PM   Modules accepted: Orders

## 2021-10-19 ENCOUNTER — Ambulatory Visit: Payer: Medicaid Other | Admitting: Internal Medicine

## 2021-10-19 ENCOUNTER — Encounter: Payer: Self-pay | Admitting: Internal Medicine

## 2021-10-19 ENCOUNTER — Other Ambulatory Visit: Payer: Self-pay

## 2021-10-19 VITALS — BP 114/80 | HR 78 | Temp 97.5°F | Ht 63.0 in | Wt 157.0 lb

## 2021-10-19 DIAGNOSIS — F410 Panic disorder [episodic paroxysmal anxiety] without agoraphobia: Secondary | ICD-10-CM | POA: Diagnosis not present

## 2021-10-19 DIAGNOSIS — E1159 Type 2 diabetes mellitus with other circulatory complications: Secondary | ICD-10-CM

## 2021-10-19 LAB — POCT GLYCOSYLATED HEMOGLOBIN (HGB A1C): Hemoglobin A1C: 9.9 % — AB (ref 4.0–5.6)

## 2021-10-19 MED ORDER — INSULIN GLARGINE 100 UNIT/ML ~~LOC~~ SOLN: 30.0000 [IU] | Freq: Every day | SUBCUTANEOUS | 11 refills | Status: DC

## 2021-10-19 MED ORDER — TRULICITY 0.75 MG/0.5ML ~~LOC~~ SOAJ: 0.7500 mg | SUBCUTANEOUS | 2 refills | Status: DC

## 2021-10-19 NOTE — Assessment & Plan Note (Signed)
Lab Results  Component Value Date   HGBA1C 9.9 (A) 10/19/2021   Much worse Discussed needing to cut out all the sugar!! Will refer back to endocrinology Will increase lantus to 30 Glipizide 10 bid farxiga 10 trulicity 6.55---VZS sure if we should try again, since she had a really bad time

## 2021-10-19 NOTE — Assessment & Plan Note (Signed)
With multiple stressors Doing better now since increasing the sertraline to '150mg'$  daily

## 2021-10-19 NOTE — Patient Instructions (Signed)
Increase the lantus to 30 units daily. If the sugars are still elevated in a week or 2, go up to 35--and then up to 40 if that isn't enough.

## 2021-10-19 NOTE — Progress Notes (Signed)
Subjective:    Patient ID: Anita Massey, female    DOB: 1980-06-30, 41 y.o.   MRN: [id number]  HPI Here for follow up of anxiety and poorly controlled diabetes  Hard time since GM passed Had been helping with her care and worried about her mom dealing with it Sertraline up to 150 two weeks ago---now she feels her anxiety is better now  Sugars are "horrible" All over the place---but too high 95% of the time As high as 600---and very high Using freestyle libre--making it easier to monitor  Is on the lantus--now up to 25 Trulicity 9.47MR weekly--didn't tolerate the higher dose Glipizide 10 bid Farxiga 75m daily  Current Outpatient Medications on File Prior to Visit  Medication Sig Dispense Refill   Accu-Chek Softclix Lancets lancets Use to check blood sugar twice a day 100 each 12   albuterol (VENTOLIN HFA) 108 (90 Base) MCG/ACT inhaler Inhale 2 puffs into the lungs every 6 (six) hours as needed for wheezing or shortness of breath. 8 g 0   ALPRAZolam (XANAX) 0.25 MG tablet Take 1 tablet (0.25 mg total) by mouth 2 (two) times daily as needed for anxiety. 30 tablet 0   amLODipine (NORVASC) 10 MG tablet Take 1 tablet by mouth once daily 90 tablet 3   blood glucose meter kit and supplies KIT Dispense based on patient and insurance preference. Use up to four times daily as directed. (FOR ICD-9 250.00, 250.01). 1 each 0   dapagliflozin propanediol (FARXIGA) 10 MG TABS tablet Take 1 tablet (10 mg total) by mouth daily before breakfast. 30 tablet 11   Dulaglutide (TRULICITY) 06.15MHI/3.4PBSOPN Inject 0.75 mg into the skin once a week. 2 mL 2   fluconazole (DIFLUCAN) 150 MG tablet Take 1 tablet (150 mg total) by mouth once a week. As needed 4 tablet 2   fluticasone (FLONASE) 50 MCG/ACT nasal spray Place 2 sprays into both nostrils daily. 16 g 0   glipiZIDE (GLUCOTROL XL) 10 MG 24 hr tablet Take 1 tablet (10 mg total) by mouth in the morning and at bedtime. 180 tablet 3   glucose blood  (ACCU-CHEK GUIDE) test strip USE TO TEST BLOOD SUGAR TWICE DAILY 100 each 11   insulin glargine (LANTUS) 100 UNIT/ML injection Inject 0.1-0.2 mLs (10-20 Units total) into the skin daily. 10 mL 11   ketoconazole (NIZORAL) 2 % cream Apply 1 application topically in the morning and at bedtime. 60 g 5   ondansetron (ZOFRAN ODT) 4 MG disintegrating tablet Take 1 tablet (4 mg total) by mouth every 8 (eight) hours as needed for nausea or vomiting. 20 tablet 0   sertraline (ZOLOFT) 100 MG tablet Take 1.5 tablets (150 mg total) by mouth daily. 45 tablet 11   No current facility-administered medications on file prior to visit.    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)  Clotting disorder ([place])    Diabetes mellitus without complication ([place])    GERD (gastroesophageal reflux disease)    OCC   Homozygous for MTHFR gene mutation    Hypertension    Hypothyroidism    Multiple joint pain 09/04/2015   Obesity    Rosacea    Undiagnosed cardiac murmurs    Unspecified essential hypertension     Past Surgical History:  Procedure Laterality Date   APPENDECTOMY  2009   [facility]   CESAREAN SECTION  2016   CESAREAN SECTION N/A 11/15/2018   Procedure: REPEAT CESAREAN SECTION;  Surgeon: Anita Fellers, MD;  Location: [facility] ORS;  Service: Obstetrics;  Laterality: N/A;  Female born @ [birth year] Apgars: 8/9 Weight: 6lbs 10 ozs   CHOLECYSTECTOMY N/A 03/20/2016   Procedure: LAPAROSCOPIC CHOLECYSTECTOMY;  Surgeon: Anita Husbands, MD;   Location: [facility] ORS;  Service: General;  Laterality: N/A;   DILATION AND CURETTAGE OF UTERUS      Family History  Problem Relation Age of Onset   Mitral valve prolapse Mother    Arrhythmia Mother    Hypertension Mother    Diabetes Mother    Hypertension Father    Hyperlipidemia Father    Heart disease Maternal Grandfather    Lung cancer Maternal Grandfather    Cancer Maternal Grandfather    Diabetes Brother    Heart disease Maternal Aunt    Diabetes Maternal Aunt    Depression Maternal Aunt    Cancer Maternal Grandmother        ovarian melanoma   Diabetes Maternal Grandmother    Diabetes Maternal Uncle     Social History   Socioeconomic History   Marital status: Married    Spouse name: Anita Massey   Number of children: 3   Years of education: Not on file   Highest education level: Not on file  Occupational History   Occupation: Homemaker  Tobacco Use   Smoking status: Former    Packs/day: 0.25    Years: 1.00    Total pack years: 0.25    Types: Cigarettes    Quit date: 03/13/1998    Years since quitting: 23.6   Smokeless tobacco: Never  Vaping Use   Vaping Use: Never used  Substance and Sexual Activity   Alcohol use: No   Drug use: No   Sexual activity: Not Currently    Birth control/protection: I.U.D.    Comment: Mirena   Other Topics Concern   Not on file  Social History Narrative          Social Determinants of Health   Financial Resource Strain: Low Risk  (11/13/2018)   Overall Financial Resource Strain (CARDIA)    Difficulty of Paying Living Expenses: Not hard at all  Food Insecurity: No Food Insecurity (11/13/2018)   Hunger Vital Sign    Worried About Running Out of Food in the Last Year: Never true    Ran Out of Food in the Last Year: Never true  Transportation Needs: No Transportation Needs (11/13/2018)   PRAPARE - Hydrologist (Medical): No    Lack of Transportation (Non-Medical): No  Physical Activity: Inactive  (11/13/2018)   Exercise Vital Sign    Days of Exercise per Week: 0 days    Minutes of Exercise per Session: 0 min  Stress: Stress Concern Present (11/13/2018)   Anita Massey    Feeling of Stress : Very much  Social Connections: Unknown (11/13/2018)  Social Licensed conveyancer [NHANES]    Frequency of Communication with Friends and Family: More than three times a week    Frequency of Social Gatherings with Friends and Family: More than three times a week    Attends Religious Services: Patient refused    Active Member of Clubs or Organizations: Patient refused    Attends Archivist Meetings: Patient refused    Marital Status: Married  Human resources officer Violence: Not At Risk (11/13/2018)   Humiliation, Afraid, Rape, and Kick questionnaire    Fear of Current or Ex-Partner: No    Emotionally Abused: No    Physically Abused: No    Sexually Abused: No   Review of Systems Not very compliant with diet--- cooks and goes out Weight is stable Still drinks soda--but has cut out some Doesn't usually snack      Objective:   Physical Exam Constitutional:      Appearance: Normal appearance.  Neurological:     Mental Status: She is alert.  Psychiatric:        Mood and Affect: Mood normal.        Behavior: Behavior normal.            Assessment & Plan:

## 2021-10-23 ENCOUNTER — Encounter: Payer: Self-pay | Admitting: *Deleted

## 2021-10-31 ENCOUNTER — Encounter: Payer: Self-pay | Admitting: Internal Medicine

## 2021-10-31 MED ORDER — FREESTYLE LIBRE READER DEVI: 1.0000 | Freq: Once | 0 refills | Status: AC

## 2021-10-31 MED ORDER — FREESTYLE LIBRE 3 SENSOR MISC: 1.0000 | 12 refills | Status: DC

## 2021-11-13 DIAGNOSIS — I10 Essential (primary) hypertension: Secondary | ICD-10-CM | POA: Diagnosis not present

## 2021-11-13 DIAGNOSIS — E039 Hypothyroidism, unspecified: Secondary | ICD-10-CM | POA: Diagnosis not present

## 2021-11-13 DIAGNOSIS — E119 Type 2 diabetes mellitus without complications: Secondary | ICD-10-CM | POA: Diagnosis not present

## 2021-11-13 DIAGNOSIS — Z23 Encounter for immunization: Secondary | ICD-10-CM | POA: Diagnosis not present

## 2021-11-13 DIAGNOSIS — Z79899 Other long term (current) drug therapy: Secondary | ICD-10-CM | POA: Diagnosis not present

## 2021-11-13 DIAGNOSIS — Z203 Contact with and (suspected) exposure to rabies: Secondary | ICD-10-CM | POA: Diagnosis not present

## 2021-11-20 DIAGNOSIS — Z203 Contact with and (suspected) exposure to rabies: Secondary | ICD-10-CM | POA: Diagnosis not present

## 2021-11-20 DIAGNOSIS — Z2914 Encounter for prophylactic rabies immune globin: Secondary | ICD-10-CM | POA: Diagnosis not present

## 2021-11-23 ENCOUNTER — Telehealth: Payer: Self-pay | Admitting: Internal Medicine

## 2021-11-23 DIAGNOSIS — E1165 Type 2 diabetes mellitus with hyperglycemia: Secondary | ICD-10-CM | POA: Diagnosis not present

## 2021-11-23 DIAGNOSIS — E1159 Type 2 diabetes mellitus with other circulatory complications: Secondary | ICD-10-CM

## 2021-11-23 NOTE — Telephone Encounter (Signed)
Patient called and stated the [facility] does accept her insurance and wanted to know if Dr. Silvio Pate can express to them that it is urgent.

## 2021-11-23 NOTE — Telephone Encounter (Signed)
Spoke to pt. She said [facility] is not in network with her medicaid plan. Asking to be referred to [facility]. She is aware it will be a long time before she can get in.

## 2021-11-23 NOTE — Telephone Encounter (Signed)
Patient called and stated her insurance will not cover her Free Style Libre 2 and wanted to know Dr. Silvio Pate will send a prior authorization. Call back [phone].

## 2021-11-23 NOTE — Telephone Encounter (Signed)
Patient called to speak with Larene Beach

## 2021-11-23 NOTE — Telephone Encounter (Signed)
Called and spoke to pt. Advised her We never received a prior auth request. We do not have her insurance information to be able to start a PA. She will ask the pharmacy to run the rx again and generate a PA so it can be done.

## 2021-11-27 DIAGNOSIS — Z203 Contact with and (suspected) exposure to rabies: Secondary | ICD-10-CM | POA: Diagnosis not present

## 2021-11-27 DIAGNOSIS — Z2914 Encounter for prophylactic rabies immune globin: Secondary | ICD-10-CM | POA: Diagnosis not present

## 2021-12-10 ENCOUNTER — Telehealth: Payer: Self-pay | Admitting: Internal Medicine

## 2021-12-10 NOTE — Telephone Encounter (Signed)
Called pt's insurance and completed PA questions. Waiting on response. Spoke to pt to let her know it can be 24-48-hours to get a response back.

## 2021-12-10 NOTE — Telephone Encounter (Signed)
Pt called in requesting a call back stated she has questions and concern .[phone]

## 2021-12-10 NOTE — Telephone Encounter (Signed)
Spoke to pt. She said [facility] [phone] fax [phone]. We have not received anything from them and I do not see a note from the NT doing the PAs. If I have time, I will call them. In the meantime, she is Financial planner, again.

## 2021-12-10 NOTE — Telephone Encounter (Signed)
Pt called asking If Dr. Silvio Pate ever received that pre authorization request with the medicaid pharmacist? Pt is requesting a call back @ [phone]

## 2021-12-10 NOTE — Telephone Encounter (Signed)
Spoke to pt. She does not have an insurance card. I will try to call the insurance fax [phone] ph [phone].

## 2021-12-10 NOTE — Telephone Encounter (Signed)
error 

## 2021-12-12 NOTE — Telephone Encounter (Addendum)
Spoke to pt. She is asking for the appeal to be done. Said we needed to put her most recent lab results in the letter. Denial info placed in Dr Alla German Inbox on his desk.  She also wants a referral to [facility] as [facility] does not take her medicaid. I advised her that I thought they were not doing referrals right now. But she wants it sent anyway.

## 2021-12-12 NOTE — Telephone Encounter (Signed)
Ria Comment, I have the denial information. I will bring it to you. Can you help?

## 2021-12-12 NOTE — Telephone Encounter (Signed)
Pt called back requesting to speak to [facility]. Told pt Anita Massey's response about awaiting a response from her insurance. Pt stated she's herd back from insurance & everything was denied. Pt requested a call back @ [phone]

## 2021-12-12 NOTE — Telephone Encounter (Signed)
Reviewed denial letter. Unfortunately [place] Medicaid requires patients to be on 2 or more insulin injections per day (Basal + Bolus insulin) to qualify for CGM. Since she is only on basal insulin once a day, she does not qualify.

## 2021-12-12 NOTE — Addendum Note (Signed)
Addended by: Viviana Simpler I on: 12/12/2021 01:11 PM   Modules accepted: Orders

## 2021-12-13 MED ORDER — ACCU-CHEK GUIDE CONTROL VI LIQD: 1.0000 | Freq: Once | 3 refills | Status: AC

## 2021-12-13 MED ORDER — ACCU-CHEK SOFTCLIX LANCET DEV KIT: 1.0000 | PACK | Freq: Once | 0 refills | Status: AC

## 2021-12-13 MED ORDER — ACCU-CHEK GUIDE W/DEVICE KIT: 1.0000 | PACK | Freq: Once | 0 refills | Status: AC

## 2021-12-13 MED ORDER — ACCU-CHEK GUIDE VI STRP: ORAL_STRIP | 12 refills | Status: DC

## 2021-12-13 MED ORDER — ACCU-CHEK SOFTCLIX LANCETS MISC
12 refills | Status: AC
Start: 2021-12-13 — End: ?

## 2021-12-13 NOTE — Addendum Note (Signed)
Addended by: Pilar Grammes on: 12/13/2021 04:13 PM   Modules accepted: Orders

## 2021-12-13 NOTE — Telephone Encounter (Signed)
Spoke to pt. Advised her I sent in the Accu-chek Guide meter and strips.

## 2021-12-13 NOTE — Telephone Encounter (Signed)
Thank you. I told her the same thing when I spoke to her yesterday. I called her back to let her know an appeal will not be done. I will send a glucometer to [facility] for her.

## 2021-12-28 ENCOUNTER — Telehealth: Payer: Self-pay

## 2021-12-28 ENCOUNTER — Encounter: Payer: Self-pay | Admitting: Internal Medicine

## 2021-12-28 ENCOUNTER — Ambulatory Visit (INDEPENDENT_AMBULATORY_CARE_PROVIDER_SITE_OTHER): Payer: Medicaid Other | Admitting: Internal Medicine

## 2021-12-28 VITALS — BP 108/78 | HR 77 | Ht 63.0 in | Wt 157.2 lb

## 2021-12-28 DIAGNOSIS — E1165 Type 2 diabetes mellitus with hyperglycemia: Secondary | ICD-10-CM | POA: Diagnosis not present

## 2021-12-28 DIAGNOSIS — E1142 Type 2 diabetes mellitus with diabetic polyneuropathy: Secondary | ICD-10-CM | POA: Diagnosis not present

## 2021-12-28 DIAGNOSIS — E785 Hyperlipidemia, unspecified: Secondary | ICD-10-CM

## 2021-12-28 LAB — POCT GLYCOSYLATED HEMOGLOBIN (HGB A1C): Hemoglobin A1C: 9.5 % — AB (ref 4.0–5.6)

## 2021-12-28 MED ORDER — FREESTYLE LIBRE 2 SENSOR MISC: 1.0000 | 3 refills | Status: DC

## 2021-12-28 MED ORDER — LYUMJEV KWIKPEN 100 UNIT/ML ~~LOC~~ SOPN: 8.0000 [IU] | PEN_INJECTOR | Freq: Three times a day (TID) | SUBCUTANEOUS | 3 refills | Status: DC

## 2021-12-28 MED ORDER — FREESTYLE LIBRE 2 READER DEVI: 1.0000 | Freq: Every day | 3 refills | Status: DC

## 2021-12-28 MED ORDER — INSULIN PEN NEEDLE 32G X 4 MM MISC: 3 refills | Status: DC

## 2021-12-28 NOTE — Telephone Encounter (Signed)
Patient LVM stating that her [place], [place] and [place] require a PA. Are you all able to check on this? Thank you!

## 2021-12-28 NOTE — Addendum Note (Signed)
Addended by: Sarina Ill on: 12/28/2021 04:12 PM   Modules accepted: Orders

## 2021-12-28 NOTE — Patient Instructions (Addendum)
Please continue: - Farxiga 10 mg before breakfast - Trulicity 4.19 mg weekly  Stop: - Glipizide  Please change: - Lantus 30 units at night  Start: - Lyumjev 8-10 units before the 3 meals of the day  Try to get the CGM.  Please return in 1.5 months.  PATIENT INSTRUCTIONS FOR TYPE 2 DIABETES:  **Please join MyChart!** - see attached instructions about how to join if you have not done so already.  DIET AND EXERCISE Diet and exercise is an important part of diabetic treatment.  We recommended aerobic exercise in the form of brisk walking (working between 40-60% of maximal aerobic capacity, similar to brisk walking) for 150 minutes per week (such as 30 minutes five days per week) along with 3 times per week performing 'resistance' training (using various gauge rubber tubes with handles) 5-10 exercises involving the major muscle groups (upper body, lower body and core) performing 10-15 repetitions (or near fatigue) each exercise. Start at half the above goal but build slowly to reach the above goals. If limited by weight, joint pain, or disability, we recommend daily walking in a swimming pool with water up to waist to reduce pressure from joints while allow for adequate exercise.    BLOOD GLUCOSES Monitoring your blood glucoses is important for continued management of your diabetes. Please check your blood glucoses 2-4 times a day: fasting, before meals and at bedtime (you can rotate these measurements - e.g. one day check before the 3 meals, the next day check before 2 of the meals and before bedtime, etc.).   HYPOGLYCEMIA (low blood sugar) Hypoglycemia is usually a reaction to not eating, exercising, or taking too much insulin/ other diabetes drugs.  Symptoms include tremors, sweating, hunger, confusion, headache, etc. Treat IMMEDIATELY with 15 grams of Carbs: 4 glucose tablets  cup regular juice/soda 2 tablespoons raisins 4 teaspoons sugar 1 tablespoon honey Recheck blood glucose  in 15 mins and repeat above if still symptomatic/blood glucose <100.  RECOMMENDATIONS TO REDUCE YOUR RISK OF DIABETIC COMPLICATIONS: * Take your prescribed MEDICATION(S) * Follow a DIABETIC diet: Complex carbs, fiber rich foods, (monounsaturated and polyunsaturated) fats * AVOID saturated/trans fats, high fat foods, >2,300 mg salt per day. * EXERCISE at least 5 times a week for 30 minutes or preferably daily.  * DO NOT SMOKE OR DRINK more than 1 drink a day. * Check your FEET every day. Do not wear tightfitting shoes. Contact us if you develop an ulcer * See your EYE doctor once a year or more if needed * Get a FLU shot once a year * Get a PNEUMONIA vaccine once before and once after age 80 years  GOALS:  * Your Hemoglobin A1c of <7%  * fasting sugars need to be 80-130 * after meals sugars need to be <180 (2h after you start eating) * Your Systolic BP should be 622 or lower  * Your Diastolic BP should be 80 or lower  * Your HDL (Good Cholesterol) should be 40 or higher  * Your LDL (Bad Cholesterol) should be ideally <70. * Your Triglycerides should be 150 or lower  * Your Urine microalbumin (kidney function) should be <30 * Your Body Mass Index should be 25 or lower   Please consider the following ways to cut down carbs and fat and increase fiber and micronutrients in your diet: - substitute whole grain for white bread or pasta - substitute brown rice for white rice - substitute 90-calorie flat bread pieces for slices of bread  when possible - substitute sweet potatoes or yams for white potatoes - substitute humus for margarine - substitute tofu for cheese when possible - substitute almond or rice milk for regular milk (would not drink soy milk daily due to concern for soy estrogen influence on breast cancer risk) - substitute dark chocolate for other sweets when possible - substitute water - can add lemon or orange slices for taste - for diet sodas (artificial sweeteners will trick  your body that you can eat sweets without getting calories and will lead you to overeating and weight gain in the long run) - do not skip breakfast or other meals (this will slow down the metabolism and will result in more weight gain over time)  - can try smoothies made from fruit and almond/rice milk in am instead of regular breakfast - can also try old-fashioned (not instant) oatmeal made with almond/rice milk in am - order the dressing on the side when eating salad at a restaurant (pour less than half of the dressing on the salad) - eat as little meat as possible - can try juicing, but should not forget that juicing will get rid of the fiber, so would alternate with eating raw veg./fruits or drinking smoothies - use as little oil as possible, even when using olive oil - can dress a salad with a mix of balsamic vinegar and lemon juice, for e.g. - use agave nectar, stevia sugar, or regular sugar rather than artificial sweateners - steam or broil/roast veggies  - snack on veggies/fruit/nuts (unsalted, preferably) when possible, rather than processed foods - reduce or eliminate aspartame in diet (it is in diet sodas, chewing gum, etc) Read the labels!  Try to read Dr. Janene Harvey book: "Program for Reversing Diabetes" for other ideas for healthy eating.

## 2021-12-28 NOTE — Progress Notes (Signed)
Patient ID: Anita Massey, female   DOB: 05-31-1980, 41 y.o.   MRN: [id number]  HPI: Anita Massey is a 41 y.o.-year-old female, self-referred, for management of DM2, dx in 2020, insulin-dependent since 2022, uncontrolled, with long term complications (PN).  Her mother [name]) is also my patient. She previously saw Dr. Meredith Pel.  Reviewed HbA1c: 11/23/2021 (records from [place] accessed by pt. During the appt.): HbA1c calculated from fructosamine: 8.1% Lab Results  Component Value Date   HGBA1C 9.9 (A) 10/19/2021   HGBA1C 8.9 (A) 05/11/2021   HGBA1C 9.7 (A) 02/16/2021   HGBA1C 9.3 (A) 08/28/2020   HGBA1C 9.9 (H) 03/28/2020   HGBA1C 5.9 12/26/2017   HGBA1C 5.5 02/10/2013   Pt is on a regimen of: - Glipizide XL 10 mg twice a day - Farxiga 10 mg before breakfast - Trulicity 3.74 mg weekly - Lantus 15 >> 20 >> 25 >> 35 >> 40 units  in am (for the last week) or at bedtime She was not able to tolerate metformin due to nausea but also tingling. She was not able to tolerate higher doses of Trulicity due to stool incontinence. She would not want to be on an insulin pump, like her mom, if possible.  Pt checks her sugars 0-1x a day now and they are: - am: 161 - 2h after b'fast: n/c - before lunch: n/c - 2h after lunch: 414, 438 - before dinner: 207, 368 - 2h after dinner: n/c - bedtime: 325,  391, 405 - nighttime: n/c  She did wear a CGM until recenty:  Lowest sugar was 77 3 mo ago; hypoglycemia awareness at 100. Highest sugar was 600s.  Glucometer: Accu-Chek guide  Pt's meals are: - Breakfast: oatmeal, cereal, eggs - Lunch: sandwich, pizza - Dinner: chicken or other meat, veggies, salad, baked potatoes - Snacks: 2 Stopped sweet tea and sodas  -only has these rarely now.  - no CKD, last BUN/creatinine:  11/23/2021: 17/0.62, GFR 114, Glu 171 Lab Results  Component Value Date   BUN 15 08/28/2020   BUN 11 03/28/2020   CREATININE 0.68 08/28/2020   CREATININE 0.64  03/28/2020  She is not on ACE inhibitor/ARB.  On Farxiga.  -+ HL; last set of lipids: Lab Results  Component Value Date   CHOL 208 (H) 03/28/2020   HDL 34.90 (L) 03/28/2020   LDLCALC 148 (H) 03/28/2020   TRIG 129.0 03/28/2020   CHOLHDL 6 03/28/2020  She is not on a statin.  - last eye exam was in >1 year ago  - before DM diagnosis.   - + numbness and tingling in her feet.  Pt has FH of DM in mother (who is also my patient), and maternal side of the family.  She has increased urination, nocturia >3x a night, blurry vision, weight loss. She has a history of yeast infections for which she intermittently takes  Diflucan. She also has history of HTN, microscopic hematuria, elevated liver enzymes, GERD, obesity (in the past, she was weighing over 200 pounds), anemia, rosacea, homozygosity of MTHFR variant, anxiety.  ROS: Constitutional: + See HPI Eyes: + blurry vision ENT: no sore throat, no dysphagia, no odynophagia, no hoarseness, no tinnitus, no hypoacusis Cardiovascular: no CP, no SOB, + palpitations, no leg swelling Respiratory: no cough, no SOB, no wheezing Gastrointestinal: + N, no V, + D, + C, no acid reflux Musculoskeletal: + muscle and joint aches Skin: no rash, + hair loss, + itching, + easy bruising Neurological: no tremors, no numbness or  tingling/no dizziness/+ HAs + Low libido  Past Medical History:  Diagnosis Date   Anemia    Anxiety state, unspecified    Blood dyscrasia    PROTHROMBIN G 17408 HETEROZYGOSITY (FACTOR 2)   Clotting disorder (HCC)    Diabetes mellitus without complication (HCC)    GERD (gastroesophageal reflux disease)    OCC   Homozygous for MTHFR gene mutation    Hypertension    Hypothyroidism    Multiple joint pain 09/04/2015   Obesity    Rosacea    Undiagnosed cardiac murmurs    Unspecified essential hypertension    Past Surgical History:  Procedure Laterality Date   APPENDECTOMY  2009   [facility]   CESAREAN SECTION  2016   CESAREAN  SECTION N/A 11/15/2018   Procedure: REPEAT CESAREAN SECTION;  Surgeon: Homero Fellers, MD;  Location: [facility] ORS;  Service: Obstetrics;  Laterality: N/A;  Female born @ [birth year] Apgars: 8/9 Weight: 6lbs 10 ozs   CHOLECYSTECTOMY N/A 03/20/2016   Procedure: LAPAROSCOPIC CHOLECYSTECTOMY;  Surgeon: Jules Husbands, MD;  Location: [facility] ORS;  Service: General;  Laterality: N/A;   DILATION AND CURETTAGE OF UTERUS     Social History   Socioeconomic History   Marital status: Married    Spouse name: Harrell Gave   Number of children: 3   Years of education: Not on file   Highest education level: Not on file  Occupational History   Occupation: Homemaker  Tobacco Use   Smoking status: Former    Packs/day: 0.25    Years: 1.00    Total pack years: 0.25    Types: Cigarettes    Quit date: 03/13/1998    Years since quitting: 23.8   Smokeless tobacco: Never  Vaping Use   Vaping Use: Never used  Substance and Sexual Activity   Alcohol use: No   Drug use: No   Sexual activity: Not Currently    Birth control/protection: I.U.D.    Comment: Mirena   Other Topics Concern   Not on file  Social History Narrative          Social Determinants of Health   Financial Resource Strain: Low Risk  (11/13/2018)   Overall Financial Resource Strain (CARDIA)    Difficulty of Paying Living Expenses: Not hard at all  Food Insecurity: No Food Insecurity (11/13/2018)   Hunger Vital Sign    Worried About Running Out of Food in the Last Year: Never true    Ran Out of Food in the Last Year: Never true  Transportation Needs: No Transportation Needs (11/13/2018)   PRAPARE - Hydrologist (Medical): No    Lack of Transportation (Non-Medical): No  Physical Activity: Inactive (11/13/2018)   Exercise Vital Sign    Days of Exercise per Week: 0 days    Minutes of Exercise per Session: 0 min  Stress: Stress Concern Present (11/13/2018)   [place]    Feeling of Stress : Very much  Social Connections: Unknown (11/13/2018)   Social Connection and Isolation Panel [NHANES]    Frequency of Communication with Friends and Family: More than three times a week    Frequency of Social Gatherings with Friends and Family: More than three times a week    Attends Religious Services: Patient refused    Active Member of Clubs or Organizations: Patient refused    Attends Archivist Meetings: Patient refused    Marital Status: Married  Intimate  Partner Violence: Not At Risk (11/13/2018)   Humiliation, Afraid, Rape, and Kick questionnaire    Fear of Current or Ex-Partner: No    Emotionally Abused: No    Physically Abused: No    Sexually Abused: No   Current Outpatient Medications on File Prior to Visit  Medication Sig Dispense Refill   Accu-Chek Softclix Lancets lancets Use to check blood sugar 3-4 times daily 100 each 12   albuterol (VENTOLIN HFA) 108 (90 Base) MCG/ACT inhaler Inhale 2 puffs into the lungs every 6 (six) hours as needed for wheezing or shortness of breath. 8 g 0   ALPRAZolam (XANAX) 0.25 MG tablet Take 1 tablet (0.25 mg total) by mouth 2 (two) times daily as needed for anxiety. 30 tablet 0   amLODipine (NORVASC) 10 MG tablet Take 1 tablet by mouth once daily 90 tablet 3   dapagliflozin propanediol (FARXIGA) 10 MG TABS tablet Take 1 tablet (10 mg total) by mouth daily before breakfast. 30 tablet 11   Dulaglutide (TRULICITY) 3.71 GG/2.6RS SOPN Inject 0.75 mg into the skin once a week. 2 mL 2   fluconazole (DIFLUCAN) 150 MG tablet Take 1 tablet (150 mg total) by mouth once a week. As needed 4 tablet 2   fluticasone (FLONASE) 50 MCG/ACT nasal spray Place 2 sprays into both nostrils daily. 16 g 0   glipiZIDE (GLUCOTROL XL) 10 MG 24 hr tablet Take 1 tablet (10 mg total) by mouth in the morning and at bedtime. 180 tablet 3   glucose blood (ACCU-CHEK GUIDE) test strip Use to check blood sugar 3-4 times daily 100  each 12   insulin glargine (LANTUS) 100 UNIT/ML injection Inject 0.3-0.4 mLs (30-40 Units total) into the skin daily. 10 mL 11   ketoconazole (NIZORAL) 2 % cream Apply 1 application topically in the morning and at bedtime. 60 g 5   ondansetron (ZOFRAN ODT) 4 MG disintegrating tablet Take 1 tablet (4 mg total) by mouth every 8 (eight) hours as needed for nausea or vomiting. 20 tablet 0   sertraline (ZOLOFT) 100 MG tablet Take 1.5 tablets (150 mg total) by mouth daily. 45 tablet 11   No current facility-administered medications on file prior to visit.   Allergies  Allergen Reactions   Biaxin [Clarithromycin] Hives   Metformin And Related Nausea And Vomiting    Also bad tingling   Toradol [Ketorolac Tromethamine]     Chest pain    Prednisone Anxiety and Palpitations    "Makes me feel crazy"   Morphine And Related     Chest Pain    Trulicity [Dulaglutide] Diarrhea    Bowel incontinence on high dose, tolerating 0.75 mg ok per patient   Tape Rash    Paper tape is fine   Family History  Problem Relation Age of Onset   Mitral valve prolapse Mother    Arrhythmia Mother    Hypertension Mother    Diabetes Mother    Hypertension Father    Hyperlipidemia Father    Heart disease Maternal Grandfather    Lung cancer Maternal Grandfather    Cancer Maternal Grandfather    Diabetes Brother    Heart disease Maternal Aunt    Diabetes Maternal Aunt    Depression Maternal Aunt    Cancer Maternal Grandmother        ovarian melanoma   Diabetes Maternal Grandmother    Diabetes Maternal Uncle    PE: BP 108/78 (BP Location: Left Arm, Patient Position: Sitting, Cuff Size: Normal)   Pulse  77   Ht '5\' 3"'$  (1.6 m)   Wt 157 lb 3.2 oz (71.3 kg)   SpO2 96%   BMI 27.85 kg/m  Wt Readings from Last 3 Encounters:  12/28/21 157 lb 3.2 oz (71.3 kg)  10/19/21 157 lb (71.2 kg)  05/11/21 154 lb (69.9 kg)   Constitutional: normal weight, in NAD Eyes: EOMI, no exophthalmos ENT: no thyromegaly, no cervical  lymphadenopathy Cardiovascular: RRR, No MRG Respiratory: CTA B Musculoskeletal: no deformities Skin: no rashes Neurological: + Mild tremor with outstretched hands  ASSESSMENT: 1. DM2, insulin-dependent, uncontrolled, with complications -Peripheral neuropathy  2. HL  PLAN:  1. Patient with uncontrolled diabetes, on oral antidiabetic regimen, low-dose GLP-1 receptor agonist, and long-acting insulin regimen, which became insufficient.  Latest HbA1c was 9.9% in 10/2021.  At today's visit, we repeated her HbA1c and this is 9.5% (slightly lower). CGM interpretation: -We reviewed her CGM tracings from last month - she had her grandmother's CGM attached: -It appears that 16% of values are in target range (goal >70%), while 84% are higher than 180 (goal <25%), and 0% are lower than 70 (goal <4%).  The calculated average blood sugar is 273.   -Reviewing the CGM trends, sugars appear to be fluctuating around the upper limit of the target range overnight, but they increased abruptly after the first meal of the day and continue to increase with every meal, with most of the blood sugars around dinnertime being in the upper 200s up to 400s. -She tells me that she is taking Lantus sometimes in the morning and sometimes at night.  We discussed that this is taken usually at night.  She did increase the dose by herself in the last month, to improve her blood sugars. -Due to the stepwise increase in blood sugars throughout the day, we discussed about the need to add mealtime insulin.  I advised her how to vary the dose based on the size and consistency of her meals.  Will try to use Lyumjev, which is injected at the start of the meal, rather than 15 minutes before.  She agrees with this plan.  I advised her that we can work with any insulin covered by her insurance, but Lyumjev (for [employer]) will give her more flexibility. -I did advise her to stay with a lower dose of Lantus if she added mealtime insulin, but to  titrate dose up if sugars do not improve significantly afterwards. -For now, I advised her to stop glipizide but will continue with [place] and the low-dose Trulicity.  At next visit, I plan to switch her from Trulicity to [employer], if tolerable and affordable. -I strongly advised her against drinking ANY sweet drinks. -We discussed at this visit that we will need to check her for type 1 diabetes.  I do suspect a degree of insulin deficiency based on the pattern of blood sugars and the lean body habitus.  However, sugars during the visit were in the 300s so we plan to check this at next visit. - I suggested to:  Patient Instructions  Please continue: - Farxiga 10 mg before breakfast - Trulicity 4.09 mg weekly  Stop: - Glipizide  Please change: - Lantus 30 units at night  Start: - Lyumjev 8-10 units before the 3 meals of the day  Try to get the CGM.  Please return in 1.5 months.  - Strongly advised her to start checking sugars at different times of the day - check 4x a day, rotating checks - CBG targets for  treatment: 80-130 mg/dL before meals and <180 mg/dL after meals; target HbA1c <7%. - given foot care handout   - given instructions for hypoglycemia management "15-15 rule"  - advised for yearly eye exams >> she did not have any appointment after her diabetes diagnosis.  She definitely needs one. - Return to clinic in 3 mo  2.HL - Reviewed latest lipid panel from 03/2020: LDL above target, HDL low, triglycerides normal: Lab Results  Component Value Date   CHOL 208 (H) 03/28/2020   HDL 34.90 (L) 03/28/2020   LDLCALC 148 (H) 03/28/2020   TRIG 129.0 03/28/2020   CHOLHDL 6 03/28/2020  - she is not on a statin - Plan to recheck at next visit, when sugars hopefully better controlled  Philemon Kingdom, MD PhD [facility] Endocrinology

## 2021-12-31 ENCOUNTER — Telehealth: Payer: Self-pay

## 2021-12-31 ENCOUNTER — Other Ambulatory Visit (HOSPITAL_COMMUNITY): Payer: Self-pay

## 2021-12-31 NOTE — Telephone Encounter (Signed)
Pharmacy Patient Advocate Encounter  Received notification from [facility]   that the request for prior authorization for FREESTYLE LIBRE 2 SENSOR  has been denied due to   .    PLEASE ADVISE  Karie Soda, CPhT Pharmacy Patient Advocate Specialist Direct Number: [phone] Fax: [phone]

## 2021-12-31 NOTE — Telephone Encounter (Signed)
Yes, starting PA's now. Will update chart with notes.

## 2021-12-31 NOTE — Telephone Encounter (Signed)
Pharmacy Patient Advocate Encounter  Received notification from [name]   that the request for prior authorization for  FREESTYLE LIBRE 2 RECEIVER  has been denied due to       [place], [place] Patient Advocate Specialist Direct Number: [phone] Fax: [phone]

## 2021-12-31 NOTE — Telephone Encounter (Signed)
Pharmacy Patient Advocate Encounter  Received notification from [facility]   that the request for prior authorization for LYUMJEV 100 UNIT/ML PEN  has been denied due to NON PREFERRED, PT NEEDS TO TRY AND FAIL ONE OF PREFERRED DRUGS.       PLEASE ADVISE   Karie Soda, CPhT Pharmacy Patient Advocate Specialist Direct Number: [phone] Fax: [phone]

## 2021-12-31 NOTE — Telephone Encounter (Signed)
Pharmacy Patient Advocate Encounter   Received notification from [facility] that prior authorization for FREESTYLE LIBRE 2 SENSOR is needed.    PA submitted on 12/31/21 VIA PERFORM RX CASE [id number] Status is pending  Karie Soda, [place] Patient Advocate Specialist Direct Number: [phone] Fax: [phone]

## 2021-12-31 NOTE — Telephone Encounter (Signed)
Pharmacy Patient Advocate Encounter   Received notification from [facility] that prior authorization for FREESTYLE LIBRE 2 RECEIVER  is needed.    PA submitted on 12/31/21 VIA PERFORM RX CASE ID: [id number] Status is pending  Karie Soda, [place] Patient Advocate Specialist Direct Number: [phone] Fax: [phone]

## 2021-12-31 NOTE — Telephone Encounter (Signed)
Pharmacy Patient Advocate Encounter   Received notification from [facility] that prior authorization for LYUMJEV 100 UNIT/ML PEN is needed.    PA submitted on 12/31/21 VIA PERFORM RX CASE ID: [id number] Status is pending  Karie Soda, [place] Patient Advocate Specialist Direct Number: [phone] Fax: [phone]

## 2022-01-01 ENCOUNTER — Encounter: Payer: Self-pay | Admitting: Internal Medicine

## 2022-01-02 ENCOUNTER — Other Ambulatory Visit (HOSPITAL_COMMUNITY): Payer: Self-pay

## 2022-01-02 ENCOUNTER — Other Ambulatory Visit: Payer: Self-pay | Admitting: Internal Medicine

## 2022-01-02 MED ORDER — INSULIN LISPRO (1 UNIT DIAL) 100 UNIT/ML (KWIKPEN): 8.0000 [IU] | PEN_INJECTOR | Freq: Three times a day (TID) | SUBCUTANEOUS | 3 refills | Status: DC

## 2022-01-02 NOTE — Telephone Encounter (Signed)
Submitting a new PA, see chart notes for updates.

## 2022-01-02 NOTE — Telephone Encounter (Signed)
OK, I sent Humalog.

## 2022-01-03 ENCOUNTER — Other Ambulatory Visit (HOSPITAL_COMMUNITY): Payer: Self-pay

## 2022-01-03 ENCOUNTER — Telehealth: Payer: Self-pay

## 2022-01-03 NOTE — Telephone Encounter (Signed)
Pharmacy Patient Advocate Encounter   Received notification from [facility] that prior authorization for FREESTYLE LIBRE 2 RECEIVER  is needed.    PA submitted on 01/03/22 VIA PERFORM RX CASE [id number] Status is pending  Karie Soda, CPhT Pharmacy Patient Advocate Specialist Direct Number: [phone] Fax: [phone]

## 2022-01-03 NOTE — Telephone Encounter (Signed)
Submitting new PA with patient insulin change. See chart notes for update.

## 2022-01-03 NOTE — Telephone Encounter (Signed)
Pharmacy Patient Advocate Encounter   Received notification from [facility] that prior authorization for FREESTYLE LIBRE 2 SENSOR is needed.    PA submitted on 01/03/22 VIA PERFORM RX CASE ID: [id number] Status is pending  Karie Soda, [place] Patient Advocate Specialist Direct Number: [phone] Fax: [phone]

## 2022-01-09 ENCOUNTER — Other Ambulatory Visit (HOSPITAL_COMMUNITY): Payer: Self-pay

## 2022-01-10 ENCOUNTER — Other Ambulatory Visit (HOSPITAL_COMMUNITY): Payer: Self-pay

## 2022-01-10 NOTE — Telephone Encounter (Signed)
PA for FreeStyle Libre 2 Sensor has been APPROVED from 01/09/2022-07/11/2022.  Approval letter indexed to patient documents.

## 2022-01-10 NOTE — Telephone Encounter (Signed)
Pharmacy Patient Advocate Encounter  Prior Authorization for [place] 2 SENSOR has been approved.    Effective dates: 01/09/22 through 07/11/22  Spoke with Pharmacy to process.  Karie Soda, CPhT Pharmacy Patient Advocate Specialist Direct Number: [phone] Fax: [phone]

## 2022-01-10 NOTE — Telephone Encounter (Signed)
Pharmacy Patient Advocate Encounter  Prior Authorization for FREESTYLE LIBRE 2 RECEIVER has been approved.    Effective dates: 01/09/22 through 07/11/22  Spoke with Pharmacy to process.  Karie Soda, CPhT Pharmacy Patient Advocate Specialist Direct Number: [phone] Fax: [phone]

## 2022-01-21 ENCOUNTER — Ambulatory Visit: Payer: Medicaid Other | Admitting: Internal Medicine

## 2022-02-15 ENCOUNTER — Other Ambulatory Visit (HOSPITAL_COMMUNITY): Payer: Self-pay

## 2022-02-28 ENCOUNTER — Encounter: Payer: Self-pay | Admitting: Internal Medicine

## 2022-02-28 ENCOUNTER — Ambulatory Visit (INDEPENDENT_AMBULATORY_CARE_PROVIDER_SITE_OTHER): Payer: Medicaid Other | Admitting: Internal Medicine

## 2022-02-28 VITALS — BP 120/88 | HR 88 | Ht 63.0 in | Wt 162.6 lb

## 2022-02-28 DIAGNOSIS — E1165 Type 2 diabetes mellitus with hyperglycemia: Secondary | ICD-10-CM | POA: Diagnosis not present

## 2022-02-28 DIAGNOSIS — E785 Hyperlipidemia, unspecified: Secondary | ICD-10-CM | POA: Diagnosis not present

## 2022-02-28 DIAGNOSIS — B3731 Acute candidiasis of vulva and vagina: Secondary | ICD-10-CM | POA: Diagnosis not present

## 2022-02-28 DIAGNOSIS — E1142 Type 2 diabetes mellitus with diabetic polyneuropathy: Secondary | ICD-10-CM | POA: Diagnosis not present

## 2022-02-28 LAB — POCT GLYCOSYLATED HEMOGLOBIN (HGB A1C): Hemoglobin A1C: 9.7 % — AB (ref 4.0–5.6)

## 2022-02-28 LAB — LIPID PANEL
Cholesterol: 265 mg/dL — ABNORMAL HIGH (ref 0–200)
HDL: 51.1 mg/dL (ref >39.00)
LDL Cholesterol: 190 mg/dL — ABNORMAL HIGH (ref 0–99)
NonHDL: 213.43
Total CHOL/HDL Ratio: 5
Triglycerides: 117 mg/dL (ref 0.0–149.0)
VLDL: 23.4 mg/dL (ref 0.0–40.0)

## 2022-02-28 MED ORDER — TIRZEPATIDE 2.5 MG/0.5ML ~~LOC~~ SOAJ: 2.5000 mg | SUBCUTANEOUS | 3 refills | Status: DC

## 2022-02-28 MED ORDER — ATORVASTATIN CALCIUM 40 MG PO TABS: 40.0000 mg | ORAL_TABLET | Freq: Every day | ORAL | 3 refills | Status: DC

## 2022-02-28 MED ORDER — FLUCONAZOLE 150 MG PO TABS: 150.0000 mg | ORAL_TABLET | ORAL | 1 refills | Status: DC

## 2022-02-28 NOTE — Progress Notes (Addendum)
Patient ID: Anita Massey, female   DOB: 1980-06-06, 42 y.o.   MRN: [id number]  HPI: Anita Massey is a 42 y.o.-year-old female, self-referred, for management of DM2, dx in 2020, insulin-dependent since 2022, uncontrolled, with long term complications (PN).  Her mother Anita Massey) is also my patient.  Last visit 2 months ago. She previously saw Dr. Meredith Pel.  Interim history: No increased urination, blurry vision, nausea, chest pain. She has a vaginal yeast infection.  Sugars have been higher in the last week.  Reviewed HbA1c: Lab Results  Component Value Date   HGBA1C 9.5 (A) 12/28/2021   HGBA1C 9.9 (A) 10/19/2021   HGBA1C 8.9 (A) 05/11/2021   HGBA1C 9.7 (A) 02/16/2021   HGBA1C 9.3 (A) 08/28/2020   HGBA1C 9.9 (H) 03/28/2020   HGBA1C 5.9 12/26/2017   HGBA1C 5.5 02/10/2013  11/23/2021 (records from [place] accessed by pt. During the appt.): HbA1c calculated from fructosamine: 8.1%  Pt is on a regimen of: - Glipizide XL 10 mg twice a day >> Lyumjev >> Humalog 7 to 14 units before every meal - Farxiga 10 mg before breakfast - Trulicity A999333 mg weekly - Lantus 15 >> 20 >> 25 >> 35 >> 40 units  in am (for the last week) or at bedtime >> 30 units at bedtime She was not able to tolerate metformin due to nausea but also tingling. She was not able to tolerate higher doses of Trulicity due to stool incontinence. She would not want to be on an insulin pump, like her mom, if possible.  She is sugars more than 4 times a day with her freestyle libre CGM -approved by her insurance:  Prev.:  Lowest sugar was 77 >> 60s; hypoglycemia awareness at 100. Highest sugar was 600s >> HI.  Glucometer: Accu-Chek guide  Pt's meals are: - Breakfast: oatmeal, cereal, eggs - Lunch: sandwich, pizza - Dinner: chicken or other meat, veggies, salad, baked potatoes - Snacks: 2 Stopped sweet tea and sodas  -only has these rarely now.  - no CKD, last BUN/creatinine:  11/23/2021: 17/0.62, GFR 114, Glu  171 Lab Results  Component Value Date   BUN 15 08/28/2020   BUN 11 03/28/2020   CREATININE 0.68 08/28/2020   CREATININE 0.64 03/28/2020  She is not on ACE inhibitor/ARB.  On Farxiga.  -+ HL; last set of lipids: Lab Results  Component Value Date   CHOL 208 (H) 03/28/2020   HDL 34.90 (L) 03/28/2020   LDLCALC 148 (H) 03/28/2020   TRIG 129.0 03/28/2020   CHOLHDL 6 03/28/2020  She is not on a statin.  - last eye exam was in >1 year ago  - before DM diagnosis.   - + numbness and tingling in her feet.  Pt has FH of DM in mother (who is also my patient), and maternal side of the family.  She has increased urination, nocturia >3x a night, blurry vision, weight loss. She has a history of yeast infections for which she intermittently takes  Diflucan. She also has history of HTN, microscopic hematuria, elevated liver enzymes, GERD, obesity (in the past, she was weighing over 200 pounds), anemia, rosacea, homozygosity of MTHFR variant, anxiety.  ROS: + See HPI  Past Medical History:  Diagnosis Date   Anemia    Anxiety state, unspecified    Blood dyscrasia    PROTHROMBIN G 65784 HETEROZYGOSITY (FACTOR 2)   Clotting disorder (HCC)    Diabetes mellitus without complication (HCC)    GERD (gastroesophageal reflux disease)  [place]   Homozygous for MTHFR gene mutation    Hypertension    Hypothyroidism    Multiple joint pain 09/04/2015   Obesity    Rosacea    Undiagnosed cardiac murmurs    Unspecified essential hypertension    Past Surgical History:  Procedure Laterality Date   APPENDECTOMY  2009   [facility]   CESAREAN SECTION  2016   CESAREAN SECTION N/A 11/15/2018   Procedure: REPEAT CESAREAN SECTION;  Surgeon: Homero Fellers, MD;  Location: [facility] ORS;  Service: Obstetrics;  Laterality: N/A;  Female born @ [birth year] Apgars: 8/9 Weight: 6lbs 10 ozs   CHOLECYSTECTOMY N/A 03/20/2016   Procedure: LAPAROSCOPIC CHOLECYSTECTOMY;  Surgeon: Jules Husbands, MD;  Location: [facility] ORS;  Service:  General;  Laterality: N/A;   DILATION AND CURETTAGE OF UTERUS     Social History   Socioeconomic History   Marital status: Married    Spouse name: Harrell Gave   Number of children: 3   Years of education: Not on file   Highest education level: Not on file  Occupational History   Occupation: Homemaker  Tobacco Use   Smoking status: Former    Packs/day: 0.25    Years: 1.00    Total pack years: 0.25    Types: Cigarettes    Quit date: 03/13/1998    Years since quitting: 23.9   Smokeless tobacco: Never  Vaping Use   Vaping Use: Never used  Substance and Sexual Activity   Alcohol use: No   Drug use: No   Sexual activity: Not Currently    Birth control/protection: I.U.D.    Comment: Mirena   Other Topics Concern   Not on file  Social History Narrative          Social Determinants of Health   Financial Resource Strain: Low Risk  (11/13/2018)   Overall Financial Resource Strain (CARDIA)    Difficulty of Paying Living Expenses: Not hard at all  Food Insecurity: No Food Insecurity (11/13/2018)   Hunger Vital Sign    Worried About Running Out of Food in the Last Year: Never true    Ran Out of Food in the Last Year: Never true  Transportation Needs: No Transportation Needs (11/13/2018)   PRAPARE - Hydrologist (Medical): No    Lack of Transportation (Non-Medical): No  Physical Activity: Inactive (11/13/2018)   Exercise Vital Sign    Days of Exercise per Week: 0 days    Minutes of Exercise per Session: 0 min  Stress: Stress Concern Present (11/13/2018)   [place]    Feeling of Stress : Very much  Social Connections: Unknown (11/13/2018)   Social Connection and Isolation Panel [NHANES]    Frequency of Communication with Friends and Family: More than three times a week    Frequency of Social Gatherings with Friends and Family: More than three times a week    Attends Religious  Services: Patient refused    Active Member of Clubs or Organizations: Patient refused    Attends Archivist Meetings: Patient refused    Marital Status: Married  Human resources officer Violence: Not At Risk (11/13/2018)   Humiliation, Afraid, Rape, and Kick questionnaire    Fear of Current or Ex-Partner: No    Emotionally Abused: No    Physically Abused: No    Sexually Abused: No   Current Outpatient Medications on File Prior to Visit  Medication Sig Dispense Refill   insulin  lispro (HUMALOG KWIKPEN) 100 UNIT/ML KwikPen Inject 8-10 Units into the skin 3 (three) times daily before meals. Inject 15 min before a meal. 30 mL 3   Accu-Chek Softclix Lancets lancets Use to check blood sugar 3-4 times daily 100 each 12   albuterol (VENTOLIN HFA) 108 (90 Base) MCG/ACT inhaler Inhale 2 puffs into the lungs every 6 (six) hours as needed for wheezing or shortness of breath. (Patient not taking: Reported on 12/28/2021) 8 g 0   ALPRAZolam (XANAX) 0.25 MG tablet Take 1 tablet (0.25 mg total) by mouth 2 (two) times daily as needed for anxiety. (Patient not taking: Reported on 12/28/2021) 30 tablet 0   amLODipine (NORVASC) 10 MG tablet Take 1 tablet by mouth once daily 90 tablet 3   Continuous Blood Gluc Receiver (FREESTYLE LIBRE 2 READER) DEVI 1 each by Does not apply route daily. 1 each 3   Continuous Blood Gluc Sensor (FREESTYLE LIBRE 2 SENSOR) MISC 1 each by Does not apply route every 14 (fourteen) days. 6 each 3   dapagliflozin propanediol (FARXIGA) 10 MG TABS tablet Take 1 tablet (10 mg total) by mouth daily before breakfast. 30 tablet 11   Dulaglutide (TRULICITY) A999333 0000000 SOPN Inject 0.75 mg into the skin once a week. 2 mL 2   fluconazole (DIFLUCAN) 150 MG tablet Take 1 tablet (150 mg total) by mouth once a week. As needed 4 tablet 2   fluticasone (FLONASE) 50 MCG/ACT nasal spray Place 2 sprays into both nostrils daily. 16 g 0   glucose blood (ACCU-CHEK GUIDE) test strip Use to check blood  sugar 3-4 times daily 100 each 12   insulin glargine (LANTUS) 100 UNIT/ML injection Inject 0.3-0.4 mLs (30-40 Units total) into the skin daily. 10 mL 11   Insulin Pen Needle 32G X 4 MM MISC Use 4x a day 300 each 3   ketoconazole (NIZORAL) 2 % cream Apply 1 application topically in the morning and at bedtime. 60 g 5   ondansetron (ZOFRAN ODT) 4 MG disintegrating tablet Take 1 tablet (4 mg total) by mouth every 8 (eight) hours as needed for nausea or vomiting. 20 tablet 0   sertraline (ZOLOFT) 100 MG tablet Take 1.5 tablets (150 mg total) by mouth daily. 45 tablet 11   No current facility-administered medications on file prior to visit.   Allergies  Allergen Reactions   Biaxin [Clarithromycin] Hives   Metformin And Related Nausea And Vomiting    Also bad tingling   Toradol [Ketorolac Tromethamine]     Chest pain    Prednisone Anxiety and Palpitations    "Makes me feel crazy"   Morphine And Related     Chest Pain    Trulicity [Dulaglutide] Diarrhea    Bowel incontinence on high dose, tolerating 0.75 mg ok per patient   Tape Rash    Paper tape is fine   Family History  Problem Relation Age of Onset   Mitral valve prolapse Mother    Arrhythmia Mother    Hypertension Mother    Diabetes Mother    Hypertension Father    Hyperlipidemia Father    Heart disease Maternal Grandfather    Lung cancer Maternal Grandfather    Cancer Maternal Grandfather    Diabetes Brother    Heart disease Maternal Aunt    Diabetes Maternal Aunt    Depression Maternal Aunt    Cancer Maternal Grandmother        ovarian melanoma   Diabetes Maternal Grandmother    Diabetes Maternal  Uncle    PE: BP 120/88 (BP Location: Right Arm, Patient Position: Sitting, Cuff Size: Normal)   Pulse 88   Ht 5' 3"$  (1.6 m)   Wt 162 lb 9.6 oz (73.8 kg)   SpO2 99%   BMI 28.80 kg/m  Wt Readings from Last 3 Encounters:  02/28/22 162 lb 9.6 oz (73.8 kg)  12/28/21 157 lb 3.2 oz (71.3 kg)  10/19/21 157 lb (71.2 kg)    Constitutional: normal weight, in NAD Eyes: EOMI, no exophthalmos ENT: no thyromegaly, no cervical lymphadenopathy Cardiovascular: RRR, No MRG Respiratory: CTA B Musculoskeletal: no deformities Skin: no rashes Neurological: + Mild tremor with outstretched hands Diabetic Foot Exam - Simple   Simple Foot Form Diabetic Foot exam was performed with the following findings: Yes 02/28/2022 10:12 AM  Visual Inspection No deformities, no ulcerations, no other skin breakdown bilaterally: Yes Sensation Testing Intact to touch and monofilament testing bilaterally: Yes Pulse Check Posterior Tibialis and Dorsalis pulse intact bilaterally: Yes Comments     ASSESSMENT: 1. DM2, insulin-dependent, uncontrolled, with complications -Peripheral neuropathy  2. HL  3.  Yeast vaginitis  PLAN:  1. Patient with uncontrolled diabetes, on oral antidiabetic regimen with SGLT2 inhibitor, also weekly GLP-1 receptor agonist and basal-bolus insulin, with still poor control.  At last visit, HbA1c was slightly lower than before, at 9.5%, but still above target.  At that time, she had her grandmother CGM on and sugars were fluctuating around the upper limit of the target range, increasing after every meal, with most of the sugars around dinnertime being in the upper 200s up to 400s.  She was taking Lantus occasionally in the morning and sometimes at night.  We discussed about taking it at night.  I also recommended mealtime insulin and sent a prescription for Lyumjev to her pharmacy.  I advised her how to vary the dose before every meal.  This was not covered, so we had to switch to Humalog.  While starting mealtime insulin, I advised her to stop glipizide but continue [place] and low-dose Trulicity.  I strongly advised against drinking any sweet drinks. -Will need to check her for type 1 diabetes; we could not do this at last visit due to very high blood sugars CGM interpretation: -At today's visit, we reviewed her  CGM downloads: It appears that 20% of values are in target range (goal >70%), while 80% are higher than 180 (goal <25%), and 0% are lower than 70 (goal <4%).  The calculated average blood sugar is 269.  The projected HbA1c for the next 3 months (GMI) is 9.7%. -Reviewing the CGM trends, sugars are improving overnight, sometimes staying quite well-controlled especially in the second half of the night, but they increase significantly and quite abruptly after breakfast and remained elevated throughout the day mostly.  They decreased abruptly around 1 AM. -She tells me that she is taking the Humalog insulin varying the dose between 7 and, occasionally, 14 units.  We discussed that she will need to take higher doses if the sugars continue to increase after meals so today I advised her to increase the dose to 16-20 units.  I also advised her to make sure that she is taking the insulin approximately 15 minutes before every meal.  I feel that her dose of Lantus is adequate, but we may need to increase it if the sugars remain elevated after increasing the Humalog.  We also discussed about possibly increasing the dose of her GLP-1 receptor agonist, but she did not  tolerate the higher dose of Trulicity.  She would be interested in switching to [facility].  I sent the prescription for the lower dose to her pharmacy and I advised her to let me know if she is tolerating it well, in which case, we can increase the dose. - I suggested to:  Patient Instructions  Please continue: - Farxiga 10 mg before breakfast - Lantus 30 units at night  Increase: - Humalog 16-20 units 15 min before the 3 meals of the day  Change Trulicity to: - Mounjaro 2.5 mg weekly  Take 150 mg Diflucan x1 for now.  Please return in 3 months.  - we checked her HbA1c: 9.7% (higher) - advised to check sugars at different times of the day - 4x a day, rotating check times - advised for yearly eye exams >> she is not UTD - return to clinic in 3  months  2.HL -Reviewed latest lipid panel from 03/2020: LDL above target, HDL low: Lab Results  Component Value Date   CHOL 208 (H) 03/28/2020   HDL 34.90 (L) 03/28/2020   LDLCALC 148 (H) 03/28/2020   TRIG 129.0 03/28/2020   CHOLHDL 6 03/28/2020  - she is is not on a statin but she agrees to start 20 - she is due for another lipid panel - will check today (nonfasting)  3. Yeast vaginitis -Possibly related to [place] and the very high blood sugars -I sent a prescription for Diflucan to her pharmacy with 1 refill.  Component     Latest Ref Rng 02/28/2022  Hemoglobin A1C     4.0 - 5.6 % 9.7 !   Cholesterol     0 - 200 mg/dL 265 (H)   Triglycerides     0.0 - 149.0 mg/dL 117.0   HDL Cholesterol     >39.00 mg/dL 51.10   VLDL     0.0 - 40.0 mg/dL 23.4   LDL (calc)     0 - 99 mg/dL 190 (H)   Total CHOL/HDL Ratio 5   NonHDL 213.43   And there is very high. Will suggest a statin -Lipitor 40 milligrams daily.  Philemon Kingdom, MD PhD [facility] Endocrinology

## 2022-02-28 NOTE — Patient Instructions (Addendum)
Please continue: - Farxiga 10 mg before breakfast - Lantus 30 units at night  Increase: - Humalog 16-20 units 15 min before the 3 meals of the day  Change Trulicity to: - Mounjaro 2.5 mg weekly  Take 150 mg Diflucan x1 for now.  Please return in 3 months.

## 2022-02-28 NOTE — Addendum Note (Signed)
Addended by: Philemon Kingdom on: 02/28/2022 05:03 PM   Modules accepted: Orders

## 2022-03-28 ENCOUNTER — Other Ambulatory Visit (HOSPITAL_COMMUNITY): Payer: Self-pay

## 2022-04-02 ENCOUNTER — Telehealth: Payer: Self-pay

## 2022-04-02 ENCOUNTER — Other Ambulatory Visit: Payer: Self-pay | Admitting: Internal Medicine

## 2022-04-02 NOTE — Telephone Encounter (Signed)
PA needed for [facility] and [place]

## 2022-04-04 ENCOUNTER — Telehealth: Payer: Self-pay

## 2022-04-04 ENCOUNTER — Other Ambulatory Visit (HOSPITAL_COMMUNITY): Payer: Self-pay

## 2022-04-04 DIAGNOSIS — E1142 Type 2 diabetes mellitus with diabetic polyneuropathy: Secondary | ICD-10-CM

## 2022-04-04 NOTE — Telephone Encounter (Signed)
Pharmacy Patient Advocate Encounter  Received notification from [facility] that the request for prior authorization for [facility] has been denied due to .     PLEASE ADVISE  Karie Soda, CPhT Pharmacy Patient Advocate Specialist Direct Number: [phone] Fax: [phone]

## 2022-04-04 NOTE — Telephone Encounter (Signed)
Pharmacy Patient Advocate Encounter  Received notification from [facility] that the request for prior authorization for [facility] 2.5MG /.5ML  has been denied due to   [place], [place] Patient Advocate Specialist Direct Number: [phone] Fax: [phone]

## 2022-04-04 NOTE — Telephone Encounter (Signed)
Pharmacy Patient Advocate Encounter   Received notification from [facility] that prior authorization for [facility] 2.5MG /.5ML is needed.    PA submitted on 04/04/22 VIA [employer]  Status is pending  Karie Soda, CPhT Pharmacy Patient Advocate Specialist Direct Number: [phone] Fax: [phone]

## 2022-04-04 NOTE — Telephone Encounter (Signed)
Pharmacy Patient Advocate Encounter   Received notification from [facility] that prior authorization for FARXIGA 10 is needed.    PA submitted on 04/04/22  Status is pending  Karie Soda, [place] Patient Advocate Specialist Direct Number: [phone] Fax: [phone]

## 2022-04-05 MED ORDER — SEMAGLUTIDE(0.25 OR 0.5MG/DOS) 2 MG/3ML ~~LOC~~ SOPN: 0.5000 mg | PEN_INJECTOR | SUBCUTANEOUS | 1 refills | Status: DC

## 2022-04-05 NOTE — Telephone Encounter (Signed)
Let's appeal.  She has uncontrolled diabetes, with an improved HbA1c at last check, while on Farxiga.  We already have her on a basal/bolus insulin regimen and a GLP-1 receptor agonist.  We cannot de-escalate her regimen for now.

## 2022-04-05 NOTE — Telephone Encounter (Signed)
Rx for Ozempic sent.

## 2022-04-08 NOTE — Telephone Encounter (Signed)
We have started going through documentation to initiate appeal.   What A1C labs did you want [place] to use? The most recent labs on Epic I can find show an increase in A1C since starting Farxiga (referring to 02/28/22).   Please advise.

## 2022-04-09 NOTE — Telephone Encounter (Signed)
Anita Massey, We can compare to the 9.9% from 10/2021. Thank you! CG

## 2022-04-09 NOTE — Telephone Encounter (Signed)
Thank you for confirming. Appeal request has been faxed to [place].

## 2022-04-11 ENCOUNTER — Other Ambulatory Visit: Payer: Self-pay | Admitting: Internal Medicine

## 2022-04-11 DIAGNOSIS — B3731 Acute candidiasis of vulva and vagina: Secondary | ICD-10-CM

## 2022-04-11 MED ORDER — NYSTATIN 100000 UNIT/GM EX POWD: 1.0000 | Freq: Three times a day (TID) | CUTANEOUS | 0 refills | Status: DC

## 2022-04-11 MED ORDER — FLUCONAZOLE 150 MG PO TABS: 150.0000 mg | ORAL_TABLET | ORAL | 1 refills | Status: DC

## 2022-04-11 NOTE — Telephone Encounter (Signed)
Patient will come by Monday and pick up sample and patient was advised why [place] was not covered.

## 2022-04-11 NOTE — Telephone Encounter (Signed)
Patient would like PA to be processed for the Ozempic

## 2022-04-11 NOTE — Telephone Encounter (Signed)
Pharmacy Patient Advocate Encounter  Received notification from [facility] that the [place] request for prior authorization for FARXIGA 10 MG has been denied due to [place] (NEEDING TO SEE IMPROVEMENT WHILE ON MEDICATION) (10/23 LABS WERE SUBMITTED TO COMPARE)     PLEASE ADVISE  Karie Soda, [place] Patient Advocate Specialist Direct Number: [phone] Fax: [phone]

## 2022-04-15 ENCOUNTER — Other Ambulatory Visit (HOSPITAL_COMMUNITY): Payer: Self-pay

## 2022-04-15 ENCOUNTER — Telehealth: Payer: Self-pay | Admitting: Pharmacy Technician

## 2022-04-15 NOTE — Telephone Encounter (Signed)
Pharmacy Patient Advocate Encounter   Received notification from pt calls msgs/MD that prior authorization for Ozempic is required/requested.   PA submitted on 04/15/22 to (ins) [place] via [facility]- via fax [place] or [facility]) confirmation # [id number] Status is pending

## 2022-04-19 NOTE — Telephone Encounter (Signed)
Pt advised.

## 2022-04-19 NOTE — Telephone Encounter (Signed)
Pharmacy Patient Advocate Encounter  Prior Authorization for Ozempic has been approved by [facility]  (ins).    PA # [id number]  Effective dates: 04/15/22 through 04/15/23

## 2022-05-29 ENCOUNTER — Ambulatory Visit (INDEPENDENT_AMBULATORY_CARE_PROVIDER_SITE_OTHER): Payer: Medicaid Other | Admitting: Internal Medicine

## 2022-05-29 ENCOUNTER — Encounter: Payer: Self-pay | Admitting: Internal Medicine

## 2022-05-29 VITALS — BP 122/80 | HR 77 | Ht 63.0 in | Wt 167.2 lb

## 2022-05-29 DIAGNOSIS — E785 Hyperlipidemia, unspecified: Secondary | ICD-10-CM

## 2022-05-29 DIAGNOSIS — E1165 Type 2 diabetes mellitus with hyperglycemia: Secondary | ICD-10-CM

## 2022-05-29 DIAGNOSIS — E1142 Type 2 diabetes mellitus with diabetic polyneuropathy: Secondary | ICD-10-CM | POA: Diagnosis not present

## 2022-05-29 DIAGNOSIS — Z794 Long term (current) use of insulin: Secondary | ICD-10-CM | POA: Diagnosis not present

## 2022-05-29 DIAGNOSIS — Z7984 Long term (current) use of oral hypoglycemic drugs: Secondary | ICD-10-CM

## 2022-05-29 DIAGNOSIS — Z7985 Long-term (current) use of injectable non-insulin antidiabetic drugs: Secondary | ICD-10-CM | POA: Diagnosis not present

## 2022-05-29 DIAGNOSIS — E119 Type 2 diabetes mellitus without complications: Secondary | ICD-10-CM

## 2022-05-29 LAB — POCT GLYCOSYLATED HEMOGLOBIN (HGB A1C): Hemoglobin A1C: 11.1 % — AB (ref 4.0–5.6)

## 2022-05-29 MED ORDER — TERCONAZOLE 0.4 % VA CREA: 1.0000 | TOPICAL_CREAM | Freq: Every day | VAGINAL | 0 refills | Status: DC

## 2022-05-29 NOTE — Patient Instructions (Addendum)
Please increase: - Lantus 30 units 2x a day and can continue to increase if sugars remain high (may need to increase further, up to 40 units 2x a day)  Continue: - Humalog 26-28 units 15 min before the 2 meals of the day (may need to increase to 30-35 units) - Ozempic 0.5 mg weekly  Look up: - Omnipod 5 - Tandem t:slim X2 Which work with Dexcom sensor.  Please start Terconazole Cream 0.4%: - insert 1 applicatorful vaginally at bedtime every night for 1 week, then - insert 1/2 applicatorful at bedtime once weekly indefinitely to maintain effect  Please return in 3 months.

## 2022-05-29 NOTE — Progress Notes (Signed)
Patient ID: Anita Massey, female   DOB: 1980-06-16, 42 y.o.   MRN: [id number]  HPI: Anita Massey is a 42 y.o.-year-old female, self-referred, for management of DM2, dx in 2020, insulin-dependent since 2022, uncontrolled, with long term complications (PN).  Her mother Anita Massey) is also my patient.  Last visit 3 months ago. She previously saw Dr. Roanna Raider.  Interim history: She has increased urination, blurry vision, no nausea, chest pain. She had to stop [place] since last visit due to lack of coverage. She is not sure if she can continue her Medicaid beyond next month. She still has a yeast infection.  Reviewed HbA1c: Lab Results  Component Value Date   HGBA1C 9.7 (A) 02/28/2022   HGBA1C 9.5 (A) 12/28/2021   HGBA1C 9.9 (A) 10/19/2021   HGBA1C 8.9 (A) 05/11/2021   HGBA1C 9.7 (A) 02/16/2021   HGBA1C 9.3 (A) 08/28/2020   HGBA1C 9.9 (H) 03/28/2020   HGBA1C 5.9 12/26/2017   HGBA1C 5.5 02/10/2013  11/23/2021 (records from [place] accessed by pt. During the appt.): HbA1c calculated from fructosamine: 8.1%  Pt is on a regimen of: - Glipizide XL 10 mg twice a day >> Lyumjev >> Humalog 7 to 14  >> 26 units before every meal (2x a day) - Farxiga 10 mg before breakfast >> stopped b/c not covered - Trulicity 0.75 mg weekly >> Ozempic 0.5 mg weekly - Lantus 15 >>... 40 units  in am (for the last week) or at bedtime >> 30 units at bedtime She was not able to tolerate metformin due to nausea but also tingling. She was not able to tolerate higher doses of Trulicity due to stool incontinence. She would not want to be on an insulin pump, like her mom, if possible.  She is sugars more than 4 times a day with her freestyle libre CGM -approved by her insurance:  Previously:  Prev.:  Lowest sugar was 77 >> 60s >> 50s; hypoglycemia awareness at 100. Highest sugar was 600s >> HI >> HI.  Glucometer: Accu-Chek guide  Pt's meals are: - Breakfast: oatmeal, cereal, eggs - Lunch: sandwich,  pizza - Dinner: chicken or other meat, veggies, salad, baked potatoes - Snacks: 2 Stopped sweet tea and sodas  -only has these rarely now.  - no CKD, last BUN/creatinine:  11/23/2021: 17/0.62, GFR 114, Glu 171 Lab Results  Component Value Date   BUN 15 08/28/2020   BUN 11 03/28/2020   CREATININE 0.68 08/28/2020   CREATININE 0.64 03/28/2020  She is not on ACE inhibitor/ARB.  On Farxiga.  -+ HL; last set of lipids: Lab Results  Component Value Date   CHOL 265 (H) 02/28/2022   HDL 51.10 02/28/2022   LDLCALC 190 (H) 02/28/2022   TRIG 117.0 02/28/2022   CHOLHDL 5 02/28/2022  She was not on a statin but I advised her to start Lipitor 40 mg daily at last visit.  - last eye exam was in >1 year ago  - before DM diagnosis.   - + numbness and tingling in her feet.  Last foot exam was at last visit, in 02/2022.  Pt has FH of DM in mother (who is also my patient), and maternal side of the family.  She has a history of yeast infections for which she intermittently takes  Diflucan. She also has history of HTN, microscopic hematuria, elevated liver enzymes, GERD, obesity (in the past, she was weighing over 200 pounds), anemia, rosacea, homozygosity of MTHFR variant, anxiety.  ROS: + See HPI  Past Medical History:  Diagnosis Date   Anemia    Anxiety state, unspecified    Blood dyscrasia    PROTHROMBIN G 16109 HETEROZYGOSITY (FACTOR 2)   Clotting disorder (HCC)    Diabetes mellitus without complication (HCC)    GERD (gastroesophageal reflux disease)    OCC   Homozygous for MTHFR gene mutation    Hypertension    Hypothyroidism    Multiple joint pain 09/04/2015   Obesity    Rosacea    Undiagnosed cardiac murmurs    Unspecified essential hypertension    Past Surgical History:  Procedure Laterality Date   APPENDECTOMY  2009   [facility]   CESAREAN SECTION  2016   CESAREAN SECTION N/A 11/15/2018   Procedure: REPEAT CESAREAN SECTION;  Surgeon: Natale Milch, MD;  Location: [facility]  ORS;  Service: Obstetrics;  Laterality: N/A;  Female born @ [birth year] Apgars: 8/9 Weight: 6lbs 10 ozs   CHOLECYSTECTOMY N/A 03/20/2016   Procedure: LAPAROSCOPIC CHOLECYSTECTOMY;  Surgeon: Leafy Ro, MD;  Location: [facility] ORS;  Service: General;  Laterality: N/A;   DILATION AND CURETTAGE OF UTERUS     Social History   Socioeconomic History   Marital status: Married    Spouse name: Cristal Deer   Number of children: 3   Years of education: Not on file   Highest education level: Not on file  Occupational History   Occupation: Homemaker  Tobacco Use   Smoking status: Former    Packs/day: 0.25    Years: 1.00    Additional pack years: 0.00    Total pack years: 0.25    Types: Cigarettes    Quit date: 03/13/1998    Years since quitting: 24.2   Smokeless tobacco: Never  Vaping Use   Vaping Use: Never used  Substance and Sexual Activity   Alcohol use: No   Drug use: No   Sexual activity: Not Currently    Birth control/protection: I.U.D.    Comment: Mirena   Other Topics Concern   Not on file  Social History Narrative          Social Determinants of Health   Financial Resource Strain: Low Risk  (11/13/2018)   Overall Financial Resource Strain (CARDIA)    Difficulty of Paying Living Expenses: Not hard at all  Food Insecurity: No Food Insecurity (11/13/2018)   Hunger Vital Sign    Worried About Running Out of Food in the Last Year: Never true    Ran Out of Food in the Last Year: Never true  Transportation Needs: No Transportation Needs (11/13/2018)   PRAPARE - Administrator, Civil Service (Medical): No    Lack of Transportation (Non-Medical): No  Physical Activity: Inactive (11/13/2018)   Exercise Vital Sign    Days of Exercise per Week: 0 days    Minutes of Exercise per Session: 0 min  Stress: Stress Concern Present (11/13/2018)   [employer] of Occupational Health - Occupational Stress Questionnaire    Feeling of Stress : Very much  Social Connections:  Unknown (11/13/2018)   Social Connection and Isolation Panel [NHANES]    Frequency of Communication with Friends and Family: More than three times a week    Frequency of Social Gatherings with Friends and Family: More than three times a week    Attends Religious Services: Patient declined    Database administrator or Organizations: Patient declined    Attends Banker Meetings: Patient declined    Marital Status: Married  Intimate  Partner Violence: Not At Risk (11/13/2018)   Humiliation, Afraid, Rape, and Kick questionnaire    Fear of Current or Ex-Partner: No    Emotionally Abused: No    Physically Abused: No    Sexually Abused: No   Current Outpatient Medications on File Prior to Visit  Medication Sig Dispense Refill   insulin lispro (HUMALOG KWIKPEN) 100 UNIT/ML KwikPen Inject 8-10 Units into the skin 3 (three) times daily before meals. Inject 15 min before a meal. 30 mL 3   Accu-Chek Softclix Lancets lancets Use to check blood sugar 3-4 times daily 100 each 12   albuterol (VENTOLIN HFA) 108 (90 Base) MCG/ACT inhaler Inhale 2 puffs into the lungs every 6 (six) hours as needed for wheezing or shortness of breath. (Patient not taking: Reported on 12/28/2021) 8 g 0   ALPRAZolam (XANAX) 0.25 MG tablet Take 1 tablet (0.25 mg total) by mouth 2 (two) times daily as needed for anxiety. (Patient not taking: Reported on 12/28/2021) 30 tablet 0   amLODipine (NORVASC) 10 MG tablet Take 1 tablet by mouth once daily 90 tablet 0   atorvastatin (LIPITOR) 40 MG tablet Take 1 tablet (40 mg total) by mouth daily. 90 tablet 3   Continuous Blood Gluc Receiver (FREESTYLE LIBRE 2 READER) DEVI 1 each by Does not apply route daily. 1 each 3   Continuous Blood Gluc Sensor (FREESTYLE LIBRE 2 SENSOR) MISC 1 each by Does not apply route every 14 (fourteen) days. 6 each 3   dapagliflozin propanediol (FARXIGA) 10 MG TABS tablet Take 1 tablet (10 mg total) by mouth daily before breakfast. 30 tablet 11    fluconazole (DIFLUCAN) 150 MG tablet Take 1 tablet (150 mg total) by mouth once a week. As needed 1 tablet 1   fluticasone (FLONASE) 50 MCG/ACT nasal spray Place 2 sprays into both nostrils daily. 16 g 0   glucose blood (ACCU-CHEK GUIDE) test strip Use to check blood sugar 3-4 times daily 100 each 12   insulin glargine (LANTUS) 100 UNIT/ML injection Inject 0.3-0.4 mLs (30-40 Units total) into the skin daily. 10 mL 11   Insulin Pen Needle 32G X 4 MM MISC Use 4x a day 300 each 3   ketoconazole (NIZORAL) 2 % cream Apply 1 application topically in the morning and at bedtime. 60 g 5   nystatin (MYCOSTATIN/NYSTOP) powder Apply 1 Application topically 3 (three) times daily. 15 g 0   ondansetron (ZOFRAN ODT) 4 MG disintegrating tablet Take 1 tablet (4 mg total) by mouth every 8 (eight) hours as needed for nausea or vomiting. 20 tablet 0   Semaglutide,0.25 or 0.5MG /DOS, 2 MG/3ML SOPN Inject 0.5 mg into the skin once a week. 9 mL 1   sertraline (ZOLOFT) 100 MG tablet Take 1.5 tablets (150 mg total) by mouth daily. 45 tablet 11   No current facility-administered medications on file prior to visit.   Allergies  Allergen Reactions   Biaxin [Clarithromycin] Hives   Metformin And Related Nausea And Vomiting    Also bad tingling   Toradol [Ketorolac Tromethamine]     Chest pain    Prednisone Anxiety and Palpitations    "Makes me feel crazy"   Morphine And Codeine     Chest Pain    Trulicity [Dulaglutide] Diarrhea    Bowel incontinence on high dose, tolerating 0.75 mg ok per patient   Tape Rash    Paper tape is fine   Family History  Problem Relation Age of Onset   Mitral valve prolapse  Mother    Arrhythmia Mother    Hypertension Mother    Diabetes Mother    Hypertension Father    Hyperlipidemia Father    Heart disease Maternal Grandfather    Lung cancer Maternal Grandfather    Cancer Maternal Grandfather    Diabetes Brother    Heart disease Maternal Aunt    Diabetes Maternal Aunt     Depression Maternal Aunt    Cancer Maternal Grandmother        ovarian melanoma   Diabetes Maternal Grandmother    Diabetes Maternal Uncle    PE: BP 122/80 (BP Location: Right Arm, Patient Position: Sitting, Cuff Size: Normal)   Pulse 77   Ht 5\' 3"  (1.6 m)   Wt 167 lb 3.2 oz (75.8 kg)   SpO2 99%   BMI 29.62 kg/m  Wt Readings from Last 3 Encounters:  05/29/22 167 lb 3.2 oz (75.8 kg)  02/28/22 162 lb 9.6 oz (73.8 kg)  12/28/21 157 lb 3.2 oz (71.3 kg)   Constitutional: normal weight, in NAD Eyes: EOMI, no exophthalmos ENT: no thyromegaly, no cervical lymphadenopathy Cardiovascular: RRR, No MRG Respiratory: CTA B Musculoskeletal: no deformities Skin: no rashes Neurological: + Mild tremor with outstretched hands  ASSESSMENT: 1. DM2, insulin-dependent, uncontrolled, with complications -Peripheral neuropathy  2. HL  PLAN:  1. Patient with uncontrolled diabetes, on weekly GLP-1 receptor agonist and basal/bolus insulin with very poor control. -We have not be able to check her for type 1 diabetes due to the very high blood sugars, but she is treated as such. -At last visit, we increased her Humalog dose and I recommended to start [facility], but she was not able to obtain this.  She was able to start Ozempic.  She has some loose stools and mild abdominal discomfort with it. CGM interpretation: -At today's visit, we reviewed her CGM downloads: It appears that 3% of values are in target range (goal >70%), while 97% are higher than 180 (goal <25%), and 0% are lower than 70 (goal <4%).  The calculated average blood sugar is 343.  The projected HbA1c for the next 3 months (GMI) is 11.5%. -Reviewing the CGM trends, sugars appear to be very high at all times of the day, but especially after meals.  She did increase her Humalog doses slightly since last visit, but not the Lantus.  Moreover, she to come off [place] due to lack of coverage which could be the reason why the sugars are even higher  than before. -At this visit, we discussed about increasing the Lantus dose by 100% and even higher if needed and she can also increase the Humalog further if needed.  However, I also suggested an insulin pump and discussed about the available pumps in which sensors they connect with.  I advised her to check with her insurance about this.  She would be interested in the OmniPod pump. -She also has another yeast infection and I did not recommend further fluconazole treatments but I recommended terconazole" this sent to her pharmacy. - I suggested to:  Patient Instructions  Please increase: - Lantus 30 units 2x a day and can continue to increase if sugars remain high (may need to increase further, up to 40 units 2x a day)  Continue: - Humalog 26-28 units 15 min before the 2 meals of the day (may need to increase to 30-35 units) - Ozempic 0.5 mg weekly  Look up: - Omnipod 5 - Tandem t:slim X2 Which work with Dexcom sensor.  Please  start Terconazole Cream 0.4%: - insert 1 applicatorful vaginally at bedtime every night for 1 week, then - insert 1/2 applicatorful at bedtime once weekly indefinitely to maintain effect  Please return in 3 months.  - we checked her HbA1c: 9.7% (higher) - advised to check sugars at different times of the day - 4x a day, rotating check times - advised for yearly eye exams >> she is not UTD - return to clinic in 3 months  2.HL -Reviewed latest lipid panel from 02/2022: LDL very high, the rest the fractions at goal: Lab Results  Component Value Date   CHOL 265 (H) 02/28/2022   HDL 51.10 02/28/2022   LDLCALC 190 (H) 02/28/2022   TRIG 117.0 02/28/2022   CHOLHDL 5 02/28/2022  -After the above with labs returned, I suggested Lipitor 40 mg daily.  She continues on this.  Will check her lipid panel at next visit, when hopefully her diabetes would be better controlled.  Carlus Pavlov, MD PhD [facility]

## 2022-06-17 DIAGNOSIS — E119 Type 2 diabetes mellitus without complications: Secondary | ICD-10-CM | POA: Diagnosis not present

## 2022-06-25 ENCOUNTER — Telehealth: Payer: Self-pay | Admitting: Internal Medicine

## 2022-06-25 NOTE — Telephone Encounter (Signed)
I spoke with pt; earlier this morning pt had an anxiety attack that lasted  for  a short while; no anxiety or panic attack at this time.pt said about 25 - 30 mins ago  pt had mid dull chest pain on and off for 5 - 10 mins. The CP did not radiate anywhere.No CP at this time. Pt cannot take BP now. Pt said has eaten 3 pieces of pizza and now BS will only read high. Advised pt  she needs to utilize a diabetic diet pt answered the pieces of .pizza were not very big. Pt said she does not have a way to go to ED and pts husband is at work and cannot leave until tonight around 6:30pm. I asked pt if she wanted me to call her husband to let him know pts symptoms and BS is reading HI. Pt said not to call her husband and her mom has health issues and her brothers and sister are working.pt does not have anyone to watch her 43,8 and 66 yr old children also. Pt does not want 911 called and pt said she does not want to go to ED or UC,pt wants to know if she can increase her zoloft to 200 mg. I advised pt if she has had anxiety attack today, CP earlier and now BS is reading High pt should be evaluated rather than just increase med.   I offered pt an appt with another provider since Dr Alphonsus Sias is out of office this afternoon and he does not have available appt in AM. Pt said she only wants to see Dr Alphonsus Sias. Pt said she might go to ED when her husband gets home but wants next available appt with Dr Alphonsus Sias. Pt scheduled appt with Dr Alphonsus Sias on 06/27/22 at 10:30 am. UC & ED precautions given again and pt voiced understanding.    [facility] [address]. Sending note to Dr Alphonsus Sias who is out of office and Dr Milinda Antis who is in office.

## 2022-06-25 NOTE — Telephone Encounter (Signed)
Access nurse called back over and stated that they were concerned about the patient. She stated that she was having chest pains along with dealing with anxiety. She stated that she advise the patient to go to the ED but patient stated that she had nobody to watch her kids or to take her. She stated that she is taking 200 MG of Zoloft instead of the recommended dose of 150 MG.

## 2022-06-25 NOTE — Telephone Encounter (Signed)
FYI: This call has been transferred to Access Nurse. Once the result note has been entered staff can address the message at that time.  Patient called in with the following symptoms:  Red Word: terrible anxiety, panic attacks ongoing for 2 days    Please advise at Mobile [phone] (mobile)  Message is routed to Provider Pool and [facility] Triage

## 2022-06-26 NOTE — Telephone Encounter (Signed)
Called patient no answer no voice mail.  

## 2022-06-26 NOTE — Telephone Encounter (Signed)
Tried to call pt. No answer and no vm to leave a message 

## 2022-06-27 ENCOUNTER — Ambulatory Visit (INDEPENDENT_AMBULATORY_CARE_PROVIDER_SITE_OTHER): Payer: Medicaid Other | Admitting: Internal Medicine

## 2022-06-27 ENCOUNTER — Encounter: Payer: Self-pay | Admitting: Internal Medicine

## 2022-06-27 VITALS — BP 104/74 | HR 90 | Temp 97.8°F | Ht 63.0 in | Wt 163.0 lb

## 2022-06-27 DIAGNOSIS — F331 Major depressive disorder, recurrent, moderate: Secondary | ICD-10-CM

## 2022-06-27 MED ORDER — ALPRAZOLAM 0.25 MG PO TABS: 0.2500 mg | ORAL_TABLET | Freq: Two times a day (BID) | ORAL | 0 refills | Status: AC | PRN

## 2022-06-27 MED ORDER — AMLODIPINE BESYLATE 10 MG PO TABS: 10.0000 mg | ORAL_TABLET | Freq: Every day | ORAL | 3 refills | Status: DC

## 2022-06-27 MED ORDER — SERTRALINE HCL 100 MG PO TABS: 200.0000 mg | ORAL_TABLET | Freq: Every day | ORAL | 3 refills | Status: DC

## 2022-06-27 NOTE — Telephone Encounter (Signed)
Pt is in the office with Dr Alphonsus Sias.

## 2022-06-27 NOTE — Assessment & Plan Note (Signed)
This has been ongoing but mostly has more stress and then anxiety attack Will refill the xanax for prn use Continue the increased dose of sertraline-- 200mg  daily If ongoing symptoms--will add duloxetine 30mg  daily

## 2022-06-27 NOTE — Progress Notes (Signed)
Subjective:    Patient ID: Anita Massey, female    DOB: 01-Sep-1980, 42 y.o.   MRN: [id number]  HPI Here due to apparent panic attack  Symptoms started bad 3 days ago Waking her up at night with racing heart Panicky all day--- 3 days ago Increased the sertraline to 200mg  from 150 that evening Has kept up with that dose "Minor" chest pain and racing heart  Is on drops for increased intraocular pressure Latanoprost  A1c is "horrible" Mom struggling---this has been hard on her All 3 kids home now Overall stressed Some depression---every day No thoughts of suicide or dying. Not anhedonic Has been sleeping more--but still able to do chores, etc  Current Outpatient Medications on File Prior to Visit  Medication Sig Dispense Refill   Accu-Chek Softclix Lancets lancets Use to check blood sugar 3-4 times daily 100 each 12   ALPRAZolam (XANAX) 0.25 MG tablet Take 1 tablet (0.25 mg total) by mouth 2 (two) times daily as needed for anxiety. 30 tablet 0   amLODipine (NORVASC) 10 MG tablet Take 1 tablet by mouth once daily 90 tablet 0   atorvastatin (LIPITOR) 40 MG tablet Take 1 tablet (40 mg total) by mouth daily. 90 tablet 3   Continuous Blood Gluc Receiver (FREESTYLE LIBRE 2 READER) DEVI 1 each by Does not apply route daily. 1 each 3   Continuous Blood Gluc Sensor (FREESTYLE LIBRE 2 SENSOR) MISC 1 each by Does not apply route every 14 (fourteen) days. 6 each 3   fluconazole (DIFLUCAN) 150 MG tablet Take 1 tablet (150 mg total) by mouth once a week. As needed 1 tablet 1   fluticasone (FLONASE) 50 MCG/ACT nasal spray Place 2 sprays into both nostrils daily. 16 g 0   glucose blood (ACCU-CHEK GUIDE) test strip Use to check blood sugar 3-4 times daily 100 each 12   insulin glargine (LANTUS) 100 UNIT/ML injection Inject 0.3-0.4 mLs (30-40 Units total) into the skin daily. 10 mL 11   insulin lispro (HUMALOG KWIKPEN) 100 UNIT/ML KwikPen Inject 8-10 Units into the skin 3 (three) times daily  before meals. Inject 15 min before a meal. 30 mL 3   Insulin Pen Needle 32G X 4 MM MISC Use 4x a day 300 each 3   ketoconazole (NIZORAL) 2 % cream Apply 1 application topically in the morning and at bedtime. 60 g 5   nystatin (MYCOSTATIN/NYSTOP) powder Apply 1 Application topically 3 (three) times daily. 15 g 0   ondansetron (ZOFRAN ODT) 4 MG disintegrating tablet Take 1 tablet (4 mg total) by mouth every 8 (eight) hours as needed for nausea or vomiting. 20 tablet 0   Semaglutide,0.25 or 0.5MG /DOS, 2 MG/3ML SOPN Inject 0.5 mg into the skin once a week. 9 mL 1   sertraline (ZOLOFT) 100 MG tablet Take 1.5 tablets (150 mg total) by mouth daily. 45 tablet 11   terconazole (TERAZOL 7) 0.4 % vaginal cream Place 1 applicator vaginally at bedtime. 45 g 0   No current facility-administered medications on file prior to visit.    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 2)   Clotting disorder (HCC)    Diabetes mellitus without complication (HCC)    GERD (gastroesophageal reflux disease)    OCC   Homozygous for MTHFR gene mutation    Hypertension    Hypothyroidism    Multiple joint pain 09/04/2015   Obesity    Rosacea    Undiagnosed cardiac murmurs    Unspecified essential hypertension     Past Surgical History:  Procedure Laterality Date   APPENDECTOMY  2009   [facility]   CESAREAN SECTION  2016   CESAREAN SECTION N/A 11/15/2018   Procedure: REPEAT CESAREAN SECTION;   Surgeon: Natale Milch, MD;  Location: [facility] ORS;  Service: Obstetrics;  Laterality: N/A;  Female born @ [birth year] Apgars: 8/9 Weight: 6lbs 10 ozs   CHOLECYSTECTOMY N/A 03/20/2016   Procedure: LAPAROSCOPIC CHOLECYSTECTOMY;  Surgeon: Leafy Ro, MD;  Location: [facility] ORS;  Service: General;  Laterality: N/A;   DILATION AND CURETTAGE OF UTERUS      Family History  Problem Relation Age of Onset   Mitral valve prolapse Mother    Arrhythmia Mother    Hypertension Mother    Diabetes Mother    Hypertension Father    Hyperlipidemia Father    Heart disease Maternal Grandfather    Lung cancer Maternal Grandfather    Cancer Maternal Grandfather    Diabetes Brother    Heart disease Maternal Aunt    Diabetes Maternal Aunt    Depression Maternal Aunt    Cancer Maternal Grandmother        ovarian melanoma   Diabetes Maternal Grandmother    Diabetes Maternal Uncle     Social History   Socioeconomic History   Marital status: Married    Spouse name: Cristal Deer   Number of children: 3   Years of education: Not on file   Highest education level: Not on file  Occupational History   Occupation: Homemaker  Tobacco Use   Smoking status: Former    Packs/day: 0.25    Years: 1.00    Additional pack years: 0.00    Total pack years: 0.25    Types: Cigarettes    Quit date: 03/13/1998    Years since quitting: 24.3   Smokeless tobacco: Never  Vaping Use   Vaping Use: Never used  Substance and Sexual Activity   Alcohol use: No   Drug use: No   Sexual activity: Not Currently    Birth control/protection: I.U.D.    Comment: Mirena   Other Topics Concern   Not on file  Social History Narrative          Social Determinants of Health   Financial Resource Strain: Low Risk  (11/13/2018)   Overall Financial Resource Strain (CARDIA)    Difficulty of Paying Living Expenses: Not hard at all  Food Insecurity: No Food Insecurity (11/13/2018)   Hunger Vital Sign    Worried About Running Out of  Food in the Last Year: Never true    Ran Out of Food in the Last Year: Never true  Transportation Needs: No Transportation Needs (11/13/2018)   PRAPARE - Administrator, Civil Service (Medical): No    Lack of Transportation (Non-Medical): No  Physical Activity: Inactive (11/13/2018)  Exercise Vital Sign    Days of Exercise per Week: 0 days    Minutes of Exercise per Session: 0 min  Stress: Stress Concern Present (11/13/2018)   [employer] of Occupational Health - Occupational Stress Questionnaire    Feeling of Stress : Very much  Social Connections: Unknown (11/13/2018)   Social Connection and Isolation Panel [NHANES]    Frequency of Communication with Friends and Family: More than three times a week    Frequency of Social Gatherings with Friends and Family: More than three times a week    Attends Religious Services: Patient declined    Database administrator or Organizations: Patient declined    Attends Banker Meetings: Patient declined    Marital Status: Married  Catering manager Violence: Not At Risk (11/13/2018)   Humiliation, Afraid, Rape, and Kick questionnaire    Fear of Current or Ex-Partner: No    Emotionally Abused: No    Physically Abused: No    Sexually Abused: No   Review of Systems Working with Dr Elvera Lennox on her control Hopes to start insulin pump--hoping insurance will help     Objective:   Physical Exam Constitutional:      Appearance: Normal appearance.  Cardiovascular:     Rate and Rhythm: Normal rate and regular rhythm.     Heart sounds: No murmur heard.    No gallop.  Pulmonary:     Effort: Pulmonary effort is normal.     Breath sounds: Normal breath sounds. No wheezing or rales.  Musculoskeletal:     Cervical back: Neck supple.     Right lower leg: No edema.     Left lower leg: No edema.  Lymphadenopathy:     Cervical: No cervical adenopathy.  Neurological:     Mental Status: She is alert.  Psychiatric:         Mood and Affect: Mood normal.        Behavior: Behavior normal.            Assessment & Plan:

## 2022-06-28 ENCOUNTER — Other Ambulatory Visit: Payer: Self-pay | Admitting: Internal Medicine

## 2022-06-28 NOTE — Telephone Encounter (Signed)
She is seeing endo now. I forwarded the request from the pharmacy to them. I can send in a 30 day supply until they respond.

## 2022-06-28 NOTE — Telephone Encounter (Signed)
Patient contacted the office regarding this request, states she is completely out of this medication. Pharmacy told her that all refills for this are expired.

## 2022-07-21 ENCOUNTER — Other Ambulatory Visit: Payer: Self-pay | Admitting: Internal Medicine

## 2022-08-07 DIAGNOSIS — H40053 Ocular hypertension, bilateral: Secondary | ICD-10-CM | POA: Diagnosis not present

## 2022-08-29 ENCOUNTER — Encounter (INDEPENDENT_AMBULATORY_CARE_PROVIDER_SITE_OTHER): Payer: Self-pay

## 2022-09-04 DIAGNOSIS — H40053 Ocular hypertension, bilateral: Secondary | ICD-10-CM | POA: Diagnosis not present

## 2022-09-04 DIAGNOSIS — H04123 Dry eye syndrome of bilateral lacrimal glands: Secondary | ICD-10-CM | POA: Diagnosis not present

## 2022-09-24 ENCOUNTER — Ambulatory Visit (INDEPENDENT_AMBULATORY_CARE_PROVIDER_SITE_OTHER): Payer: Medicaid Other | Admitting: Internal Medicine

## 2022-09-24 ENCOUNTER — Encounter: Payer: Self-pay | Admitting: Internal Medicine

## 2022-09-24 VITALS — BP 104/66 | HR 77 | Ht 63.0 in | Wt 155.4 lb

## 2022-09-24 DIAGNOSIS — E1165 Type 2 diabetes mellitus with hyperglycemia: Secondary | ICD-10-CM

## 2022-09-24 DIAGNOSIS — Z7985 Long-term (current) use of injectable non-insulin antidiabetic drugs: Secondary | ICD-10-CM

## 2022-09-24 DIAGNOSIS — E1142 Type 2 diabetes mellitus with diabetic polyneuropathy: Secondary | ICD-10-CM | POA: Diagnosis not present

## 2022-09-24 DIAGNOSIS — Z794 Long term (current) use of insulin: Secondary | ICD-10-CM | POA: Diagnosis not present

## 2022-09-24 DIAGNOSIS — E785 Hyperlipidemia, unspecified: Secondary | ICD-10-CM | POA: Diagnosis not present

## 2022-09-24 LAB — POCT GLYCOSYLATED HEMOGLOBIN (HGB A1C): Hemoglobin A1C: 14.2 % — AB (ref 4.0–5.6)

## 2022-09-24 LAB — MICROALBUMIN / CREATININE URINE RATIO
Creatinine,U: 21.7 mg/dL
Microalb Creat Ratio: 3.2 mg/g (ref 0.0–30.0)
Microalb, Ur: <0.7 mg/dL (ref 0.0–1.9)

## 2022-09-24 LAB — GLUCOSE, POCT (MANUAL RESULT ENTRY): POC Glucose: 571 mg/dL — AB (ref 70–99)

## 2022-09-24 NOTE — Patient Instructions (Addendum)
Please continue: - Lantus 30-40 units 2x a day   Try to increase: - Ozempic 0.5 mg weekly - Humalog 30-35 units 2-3x a day, before meals  Look up: - Omnipod 5 - Tandem t:slim X2 - iLet pump Which work with Dexcom sensor.  Come back for labs when sugars <180.  Please return in 1.5 months.

## 2022-09-24 NOTE — Progress Notes (Signed)
Patient ID: Anita Massey, female   DOB: Aug 25, 1980, 42 y.o.   MRN: [id number]  HPI: Anita Massey is a 42 y.o.-year-old female, self-referred, for management of DM2, dx in 2020, insulin-dependent since 2022, uncontrolled, with long term complications (PN).  Her mother Anita Massey) is also my patient.  Last visit 4 months ago. She previously saw Dr. Roanna Raider.  Interim history: She has increased urination, blurry vision, no nausea, chest pain. Also, fatigue. At last visit, she was not sure if she can continue her Medicaid beyond the following month. She now continues on M'aid. CBG today in the office: 571.  Reviewed HbA1c: Lab Results  Component Value Date   HGBA1C 11.1 (A) 05/29/2022   HGBA1C 9.7 (A) 02/28/2022   HGBA1C 9.5 (A) 12/28/2021   HGBA1C 9.9 (A) 10/19/2021   HGBA1C 8.9 (A) 05/11/2021   HGBA1C 9.7 (A) 02/16/2021   HGBA1C 9.3 (A) 08/28/2020   HGBA1C 9.9 (H) 03/28/2020   HGBA1C 5.9 12/26/2017   HGBA1C 5.5 02/10/2013  11/23/2021 (records from [place] accessed by pt. During the appt.): HbA1c calculated from fructosamine: 8.1%  Pt is on a regimen of: - Humalog 7 to 14  >> 26 units before every meal (2x a day) - Trulicity 0.75 mg weekly >> Ozempic 0.25 mg weekly - Lantus 15 >>... 40 units  in am (for the last week) or at bedtime >> 30 units at bedtime She was not able to tolerate metformin due to nausea but also tingling. She was not able to tolerate higher doses of Trulicity due to stool incontinence. She was previously on Farxiga 10 mg before breakfast >> stopped in 2024 b/c not covered  She is sugars more than 4 times a day with her freestyle libre CGM -approved by her insurance:  Previously:  Previously:   Lowest sugar was 77 >> 60s >> 50s; hypoglycemia awareness at 100. Highest sugar was 600s >> HI >> HI.  Glucometer: Accu-Chek guide  Pt's meals are: - Breakfast: oatmeal, cereal, eggs - Lunch: sandwich, pizza - Dinner: chicken or other meat, veggies, salad,  baked potatoes - Snacks: 2 Stopped sweet tea and sodas  -only has these rarely now.  - no CKD, last BUN/creatinine:  11/23/2021: 17/0.62, GFR 114, Glu 171 Lab Results  Component Value Date   BUN 15 08/28/2020   BUN 11 03/28/2020   CREATININE 0.68 08/28/2020   CREATININE 0.64 03/28/2020   Lab Results  Component Value Date   MICRALBCREAT 11.0 03/28/2020  She is not on ACE inhibitor/ARB.  -+ HL; last set of lipids: Lab Results  Component Value Date   CHOL 265 (H) 02/28/2022   HDL 51.10 02/28/2022   LDLCALC 190 (H) 02/28/2022   TRIG 117.0 02/28/2022   CHOLHDL 5 02/28/2022  She was not on a statin but I advised her to start Lipitor 40 mg daily at last visit.  - last eye exam was 2024 >> No DR reportedly.Elevated IO pressure. On eye drops. Dr. Sherryll Burger.  - + numbness and tingling in her feet.  Last foot exam was at last visit, in 02/2022.  Pt has FH of DM in mother (who is also my patient), and maternal side of the family.  She has a history of yeast infections for which she intermittently takes  Diflucan. She also has history of HTN, microscopic hematuria, elevated liver enzymes, GERD, obesity (in the past, she was weighing over 200 pounds), anemia, rosacea, homozygosity of MTHFR variant, anxiety.  ROS: + See HPI  Past Medical History:  Diagnosis Date   Anemia    Anxiety state, unspecified    Blood dyscrasia    PROTHROMBIN G 16109 HETEROZYGOSITY (FACTOR 2)   Clotting disorder (HCC)    Diabetes mellitus without complication (HCC)    GERD (gastroesophageal reflux disease)    OCC   Homozygous for MTHFR gene mutation    Hypertension    Hypothyroidism    Multiple joint pain 09/04/2015   Obesity    Rosacea    Undiagnosed cardiac murmurs    Unspecified essential hypertension    Past Surgical History:  Procedure Laterality Date   APPEN[facility]CTOMY  2009   [facility]   CESAREAN SECTION  2016   CESAREAN SECTION N/A 11/15/2018   Procedure: REPEAT CESAREAN SECTION;  Surgeon: Natale Milch, MD;  Location: [facility] ORS;  Service: Obstetrics;  Laterality: N/A;  Female born @ [birth year] Apgars: 8/9 Weight: 6lbs 10 ozs   CHOLECYSTECTOMY N/A 03/20/2016   Procedure: LAPAROSCOPIC CHOLECYSTECTOMY;  Surgeon: Leafy Ro, MD;  Location: [facility] ORS;  Service: General;  Laterality: N/A;   DILATION AND CURETTAGE OF UTERUS     Social History   Socioeconomic History   Marital status: Married    Spouse name: Cristal Deer   Number of children: 3   Years of education: Not on file   Highest education level: Not on file  Occupational History   Occupation: Homemaker  Tobacco Use   Smoking status: Former    Current packs/day: 0.00    Average packs/day: 0.3 packs/day for 1 year (0.3 ttl pk-yrs)    Types: Cigarettes    Start date: 03/13/1997    Quit date: 03/13/1998    Years since quitting: 24.5   Smokeless tobacco: Never  Vaping Use   Vaping status: Never Used  Substance and Sexual Activity   Alcohol use: No   Drug use: No   Sexual activity: Not Currently    Birth control/protection: I.U.D.    Comment: Mirena   Other Topics Concern   Not on file  Social History Narrative          Social Determinants of Health   Financial Resource Strain: Low Risk  (11/13/2018)   Overall Financial Resource Strain (CARDIA)    Difficulty of Paying Living Expenses: Not hard at all  Food Insecurity: No Food Insecurity (11/13/2018)   Hunger Vital Sign    Worried About Running Out of Food in the Last Year: Never true    Ran Out of Food in the Last Year: Never true  Transportation Needs: No Transportation Needs (11/13/2018)   PRAPARE - Administrator, Civil Service (Medical): No    Lack of Transportation (Non-Medical): No  Physical Activity: Inactive (11/13/2018)   Exercise Vital Sign    Days of Exercise per Week: 0 days    Minutes of Exercise per Session: 0 min  Stress: Stress Concern Present (11/13/2018)   [employer] of Occupational Health - Occupational Stress Questionnaire     Feeling of Stress : Very much  Social Connections: Unknown (11/13/2018)   Social Connection and Isolation Panel [NHANES]    Frequency of Communication with Friends and Family: More than three times a week    Frequency of Social Gatherings with Friends and Family: More than three times a week    Attends Religious Services: Patient declined    Database administrator or Organizations: Patient declined    Attends Banker Meetings: Patient declined    Marital Status: Married  Catering manager Violence: Not  At Risk (11/13/2018)   Humiliation, Afraid, Rape, and Kick questionnaire    Fear of Current or Ex-Partner: No    Emotionally Abused: No    Physically Abused: No    Sexually Abused: No   Current Outpatient Medications on File Prior to Visit  Medication Sig Dispense Refill   Accu-Chek Softclix Lancets lancets Use to check blood sugar 3-4 times daily 100 each 12   ALPRAZolam (XANAX) 0.25 MG tablet Take 1 tablet (0.25 mg total) by mouth 2 (two) times daily as needed for anxiety. 30 tablet 0   amLODipine (NORVASC) 10 MG tablet Take 1 tablet (10 mg total) by mouth daily. 90 tablet 3   atorvastatin (LIPITOR) 40 MG tablet Take 1 tablet (40 mg total) by mouth daily. 90 tablet 3   Continuous Blood Gluc Receiver (FREESTYLE LIBRE 2 READER) DEVI 1 each by Does not apply route daily. 1 each 3   Continuous Blood Gluc Sensor (FREESTYLE LIBRE 2 SENSOR) MISC 1 each by Does not apply route every 14 (fourteen) days. 6 each 3   fluconazole (DIFLUCAN) 150 MG tablet Take 1 tablet (150 mg total) by mouth once a week. As needed 1 tablet 1   fluticasone (FLONASE) 50 MCG/ACT nasal spray Place 2 sprays into both nostrils daily. 16 g 0   glucose blood (ACCU-CHEK GUIDE) test strip Use to check blood sugar 3-4 times daily 100 each 12   insulin glargine (LANTUS) 100 UNIT/ML injection Inject 0.3-0.4 mLs (30-40 Units total) into the skin 2 (two) times daily. 60 mL 3   insulin lispro (HUMALOG KWIKPEN) 100  UNIT/ML KwikPen Inject 8-10 Units into the skin 3 (three) times daily before meals. Inject 15 min before a meal. 30 mL 3   Insulin Pen Needle 32G X 4 MM MISC Use 4x a day 300 each 3   ketoconazole (NIZORAL) 2 % cream Apply 1 application topically in the morning and at bedtime. 60 g 5   nystatin (MYCOSTATIN/NYSTOP) powder Apply 1 Application topically 3 (three) times daily. 15 g 0   ondansetron (ZOFRAN ODT) 4 MG disintegrating tablet Take 1 tablet (4 mg total) by mouth every 8 (eight) hours as needed for nausea or vomiting. 20 tablet 0   Semaglutide,0.25 or 0.5MG /DOS, 2 MG/3ML SOPN Inject 0.5 mg into the skin once a week. 9 mL 1   sertraline (ZOLOFT) 100 MG tablet Take 2 tablets (200 mg total) by mouth daily. 180 tablet 3   terconazole (TERAZOL 7) 0.4 % vaginal cream Place 1 applicator vaginally at bedtime. 45 g 0   No current facility-administered medications on file prior to visit.   Allergies  Allergen Reactions   Biaxin [Clarithromycin] Hives   Metformin And Related Nausea And Vomiting    Also bad tingling   Toradol [Ketorolac Tromethamine]     Chest pain    Prednisone Anxiety and Palpitations    "Makes me feel crazy"   Morphine And Codeine     Chest Pain    Trulicity [Dulaglutide] Diarrhea    Bowel incontinence on high dose, tolerating 0.75 mg ok per patient   Tape Rash    Paper tape is fine   Family History  Problem Relation Age of Onset   Mitral valve prolapse Mother    Arrhythmia Mother    Hypertension Mother    Diabetes Mother    Hypertension Father    Hyperlipidemia Father    Heart disease Maternal Grandfather    Lung cancer Maternal Grandfather    Cancer Maternal Grandfather  Diabetes Brother    Heart disease Maternal Aunt    Diabetes Maternal Aunt    Depression Maternal Aunt    Cancer Maternal Grandmother        ovarian melanoma   Diabetes Maternal Grandmother    Diabetes Maternal Uncle    PE: BP 104/66   Pulse 77   Ht 5\' 3"  (1.6 m)   Wt 155 lb 6.4 oz  (70.5 kg)   SpO2 99%   BMI 27.53 kg/m  Wt Readings from Last 3 Encounters:  09/24/22 155 lb 6.4 oz (70.5 kg)  06/27/22 163 lb (73.9 kg)  05/29/22 167 lb 3.2 oz (75.8 kg)   Constitutional: normal weight, in NAD Eyes: EOMI, no exophthalmos ENT: no thyromegaly, no cervical lymphadenopathy Cardiovascular: RRR, No MRG Respiratory: CTA B Musculoskeletal: no deformities Skin: no rashes Neurological: + Mild tremor with outstretched hands  ASSESSMENT: 1. DM2, insulin-dependent, uncontrolled, with complications -Peripheral neuropathy  2. HL  PLAN:  1. Patient will controlled diabetes, on basal/bolus insulin regimen and weekly GLP-1 receptor agonist, with extremely poor control.  At last visit, HbA1c was 11.1%, higher. -We have not be able to check her for type 1 diabetes due to the very high blood sugars, but she is treated as such. -At that time, reviewing the CGM trends, sugars appears to be very high at all times of the day but especially after meals.  We increased her Lantus dose and I advised her that if the sugars remained elevated, to also increase the Humalog doses.  We continued the same Ozempic dose.  I strongly recommended an insulin pump.  I advised her to look up OmniPod 5 and tandem t:slim pump which worked with the [employer] sensor.  For her yeast infection, I did not recommend further fluconazole treatment but I recommended terconazole and sent a prescription for this to the pharmacy. CGM interpretation: -At today's visit, we reviewed her CGM downloads: It appears that 0% of values are in target range (goal >70%), while 100% are higher than 180 (goal <25%), and 0% are lower than 70 (goal <4%).  The calculated average blood sugar is 373.  -Reviewing the CGM trends, sugars are extremely high, mostly fluctuating above 300s without lows.  Lowest blood sugar that she is was in the 180's 3 months ago. -Upon questioning, she is taking Lantus twice a day, varying between 30 and 40 units for  each dose.  She is also varying the dose of Humalog before meals.  She does not feel that her insulin is degraded or expired.  She does keep it in the fridge.  Also, she did not feel that she is injecting in scar tissue.  However, I did recommend to change the injection sites from abdomen to the upper thighs. -However, we absolutely need to start her on an insulin pump as a continuous insulin infusion may be the only thing that could improve her blood sugars.  At today's visit, I referred her to diabetes education to patient on insulin pump.  My preference for her would be an iLet insulin pump.  For this, she does not need to calculate carbs in her meals, only to announce the meal and to mention the size of the meal.  She agrees with this.  Also, we need to switch from the freestyle libre CGM to the Dexcom G7 CGM.  I really hope that the system is covered by her insurance. -We discussed about coming to the clinic when blood sugars are lower than 180 to  get the C-peptide and at that time we can also check an pancreatic antibodies.  We have not been able to check her production so far, due to the very high blood sugars.  However, there is no question that she has a degree of insulin deficiency based on the blood sugar patterns.  - I suggested to:  Patient Instructions  Please continue: - Lantus 30-40 units 2x a day   Try to increase: - Ozempic 0.5 mg weekly - Humalog 30-35 units 2-3x a day, before meals  Look up: - Omnipod 5 - Tandem t:slim X2 - iLet pump Which work with Dexcom sensor.  Come back for labs when sugars <180.  Please return in 1.5 months.  - we checked her HbA1c: 14.2% (higher) - advised to check sugars at different times of the day - 4x a day, rotating check times - advised for yearly eye exams >> she is UTD - return to clinic in 1.5 months  2.HL -Reviewed latest lipid panel from 02/2022: LDL very high, the rest of the fractions at goal: Lab Results  Component Value Date    CHOL 265 (H) 02/28/2022   HDL 51.10 02/28/2022   LDLCALC 190 (H) 02/28/2022   TRIG 117.0 02/28/2022   CHOLHDL 5 02/28/2022  -After the above results returned, we started Lipitor 40 mg daily.  She tolerates this well. -Plan to check another lipid panel at next blood work.  Carlus Pavlov, MD PhD [facility]

## 2022-09-25 ENCOUNTER — Telehealth: Payer: Self-pay | Admitting: Nutrition

## 2022-09-25 ENCOUNTER — Telehealth: Payer: Self-pay

## 2022-09-25 NOTE — Telephone Encounter (Signed)
Patient needs a PA for [employer] 2 sensors

## 2022-09-25 NOTE — Telephone Encounter (Signed)
Discussed the insulin pump that DR. Gherghe wants to order for her.  She is wanting to try this.  She called me back saying she went to pick up her Josephine I[employer] 2 sensors but the pharmacy is requesting a prior auth. For this.  I called them and they said that her insurance is requesting this.  I gave her 2 samples to last a month, and hopefully she will get a pump using the Dexcom sensors.

## 2022-09-25 NOTE — Telephone Encounter (Signed)
Message has been sent to the PA team.

## 2022-09-25 NOTE — Telephone Encounter (Signed)
Patient's spouse, Anita Massey, came in to office today and picked up 2 samples of FreeStyle Libre 3 sensors.

## 2022-09-26 ENCOUNTER — Ambulatory Visit: Payer: Medicaid Other | Admitting: Internal Medicine

## 2022-09-26 ENCOUNTER — Other Ambulatory Visit (HOSPITAL_COMMUNITY): Payer: Self-pay

## 2022-09-26 ENCOUNTER — Telehealth: Payer: Self-pay | Admitting: Dietician

## 2022-09-26 ENCOUNTER — Telehealth: Payer: Self-pay

## 2022-09-26 NOTE — Telephone Encounter (Signed)
Returned patient call. Patient has a voice mail that requires a mail box number and was unable to leave a message.  Oran Rein, RD, LDN, CDCES

## 2022-09-26 NOTE — Telephone Encounter (Signed)
Pharmacy Patient Advocate Encounter   Received notification from Pt Calls Messages that prior authorization for Freestyle libre 2 sensor is required/requested.   Per test claim: PA required; PA started via CoverMyMeds. KEY [id number] . Waiting for clinical questions to populate.

## 2022-09-26 NOTE — Addendum Note (Signed)
Addended by: Clearnce Sorrel on: 09/26/2022 03:56 PM   Modules accepted: Orders

## 2022-09-30 ENCOUNTER — Ambulatory Visit: Payer: Medicaid Other | Admitting: Internal Medicine

## 2022-09-30 NOTE — Telephone Encounter (Signed)
Clinical info including chart notes and labs have been submitted

## 2022-10-01 ENCOUNTER — Encounter: Payer: Self-pay | Admitting: Internal Medicine

## 2022-10-02 ENCOUNTER — Telehealth: Payer: Self-pay | Admitting: Internal Medicine

## 2022-10-02 NOTE — Telephone Encounter (Signed)
Patient is calling to get a refill on her Dexcom product.  Patient could not tell me anything about the refill other than it was associated with her pump and she does not know the kind of pump that she has.  She stated that it was not a [employer] product.  Patient could not say if it was a Dexcom G6 or G7 product.  Patient states that she uses   [facility] [address] [place] - [address] (Ph: [phone])   And states that the refill that she is asking for may need a prior authorization.

## 2022-10-03 ENCOUNTER — Other Ambulatory Visit: Payer: Self-pay

## 2022-10-03 DIAGNOSIS — E119 Type 2 diabetes mellitus without complications: Secondary | ICD-10-CM

## 2022-10-03 MED ORDER — FREESTYLE LIBRE 2 SENSOR MISC: 1.0000 | 3 refills | Status: DC

## 2022-10-03 NOTE — Telephone Encounter (Signed)
I sent in [place] 2 sensors to [facility] in [place]

## 2022-10-04 NOTE — Telephone Encounter (Signed)
Pharmacy Patient Advocate Encounter  Received notification from [facility] that Prior Authorization for [facility] 2 has been DENIED.  No reason given; No denial letter received via Fax or CMM. It has been requested and will be uploaded to the media tab once received.   PA #/Case ID/Reference #: [id number]

## 2022-10-07 DIAGNOSIS — H04123 Dry eye syndrome of bilateral lacrimal glands: Secondary | ICD-10-CM | POA: Diagnosis not present

## 2022-10-07 LAB — HM DIABETES EYE EXAM

## 2022-10-08 ENCOUNTER — Other Ambulatory Visit: Payer: Self-pay | Admitting: Internal Medicine

## 2022-10-08 ENCOUNTER — Telehealth: Payer: Self-pay | Admitting: Internal Medicine

## 2022-10-08 MED ORDER — HUMALOG KWIKPEN 200 UNIT/ML ~~LOC~~ SOPN: PEN_INJECTOR | SUBCUTANEOUS | 3 refills | Status: DC

## 2022-10-08 MED ORDER — INSULIN GLARGINE 100 UNIT/ML ~~LOC~~ SOLN: 30.0000 [IU] | Freq: Two times a day (BID) | SUBCUTANEOUS | 11 refills | Status: DC

## 2022-10-08 MED ORDER — DEXCOM G6 RECEIVER DEVI: 1.0000 | Freq: Once | 0 refills | Status: AC

## 2022-10-08 MED ORDER — DEXCOM G6 TRANSMITTER MISC: 1.0000 | 3 refills | Status: DC

## 2022-10-08 MED ORDER — OMNIPOD 5 DEXG7G6 PODS GEN 5 MISC: 1.0000 | 3 refills | Status: DC

## 2022-10-08 MED ORDER — DEXCOM G6 SENSOR MISC: 1.0000 | 3 refills | Status: AC

## 2022-10-08 MED ORDER — OMNIPOD 5 DEXG7G6 INTRO GEN 5 KIT: 1.0000 | PACK | 0 refills | Status: DC | PRN

## 2022-10-08 NOTE — Telephone Encounter (Signed)
From the discussion with the diabetes educator, patient has been out of Lantus.  She was going to go and refill it today. As her iLet pump does not appear to be covered, I sent supplies for the OmniPod 5 and Dexcom G6 CGM to her pharmacy along with Humalog U200 pens to use for failure the OmniPod pump.  We need to use more concentrated insulin for him due to high daily insulin requirement.  Patient has an appointment with Cristy Folks tomorrow and I am hoping that we can start her on the pump.

## 2022-10-08 NOTE — Telephone Encounter (Signed)
Having blurry vision for several day and is having issues with getting her insulin pump approved. Patient wanting to speak to a member of the clinical staff.

## 2022-10-08 NOTE — Telephone Encounter (Signed)
Patient has been switched to Dexcom and Omnipod Pump.

## 2022-10-08 NOTE — Telephone Encounter (Signed)
I called and spoke with the patient. Her CGM is currently reading HIGH, she does have a glucometer at home but does not know where its at. She did mention that her readings have been 300 and above, and has been taking 40 units of Humalog and still can not get her readings down.I submitted a pump order but they text her yesterday and told her that with her insurance she can not get the iLet pump. Which pump would you like to change to? And please advise on medication/insulin change.   Current symptoms: Headache,blurred vision, diarrhea She thinks she may have a Sinus infection and a Toothache  Denies: frequent urination and increased thirst

## 2022-10-08 NOTE — Telephone Encounter (Signed)
FYI, I was holding this to see if the Denial letter was put in the chart but it has not been received yet by the PA team from the insurance.

## 2022-10-09 ENCOUNTER — Ambulatory Visit: Payer: Medicaid Other | Admitting: Internal Medicine

## 2022-10-09 ENCOUNTER — Encounter: Payer: Medicaid Other | Attending: Internal Medicine | Admitting: Nutrition

## 2022-10-09 ENCOUNTER — Encounter: Payer: Self-pay | Admitting: Internal Medicine

## 2022-10-09 VITALS — BP 104/60 | HR 80 | Temp 98.7°F | Ht 63.0 in | Wt 160.0 lb

## 2022-10-09 DIAGNOSIS — J0140 Acute pansinusitis, unspecified: Secondary | ICD-10-CM | POA: Insufficient documentation

## 2022-10-09 MED ORDER — AMOXICILLIN-POT CLAVULANATE 875-125 MG PO TABS: 1.0000 | ORAL_TABLET | Freq: Two times a day (BID) | ORAL | 1 refills | Status: DC

## 2022-10-09 NOTE — Assessment & Plan Note (Signed)
Tylenol, etc for headache Augmentin 875 bid x 7 days (with refill) flonase

## 2022-10-09 NOTE — Progress Notes (Signed)
Subjective:    Patient ID: Anita Massey, female    DOB: [birth year], 42 y.o.   MRN: [id number]  HPI Here for headache and sinus symptoms  Bad headaches for over a week--worst ever Blowing out some green/yellow mucus---with blood Wet drainage from ears Coughing up chunks of stuff No fever, chills or sweats No SOB--other than the nasal stuffiness Tooth pain---went to dentist yesterday. No findings  Current Outpatient Medications on File Prior to Visit  Medication Sig Dispense Refill   Accu-Chek Softclix Lancets lancets Use to check blood sugar 3-4 times daily 100 each 12   ALPRAZolam (XANAX) 0.25 MG tablet Take 1 tablet (0.25 mg total) by mouth 2 (two) times daily as needed for anxiety. 30 tablet 0   amLODipine (NORVASC) 10 MG tablet Take 1 tablet (10 mg total) by mouth daily. 90 tablet 3   atorvastatin (LIPITOR) 40 MG tablet Take 1 tablet (40 mg total) by mouth daily. 90 tablet 3   Continuous Glucose Sensor (DEXCOM G6 SENSOR) MISC Inject 1 Device into the skin continuous for 10 days. 9 each 3   Continuous Glucose Transmitter (DEXCOM G6 TRANSMITTER) MISC 1 Device by Does not apply route every 3 (three) months. 1 each 3   dorzolamide-timolol (COSOPT) 2-0.5 % ophthalmic solution 1 drop 2 (two) times daily.     fluconazole (DIFLUCAN) 150 MG tablet Take 1 tablet (150 mg total) by mouth once a week. As needed 1 tablet 1   fluticasone (FLONASE) 50 MCG/ACT nasal spray Place 2 sprays into both nostrils daily. 16 g 0   glucose blood (ACCU-CHEK GUIDE) test strip Use to check blood sugar 3-4 times daily 100 each 12   HUMALOG KWIKPEN 200 UNIT/ML KwikPen Use up to 300 units insulin a day in the insulin pump - needs U200 due to high insulin dose requirements 60 mL 3   Insulin Disposable Pump (OMNIPOD 5 G6 INTRO, GEN 5,) KIT 1 each by Does not apply route as needed. 1 kit 0   Insulin Disposable Pump (OMNIPOD 5 G6 PODS, GEN 5,) MISC 1 each by Does not apply route every other day. 45 each 3   insulin  glargine (LANTUS) 100 UNIT/ML injection Inject 0.3-0.4 mLs (30-40 Units total) into the skin 2 (two) times daily. 15 mL 11   Insulin Pen Needle 32G X 4 MM MISC Use 4x a day 300 each 3   ketoconazole (NIZORAL) 2 % cream Apply 1 application topically in the morning and at bedtime. 60 g 5   nystatin (MYCOSTATIN/NYSTOP) powder Apply 1 Application topically 3 (three) times daily. 15 g 0   ondansetron (ZOFRAN ODT) 4 MG disintegrating tablet Take 1 tablet (4 mg total) by mouth every 8 (eight) hours as needed for nausea or vomiting. 20 tablet 0   Semaglutide,0.25 or 0.5MG /DOS, 2 MG/3ML SOPN Inject 0.5 mg into the skin once a week. 9 mL 1   sertraline (ZOLOFT) 100 MG tablet Take 2 tablets (200 mg total) by mouth daily. 180 tablet 3   terconazole (TERAZOL 7) 0.4 % vaginal cream Place 1 applicator vaginally at bedtime. 45 g 0   No current facility-administered medications on file prior to visit.    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)   Clotting disorder (HCC)    Diabetes mellitus without complication (HCC)    GERD (gastroesophageal reflux disease)    OCC   Homozygous for MTHFR gene mutation    Hypertension    Hypothyroidism    Multiple joint pain 09/04/2015   Obesity    Rosacea    Undiagnosed cardiac murmurs    Unspecified essential hypertension     Past Surgical History:  Procedure Laterality Date   APPENDECTOMY  2009   [facility]   CESAREAN SECTION  2016    CESAREAN SECTION N/A 11/15/2018   Procedure: REPEAT CESAREAN SECTION;  Surgeon: Natale Milch, MD;  Location: [facility] ORS;  Service: Obstetrics;  Laterality: N/A;  Female born @ [birth year] Apgars: 8/9 Weight: 6lbs 10 ozs   CHOLECYSTECTOMY N/A 03/20/2016   Procedure: LAPAROSCOPIC CHOLECYSTECTOMY;  Surgeon: Leafy Ro, MD;  Location: [facility] ORS;  Service: General;  Laterality: N/A;   DILATION AND CURETTAGE OF UTERUS      Family History  Problem Relation Age of Onset   Mitral valve prolapse Mother    Arrhythmia Mother    Hypertension Mother    Diabetes Mother    Hypertension Father    Hyperlipidemia Father    Heart disease Maternal Grandfather    Lung cancer Maternal Grandfather    Cancer Maternal Grandfather    Diabetes Brother    Heart disease Maternal Aunt    Diabetes Maternal Aunt    Depression Maternal Aunt    Cancer Maternal Grandmother        ovarian melanoma   Diabetes Maternal Grandmother    Diabetes Maternal Uncle     Social History   Socioeconomic History   Marital status: Married    Spouse name: Cristal Deer   Number of children: 3   Years of education: Not on file   Highest education level: Not on file  Occupational History   Occupation: Homemaker  Tobacco Use   Smoking status: Former    Current packs/day: 0.00    Average packs/day: 0.3 packs/day for 1 year (0.3 ttl pk-yrs)    Types: Cigarettes    Start date: 03/13/1997    Quit date: 03/13/1998    Years since quitting: 24.5   Smokeless tobacco: Never  Vaping Use   Vaping status: Never Used  Substance and Sexual Activity   Alcohol use: No   Drug use: No   Sexual activity: Not Currently    Birth control/protection: I.U.D.    Comment: Mirena   Other Topics Concern   Not on file  Social History Narrative          Social Determinants of Health   Financial Resource Strain: Low Risk  (11/13/2018)   Overall Financial Resource Strain (CARDIA)    Difficulty of Paying Living Expenses: Not hard at all  Food  Insecurity: No Food Insecurity (11/13/2018)   Hunger Vital Sign    Worried About Running Out of Food in the Last Year: Never true    Ran Out of Food in the Last Year: Never true  Transportation Needs: No Transportation Needs (11/13/2018)   PRAPARE - Transportation  Lack of Transportation (Medical): No    Lack of Transportation (Non-Medical): No  Physical Activity: Inactive (11/13/2018)   Exercise Vital Sign    Days of Exercise per Week: 0 days    Minutes of Exercise per Session: 0 min  Stress: Stress Concern Present (11/13/2018)   [employer] of Occupational Health - Occupational Stress Questionnaire    Feeling of Stress : Very much  Social Connections: Unknown (11/13/2018)   Social Connection and Isolation Panel [NHANES]    Frequency of Communication with Friends and Family: More than three times a week    Frequency of Social Gatherings with Friends and Family: More than three times a week    Attends Religious Services: Patient declined    Database administrator or Organizations: Patient declined    Attends Banker Meetings: Patient declined    Marital Status: Married  Catering manager Violence: Not At Risk (11/13/2018)   Humiliation, Afraid, Rape, and Kick questionnaire    Fear of Current or Ex-Partner: No    Emotionally Abused: No    Physically Abused: No    Sexually Abused: No   Review of Systems Vision has been blurry---sugars are very high Will be starting insulin pump    Objective:   Physical Exam Constitutional:      Appearance: She is well-developed.  HENT:     Head:     Comments: Mild maxillary and frontal tenderness    Right Ear: Tympanic membrane and ear canal normal.     Left Ear: Tympanic membrane and ear canal normal.     Mouth/Throat:     Pharynx: No oropharyngeal exudate or posterior oropharyngeal erythema.  Neck:     Comments: Non tender bilateral anterior cervical nodes Pulmonary:     Effort: Pulmonary effort is normal.      Breath sounds: Normal breath sounds. No wheezing or rales.  Musculoskeletal:     Cervical back: Neck supple.  Neurological:     Mental Status: She is alert.            Assessment & Plan:

## 2022-10-09 NOTE — Telephone Encounter (Signed)
Denial letter has been received and uploaded to pt chart.

## 2022-10-10 ENCOUNTER — Telehealth: Payer: Self-pay | Admitting: Internal Medicine

## 2022-10-10 ENCOUNTER — Telehealth: Payer: Self-pay | Admitting: Dietician

## 2022-10-10 ENCOUNTER — Telehealth: Payer: Self-pay | Admitting: Nutrition

## 2022-10-10 ENCOUNTER — Other Ambulatory Visit: Payer: Self-pay | Admitting: Internal Medicine

## 2022-10-10 MED ORDER — VALACYCLOVIR HCL 1 G PO TABS: 2000.0000 mg | ORAL_TABLET | Freq: Every day | ORAL | 0 refills | Status: AC

## 2022-10-10 MED ORDER — INSULIN LISPRO 100 UNIT/ML IJ SOLN: INTRAMUSCULAR | 11 refills | Status: DC

## 2022-10-10 MED ORDER — TIZANIDINE HCL 2 MG PO CAPS: 2.0000 mg | ORAL_CAPSULE | Freq: Three times a day (TID) | ORAL | 0 refills | Status: DC | PRN

## 2022-10-10 NOTE — Progress Notes (Signed)
Patient is here with her grandmother to start the OmniPod 5.  We set up her OmniPod account: Anita Massey,  PW: [id number].   This was then linked to [facility]  She was also trained on how to use the dexcom G6 sensors.  The sensor was started in her right arem and was linked to the PDM and to her phone.  Clarity app was started and linked to Anita Massey The readings will not appear for 2 hours on the PDM due to a warm up period.  She was taking Lantus 60u bid and Humalog 40u TID.  Her TDD was 240 minus 20% is 196.  88u is basal insulin so basal was 3.6u.  Since she is on U-200 the basal rate was set 1.8u/hr.  Her I/C is 1 and she was told to give 20u for meals.  ISF was set to 100,  target was 150 with correction over 150.  She was trained on how to give a bolus and how and and when to do correction doses.  She re demonstrated this X3 without difficulty.  This process took 2.5hours an the patient was needing to pick up her children from school.  I will call her tomorrow to see how she is doing, and will will finish training on Tuesday.  She filled a pod with Humalog U200u and attached this to her right abdomen.  Sensor was still not linked with 10 minutes more before start up time was completed.

## 2022-10-10 NOTE — Telephone Encounter (Signed)
Patient reported that blood sugars dropped low at 7PM last night and she ate a second meal.  Also low this AM and again now.  Said at [facility] it was 70, ate cereal to 20u, had soda, and 8ounces of juice.  Blood sugar still low now after treating many more times.  It is now 18 wiith 5u of IOB She put the pump in stop per Laura's advise for 30 minutes.  She was told to continue to leave it in stop for another 30 minutes.   We reduced her basal rate 0.3u/hr and she was told to take only 3u for meals and 0.5u for snacks.  She was told now to eat another bowl of cereal and have one more can of regular soda.  She agreed to do this.

## 2022-10-10 NOTE — Telephone Encounter (Signed)
Spoke to pt's mother per DPR.

## 2022-10-10 NOTE — Telephone Encounter (Signed)
Patient reports that she drank more juice and had another bowl of sweetened cereal and 2 donuts.  Blood sugar now 192 with 1.4u of IOB.  Patient was talked into changing her pod using U-100 insulin.  Basal rate changed to 0.6u/hr, and ISF: 50, I/C: 1,  with 5u per meal and 1u for snack.   I will call her tonight to see how she is doing.

## 2022-10-10 NOTE — Telephone Encounter (Signed)
Anita Massey, I called this in for her. Ty! C

## 2022-10-10 NOTE — Patient Instructions (Signed)
Read over manuel on line Read over starter booklet  Call OmniPod if dexcom does not link to PDM Bolus 20u for all meals and  1u for snacks

## 2022-10-10 NOTE — Telephone Encounter (Signed)
Patient called this am. She reports that she was trained on the Omnipod 5 yesterday. She did not eat much all day until dinner and ate 1 hotdog and 1 corndog.  She bolused 20 units for the meal and they had a low that evening of 57.  She treated this but stated that it was difficult to get her blood glucose back up.  Reviewed the rule of 15 with patient and that she should take only fast acting sugar until her blood glucose is above 70.   She states that her sensor reading this am was 70.  She ate cheerios and drank juice.  Her sensor reading is now 190.  She wonders if she should bolus.  Instructed her to bolus 10 units for her meal. Forwarding message to Almyra Free and RN who trained her to advise.  Oran Rein, RD, LDN, CDCES

## 2022-10-10 NOTE — Telephone Encounter (Signed)
Pt called stating she now has a cold sore & was wondering if Dr. Alphonsus Sias would prescribe her valtrex? Pt also mentioned some back pain she's experiencing as well, pt asked does she need to be evaluated for that issue? Call back # [phone]

## 2022-10-10 NOTE — Addendum Note (Signed)
Addended by: Tillman Abide I on: 10/10/2022 11:23 AM   Modules accepted: Orders

## 2022-10-11 ENCOUNTER — Telehealth: Payer: Self-pay

## 2022-10-11 NOTE — Telephone Encounter (Signed)
Pharmacy Patient Advocate Encounter   Received notification from [employer] that prior authorization for Dexcom G6 sensor is required/requested.    Per test claim: PA required; PA started via CoverMyMeds. KEY [id number] . Waiting for clinical questions to populate.

## 2022-10-14 ENCOUNTER — Other Ambulatory Visit (HOSPITAL_COMMUNITY): Payer: Self-pay

## 2022-10-15 ENCOUNTER — Telehealth: Payer: Self-pay | Admitting: Nutrition

## 2022-10-15 ENCOUNTER — Ambulatory Visit: Payer: Medicaid Other | Admitting: Nutrition

## 2022-10-15 NOTE — Telephone Encounter (Signed)
Clinical info including chart notes and labs have been submitted

## 2022-10-15 NOTE — Telephone Encounter (Signed)
Call to see if we can schedule her this week for pump review.  Pt. Could not come this week.  Scheduled for next Tuesday.

## 2022-10-16 NOTE — Telephone Encounter (Signed)
Pharmacy Patient Advocate Encounter  Received notification from [facility] that Prior Authorization for Dexcom G6 sensor has been APPROVED through 04/15/2023   PA #/Case ID/Reference #: [id number]

## 2022-10-22 ENCOUNTER — Encounter: Payer: Medicaid Other | Attending: Internal Medicine | Admitting: Nutrition

## 2022-10-22 DIAGNOSIS — E1165 Type 2 diabetes mellitus with hyperglycemia: Secondary | ICD-10-CM | POA: Insufficient documentation

## 2022-10-22 DIAGNOSIS — E1142 Type 2 diabetes mellitus with diabetic polyneuropathy: Secondary | ICD-10-CM | POA: Diagnosis not present

## 2022-10-22 NOTE — Patient Instructions (Signed)
Bolus BEFORE all meals Use sensor reading for every bolus Keep pump in automated mode.  Pt. Showed me how to put pump in automated mode if she is kicked out. Do correction boluses for all blood sugars over 250.  She redemonstrated this correctly for me as well

## 2022-10-22 NOTE — Progress Notes (Signed)
Download shows patient in target range only 9% of time. 61% very high range.  Patient not bolusing before meals and waiting 1 hour after eating before bolusing.  Also Pt. not in automode due to exit from highs.  One day no bolus and no correction doses.   Discussed why need to bolus before meals and why need to do correction doses as well.  Download shown to Dr. Elvera Lennox   Pump setting changes: Basal rate increased to 1.1u/hr, and Boluses:  breakfast: 12-14u, (due to eating cereal), Lunch and supper boluses: 7-10,  target: 120 with corrections over 120.   Written instructions given:  Bolus BEFORE all meals Use sensor reading for every bolus Keep pump in automated mode.  Pt. Showed me how to put pump in automated mode if she is kicked out. Do correction boluses for all blood sugars over 250.  She redemonstrated this correctly for me as well

## 2022-10-23 NOTE — Telephone Encounter (Signed)
Opened in error

## 2022-11-05 ENCOUNTER — Encounter: Payer: Medicaid Other | Admitting: Internal Medicine

## 2022-11-06 ENCOUNTER — Ambulatory Visit (INDEPENDENT_AMBULATORY_CARE_PROVIDER_SITE_OTHER): Payer: Medicaid Other | Admitting: Internal Medicine

## 2022-11-06 ENCOUNTER — Encounter: Payer: Self-pay | Admitting: Internal Medicine

## 2022-11-06 VITALS — BP 120/70 | HR 82 | Ht 63.0 in | Wt 159.2 lb

## 2022-11-06 DIAGNOSIS — E1165 Type 2 diabetes mellitus with hyperglycemia: Secondary | ICD-10-CM | POA: Diagnosis not present

## 2022-11-06 DIAGNOSIS — Z7985 Long-term (current) use of injectable non-insulin antidiabetic drugs: Secondary | ICD-10-CM | POA: Diagnosis not present

## 2022-11-06 DIAGNOSIS — E1142 Type 2 diabetes mellitus with diabetic polyneuropathy: Secondary | ICD-10-CM

## 2022-11-06 DIAGNOSIS — Z794 Long term (current) use of insulin: Secondary | ICD-10-CM | POA: Diagnosis not present

## 2022-11-06 DIAGNOSIS — E785 Hyperlipidemia, unspecified: Secondary | ICD-10-CM

## 2022-11-06 MED ORDER — OMNIPOD 5 DEXG7G6 INTRO GEN 5 KIT: 1.0000 | PACK | 0 refills | Status: DC | PRN

## 2022-11-06 MED ORDER — SEMAGLUTIDE(0.25 OR 0.5MG/DOS) 2 MG/3ML ~~LOC~~ SOPN: 0.5000 mg | PEN_INJECTOR | SUBCUTANEOUS | 3 refills | Status: DC

## 2022-11-06 MED ORDER — OMNIPOD 5 DEXG7G6 PODS GEN 5 MISC: 1.0000 | 3 refills | Status: DC

## 2022-11-06 NOTE — Progress Notes (Signed)
Patient ID: Anita Massey, female   DOB: [birth year], 42 y.o.   MRN: [id number]  HPI: Anita Massey is a 42 y.o.-year-old female, self-referred, for management of DM2, dx in 2020, insulin-dependent since 2022, uncontrolled, with long term complications (PN).  Her mother Anita Massey) is also my patient.  Last visit 1 month ago. She previously saw Dr. Roanna Raider. She has Medicaid.  Interim history: She has increased urination, blurry vision, fatigue.  No nausea, chest pain.   Insulin pump: - Omnipod 5 - started 10/09/2022, but off in last 2 days after running out of pods as she was not given a refill from the pharmacy - iLet pump was not covered by her insurance  CGM: - Dexcom G6  Insulin: - Humalog U200 initially in the pump - now Humalog U100 starting 10/10/2022  Supplies: - [facility] - [address] [place]  Reviewed HbA1c: Lab Results  Component Value Date   HGBA1C 14.2 (A) 09/24/2022   HGBA1C 11.1 (A) 05/29/2022   HGBA1C 9.7 (A) 02/28/2022   HGBA1C 9.5 (A) 12/28/2021   HGBA1C 9.9 (A) 10/19/2021   HGBA1C 8.9 (A) 05/11/2021   HGBA1C 9.7 (A) 02/16/2021   HGBA1C 9.3 (A) 08/28/2020   HGBA1C 9.9 (H) 03/28/2020   HGBA1C 5.9 12/26/2017  11/23/2021 (records from [facility] accessed by pt. During the appt.): HbA1c calculated from fructosamine: 8.1%  At last visit she was on: - Humalog 7 to 14  >> 26 units before every meal (2x a day) - Trulicity 0.75 mg weekly >> Ozempic 0.25 mg weekly - Lantus 15 >>... 40 units  in am (for the last week) or at bedtime >> 30 units at bedtime She was not able to tolerate metformin due to nausea but also tingling. She was not able to tolerate higher doses of Trulicity due to stool incontinence. She was previously on Farxiga 10 mg before breakfast >> stopped in 2024 b/c not covered  We changed to: OmniPod 5 insulin pump with Dexcom CGM: - Basal rates: 12 am: 1.1 units/h - Insulin to carb ratio: 12 am: 1:1 - Target: 12 am: 120-120 -  Correction factor (insulin sensitivity factor):  12 am:  50 - Active insulin time: 4h - Changes infusion site: q2-3 days  Total daily dose from basal insulin: 66% Total daily dose from bolus insulin: 34% TDD: 53-70 units per day  She checks sugars more than 4 times a day with her CGM:    Previously:  Previously:   Lowest sugar was 77 >> 60s >> 50s >> s; hypoglycemia awareness at 100. Highest sugar was 600s >> HI >> HI >> HI.  Glucometer: Accu-Chek guide  Pt's meals are: - Breakfast: oatmeal, cereal, eggs - Lunch: sandwich, pizza - Dinner: chicken or other meat, veggies, salad, baked potatoes - Snacks: 2 Stopped sweet tea and sodas  -only has these rarely now.  - no CKD, last BUN/creatinine:  11/23/2021: 17/0.62, GFR 114, Glu 171 Lab Results  Component Value Date   BUN 15 08/28/2020   BUN 11 03/28/2020   CREATININE 0.68 08/28/2020   CREATININE 0.64 03/28/2020   Lab Results  Component Value Date   MICRALBCREAT 3.2 09/24/2022   MICRALBCREAT 11.0 03/28/2020  She is not on ACE inhibitor/ARB.  -+ HL; last set of lipids: Lab Results  Component Value Date   CHOL 265 (H) 02/28/2022   HDL 51.10 02/28/2022   LDLCALC 190 (H) 02/28/2022   TRIG 117.0 02/28/2022   CHOLHDL 5 02/28/2022  In 04/2022, we started Lipitor  40 mg daily.  - last eye exam was 2024 >> No DR reportedly.Elevated IO pressure. On eye drops. Dr. Sherryll Burger.  - + numbness and tingling in her feet.  Last foot exam was at last visit, in 02/2022.  Pt has FH of DM in mother (who is also my patient), and maternal side of the family.  She has a history of yeast infections for which she intermittently takes  Diflucan. She also has history of HTN, microscopic hematuria, elevated liver enzymes, GERD, obesity (in the past, she was weighing over 200 pounds), anemia, rosacea, homozygosity of MTHFR variant, anxiety.  ROS: + See HPI  Past Medical History:  Diagnosis Date   Anemia    Anxiety state, unspecified     Blood dyscrasia    PROTHROMBIN G 37169 HETEROZYGOSITY (FACTOR 2)   Clotting disorder (HCC)    Diabetes mellitus without complication (HCC)    GERD (gastroesophageal reflux disease)    OCC   Homozygous for MTHFR gene mutation    Hypertension    Hypothyroidism    Multiple joint pain 09/04/2015   Obesity    Rosacea    Undiagnosed cardiac murmurs    Unspecified essential hypertension    Past Surgical History:  Procedure Laterality Date   APPENDECTOMY  2009   [facility]   CESAREAN SECTION  2016   CESAREAN SECTION N/A 11/15/2018   Procedure: REPEAT CESAREAN SECTION;  Surgeon: Natale Milch, MD;  Location: [facility] ORS;  Service: Obstetrics;  Laterality: N/A;  Female born @ [birth year] Apgars: 8/9 Weight: 6lbs 10 ozs   CHOLECYSTECTOMY N/A 03/20/2016   Procedure: LAPAROSCOPIC CHOLECYSTECTOMY;  Surgeon: Leafy Ro, MD;  Location: [facility] ORS;  Service: General;  Laterality: N/A;   DILATION AND CURETTAGE OF UTERUS     Social History   Socioeconomic History   Marital status: Married    Spouse name: Cristal Deer   Number of children: 3   Years of education: Not on file   Highest education level: Not on file  Occupational History   Occupation: Homemaker  Tobacco Use   Smoking status: Former    Current packs/day: 0.00    Average packs/day: 0.3 packs/day for 1 year (0.3 ttl pk-yrs)    Types: Cigarettes    Start date: 03/13/1997    Quit date: 03/13/1998    Years since quitting: 24.6   Smokeless tobacco: Never  Vaping Use   Vaping status: Never Used  Substance and Sexual Activity   Alcohol use: No   Drug use: No   Sexual activity: Not Currently    Birth control/protection: I.U.D.    Comment: Mirena   Other Topics Concern   Not on file  Social History Narrative          Social Determinants of Health   Financial Resource Strain: Low Risk  (11/13/2018)   Overall Financial Resource Strain (CARDIA)    Difficulty of Paying Living Expenses: Not hard at all  Food Insecurity: No Food Insecurity  (11/13/2018)   Hunger Vital Sign    Worried About Running Out of Food in the Last Year: Never true    Ran Out of Food in the Last Year: Never true  Transportation Needs: No Transportation Needs (11/13/2018)   PRAPARE - Administrator, Civil Service (Medical): No    Lack of Transportation (Non-Medical): No  Physical Activity: Inactive (11/13/2018)   Exercise Vital Sign    Days of Exercise per Week: 0 days    Minutes of Exercise per Session: 0  min  Stress: Stress Concern Present (11/13/2018)   [employer] of Occupational Health - Occupational Stress Questionnaire    Feeling of Stress : Very much  Social Connections: Unknown (11/13/2018)   Social Connection and Isolation Panel [NHANES]    Frequency of Communication with Friends and Family: More than three times a week    Frequency of Social Gatherings with Friends and Family: More than three times a week    Attends Religious Services: Patient declined    Database administrator or Organizations: Patient declined    Attends Banker Meetings: Patient declined    Marital Status: Married  Catering manager Violence: Not At Risk (11/13/2018)   Humiliation, Afraid, Rape, and Kick questionnaire    Fear of Current or Ex-Partner: No    Emotionally Abused: No    Physically Abused: No    Sexually Abused: No   Current Outpatient Medications on File Prior to Visit  Medication Sig Dispense Refill   Accu-Chek Softclix Lancets lancets Use to check blood sugar 3-4 times daily 100 each 12   ALPRAZolam (XANAX) 0.25 MG tablet Take 1 tablet (0.25 mg total) by mouth 2 (two) times daily as needed for anxiety. 30 tablet 0   amLODipine (NORVASC) 10 MG tablet Take 1 tablet (10 mg total) by mouth daily. 90 tablet 3   amoxicillin-clavulanate (AUGMENTIN) 875-125 MG tablet Take 1 tablet by mouth 2 (two) times daily. 14 tablet 1   atorvastatin (LIPITOR) 40 MG tablet Take 1 tablet (40 mg total) by mouth daily. 90 tablet 3   Continuous  Glucose Transmitter (DEXCOM G6 TRANSMITTER) MISC 1 Device by Does not apply route every 3 (three) months. 1 each 3   dorzolamide-timolol (COSOPT) 2-0.5 % ophthalmic solution 1 drop 2 (two) times daily.     fluconazole (DIFLUCAN) 150 MG tablet Take 1 tablet (150 mg total) by mouth once a week. As needed 1 tablet 1   fluticasone (FLONASE) 50 MCG/ACT nasal spray Place 2 sprays into both nostrils daily. 16 g 0   glucose blood (ACCU-CHEK GUIDE) test strip Use to check blood sugar 3-4 times daily 100 each 12   Insulin Disposable Pump (OMNIPOD 5 G6 INTRO, GEN 5,) KIT 1 each by Does not apply route as needed. 1 kit 0   Insulin Disposable Pump (OMNIPOD 5 G6 PODS, GEN 5,) MISC 1 each by Does not apply route every other day. 45 each 3   insulin glargine (LANTUS) 100 UNIT/ML injection Inject 0.3-0.4 mLs (30-40 Units total) into the skin 2 (two) times daily. 15 mL 11   insulin lispro (HUMALOG) 100 UNIT/ML injection Use up to 100 units in the insulin pump daily 30 mL 11   Insulin Pen Needle 32G X 4 MM MISC Use 4x a day 300 each 3   ketoconazole (NIZORAL) 2 % cream Apply 1 application topically in the morning and at bedtime. 60 g 5   nystatin (MYCOSTATIN/NYSTOP) powder Apply 1 Application topically 3 (three) times daily. 15 g 0   ondansetron (ZOFRAN ODT) 4 MG disintegrating tablet Take 1 tablet (4 mg total) by mouth every 8 (eight) hours as needed for nausea or vomiting. 20 tablet 0   Semaglutide,0.25 or 0.5MG /DOS, 2 MG/3ML SOPN Inject 0.5 mg into the skin once a week. 9 mL 1   sertraline (ZOLOFT) 100 MG tablet Take 2 tablets (200 mg total) by mouth daily. 180 tablet 3   terconazole (TERAZOL 7) 0.4 % vaginal cream Place 1 applicator vaginally at bedtime. 45 g 0  tizanidine (ZANAFLEX) 2 MG capsule Take 1 capsule (2 mg total) by mouth 3 (three) times daily as needed for muscle spasms. 20 capsule 0   valACYclovir (VALTREX) 1000 MG tablet Take 2 tablets (2,000 mg total) by mouth daily. And repeat once in 12  hours---for cold sore 20 tablet 0   No current facility-administered medications on file prior to visit.   Allergies  Allergen Reactions   Biaxin [Clarithromycin] Hives   Metformin And Related Nausea And Vomiting    Also bad tingling   Toradol [Ketorolac Tromethamine]     Chest pain    Prednisone Anxiety and Palpitations    "Makes me feel crazy"   Morphine And Codeine     Chest Pain    Trulicity [Dulaglutide] Diarrhea    Bowel incontinence on high dose, tolerating 0.75 mg ok per patient   Tape Rash    Paper tape is fine   Family History  Problem Relation Age of Onset   Mitral valve prolapse Mother    Arrhythmia Mother    Hypertension Mother    Diabetes Mother    Hypertension Father    Hyperlipidemia Father    Heart disease Maternal Grandfather    Lung cancer Maternal Grandfather    Cancer Maternal Grandfather    Diabetes Brother    Heart disease Maternal Aunt    Diabetes Maternal Aunt    Depression Maternal Aunt    Cancer Maternal Grandmother        ovarian melanoma   Diabetes Maternal Grandmother    Diabetes Maternal Uncle    PE: BP 120/70   Pulse 82   Ht 5\' 3"  (1.6 m)   Wt 159 lb 3.2 oz (72.2 kg)   SpO2 97%   BMI 28.20 kg/m  Wt Readings from Last 3 Encounters:  11/06/22 159 lb 3.2 oz (72.2 kg)  10/09/22 160 lb (72.6 kg)  09/24/22 155 lb 6.4 oz (70.5 kg)   Constitutional: normal weight, in NAD Eyes: EOMI, no exophthalmos ENT: no thyromegaly, no cervical lymphadenopathy Cardiovascular: RRR, No MRG Respiratory: CTA B Musculoskeletal: no deformities Skin: no rashes Neurological: + Mild tremor with outstretched hands  ASSESSMENT: 1. DM2, insulin-dependent, uncontrolled, with complications -Peripheral neuropathy  2. HL  PLAN:  1. Patient with very uncontrolled diabetes, previously on a basal large bolus insulin regimen and GLP-1 receptor agonist, with extremely poor control.  At last visit, glucose in the office was more than 500 and the sugars are  barely visible on the CGM printout window, evidence that they were mostly above 300s at home.  HbA1c was very high at 14.2%.  -At that time, I strongly suggested an insulin pump.  She was able to start this approximately 1 month ago-OmniPod 5 integrated with the Dexcom CGM. -Immediately after starting the pump, sugars improved to the point of lows so we had to back off from Humalog U200 to Humalog U100.  Afterwards, sugars increase again as she was not bolusing almost at all so at last visit with the diabetes educator, she was advised about bolusing before every single meal. CGM interpretation: -At today's visit, we reviewed her CGM downloads: It appears that 21% of values are in target range (goal >70%), while 79% are higher than 180 (goal <25%), and 0% are lower than 70 (goal <4%).  The calculated average blood sugar is 268.  The projected HbA1c for the next 3 months (GMI) is 9.7%. -Reviewing the CGM trends, sugars appear to be improved from before, still elevated throughout the  day up until 4 days ago, when the sugars started to improve significantly - see HPI.  We discussed that this is likely due to the pump algorithm learning her requirements.  However, unfortunately, right after this improvement, she had to come off the pump 2 days ago as she was not given pods from the pharmacy.  Unfortunately, she did not let us know so she is now only using Humalog for meals, and missing most of the doses.  She is not on Lantus.  We discussed that if she has to come off the pump in the future, she needs both Lantus and Humalog.  We will not start the long-acting insulin today, as she was given samples and I advised her to start a new pod as soon as she gets home.  I also sent a new prescription for pods to her pharmacy (she got a message from the pharmacy that she needed the intro kit, which may be a mistake, states she does not need to refill this once she is already on the pump) however, I sent both the pods and the  intro kit just to make sure that she gets them. -For now, due to the significant improvement in her blood sugars in the last 3 days of using the OmniPod, I advised her to continue the same settings. - I suggested to:  Patient Instructions  Please continue: - Ozempic 0.5 mg weekly  Please use the following pump settings: - Basal rates: 12 am:  1.1 units/h - Insulin to carb ratio: 12 am:  1:1 - Target: 12 am: 120-120 - Correction factor (insulin sensitivity factor):  12 am: 50  - Active insulin time: 4h - Changes infusion site: q3 days  Please return in 1.5 months.  - advised to check sugars at different times of the day - 4x a day, rotating check times - advised for yearly eye exams >> she is UTD - will check labs, including a C-peptide, at next visit - return to clinic in 1.5 months  2.HL -Reviewed her latest lipid panel from 02/2022: LDL very high, otherwise fractions at goal: Lab Results  Component Value Date   CHOL 265 (H) 02/28/2022   HDL 51.10 02/28/2022   LDLCALC 190 (H) 02/28/2022   TRIG 117.0 02/28/2022   CHOLHDL 5 02/28/2022  -After the above results returned, I recommended Lipitor 40 mg daily and she continues on this -Plan to repeat a level at next visit  Carlus Pavlov, MD PhD [facility]

## 2022-11-06 NOTE — Patient Instructions (Signed)
Please continue: - Ozempic 0.5 mg weekly  Please use the following pump settings: - Basal rates: 12 am:  1.1 units/h - Insulin to carb ratio: 12 am:  1:1 - Target: 12 am: 120-120 - Correction factor (insulin sensitivity factor):  12 am: 50  - Active insulin time: 4h - Changes infusion site: q3 days  Please return in 1.5 months.

## 2022-11-07 ENCOUNTER — Telehealth: Payer: Self-pay | Admitting: Dietician

## 2022-11-07 NOTE — Telephone Encounter (Signed)
Patient was trained on an Omnipod 5 on 10/09/2022.  She called as she stated that the pharmacy now states that she needs prior authorization for her pump supplies.   Will forward to the clinical staff to assist.  Oran Rein, RD, LDN, CDCES

## 2022-11-11 ENCOUNTER — Telehealth: Payer: Self-pay | Admitting: Internal Medicine

## 2022-11-11 ENCOUNTER — Encounter: Payer: Self-pay | Admitting: Internal Medicine

## 2022-11-11 ENCOUNTER — Ambulatory Visit (INDEPENDENT_AMBULATORY_CARE_PROVIDER_SITE_OTHER): Payer: Medicaid Other | Admitting: Internal Medicine

## 2022-11-11 VITALS — BP 98/70 | HR 99 | Temp 97.9°F | Ht 62.25 in | Wt 159.0 lb

## 2022-11-11 DIAGNOSIS — E1142 Type 2 diabetes mellitus with diabetic polyneuropathy: Secondary | ICD-10-CM

## 2022-11-11 DIAGNOSIS — Z794 Long term (current) use of insulin: Secondary | ICD-10-CM | POA: Diagnosis not present

## 2022-11-11 DIAGNOSIS — I10 Essential (primary) hypertension: Secondary | ICD-10-CM

## 2022-11-11 DIAGNOSIS — Z0000 Encounter for general adult medical examination without abnormal findings: Secondary | ICD-10-CM | POA: Diagnosis not present

## 2022-11-11 DIAGNOSIS — S3992XS Unspecified injury of lower back, sequela: Secondary | ICD-10-CM

## 2022-11-11 DIAGNOSIS — E1165 Type 2 diabetes mellitus with hyperglycemia: Secondary | ICD-10-CM | POA: Diagnosis not present

## 2022-11-11 DIAGNOSIS — S3992XA Unspecified injury of lower back, initial encounter: Secondary | ICD-10-CM | POA: Insufficient documentation

## 2022-11-11 DIAGNOSIS — B3731 Acute candidiasis of vulva and vagina: Secondary | ICD-10-CM | POA: Diagnosis not present

## 2022-11-11 DIAGNOSIS — F331 Major depressive disorder, recurrent, moderate: Secondary | ICD-10-CM

## 2022-11-11 LAB — LIPID PANEL
Cholesterol: 140 mg/dL (ref 0–200)
HDL: 36.8 mg/dL — ABNORMAL LOW (ref >39.00)
LDL Cholesterol: 82 mg/dL (ref 0–99)
NonHDL: 103.54
Total CHOL/HDL Ratio: 4
Triglycerides: 107 mg/dL (ref 0.0–149.0)
VLDL: 21.4 mg/dL (ref 0.0–40.0)

## 2022-11-11 LAB — CBC
HCT: 46.2 % — ABNORMAL HIGH (ref 36.0–46.0)
Hemoglobin: 15.1 g/dL — ABNORMAL HIGH (ref 12.0–15.0)
MCHC: 32.6 g/dL (ref 30.0–36.0)
MCV: 90.4 fL (ref 78.0–100.0)
Platelets: 297 10*3/uL (ref 150.0–400.0)
RBC: 5.11 Mil/uL (ref 3.87–5.11)
RDW: 13.1 % (ref 11.5–15.5)
WBC: 13.7 10*3/uL — ABNORMAL HIGH (ref 4.0–10.5)

## 2022-11-11 LAB — COMPREHENSIVE METABOLIC PANEL
ALT: 14 U/L (ref 0–35)
AST: 14 U/L (ref 0–37)
Albumin: 4.3 g/dL (ref 3.5–5.2)
Alkaline Phosphatase: 146 U/L — ABNORMAL HIGH (ref 39–117)
BUN: 14 mg/dL (ref 6–23)
CO2: 26 meq/L (ref 19–32)
Calcium: 9.7 mg/dL (ref 8.4–10.5)
Chloride: 104 meq/L (ref 96–112)
Creatinine, Ser: 0.68 mg/dL (ref 0.40–1.20)
GFR: 107.46 mL/min (ref >60.00)
Glucose, Bld: 198 mg/dL — ABNORMAL HIGH (ref 70–99)
Potassium: 4.8 meq/L (ref 3.5–5.1)
Sodium: 137 meq/L (ref 135–145)
Total Bilirubin: 0.6 mg/dL (ref 0.2–1.2)
Total Protein: 6.9 g/dL (ref 6.0–8.3)

## 2022-11-11 LAB — MICROALBUMIN / CREATININE URINE RATIO
Creatinine,U: 210.2 mg/dL
Microalb Creat Ratio: 2.3 mg/g (ref 0.0–30.0)
Microalb, Ur: 4.8 mg/dL — ABNORMAL HIGH (ref 0.0–1.9)

## 2022-11-11 LAB — HM DIABETES FOOT EXAM

## 2022-11-11 LAB — TSH: TSH: 1.8 u[IU]/mL (ref 0.35–5.50)

## 2022-11-11 LAB — T4, FREE: Free T4: 0.75 ng/dL (ref 0.60–1.60)

## 2022-11-11 MED ORDER — OMNIPOD 5 DEXG7G6 PODS GEN 5 MISC: 1.0000 | 3 refills | Status: DC

## 2022-11-11 MED ORDER — FLUCONAZOLE 150 MG PO TABS: 150.0000 mg | ORAL_TABLET | ORAL | 3 refills | Status: DC
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
## 2022-11-11 NOTE — Assessment & Plan Note (Signed)
Ongoing stress and some depression Partial remission with sertraline 200mg  daily

## 2022-11-11 NOTE — Assessment & Plan Note (Signed)
Working with Dr Ward Givens with insulin pump

## 2022-11-11 NOTE — Telephone Encounter (Signed)
Requested Prescriptions   Signed Prescriptions Disp Refills   Insulin Disposable Pump (OMNIPOD 5 DEXG7G6 PODS GEN 5) MISC 45 each 3    Sig: 1 each by Does not apply route every other day.    Authorizing Provider: Carlus Pavlov    Ordering User: Pollie Meyer

## 2022-11-11 NOTE — Telephone Encounter (Signed)
MEDICATION: 1) Omnipod 5 pods 2) Dexcom G6 Sensor  PHARMACY:    [id number] - [place], [place] - [address] (Ph: [phone])    HAS THE PATIENT CONTACTED THEIR PHARMACY?  Yes  IS THIS A 90 DAY SUPPLY : Yes  IS PATIENT OUT OF MEDICATION: Yes  IF NOT; HOW MUCH IS LEFT:   LAST APPOINTMENT DATE: @10 /23/2024  NEXT APPOINTMENT DATE:@12 /02/2022  DO WE HAVE YOUR PERMISSION TO LEAVE A DETAILED MESSAGE?: Yes  OTHER COMMENTS:    **Let patient know to contact pharmacy at the end of the day to make sure medication is ready. **  ** Please notify patient to allow 48-72 hours to process**  **Encourage patient to contact the pharmacy for refills or they can request refills through [facility]**

## 2022-11-11 NOTE — Assessment & Plan Note (Signed)
Discussed ongoing exercise Pap at her gyn Asked her to set up screening mammogram Prefers no flu/COVID vaccines

## 2022-11-11 NOTE — Assessment & Plan Note (Signed)
BP Readings from Last 3 Encounters:  11/11/22 98/70  11/06/22 120/70  10/09/22 104/60   Wll controlled on amlodipine 10
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
## 2022-11-12 ENCOUNTER — Telehealth: Payer: Self-pay

## 2022-11-12 ENCOUNTER — Other Ambulatory Visit (HOSPITAL_COMMUNITY): Payer: Self-pay

## 2022-11-12 MED ORDER — DEXCOM G7 SENSOR MISC: 3 refills | Status: DC

## 2022-11-12 MED ORDER — OMNIPOD 5 G7 PODS (GEN 5) MISC: 1.0000 | 3 refills | Status: DC

## 2022-11-12 MED ORDER — DEXCOM G6 SENSOR MISC
1.0000 | 3 refills | Status: DC
Start: 2022-11-12 — End: 2022-11-12

## 2022-11-12 MED ORDER — OMNIPOD 5 G7 INTRO (GEN 5) KIT: 1.0000 | PACK | Freq: Once | 0 refills | Status: AC

## 2022-11-12 NOTE — Telephone Encounter (Signed)
Pt needs a PA for her PODs (currently out ) Needs it URGENT

## 2022-11-12 NOTE — Telephone Encounter (Signed)
Patient was notified and I also spoke with the patient, see other message.

## 2022-11-12 NOTE — Telephone Encounter (Signed)
Please advise? She is currently on her last pod.
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
## 2022-11-12 NOTE — Telephone Encounter (Signed)
Sample  Device/Supplies: Omnipod PODs Quantity:2 (only 2 pods left in the sample box,all we have in stock) GNF:[id number]  Dicie Beam

## 2022-11-12 NOTE — Telephone Encounter (Signed)
Patient called back stating she spoke with her insurance and they advised to her that they would cover the Omnipod 5 G7, so I have sent in the sensors and pods. Can we proceed with PA for this as below.  Requested Prescriptions   Signed Prescriptions Disp Refills   Insulin Disposable Pump (OMNIPOD 5 G7 INTRO, GEN 5,) KIT 1 kit 0    Sig: 1 each by Does not apply route once for 1 dose.    Authorizing Provider: Carlus Pavlov    Ordering User: Jalexia Lalli S   Insulin Disposable Pump (OMNIPOD 5 G7 PODS, GEN 5,) MISC 45 each 3    Sig: 1 each by Does not apply route every other day.    Authorizing Provider: Carlus Pavlov    Ordering User: Riel Hirschman S   Continuous Glucose Sensor (DEXCOM G7 SENSOR) MISC 6 each 3    Sig: Use to monitor glucose continuously change every 10 days    Authorizing Provider: Carlus Pavlov    Ordering User: Pollie Meyer

## 2022-11-12 NOTE — Addendum Note (Signed)
Addended by: Pollie Meyer on: 11/12/2022 02:25 PM   Modules accepted: Orders

## 2022-11-12 NOTE — Addendum Note (Signed)
Addended by: Pollie Meyer on: 11/12/2022 10:00 AM   Modules accepted: Orders

## 2022-11-12 NOTE — Telephone Encounter (Signed)
Patient called back she is wearing her last POD and advises that pharmacy told her they need a  PA - return call [phone]

## 2022-11-13 ENCOUNTER — Telehealth: Payer: Self-pay | Admitting: Internal Medicine

## 2022-11-13 ENCOUNTER — Ambulatory Visit
Admission: RE | Admit: 2022-11-13 | Discharge: 2022-11-13 | Disposition: A | Discharge status: Home / Self Care | Payer: Medicaid Other | Source: Ambulatory Visit | Attending: Family Medicine | Admitting: Family Medicine

## 2022-11-13 ENCOUNTER — Encounter: Payer: Self-pay | Admitting: Family Medicine

## 2022-11-13 ENCOUNTER — Telehealth: Payer: Self-pay

## 2022-11-13 ENCOUNTER — Ambulatory Visit (INDEPENDENT_AMBULATORY_CARE_PROVIDER_SITE_OTHER): Payer: Medicaid Other | Admitting: Family Medicine

## 2022-11-13 ENCOUNTER — Encounter: Payer: Self-pay | Admitting: *Deleted

## 2022-11-13 VITALS — BP 90/70 | HR 76 | Temp 98.9°F | Ht 62.25 in | Wt 161.1 lb

## 2022-11-13 DIAGNOSIS — M5136 Other intervertebral disc degeneration, lumbar region with discogenic back pain only: Secondary | ICD-10-CM | POA: Diagnosis not present

## 2022-11-13 DIAGNOSIS — R399 Unspecified symptoms and signs involving the genitourinary system: Secondary | ICD-10-CM

## 2022-11-13 DIAGNOSIS — M545 Low back pain, unspecified: Secondary | ICD-10-CM | POA: Diagnosis not present

## 2022-11-13 DIAGNOSIS — E1142 Type 2 diabetes mellitus with diabetic polyneuropathy: Secondary | ICD-10-CM

## 2022-11-13 DIAGNOSIS — N390 Urinary tract infection, site not specified: Secondary | ICD-10-CM | POA: Diagnosis not present

## 2022-11-13 DIAGNOSIS — M5126 Other intervertebral disc displacement, lumbar region: Secondary | ICD-10-CM | POA: Diagnosis not present

## 2022-11-13 DIAGNOSIS — Z041 Encounter for examination and observation following transport accident: Secondary | ICD-10-CM | POA: Diagnosis not present

## 2022-11-13 DIAGNOSIS — M48061 Spinal stenosis, lumbar region without neurogenic claudication: Secondary | ICD-10-CM | POA: Diagnosis not present

## 2022-11-13 LAB — POCT URINE DIPSTICK
Bilirubin, UA: NEGATIVE
Glucose, UA: >=1000 mg/dL — AB
Ketones, POC UA: NEGATIVE mg/dL
Nitrite, UA: NEGATIVE
POC PROTEIN,UA: NEGATIVE
Spec Grav, UA: 1.02 (ref 1.010–1.025)
Urobilinogen, UA: 0.2 U/dL
pH, UA: 5 (ref 5.0–8.0)

## 2022-11-13 MED ORDER — DEXCOM G7 SENSOR MISC: 3 refills | Status: DC

## 2022-11-13 MED ORDER — OMNIPOD 5 G7 PODS (GEN 5) MISC: 1.0000 | 3 refills | Status: DC

## 2022-11-13 MED ORDER — OMNIPOD 5 DEXG7G6 INTRO GEN 5 KIT: 1.0000 | PACK | 0 refills | Status: DC | PRN

## 2022-11-13 MED ORDER — CELECOXIB 200 MG PO CAPS: 200.0000 mg | ORAL_CAPSULE | Freq: Every day | ORAL | 2 refills | Status: DC

## 2022-11-13 MED ORDER — SULFAMETHOXAZOLE-TRIMETHOPRIM 800-160 MG PO TABS: 1.0000 | ORAL_TABLET | Freq: Two times a day (BID) | ORAL | 0 refills | Status: DC

## 2022-11-13 NOTE — Telephone Encounter (Signed)
Patient called back today stating that she spoke with the Pharmacy today and they advised that she needs to get this through the mail. Rx has been sent through [facility].   Requested Prescriptions   Signed Prescriptions Disp Refills   Insulin Disposable Pump (OMNIPOD 5 DEXG7G6 INTRO GEN 5) KIT 1 kit 0    Sig: 1 each by Does not apply route as needed.    Authorizing Provider: Carlus Pavlov    Ordering User: Coby Shrewsberry S   Insulin Disposable Pump (OMNIPOD 5 G7 PODS, GEN 5,) MISC 45 each 3    Sig: 1 each by Does not apply route every other day.    Authorizing Provider: Carlus Pavlov    Ordering User: Zyasia Halbleib S   Continuous Glucose Sensor (DEXCOM G7 SENSOR) MISC 6 each 3    Sig: Use to monitor glucose continuously change every 10 days    Authorizing Provider: Carlus Pavlov    Ordering User: Pollie Meyer

## 2022-11-13 NOTE — Telephone Encounter (Signed)
Pt come in today for X ray for her back wanted to know if you did any testing for a UTI on her urine from 11/11/22. She saw your comment on the lab work but stated hse is having some burning sensation when she urinates. Please advise.   Thanks

## 2022-11-13 NOTE — Telephone Encounter (Signed)
Got Rx from Dr Patsy Lager today

## 2022-11-13 NOTE — Telephone Encounter (Signed)
When she was seen 11-11-22, she thought it may have been yeast related. Will see if Dr Alphonsus Sias wants to see her or just run the urine.

## 2022-11-16 LAB — URINE CULTURE
MICRO NUMBER:: 15664049
SPECIMEN QUALITY:: ADEQUATE

## 2022-11-21 ENCOUNTER — Other Ambulatory Visit: Payer: Self-pay

## 2022-11-21 ENCOUNTER — Telehealth: Payer: Self-pay

## 2022-11-21 DIAGNOSIS — E1165 Type 2 diabetes mellitus with hyperglycemia: Secondary | ICD-10-CM

## 2022-11-21 MED ORDER — OMNIPOD 5 G7 PODS (GEN 5) MISC: 1.0000 | 3 refills | Status: DC

## 2022-11-21 MED ORDER — OMNIPOD 5 DEXG7G6 INTRO GEN 5 KIT: PACK | 0 refills | Status: DC

## 2022-11-21 MED ORDER — DEXCOM G7 SENSOR MISC: 3 refills | Status: DC

## 2022-11-21 NOTE — Telephone Encounter (Signed)
Anita Massey, Are you able to help with this?  We gave her all the pods that I could find. I am also including Anita Massey as my understanding was that the OmniPod that communicates with the G7 CGM was approved for her and I am not sure why she cannot get it... Lyla Son, thank you for sending the supplies to [facility]. [name] is completely out of pods she can continue to use the mealtime insulin but she also needs long-acting insulin. Sincerely, Carlus Pavlov MD

## 2022-11-21 NOTE — Telephone Encounter (Signed)
Patient has been advised to use short acting insulin and long acting until she is able to get pump supplies. She was advise to start at 20-25 units bid on Lantus and work her way up to the 30-40 if needed .

## 2022-11-21 NOTE — Telephone Encounter (Signed)
Patient is out of pods  for her pump and would like to know what she needs to do. I have sent the order to [facility] as [place] was not contracted with patient insurance.

## 2022-11-25 NOTE — Telephone Encounter (Signed)
I called the OmniPod rep last week about her.  There was a message on my phone this this is a Medicaid problem their coding issue for OmniPod5 and with Dexcom G7.  It was to be fixed by Friday.  I called the patient and she said she was able to pick up her supplies late Friday from [facility]

## 2022-12-16 ENCOUNTER — Ambulatory Visit: Payer: Medicaid Other | Admitting: Internal Medicine

## 2022-12-16 DIAGNOSIS — J019 Acute sinusitis, unspecified: Secondary | ICD-10-CM | POA: Diagnosis not present

## 2022-12-16 DIAGNOSIS — Z03818 Encounter for observation for suspected exposure to other biological agents ruled out: Secondary | ICD-10-CM | POA: Diagnosis not present

## 2022-12-16 DIAGNOSIS — B9689 Other specified bacterial agents as the cause of diseases classified elsewhere: Secondary | ICD-10-CM | POA: Diagnosis not present

## 2022-12-16 NOTE — Progress Notes (Unsigned)
Patient ID: Anita Massey, female   DOB: 1980-10-06, 42 y.o.   MRN: [id number]  HPI: Anita Massey is a 42 y.o.-year-old female, self-referred, for management of DM2, dx in 2020, insulin-dependent since 2022, uncontrolled, with long term complications (PN).  Her mother Anita Massey) is also my patient.  Last visit 1 month ago. She previously saw Dr. Roanna Raider. She has Medicaid.  Interim history: She has increased urination, blurry vision, fatigue.  No nausea, chest pain.   Insulin pump: - Omnipod 5 - started 10/09/2022 then came off after running out of pods as she was not given a refill from the pharmacy - iLet pump was not covered by her insurance - now back on OmniPod  CGM: - Dexcom G6  Insulin: - Humalog U200 initially in the pump - now Humalog U100 starting 10/10/2022  Supplies: - [facility] - [address] [place]  Reviewed HbA1c: Lab Results  Component Value Date   HGBA1C 14.2 (A) 09/24/2022   HGBA1C 11.1 (A) 05/29/2022   HGBA1C 9.7 (A) 02/28/2022   HGBA1C 9.5 (A) 12/28/2021   HGBA1C 9.9 (A) 10/19/2021   HGBA1C 8.9 (A) 05/11/2021   HGBA1C 9.7 (A) 02/16/2021   HGBA1C 9.3 (A) 08/28/2020   HGBA1C 9.9 (H) 03/28/2020   HGBA1C 5.9 12/26/2017  11/23/2021 (records from [place] accessed by pt. During the appt.): HbA1c calculated from fructosamine: 8.1%  At last visit she was on: - Humalog 7 to 14  >> 26 units before every meal (2x a day) - Trulicity 0.75 mg weekly >> Ozempic 0.25 mg weekly - Lantus 15 >>... 40 units  in am (for the last week) or at bedtime >> 30 units at bedtime She was not able to tolerate metformin due to nausea but also tingling. She was not able to tolerate higher doses of Trulicity due to stool incontinence. She was previously on Farxiga 10 mg before breakfast >> stopped in 2024 b/c not covered  We changed to: OmniPod 5 insulin pump with Dexcom CGM: - Basal rates: 12 am: 1.1 units/h - Insulin to carb ratio: 12 am: 1:1 - Target: 12  am: 120-120 - Correction factor (insulin sensitivity factor):  12 am:  50 - Active insulin time: 4h - Changes infusion site: q2-3 days  Total daily dose from basal insulin: 66% Total daily dose from bolus insulin: 34% TDD: 53-70 units per day  She checks sugars more than 4 times a day with her CGM:  Previously:    Previously:  Lowest sugar was 60s >> 50s >>; hypoglycemia awareness at 100. Highest sugar was HI >> HI.  Glucometer: Accu-Chek guide  Pt's meals are: - Breakfast: oatmeal, cereal, eggs - Lunch: sandwich, pizza - Dinner: chicken or other meat, veggies, salad, baked potatoes - Snacks: 2 Stopped sweet tea and sodas  -only has these rarely now.  - no CKD, last BUN/creatinine:  Lab Results  Component Value Date   BUN 14 11/11/2022   BUN 15 08/28/2020   CREATININE 0.68 11/11/2022   CREATININE 0.68 08/28/2020   Lab Results  Component Value Date   MICRALBCREAT 2.3 11/11/2022   MICRALBCREAT 3.2 09/24/2022   MICRALBCREAT 11.0 03/28/2020  She is not on ACE inhibitor/ARB.  -+ HL; last set of lipids: Lab Results  Component Value Date   CHOL 140 11/11/2022   HDL 36.80 (L) 11/11/2022   LDLCALC 82 11/11/2022   TRIG 107.0 11/11/2022   CHOLHDL 4 11/11/2022  In 04/2022, we started Lipitor 40 mg daily.  - last eye  exam was 2024 >> No DR reportedly.Elevated IO pressure. On eye drops. Dr. Sherryll Burger.  - + numbness and tingling in her feet.  Last foot exam was on 11/11/2022.  Pt has FH of DM in mother (who is also my patient), and maternal side of the family.  She has a history of yeast infections for which she intermittently takes  Diflucan. She also has history of HTN, microscopic hematuria, elevated liver enzymes, GERD, obesity (in the past, she was weighing over 200 pounds), anemia, rosacea, homozygosity of MTHFR variant, anxiety.  ROS: + See HPI  Past Medical History:  Diagnosis Date   Anemia    Anxiety state, unspecified    Blood dyscrasia    PROTHROMBIN G  20210 HETEROZYGOSITY (FACTOR 2)   Clotting disorder (HCC)    Diabetes mellitus without complication (HCC)    GERD (gastroesophageal reflux disease)    OCC   Homozygous for MTHFR gene mutation    Hypertension    Hypothyroidism    Multiple joint pain 09/04/2015   Obesity    Rosacea    Undiagnosed cardiac murmurs    Unspecified essential hypertension    Past Surgical History:  Procedure Laterality Date   APPENDECTOMY  2009   [facility]   CESAREAN SECTION  2016   CESAREAN SECTION N/A 11/15/2018   Procedure: REPEAT CESAREAN SECTION;  Surgeon: Natale Milch, MD;  Location: [facility] ORS;  Service: Obstetrics;  Laterality: N/A;  Female born @ 14 Apgars: 8/9 Weight: 6lbs 10 ozs   CHOLECYSTECTOMY N/A 03/20/2016   Procedure: LAPAROSCOPIC CHOLECYSTECTOMY;  Surgeon: Leafy Ro, MD;  Location: [facility] ORS;  Service: General;  Laterality: N/A;   DILATION AND CURETTAGE OF UTERUS     Social History   Socioeconomic History   Marital status: Married    Spouse name: Cristal Deer   Number of children: 3   Years of education: Not on file   Highest education level: Not on file  Occupational History   Occupation: Homemaker  Tobacco Use   Smoking status: Former    Current packs/day: 0.00    Average packs/day: 0.3 packs/day for 1 year (0.3 ttl pk-yrs)    Types: Cigarettes    Start date: 03/13/1997    Quit date: 03/13/1998    Years since quitting: 24.7   Smokeless tobacco: Never  Vaping Use   Vaping status: Never Used  Substance and Sexual Activity   Alcohol use: No   Drug use: No   Sexual activity: Not Currently    Birth control/protection: I.U.D.    Comment: Mirena   Other Topics Concern   Not on file  Social History Narrative          Social Determinants of Health   Financial Resource Strain: Low Risk  (11/13/2018)   Overall Financial Resource Strain (CARDIA)    Difficulty of Paying Living Expenses: Not hard at all  Food Insecurity: No Food Insecurity (11/13/2018)   Hunger Vital Sign     Worried About Running Out of Food in the Last Year: Never true    Ran Out of Food in the Last Year: Never true  Transportation Needs: No Transportation Needs (11/13/2018)   PRAPARE - Administrator, Civil Service (Medical): No    Lack of Transportation (Non-Medical): No  Physical Activity: Inactive (11/13/2018)   Exercise Vital Sign    Days of Exercise per Week: 0 days    Minutes of Exercise per Session: 0 min  Stress: Stress Concern Present (11/13/2018)   [place]  [employer] - Occupational Stress Questionnaire    Feeling of Stress : Very much  Social Connections: Unknown (11/13/2018)   Social Connection and Isolation Panel [NHANES]    Frequency of Communication with Friends and Family: More than three times a week    Frequency of Social Gatherings with Friends and Family: More than three times a week    Attends Religious Services: Patient declined    Database administrator or Organizations: Patient declined    Attends Banker Meetings: Patient declined    Marital Status: Married  Catering manager Violence: Not At Risk (11/13/2018)   Humiliation, Afraid, Rape, and Kick questionnaire    Fear of Current or Ex-Partner: No    Emotionally Abused: No    Physically Abused: No    Sexually Abused: No   Current Outpatient Medications on File Prior to Visit  Medication Sig Dispense Refill   Accu-Chek Softclix Lancets lancets Use to check blood sugar 3-4 times daily 100 each 12   ALPRAZolam (XANAX) 0.25 MG tablet Take 1 tablet (0.25 mg total) by mouth 2 (two) times daily as needed for anxiety. 30 tablet 0   amLODipine (NORVASC) 10 MG tablet Take 1 tablet (10 mg total) by mouth daily. 90 tablet 3   atorvastatin (LIPITOR) 40 MG tablet Take 1 tablet (40 mg total) by mouth daily. 90 tablet 3   celecoxib (CELEBREX) 200 MG capsule Take 1 capsule (200 mg total) by mouth daily. 30 capsule 2   Continuous Glucose Sensor (DEXCOM G7 SENSOR) MISC Use to  monitor glucose continuously change every 10 days 6 each 3   Continuous Glucose Transmitter (DEXCOM G6 TRANSMITTER) MISC 1 Device by Does not apply route every 3 (three) months. 1 each 3   dorzolamide-timolol (COSOPT) 2-0.5 % ophthalmic solution 1 drop 2 (two) times daily.     fluconazole (DIFLUCAN) 150 MG tablet Take 1 tablet (150 mg total) by mouth once a week. As needed 4 tablet 3   fluticasone (FLONASE) 50 MCG/ACT nasal spray Place 2 sprays into both nostrils daily. 16 g 0   glucose blood (ACCU-CHEK GUIDE) test strip Use to check blood sugar 3-4 times daily 100 each 12   Insulin Disposable Pump (OMNIPOD 5 DEXG7G6 INTRO GEN 5) KIT 1 each by Does not apply route as needed. 1 kit 0   Insulin Disposable Pump (OMNIPOD 5 DEXG7G6 INTRO GEN 5) KIT Change pod every other day 1 kit 0   Insulin Disposable Pump (OMNIPOD 5 G7 PODS, GEN 5,) MISC 1 each by Does not apply route every other day. 45 each 3   insulin glargine (LANTUS) 100 UNIT/ML injection Inject 0.3-0.4 mLs (30-40 Units total) into the skin 2 (two) times daily. 15 mL 11   insulin lispro (HUMALOG) 100 UNIT/ML injection Use up to 100 units in the insulin pump daily 30 mL 11   Insulin Pen Needle 32G X 4 MM MISC Use 4x a day 300 each 3   ketoconazole (NIZORAL) 2 % cream Apply 1 application topically in the morning and at bedtime. 60 g 5   nystatin (MYCOSTATIN/NYSTOP) powder Apply 1 Application topically 3 (three) times daily. 15 g 0   ondansetron (ZOFRAN ODT) 4 MG disintegrating tablet Take 1 tablet (4 mg total) by mouth every 8 (eight) hours as needed for nausea or vomiting. 20 tablet 0   Semaglutide,0.25 or 0.5MG /DOS, 2 MG/3ML SOPN Inject 0.5 mg into the skin once a week. 9 mL 3   sertraline (ZOLOFT) 100 MG  tablet Take 2 tablets (200 mg total) by mouth daily. 180 tablet 3   sulfamethoxazole-trimethoprim (BACTRIM DS) 800-160 MG tablet Take 1 tablet by mouth 2 (two) times daily. 14 tablet 0   terconazole (TERAZOL 7) 0.4 % vaginal cream Place 1  applicator vaginally at bedtime. 45 g 0   tizanidine (ZANAFLEX) 2 MG capsule Take 1 capsule (2 mg total) by mouth 3 (three) times daily as needed for muscle spasms. 20 capsule 0   valACYclovir (VALTREX) 1000 MG tablet Take 2 tablets (2,000 mg total) by mouth daily. And repeat once in 12 hours---for cold sore 20 tablet 0   No current facility-administered medications on file prior to visit.   Allergies  Allergen Reactions   Biaxin [Clarithromycin] Hives   Metformin And Related Nausea And Vomiting    Also bad tingling   Toradol [Ketorolac Tromethamine]     Chest pain    Prednisone Anxiety and Palpitations    "Makes me feel crazy"   Morphine And Codeine     Chest Pain    Trulicity [Dulaglutide] Diarrhea    Bowel incontinence on high dose, tolerating 0.75 mg ok per patient   Tape Rash    Paper tape is fine   Family History  Problem Relation Age of Onset   Mitral valve prolapse Mother    Arrhythmia Mother    Hypertension Mother    Diabetes Mother    Hypertension Father    Hyperlipidemia Father    Heart disease Maternal Grandfather    Lung cancer Maternal Grandfather    Cancer Maternal Grandfather    Diabetes Brother    Heart disease Maternal Aunt    Diabetes Maternal Aunt    Depression Maternal Aunt    Cancer Maternal Grandmother        ovarian melanoma   Diabetes Maternal Grandmother    Diabetes Maternal Uncle    PE: There were no vitals taken for this visit. Wt Readings from Last 3 Encounters:  11/13/22 161 lb 2 oz (73.1 kg)  11/11/22 159 lb (72.1 kg)  11/06/22 159 lb 3.2 oz (72.2 kg)   Constitutional: normal weight, in NAD Eyes: EOMI, no exophthalmos ENT: no thyromegaly, no cervical lymphadenopathy Cardiovascular: RRR, No MRG Respiratory: CTA B Musculoskeletal: no deformities Skin: no rashes Neurological: + Mild tremor with outstretched hands  ASSESSMENT: 1. DM2, insulin-dependent, uncontrolled, with complications -Peripheral neuropathy  2. HL  PLAN:  1.  Patient with very uncontrolled diabetes, previously on basal bolus insulin regimen and GLP-1 receptor agonist, with extremely poor control.  Blood sugars were staying in the 300-500 range and HbA1c returned at 14.2%.  At that point I suggested an insulin pump.  She was able to start OmniPod 5 integrated with the Dexcom CGM.  Blood sugars improved to the point of lows so we had to back off from Humalog U200 to Humalog U100.  Afterwards, sugars increased again as she was not bolusing almost at all.  She had appointment with the diabetes educator and she was again advised to bolus before every meal.  At last visit, unfortunately, she was off the pump due to lack of supplies as she was only on Humalog without long-acting insulin (!)  So sugars are higher.  Fortunately, she was able to start back on the pump. -We could not check her C-peptide so far as her blood sugars are very high CGM interpretation: -At today's visit, we reviewed her CGM downloads: It appears that 11% of values are in target range (goal >70%),  while 89% are higher than 180 (goal <25%), and 0% are lower than 70 (goal <4%).  The calculated average blood sugar is 275.  The projected HbA1c for the next 3 months (GMI) is 9.9%. -Reviewing the CGM trends, sugars again appear to be very high, fluctuating almost entirely above the target range, around the 250 mg/mL: Overnight and increasing in a stepwise fashion throughout the day, with every meal.  - I suggested to:  Patient Instructions  Please continue: - Ozempic 0.5 mg weekly  Please use the following pump settings: - Basal rates: 12 am:  1.1 units/h - Insulin to carb ratio: 12 am:  1:1 - Target: 12 am: 120-120 - Correction factor (insulin sensitivity factor):  12 am: 50  - Active insulin time: 4h - Changes infusion site: q3 days  Please return in 1.5 months.  - we checked her HbA1c: 7%  - advised to check sugars at different times of the day - 4x a day, rotating check times -  advised for yearly eye exams >> she is UTD - return to clinic in 1.5 months  2.HL -R reviewed latest lipid panel from 10/2022: LDL slightly higher than target of less than 70, HDL low, TG at goal: Lab Results  Component Value Date   CHOL 140 11/11/2022   HDL 36.80 (L) 11/11/2022   LDLCALC 82 11/11/2022   TRIG 107.0 11/11/2022   CHOLHDL 4 11/11/2022  -Before the above labs, I recommend Lipitor 40 mg daily.  She tolerates this well.  Carlus Pavlov, MD PhD [facility]

## 2022-12-18 ENCOUNTER — Encounter: Payer: Self-pay | Admitting: Internal Medicine

## 2022-12-19 ENCOUNTER — Encounter: Payer: Self-pay | Admitting: Internal Medicine
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
## 2022-12-25 ENCOUNTER — Ambulatory Visit: Payer: Medicaid Other | Admitting: Family Medicine

## 2022-12-25 DIAGNOSIS — M545 Low back pain, unspecified: Secondary | ICD-10-CM

## 2022-12-26 ENCOUNTER — Encounter: Payer: Self-pay | Admitting: Internal Medicine

## 2023-01-01 ENCOUNTER — Encounter: Payer: Self-pay | Admitting: Internal Medicine

## 2023-01-01 ENCOUNTER — Ambulatory Visit (INDEPENDENT_AMBULATORY_CARE_PROVIDER_SITE_OTHER): Payer: Medicaid Other | Admitting: Internal Medicine

## 2023-01-01 ENCOUNTER — Telehealth: Payer: Self-pay

## 2023-01-01 VITALS — BP 120/70 | HR 90 | Ht 62.25 in | Wt 158.8 lb

## 2023-01-01 DIAGNOSIS — E785 Hyperlipidemia, unspecified: Secondary | ICD-10-CM | POA: Diagnosis not present

## 2023-01-01 DIAGNOSIS — Z794 Long term (current) use of insulin: Secondary | ICD-10-CM

## 2023-01-01 DIAGNOSIS — Z7985 Long-term (current) use of injectable non-insulin antidiabetic drugs: Secondary | ICD-10-CM

## 2023-01-01 DIAGNOSIS — E1165 Type 2 diabetes mellitus with hyperglycemia: Secondary | ICD-10-CM

## 2023-01-01 DIAGNOSIS — E1142 Type 2 diabetes mellitus with diabetic polyneuropathy: Secondary | ICD-10-CM

## 2023-01-01 LAB — POCT GLYCOSYLATED HEMOGLOBIN (HGB A1C): Hemoglobin A1C: 11.1 % — AB (ref 4.0–5.6)

## 2023-01-01 MED ORDER — OZEMPIC (1 MG/DOSE) 4 MG/3ML ~~LOC~~ SOPN: 1.0000 mg | PEN_INJECTOR | SUBCUTANEOUS | 3 refills | Status: DC

## 2023-01-01 MED ORDER — DEXCOM G6 TRANSMITTER MISC: 1.0000 | 3 refills | Status: DC

## 2023-01-01 NOTE — Patient Instructions (Addendum)
Please try to increase: - Ozempic 1 mg weekly  Please use the following pump settings: - Basal rates: 12 am:  1.1 >> 1.4 units/h - Insulin to carb ratio: 12 am:  1:1 (enter 40-50% more carbs and bolus 15 min before meals, and also do frequent corrections for sugars >180) - Target: 12 am: 120-120 - Correction factor (insulin sensitivity factor):  12 am: 50  - Active insulin time: 4h - Changes infusion site: q3 days  Please return in 1.5 months.

## 2023-01-01 NOTE — Progress Notes (Signed)
Patient ID: [name], female   DOB: December 02, 1980, 42 y.o.   MRN: [id number] This note was precharted 12/16/2022.  HPI: Anita Massey is a 42 y.o.-year-old female, self-referred, for management of DM2, dx in 2020, insulin-dependent since 2022, uncontrolled, with long term complications (PN).  Her mother Anita Massey) is also my patient.  Last visit 1.5 month ago. She previously saw Dr. Roanna Raider. She has Medicaid.  Interim history: She has increased urination, no blurry vision, and fatigue.  No nausea or chest pain. At today's visit, she has a URI.  All her 3 kids have been sick.  She was started on antibiotics (Levaquin), but not steroids. She recently lost her CGM transmitter.  We gave her 1 today.  Insulin pump: - Omnipod 5 - started 10/09/2022 then came off after running out of pods as she was not given a refill from the pharmacy - iLet pump was not covered by her insurance - now back on OmniPod 5  CGM: - Dexcom G6  Insulin: - Humalog U200 initially in the pump - now Humalog U100 starting 10/10/2022  Supplies: - [facility] - [address] [place]  Reviewed HbA1c: Lab Results  Component Value Date   HGBA1C 14.2 (A) 09/24/2022   HGBA1C 11.1 (A) 05/29/2022   HGBA1C 9.7 (A) 02/28/2022   HGBA1C 9.5 (A) 12/28/2021   HGBA1C 9.9 (A) 10/19/2021   HGBA1C 8.9 (A) 05/11/2021   HGBA1C 9.7 (A) 02/16/2021   HGBA1C 9.3 (A) 08/28/2020   HGBA1C 9.9 (H) 03/28/2020   HGBA1C 5.9 12/26/2017  11/23/2021 (records from [facility] accessed by pt. During the appt.): HbA1c calculated from fructosamine: 8.1%  At last visit she was on: - Humalog 7 to 14  >> 26 units before every meal (2x a day) - Trulicity 0.75 mg weekly >> Ozempic 0.25 mg weekly - Lantus 15 >>... 40 units  in am (for the last week) or at bedtime >> 30 units at bedtime She was not able to tolerate metformin due to nausea but also tingling. She was not able to tolerate higher doses of Trulicity due to stool  incontinence. She was previously on Farxiga 10 mg before breakfast >> stopped in 2024 b/c not covered  We changed to: OmniPod 5 insulin pump with Dexcom CGM: - Basal rates: 12 am: 1.1 units/h - Insulin to carb ratio: 12 am: 1:1 - Target: 12 am: 120-120 - Correction factor (insulin sensitivity factor):  12 am:  50 - Active insulin time: 4h - Changes infusion site: q2-3 days  Total daily dose from basal insulin: 66% >> 69% Total daily dose from bolus insulin: 34% >> 31% TDD: 36-70 units per day  She is also on Ozempic 0.5 mg weekly.  She checks sugars more than 4 times a day with her CGM:   Previously:  Previously:   Previously:  Lowest sugar was 60s >> 50s >> 100; hypoglycemia awareness at 100. Highest sugar was HI >> HI >> HI  Glucometer: Accu-Chek guide  Pt's meals are: - Breakfast: oatmeal, cereal, eggs - Lunch: sandwich, pizza - Dinner: chicken or other meat, veggies, salad, baked potatoes - Snacks: 2 Stopped sweet tea and sodas  -only has these rarely now.  - no CKD, last BUN/creatinine:  Lab Results  Component Value Date   BUN 14 11/11/2022   BUN 15 08/28/2020   CREATININE 0.68 11/11/2022   CREATININE 0.68 08/28/2020   Lab Results  Component Value Date   MICRALBCREAT 2.3 11/11/2022   MICRALBCREAT 3.2 09/24/2022  MICRALBCREAT 11.0 03/28/2020  She is not on ACE inhibitor/ARB.  -+ HL; last set of lipids: Lab Results  Component Value Date   CHOL 140 11/11/2022   HDL 36.80 (L) 11/11/2022   LDLCALC 82 11/11/2022   TRIG 107.0 11/11/2022   CHOLHDL 4 11/11/2022  In 04/2022, we started Lipitor 40 mg daily.  - last eye exam was 2024 >> No DR reportedly.Elevated IO pressure. On eye drops. Dr. Sherryll Burger.  - + numbness and tingling in her feet.  Last foot exam was on 11/11/2022.  Pt has FH of DM in mother (who is also my patient), and maternal side of the family.  She has a history of yeast infections for which she intermittently takes  Diflucan. She  also has history of HTN, microscopic hematuria, elevated liver enzymes, GERD, obesity (in the past, she was weighing over 200 pounds), anemia, rosacea, homozygosity of MTHFR variant, anxiety.  ROS: + See HPI  Past Medical History:  Diagnosis Date   Anemia    Anxiety state, unspecified    Blood dyscrasia    PROTHROMBIN G 19147 HETEROZYGOSITY (FACTOR 2)   Clotting disorder (HCC)    Diabetes mellitus without complication (HCC)    GERD (gastroesophageal reflux disease)    OCC   Homozygous for MTHFR gene mutation    Hypertension    Hypothyroidism    Multiple joint pain 09/04/2015   Obesity    Rosacea    Undiagnosed cardiac murmurs    Unspecified essential hypertension    Past Surgical History:  Procedure Laterality Date   APPENDECTOMY  2009   [facility]   CESAREAN SECTION  2016   CESAREAN SECTION N/A 11/15/2018   Procedure: REPEAT CESAREAN SECTION;  Surgeon: Natale Milch, MD;  Location: [facility] ORS;  Service: Obstetrics;  Laterality: N/A;  Female born @ [birth year] Apgars: 8/9 Weight: 6lbs 10 ozs   CHOLECYSTECTOMY N/A 03/20/2016   Procedure: LAPAROSCOPIC CHOLECYSTECTOMY;  Surgeon: Leafy Ro, MD;  Location: [facility] ORS;  Service: General;  Laterality: N/A;   DILATION AND CURETTAGE OF UTERUS     Social History   Socioeconomic History   Marital status: Married    Spouse name: Cristal Deer   Number of children: 3   Years of education: Not on file   Highest education level: Not on file  Occupational History   Occupation: Homemaker  Tobacco Use   Smoking status: Former    Current packs/day: 0.00    Average packs/day: 0.3 packs/day for 1 year (0.3 ttl pk-yrs)    Types: Cigarettes    Start date: 03/13/1997    Quit date: 03/13/1998    Years since quitting: 24.8   Smokeless tobacco: Never  Vaping Use   Vaping status: Never Used  Substance and Sexual Activity   Alcohol use: No   Drug use: No   Sexual activity: Not Currently    Birth control/protection: I.U.D.    Comment: Mirena    Other Topics Concern   Not on file  Social History Narrative          Social Drivers of Health   Financial Resource Strain: Low Risk  (11/13/2018)   Overall Financial Resource Strain (CARDIA)    Difficulty of Paying Living Expenses: Not hard at all  Food Insecurity: No Food Insecurity (11/13/2018)   Hunger Vital Sign    Worried About Running Out of Food in the Last Year: Never true    Ran Out of Food in the Last Year: Never true  Transportation Needs: No Transportation  Needs (11/13/2018)   PRAPARE - Administrator, Civil Service (Medical): No    Lack of Transportation (Non-Medical): No  Physical Activity: Inactive (11/13/2018)   Exercise Vital Sign    Days of Exercise per Week: 0 days    Minutes of Exercise per Session: 0 min  Stress: Stress Concern Present (11/13/2018)   [employer] of Occupational Health - Occupational Stress Questionnaire    Feeling of Stress : Very much  Social Connections: Unknown (11/13/2018)   Social Connection and Isolation Panel [NHANES]    Frequency of Communication with Friends and Family: More than three times a week    Frequency of Social Gatherings with Friends and Family: More than three times a week    Attends Religious Services: Patient declined    Database administrator or Organizations: Patient declined    Attends Banker Meetings: Patient declined    Marital Status: Married  Catering manager Violence: Not At Risk (11/13/2018)   Humiliation, Afraid, Rape, and Kick questionnaire    Fear of Current or Ex-Partner: No    Emotionally Abused: No    Physically Abused: No    Sexually Abused: No   Current Outpatient Medications on File Prior to Visit  Medication Sig Dispense Refill   Accu-Chek Softclix Lancets lancets Use to check blood sugar 3-4 times daily 100 each 12   ALPRAZolam (XANAX) 0.25 MG tablet Take 1 tablet (0.25 mg total) by mouth 2 (two) times daily as needed for anxiety. 30 tablet 0   amLODipine  (NORVASC) 10 MG tablet Take 1 tablet (10 mg total) by mouth daily. 90 tablet 3   atorvastatin (LIPITOR) 40 MG tablet Take 1 tablet (40 mg total) by mouth daily. 90 tablet 3   celecoxib (CELEBREX) 200 MG capsule Take 1 capsule (200 mg total) by mouth daily. 30 capsule 2   Continuous Glucose Sensor (DEXCOM G7 SENSOR) MISC Use to monitor glucose continuously change every 10 days 6 each 3   Continuous Glucose Transmitter (DEXCOM G6 TRANSMITTER) MISC 1 Device by Does not apply route every 3 (three) months. 1 each 3   dorzolamide-timolol (COSOPT) 2-0.5 % ophthalmic solution 1 drop 2 (two) times daily.     fluconazole (DIFLUCAN) 150 MG tablet Take 1 tablet (150 mg total) by mouth once a week. As needed 4 tablet 3   fluticasone (FLONASE) 50 MCG/ACT nasal spray Place 2 sprays into both nostrils daily. 16 g 0   glucose blood (ACCU-CHEK GUIDE) test strip Use to check blood sugar 3-4 times daily 100 each 12   Insulin Disposable Pump (OMNIPOD 5 DEXG7G6 INTRO GEN 5) KIT 1 each by Does not apply route as needed. 1 kit 0   Insulin Disposable Pump (OMNIPOD 5 DEXG7G6 INTRO GEN 5) KIT Change pod every other day 1 kit 0   Insulin Disposable Pump (OMNIPOD 5 G7 PODS, GEN 5,) MISC 1 each by Does not apply route every other day. 45 each 3   insulin glargine (LANTUS) 100 UNIT/ML injection Inject 0.3-0.4 mLs (30-40 Units total) into the skin 2 (two) times daily. 15 mL 11   insulin lispro (HUMALOG) 100 UNIT/ML injection Use up to 100 units in the insulin pump daily 30 mL 11   Insulin Pen Needle 32G X 4 MM MISC Use 4x a day 300 each 3   ketoconazole (NIZORAL) 2 % cream Apply 1 application topically in the morning and at bedtime. 60 g 5   nystatin (MYCOSTATIN/NYSTOP) powder Apply 1 Application topically 3 (  three) times daily. 15 g 0   ondansetron (ZOFRAN ODT) 4 MG disintegrating tablet Take 1 tablet (4 mg total) by mouth every 8 (eight) hours as needed for nausea or vomiting. 20 tablet 0   Semaglutide,0.25 or 0.5MG /DOS, 2  MG/3ML SOPN Inject 0.5 mg into the skin once a week. 9 mL 3   sertraline (ZOLOFT) 100 MG tablet Take 2 tablets (200 mg total) by mouth daily. 180 tablet 3   sulfamethoxazole-trimethoprim (BACTRIM DS) 800-160 MG tablet Take 1 tablet by mouth 2 (two) times daily. 14 tablet 0   terconazole (TERAZOL 7) 0.4 % vaginal cream Place 1 applicator vaginally at bedtime. 45 g 0   tizanidine (ZANAFLEX) 2 MG capsule Take 1 capsule (2 mg total) by mouth 3 (three) times daily as needed for muscle spasms. 20 capsule 0   valACYclovir (VALTREX) 1000 MG tablet Take 2 tablets (2,000 mg total) by mouth daily. And repeat once in 12 hours---for cold sore 20 tablet 0   No current facility-administered medications on file prior to visit.   Allergies  Allergen Reactions   Biaxin [Clarithromycin] Hives   Metformin And Related Nausea And Vomiting    Also bad tingling   Toradol [Ketorolac Tromethamine]     Chest pain    Prednisone Anxiety and Palpitations    "Makes me feel crazy"   Morphine And Codeine     Chest Pain    Trulicity [Dulaglutide] Diarrhea    Bowel incontinence on high dose, tolerating 0.75 mg ok per patient   Tape Rash    Paper tape is fine   Family History  Problem Relation Age of Onset   Mitral valve prolapse Mother    Arrhythmia Mother    Hypertension Mother    Diabetes Mother    Hypertension Father    Hyperlipidemia Father    Heart disease Maternal Grandfather    Lung cancer Maternal Grandfather    Cancer Maternal Grandfather    Diabetes Brother    Heart disease Maternal Aunt    Diabetes Maternal Aunt    Depression Maternal Aunt    Cancer Maternal Grandmother        ovarian melanoma   Diabetes Maternal Grandmother    Diabetes Maternal Uncle    PE: BP 120/70   Pulse 90   Ht 5' 2.25" (1.581 m)   Wt 158 lb 12.8 oz (72 kg)   SpO2 97%   BMI 28.81 kg/m  Wt Readings from Last 3 Encounters:  01/01/23 158 lb 12.8 oz (72 kg)  11/13/22 161 lb 2 oz (73.1 kg)  11/11/22 159 lb (72.1 kg)    Constitutional: normal weight, in NAD Eyes: EOMI, no exophthalmos ENT: no thyromegaly, no cervical lymphadenopathy Cardiovascular: RRR, No MRG Respiratory: CTA B Musculoskeletal: no deformities Skin: no rashes Neurological: + Mild tremor with outstretched hands  ASSESSMENT: 1. DM2, insulin-dependent, uncontrolled, with complications -Peripheral neuropathy  2. HL  PLAN:  1. Patient with very uncontrolled diabetes, previously on basal bolus insulin regimen and GLP-1 receptor agonist, with extremely poor control.  Blood sugars were staying in the 300-500 range and HbA1c returned at 14.2%.  At that point I suggested an insulin pump.  She was able to start OmniPod 5 integrated with the Dexcom CGM.  Blood sugars improved to the point of lows so we had to back off from Humalog U200 to Humalog U100.  Afterwards, sugars increased again as she was not bolusing almost at all.  She had appointment with the diabetes educator and she  was again advised to bolus before every meal.  At last visit, unfortunately, she was off the pump due to lack of supplies as she was only on Humalog without long-acting insulin (!)  So sugars are higher.  Fortunately, she was able to start back on the pump. -We could not check her C-peptide so far as her blood sugars were very high -She missed her appointment on 12/16/2022.  At that time, in preparation for the appointment, I did review her CGM tracings.  Sugars appears to be very high still, fluctuating almost entirely above the target range, above 250 overnight and increasing in a stepwise fashion throughout the day, with every meal. CGM interpretation: -At today's visit, we reviewed her CGM downloads: It appears that 2% of values are in target range (goal >70%), while 98% are higher than 180 (goal <25%), and 0% are lower than 70 (goal <4%).  The calculated average blood sugar is 336.  The projected HbA1c for the next 3 months (GMI) is 11.3%. -Reviewing the CGM trends,  sugars appear to be very high, worse in the last 2 weeks around Thanksgiving..  They are slightly decreasing overnight but increasing significantly after she starts eating during the day.  Upon reviewing individual daily traces, she does not reduce carbs into the pump but not enough and she sometimes only boluses once a day.  We discussed that she needs to do frequent corrections besides the boluses for meals.  For the meals, I advised her to enter 40 to 50% more carbs than currently doing.  We also increased her basal rates.  I also recommended to increase the dose of her Ozempic. -We did discuss that these chronic very high blood sugars are conducive to complications.  I am worried about her.  We absolutely need to decrease her blood sugars as soon as possible. -the iLet pump was not covered for her but we may retry this in the new year.  This would be easier for her to maneuver due to the fact that she just needs to announce per meals rather than enter carbs. - I suggested to:  Patient Instructions  Please try to increase: - Ozempic 1 mg weekly  Please use the following pump settings: - Basal rates: 12 am:  1.1 >> 1.4 units/h - Insulin to carb ratio: 12 am:  1:1 (enter 40-50% more carbs and bolus 15 min before meals, and also do frequent corrections for sugars >180) - Target: 12 am: 120-120 - Correction factor (insulin sensitivity factor):  12 am: 50  - Active insulin time: 4h - Changes infusion site: q3 days  Please return in 1.5 months.  - we checked her HbA1c: 11.1% (lower, but still very high) - advised to check sugars at different times of the day - 4x a day, rotating check times - advised for yearly eye exams >> she is UTD - return to clinic in 1.5 months  2.HL -Reviewed latest lipid panel from 10/2022: LDL slightly higher than target of less than 70, HDL low, triglycerides at goal: Lab Results  Component Value Date   CHOL 140 11/11/2022   HDL 36.80 (L) 11/11/2022   LDLCALC  82 11/11/2022   TRIG 107.0 11/11/2022   CHOLHDL 4 11/11/2022  - I previously recommended Lipitor 40 mg daily.  She tolerates this well.  She was on Lipitor when the labs were drawn.  Carlus Pavlov, MD PhD [facility]

## 2023-01-01 NOTE — Telephone Encounter (Signed)
Sent to [facility] Requested Prescriptions   Signed Prescriptions Disp Refills   Continuous Glucose Transmitter (DEXCOM G6 TRANSMITTER) MISC 1 each 3    Sig: 1 Device by Does not apply route every 3 (three) months.    Authorizing Provider: Carlus Pavlov    Ordering User: Pollie Meyer

## 2023-01-06 ENCOUNTER — Encounter: Payer: Self-pay | Admitting: Internal Medicine

## 2023-01-06 NOTE — Telephone Encounter (Signed)
 Care team updated and letter sent for eye exam notes.

## 2023-01-09 NOTE — Addendum Note (Signed)
Addended by: Pollie Meyer on: 01/09/2023 04:24 PM   Modules accepted: Orders

## 2023-01-23 ENCOUNTER — Telehealth: Payer: Self-pay

## 2023-01-23 ENCOUNTER — Telehealth: Payer: Self-pay | Admitting: Dietician

## 2023-01-23 ENCOUNTER — Other Ambulatory Visit (HOSPITAL_COMMUNITY): Payer: Self-pay

## 2023-01-23 NOTE — Telephone Encounter (Signed)
 Pt needs PA for Dexcom G6 Transmitter.

## 2023-01-23 NOTE — Telephone Encounter (Signed)
 Message has been sent to the PA team FYI.

## 2023-01-23 NOTE — Telephone Encounter (Signed)
 Patient came in to office today and picked up [facility] G6 sensor and transmitter.

## 2023-01-23 NOTE — Telephone Encounter (Signed)
 Pharmacy Patient Advocate Encounter   Received notification from Pt Calls Messages that prior authorization for Dexcom G6 transmitter is required/requested.   Insurance verification completed.   The patient is insured through [facility] .   Per test claim: PA required; PA submitted to above mentioned insurance via CoverMyMeds Key/confirmation #/EOC [id number] Status is pending

## 2023-01-23 NOTE — Telephone Encounter (Signed)
 Patietn arrived at the office to pick up her sensor.  Upon arriving, she stated that she needed the transmitter for the G-6 as well.  Provided her a Dexcom G6 CGM kit - lot [id number], Expiration 02/02/2023.  She did not take the sensor already documented as this kit contains one sensor.  She also stated that she needs a prior authorization for the transmitter, not the sensor.  Leita Constable, RD, LDN, CDCES

## 2023-01-23 NOTE — Telephone Encounter (Signed)
 Patient called and stated that she is unable to get her Dexcom G6 sensor as the pharmacy requires prior authorization.  Message sent to clinical pool to assist with this.  Patient is on an Omnipod 5 insulin  pump. Sample Dexcom G6 sensor left at the front desk for patient to pick up.  Lot [id number] Expiration 09/14/2023  Leita Constable, RD, LDN, CDCES

## 2023-01-24 ENCOUNTER — Telehealth: Payer: Self-pay | Admitting: Internal Medicine

## 2023-01-24 ENCOUNTER — Encounter: Payer: Self-pay | Admitting: Internal Medicine

## 2023-01-24 NOTE — Telephone Encounter (Signed)
 Pt has been notified and voices understanding.

## 2023-01-24 NOTE — Telephone Encounter (Signed)
 I called and spoke with the patient she states that her blood sugar has been running high in the 300s and HI on her pump. She states that you advised her to call if her readings are still high. This is what she is currently taking:  1 mg of Ozempic  26 Units of humalog 

## 2023-01-24 NOTE — Telephone Encounter (Signed)
 J, Her blood sugars appear to have improved since last visit and they are now better controlled overnight.  However, there is a steep increase in blood sugars after she starts eating and reviewing the pump downloads, she is not bolusing before the meals, but mostly after the meals.  She absolutely needs to start the Humalog  boluses 15 minutes before every meal.  I would not suggest other changes for now, especially with the improved blood sugars.

## 2023-01-24 NOTE — Telephone Encounter (Signed)
 Pharmacy Patient Advocate Encounter  Received notification from [facility] that Prior Authorization for [facility] G6 transmitter has been APPROVED through 07/23/23   PA #/Case ID/Reference #: [id number]

## 2023-01-24 NOTE — Telephone Encounter (Signed)
 Patient is calling to ask about a recent sensor prescription that she picked up.  Patient also states that her blood sugar level is still high even after changing her insulin.

## 2023-02-13 ENCOUNTER — Encounter: Payer: Self-pay | Admitting: Internal Medicine

## 2023-02-13 ENCOUNTER — Ambulatory Visit: Payer: Medicaid Other | Admitting: Internal Medicine

## 2023-02-13 NOTE — Progress Notes (Deleted)
 Patient ID: Anita Massey, female   DOB: [birth year], 43 y.o.   MRN: [id number] This note was precharted 12/16/2022.  HPI: Anita Massey is a 43 y.o.-year-old female, self-referred, for management of DM2, dx in 2020, insulin-dependent since 2022, uncontrolled, with long term complications (PN).  Her mother Anita Massey) is also my patient.  Last visit 1.5 month ago. She previously saw Dr. Roanna Massey. She has Medicaid.  Interim history: She has increased urination, no blurry vision, and fatigue.  No nausea or chest pain.  Insulin pump: - Omnipod 5 - started 10/09/2022 then came off after running out of pods as she was not given a refill from the pharmacy - iLet pump was not covered by her insurance - now back on OmniPod 5  CGM: - Dexcom G6  Insulin: - Humalog U200 initially in the pump - now Humalog U100 starting 10/10/2022  Supplies: - [facility] - [address] [place]  Reviewed HbA1c: Lab Results  Component Value Date   HGBA1C 11.1 (A) 01/01/2023   HGBA1C 14.2 (A) 09/24/2022   HGBA1C 11.1 (A) 05/29/2022   HGBA1C 9.7 (A) 02/28/2022   HGBA1C 9.5 (A) 12/28/2021   HGBA1C 9.9 (A) 10/19/2021   HGBA1C 8.9 (A) 05/11/2021   HGBA1C 9.7 (A) 02/16/2021   HGBA1C 9.3 (A) 08/28/2020   HGBA1C 9.9 (H) 03/28/2020  11/23/2021 (records from [place] accessed by pt. During the appt.): HbA1c calculated from fructosamine: 8.1%  At last visit she was on: - Humalog 7 to 14  >> 26 units before every meal (2x a day) - Trulicity 0.75 mg weekly >> Ozempic 0.25 mg weekly - Lantus 15 >>... 40 units  in am (for the last week) or at bedtime >> 30 units at bedtime She was not able to tolerate metformin due to nausea but also tingling. She was not able to tolerate higher doses of Trulicity due to stool incontinence. She was previously on Farxiga 10 mg before breakfast >> stopped in 2024 b/c not covered  We changed to: OmniPod 5 insulin pump with Dexcom CGM: - Basal rates: 12 am:  1.1 >> 1.4  units/h - Insulin to carb ratio: 12 am:  1:1 (enter 40-50% more carbs and bolus 15 min before meals, and also do frequent corrections for sugars >180) - Target: 12 am: 120-120 - Correction factor (insulin sensitivity factor):  12 am:  50 - Active insulin time: 4h - Changes infusion site: q2-3 days  Total daily dose from basal insulin: 66% >> 69% >> 23.3 units (53%) Total daily dose from bolus insulin: 34% >> 31% >> 20.5 units (47%) TDD: 36-70  >> 44-80 units per day She is only staining the optimal 17% of the time.  She is also on Ozempic 0.5 >> 1 mg weekly.  She checks sugars more than 4 times a day with her CGM:  Previously:   Previously:   Lowest sugar was 60s >> 50s >> 100; hypoglycemia awareness at 100. Highest sugar was HI >> HI >> HI  Glucometer: Accu-Chek guide  Pt's meals are: - Breakfast: oatmeal, cereal, eggs - Lunch: sandwich, pizza - Dinner: chicken or other meat, veggies, salad, baked potatoes - Snacks: 2 Stopped sweet tea and sodas  -only has these rarely now.  - no CKD, last BUN/creatinine:  Lab Results  Component Value Date   BUN 14 11/11/2022   BUN 15 08/28/2020   CREATININE 0.68 11/11/2022   CREATININE 0.68 08/28/2020   Lab Results  Component Value Date   MICRALBCREAT 2.3  11/11/2022   MICRALBCREAT 3.2 09/24/2022   MICRALBCREAT 11.0 03/28/2020  She is not on ACE inhibitor/ARB.  -+ HL; last set of lipids: Lab Results  Component Value Date   CHOL 140 11/11/2022   HDL 36.80 (L) 11/11/2022   LDLCALC 82 11/11/2022   TRIG 107.0 11/11/2022   CHOLHDL 4 11/11/2022  In 04/2022, we started Lipitor 40 mg daily.  - last eye exam was 2024 >> No DR reportedly.Elevated IO pressure. On eye drops. Dr. Sherryll Massey.  - + numbness and tingling in her feet.  Last foot exam was on 11/11/2022.  Pt has FH of DM in mother (who is also my patient), and maternal side of the family.  She has a history of yeast infections for which she intermittently takes   Diflucan. She also has history of HTN, microscopic hematuria, elevated liver enzymes, GERD, obesity (in the past, she was weighing over 200 pounds), anemia, rosacea, homozygosity of MTHFR variant, anxiety.  ROS: + See HPI  Past Medical History:  Diagnosis Date   Anemia    Anxiety state, unspecified    Blood dyscrasia    PROTHROMBIN G 16109 HETEROZYGOSITY (FACTOR 2)   Clotting disorder (HCC)    Diabetes mellitus without complication (HCC)    GERD (gastroesophageal reflux disease)    OCC   Homozygous for MTHFR gene mutation    Hypertension    Hypothyroidism    Multiple joint pain 09/04/2015   Obesity    Rosacea    Undiagnosed cardiac murmurs    Unspecified essential hypertension    Past Surgical History:  Procedure Laterality Date   APPENDECTOMY  2009   [facility]   CESAREAN SECTION  2016   CESAREAN SECTION N/A 11/15/2018   Procedure: REPEAT CESAREAN SECTION;  Surgeon: Anita Milch, MD;  Location: [facility] ORS;  Service: Obstetrics;  Laterality: N/A;  Female born @ 29 Apgars: 8/9 Weight: 6lbs 10 ozs   CHOLECYSTECTOMY N/A 03/20/2016   Procedure: LAPAROSCOPIC CHOLECYSTECTOMY;  Surgeon: Anita Ro, MD;  Location: [facility] ORS;  Service: General;  Laterality: N/A;   DILATION AND CURETTAGE OF UTERUS     Social History   Socioeconomic History   Marital status: Married    Spouse name: Anita Massey   Number of children: 3   Years of education: Not on file   Highest education level: Not on file  Occupational History   Occupation: Homemaker  Tobacco Use   Smoking status: Former    Current packs/day: 0.00    Average packs/day: 0.3 packs/day for 1 year (0.3 ttl pk-yrs)    Types: Cigarettes    Start date: 03/13/1997    Quit date: 03/13/1998    Years since quitting: 24.9   Smokeless tobacco: Never  Vaping Use   Vaping status: Never Used  Substance and Sexual Activity   Alcohol use: No   Drug use: No   Sexual activity: Not Currently    Birth control/protection: I.U.D.     Comment: Mirena   Other Topics Concern   Not on file  Social History Narrative          Social Drivers of Health   Financial Resource Strain: Low Risk  (11/13/2018)   Overall Financial Resource Strain (CARDIA)    Difficulty of Paying Living Expenses: Not hard at all  Food Insecurity: No Food Insecurity (11/13/2018)   Hunger Vital Sign    Worried About Running Out of Food in the Last Year: Never true    Ran Out of Food in the Last  Year: Never true  Transportation Needs: No Transportation Needs (11/13/2018)   PRAPARE - Administrator, Civil Service (Medical): No    Lack of Transportation (Non-Medical): No  Physical Activity: Inactive (11/13/2018)   Exercise Vital Sign    Days of Exercise per Week: 0 days    Minutes of Exercise per Session: 0 min  Stress: Stress Concern Present (11/13/2018)   [employer] of Occupational Health - Occupational Stress Questionnaire    Feeling of Stress : Very much  Social Connections: Unknown (11/13/2018)   Social Connection and Isolation Panel [NHANES]    Frequency of Communication with Friends and Family: More than three times a week    Frequency of Social Gatherings with Friends and Family: More than three times a week    Attends Religious Services: Patient declined    Database administrator or Organizations: Patient declined    Attends Banker Meetings: Patient declined    Marital Status: Married  Catering manager Violence: Not At Risk (11/13/2018)   Humiliation, Afraid, Rape, and Kick questionnaire    Fear of Current or Ex-Partner: No    Emotionally Abused: No    Physically Abused: No    Sexually Abused: No   Current Outpatient Medications on File Prior to Visit  Medication Sig Dispense Refill   Accu-Chek Softclix Lancets lancets Use to check blood sugar 3-4 times daily 100 each 12   ALPRAZolam (XANAX) 0.25 MG tablet Take 1 tablet (0.25 mg total) by mouth 2 (two) times daily as needed for anxiety. 30 tablet 0    amLODipine (NORVASC) 10 MG tablet Take 1 tablet (10 mg total) by mouth daily. 90 tablet 3   atorvastatin (LIPITOR) 40 MG tablet Take 1 tablet (40 mg total) by mouth daily. 90 tablet 3   celecoxib (CELEBREX) 200 MG capsule Take 1 capsule (200 mg total) by mouth daily. 30 capsule 2   Continuous Glucose Sensor (DEXCOM G7 SENSOR) MISC Use to monitor glucose continuously change every 10 days 6 each 3   Continuous Glucose Transmitter (DEXCOM G6 TRANSMITTER) MISC 1 Device by Does not apply route every 3 (three) months. 1 each 3   dorzolamide-timolol (COSOPT) 2-0.5 % ophthalmic solution 1 drop 2 (two) times daily.     fluconazole (DIFLUCAN) 150 MG tablet Take 1 tablet (150 mg total) by mouth once a week. As needed 4 tablet 3   fluticasone (FLONASE) 50 MCG/ACT nasal spray Place 2 sprays into both nostrils daily. 16 g 0   glucose blood (ACCU-CHEK GUIDE) test strip Use to check blood sugar 3-4 times daily 100 each 12   Insulin Disposable Pump (OMNIPOD 5 DEXG7G6 INTRO GEN 5) KIT 1 each by Does not apply route as needed. 1 kit 0   Insulin Disposable Pump (OMNIPOD 5 DEXG7G6 INTRO GEN 5) KIT Change pod every other day 1 kit 0   Insulin Disposable Pump (OMNIPOD 5 G7 PODS, GEN 5,) MISC 1 each by Does not apply route every other day. 45 each 3   insulin glargine (LANTUS) 100 UNIT/ML injection Inject 0.3-0.4 mLs (30-40 Units total) into the skin 2 (two) times daily. 15 mL 11   insulin lispro (HUMALOG) 100 UNIT/ML injection Use up to 100 units in the insulin pump daily 30 mL 11   Insulin Pen Needle 32G X 4 MM MISC Use 4x a day 300 each 3   ketoconazole (NIZORAL) 2 % cream Apply 1 application topically in the morning and at bedtime. 60 g 5  nystatin (MYCOSTATIN/NYSTOP) powder Apply 1 Application topically 3 (three) times daily. 15 g 0   ondansetron (ZOFRAN ODT) 4 MG disintegrating tablet Take 1 tablet (4 mg total) by mouth every 8 (eight) hours as needed for nausea or vomiting. 20 tablet 0   Semaglutide, 1 MG/DOSE,  (OZEMPIC, 1 MG/DOSE,) 4 MG/3ML SOPN Inject 1 mg into the skin once a week. 9 mL 3   sertraline (ZOLOFT) 100 MG tablet Take 2 tablets (200 mg total) by mouth daily. 180 tablet 3   sulfamethoxazole-trimethoprim (BACTRIM DS) 800-160 MG tablet Take 1 tablet by mouth 2 (two) times daily. 14 tablet 0   terconazole (TERAZOL 7) 0.4 % vaginal cream Place 1 applicator vaginally at bedtime. 45 g 0   tizanidine (ZANAFLEX) 2 MG capsule Take 1 capsule (2 mg total) by mouth 3 (three) times daily as needed for muscle spasms. 20 capsule 0   valACYclovir (VALTREX) 1000 MG tablet Take 2 tablets (2,000 mg total) by mouth daily. And repeat once in 12 hours---for cold sore 20 tablet 0   No current facility-administered medications on file prior to visit.   Allergies  Allergen Reactions   Biaxin [Clarithromycin] Hives   Metformin And Related Nausea And Vomiting    Also bad tingling   Toradol [Ketorolac Tromethamine]     Chest pain    Prednisone Anxiety and Palpitations    "Makes me feel crazy"   Morphine And Codeine     Chest Pain    Trulicity [Dulaglutide] Diarrhea    Bowel incontinence on high dose, tolerating 0.75 mg ok per patient   Tape Rash    Paper tape is fine   Family History  Problem Relation Age of Onset   Mitral valve prolapse Mother    Arrhythmia Mother    Hypertension Mother    Diabetes Mother    Hypertension Father    Hyperlipidemia Father    Heart disease Maternal Grandfather    Lung cancer Maternal Grandfather    Cancer Maternal Grandfather    Diabetes Brother    Heart disease Maternal Aunt    Diabetes Maternal Aunt    Depression Maternal Aunt    Cancer Maternal Grandmother        ovarian melanoma   Diabetes Maternal Grandmother    Diabetes Maternal Uncle    PE: There were no vitals taken for this visit. Wt Readings from Last 3 Encounters:  01/01/23 158 lb 12.8 oz (72 kg)  11/13/22 161 lb 2 oz (73.1 kg)  11/11/22 159 lb (72.1 kg)   Constitutional: normal weight, in  NAD Eyes: EOMI, no exophthalmos ENT: no thyromegaly, no cervical lymphadenopathy Cardiovascular: RRR, No MRG Respiratory: CTA B Musculoskeletal: no deformities Skin: no rashes Neurological: + Mild tremor with outstretched hands  ASSESSMENT: 1. DM2, insulin-dependent, uncontrolled, with complications -Peripheral neuropathy  2. HL  PLAN:  1.  Complex patient with very difficult to control diabetes, previously on basal/bolus insulin regimen and GLP-1 receptor agonist, with persistently very elevated HbA1c levels, now slightly better on an insulin pump regimen.  She has the OmniPod 5 insulin pump integrated with the Dexcom CGM.  Sugars improved after switching to the pump but diabetes control is still hindered by the fact that she is very busy with her children and not bolusing before meals. -At last visit, HbA1c was 11.1% slightly better than before but still extremely elevated.  Sugars were very high, worse in the 2 weeks around Thanksgiving.  They were slightly decreasing overnight but increasing significantly after she  started to eat during the day.  Upon reviewing individual daily tracings, she did not introduce carbs into the pump consistently and sometimes was only bolusing once a day.  She was not doing enough corrections.  I did advise her to try to enter 40 to 50% more carbs into the pump and also increased her basal rates.  We also increased her Ozempic dose.  We also discussed about the importance of starting the boluses 15 minutes before meals. -At last visit we also discussed about the iLet pump.  However, this was not covered for her in the past.  This would be easier for her to maneuver since she can only announce the meals rather than enter carbs.  We may try this again in the near future. CGM interpretation: -At today's visit, we reviewed her CGM downloads: It appears that 0% of values are in target range (goal >70%), while 100% are higher than 180 (goal <25%), and 0% are lower than  70 (goal <4%).  The calculated average blood sugar is 340.  She is only introducing 28 g of carbs per day and has 1.8 entries a day, which is not enough. The projected HbA1c for the next 3 months (GMI) is ***. -Reviewing the CGM trends, *** - I suggested to:  Patient Instructions  Please continue: - Ozempic 1 mg weekly  Please use the following pump settings: - Basal rates: 12 am:  1.4 >> 1.8 units/h - Insulin to carb ratio: 12 am:  1:1 (enter 40-50% more carbs and bolus 15 min before meals, and also do frequent corrections for sugars >180) - Target: 12 am: 120-120 - Correction factor (insulin sensitivity factor):  12 am: 50  - Active insulin time: 4h - Change infusion site: q3 days  Please return in 1.5 months.  - we checked her HbA1c: 7%  - advised to check sugars at different times of the day - 4x a day, rotating check times - advised for yearly eye exams >> she is UTD - return to clinic in 1.5 months  2.HL -Latest lipid panel was reviewed from 10/2022: LDL slightly higher than goal of less than 70, HDL low, triglycerides at goal: Lab Results  Component Value Date   CHOL 140 11/11/2022   HDL 36.80 (L) 11/11/2022   LDLCALC 82 11/11/2022   TRIG 107.0 11/11/2022   CHOLHDL 4 11/11/2022  -She is on Lipitor 40 mg daily with good tolerance  Carlus Pavlov, MD PhD [facility] Endocrinology

## 2023-02-18 ENCOUNTER — Other Ambulatory Visit (HOSPITAL_COMMUNITY): Payer: Self-pay

## 2023-02-18 NOTE — Telephone Encounter (Signed)
PA request has been Approved. New Encounter created for follow up. For additional info see Pharmacy Prior Auth telephone encounter from 01/22/22.

## 2023-02-20 DIAGNOSIS — J069 Acute upper respiratory infection, unspecified: Secondary | ICD-10-CM | POA: Diagnosis not present

## 2023-02-20 DIAGNOSIS — Z03818 Encounter for observation for suspected exposure to other biological agents ruled out: Secondary | ICD-10-CM | POA: Diagnosis not present

## 2023-04-07 DIAGNOSIS — Z03818 Encounter for observation for suspected exposure to other biological agents ruled out: Secondary | ICD-10-CM | POA: Diagnosis not present

## 2023-04-07 DIAGNOSIS — J40 Bronchitis, not specified as acute or chronic: Secondary | ICD-10-CM | POA: Diagnosis not present

## 2023-04-07 DIAGNOSIS — B9689 Other specified bacterial agents as the cause of diseases classified elsewhere: Secondary | ICD-10-CM | POA: Diagnosis not present

## 2023-04-07 DIAGNOSIS — J019 Acute sinusitis, unspecified: Secondary | ICD-10-CM | POA: Diagnosis not present

## 2023-04-23 ENCOUNTER — Ambulatory Visit: Payer: Self-pay

## 2023-04-23 NOTE — Telephone Encounter (Signed)
 Copied from CRM [id number]. Topic: Clinical - Red Word Triage >> Apr 23, 2023  9:46 AM Pascal Lux wrote: Red Word that prompted transfer to Nurse Triage: Patient stated she is a diabetic and has a yeast infection, it's itching really bad. >> Apr 23, 2023  3:03 PM Almira Coaster wrote: Patient is calling to advise she does not need the refill for fluconazole , she contacted the pharmacy and they advised they have refills available.

## 2023-04-23 NOTE — Telephone Encounter (Signed)
 1st attempt-call cannot be completed without a "mailbox number", attempted to utilize [facility] call back number, invalid.   Copied from CRM [id number]. Topic: Clinical - Red Word Triage >> Apr 23, 2023  9:46 AM Anita Massey wrote: Red Word that prompted transfer to Nurse Triage: Patient stated she is a diabetic and has a yeast infection, it's itching really bad.

## 2023-04-23 NOTE — Telephone Encounter (Signed)
 3rd attempt-no contact made, again was prompted to enter  "voicemailbox number".  Routing to clinic.

## 2023-04-23 NOTE — Telephone Encounter (Signed)
 2nd attempt-call cannot be completed without a "mailbox number", entered CC number and states that it is invalid.

## 2023-04-23 NOTE — Telephone Encounter (Signed)
 Called and spoke to pt. She said she would prefer to have fluconazole. I advised her that 11-11-22 Dr Alphonsus Sias sent a rx for fluconazole for #4 tabs and 3 refills. Pt does not think she has used them all. She will call [facility] to see if she has refills. Will call back if she is out.

## 2023-06-26 ENCOUNTER — Other Ambulatory Visit: Payer: Self-pay | Admitting: Internal Medicine

## 2023-06-26 ENCOUNTER — Telehealth: Payer: Self-pay | Admitting: Dietician

## 2023-06-26 DIAGNOSIS — E1142 Type 2 diabetes mellitus with diabetic polyneuropathy: Secondary | ICD-10-CM

## 2023-06-26 MED ORDER — INSULIN LISPRO (1 UNIT DIAL) 100 UNIT/ML (KWIKPEN): 15.0000 [IU] | PEN_INJECTOR | Freq: Three times a day (TID) | SUBCUTANEOUS | 11 refills | Status: DC

## 2023-06-26 MED ORDER — INSULIN GLARGINE 100 UNIT/ML ~~LOC~~ SOLN: 30.0000 [IU] | Freq: Two times a day (BID) | SUBCUTANEOUS | 11 refills | Status: AC

## 2023-06-26 MED ORDER — INSULIN PEN NEEDLE 32G X 4 MM MISC: 3 refills | Status: AC

## 2023-06-26 NOTE — Telephone Encounter (Signed)
 Anita Massey, I will send the prescriptions.  I really wish she would not have waited this long.  Per review of the CGM data, the sugars are extremely high, barely visible on the report window, with a predicted HbA1c of 12.6%.  We contacted [facility] and they do not have PDM's.  Will contact the Insulet rep next. Thank you! C

## 2023-06-26 NOTE — Telephone Encounter (Signed)
 Patient called.  She states that her PDM for her Omnipod pump has not been working for a while and she does not have a way to give insulin .  Dexcom Clarity report reviewed and sensor readings are frequently >400.   I will message MD for prescription refill for PODs and insulin  injections off pump.  Instructed patient to call Omnipod 24/7 Tech support line and report the issue so that she can have the PDM replaced.  Instructed her to put the number as a contact into her phone.  Instructed her to call us  when she gets the new PDM so we can help her reprogram the settings.  Cydne Doyne, RD, LDN, CDCES, DipACLM

## 2023-06-27 ENCOUNTER — Other Ambulatory Visit: Payer: Self-pay

## 2023-06-27 ENCOUNTER — Telehealth: Payer: Self-pay | Admitting: Dietician

## 2023-06-27 DIAGNOSIS — B3731 Acute candidiasis of vulva and vagina: Secondary | ICD-10-CM

## 2023-06-27 MED ORDER — FLUCONAZOLE 150 MG PO TABS: 150.0000 mg | ORAL_TABLET | ORAL | 0 refills | Status: DC

## 2023-06-27 MED ORDER — DEXCOM G7 SENSOR MISC
1 refills | Status: AC
Start: 2023-06-27 — End: ?

## 2023-06-27 MED ORDER — OMNIPOD 5 G7 PODS (GEN 5) MISC: 1.0000 | 3 refills | Status: DC

## 2023-06-27 NOTE — Telephone Encounter (Signed)
 Called patient. She states that her PDM should arrive today. She states that she has a yeast infection and needs a refill of Diflucan .  Discussed with her that her blood glucose needs to have improved control and this is one of the causes of her yeast infection.  Spoke with Dr. Aldona Amel.  She gave a verbal order to CMA to send in a prescription.    She is not wearing a sensor and is out of PODS. CMA sent in a prescription for G6 sensors and PODS as well as the insulin  for daily injections until she can use her pump again.  I have given her my cell number to call when she gets the PDM so I can help her re-enter her settings.  She is to call me.  She has made an appointment to see Dr. Aldona Amel on 07/01/23.  Cydne Doyne, RD, LDN, CDCES, DipACLM

## 2023-06-27 NOTE — Telephone Encounter (Signed)
 Patient called and states that the Dexcom needs Prior authorization. Message sent to CMA. Discussed that she should check her blood glucose with a blood glucose meter until she has the sensor.  She states that she has some strips. Instructed her to go to the ER if she is having high blood glucose and nausea. Emphasized the importance of improved blood glucose control.  Cydne Doyne, RD, LDN, CDCES, DipACLM

## 2023-06-27 NOTE — Telephone Encounter (Signed)
 I called and spoke with the patient and she states that she spoke with the [facility] customer service and they advised her that her PDM would be here today but so far it has not showed up. I did advise to her that if she does not get the PDM today she needs to pick up the insulin  that was sent in yesterday by Dr. Aldona Amel.

## 2023-06-28 ENCOUNTER — Telehealth: Payer: Self-pay | Admitting: Dietician

## 2023-06-28 NOTE — Telephone Encounter (Signed)
 Patient has called to receive settings for her new Omnipod 5 PDM that she has received today.    Omnipod USERNAMEAzula Massey PW:  [email or web address]  Omnipod tech support was called during this phone visit to assist with her username and password.  They will also send her a new charger for her PDM as she has been using a charger that is not from Omnipod.  This new Omnipod charger should arrive in 5-7 days.    Chart reviewed. Patient programmed the following settings into her pump.  Max basal:  2.8 Basal rate:  12 a-12a =1.4 units/h ICR:  1:1 (she does not carbohydrate count) ISF 50 Max bolus 30 units Target 120 Correct above 120 Duration of insulin  action 4 hours  Patient is not at home and plans on starting a CGM sensor and POD when she gets home later today.  She will run the pump in automated mode. Instructed her to bolus 15 units for the carbohydrates in a medium meal.  She does not carbohydrate count.  She is to bolus 15 minutes before each meal.  Instructed her to be cautious about her carbohydrate intake with dinner tonight as she is not receiving insulin .  Discussed the importance of improved blood glucose control.  She notes increased thirst and increased urination.  She is concerned about her kidney function.  She has an appointment to see Dr. Aldona Amel on 07/01/2023.  Patient to call for further concerns or questions.  Cydne Doyne, RD, LDN, CDCES, DipACLM

## 2023-06-30 ENCOUNTER — Other Ambulatory Visit (HOSPITAL_COMMUNITY): Payer: Self-pay

## 2023-06-30 ENCOUNTER — Telehealth: Payer: Self-pay

## 2023-06-30 ENCOUNTER — Telehealth: Payer: Self-pay | Admitting: Dietician

## 2023-06-30 NOTE — Telephone Encounter (Signed)
 Pharmacy Patient Advocate Encounter  Received notification from [facility] that Prior Authorization for Dexcom G7 Sensor  has been APPROVED from 06/30/23 to 12/30/23. Ran test claim, Copay is $0. This test claim was processed through [facility]- copay amounts may vary at other pharmacies due to pharmacy/plan contracts, or as the patient moves through the different stages of their insurance plan.   PA #/Case ID/Reference #: [id number]

## 2023-06-30 NOTE — Telephone Encounter (Signed)
 Pharmacy Patient Advocate Encounter   Received notification from Pt Calls Messages that prior authorization for Dexcom G7 Sensor is required/requested.   Insurance verification completed.   The patient is insured through [facility] .   Per test claim: PA required; PA submitted to above mentioned insurance via CoverMyMeds Key/confirmation #/EOC [id number] Status is pending

## 2023-06-30 NOTE — Telephone Encounter (Signed)
 PA request has been Submitted. New Encounter has been or will be created for follow up. For additional info see Pharmacy Prior Auth telephone encounter from 06/30/23.

## 2023-06-30 NOTE — Telephone Encounter (Signed)
 Dexcom clarity report reviewed for follow up of Pump orders on 06/28/2023. Last day of CGM data is 06/26/2023. Attempted to call patient but she was not available and unable to leave a message. She was to restart her pump on 06/28/2023 pm.  Cydne Doyne, RD, LDN, CDCES, DipACLM

## 2023-07-01 ENCOUNTER — Ambulatory Visit (INDEPENDENT_AMBULATORY_CARE_PROVIDER_SITE_OTHER): Admitting: Internal Medicine

## 2023-07-01 ENCOUNTER — Encounter: Payer: Self-pay | Admitting: Internal Medicine

## 2023-07-01 VITALS — BP 120/70 | HR 101 | Ht 62.25 in | Wt 152.0 lb

## 2023-07-01 DIAGNOSIS — E1165 Type 2 diabetes mellitus with hyperglycemia: Secondary | ICD-10-CM | POA: Diagnosis not present

## 2023-07-01 DIAGNOSIS — E1142 Type 2 diabetes mellitus with diabetic polyneuropathy: Secondary | ICD-10-CM | POA: Diagnosis not present

## 2023-07-01 DIAGNOSIS — E785 Hyperlipidemia, unspecified: Secondary | ICD-10-CM | POA: Diagnosis not present

## 2023-07-01 DIAGNOSIS — Z7985 Long-term (current) use of injectable non-insulin antidiabetic drugs: Secondary | ICD-10-CM | POA: Diagnosis not present

## 2023-07-01 DIAGNOSIS — Z794 Long term (current) use of insulin: Secondary | ICD-10-CM

## 2023-07-01 LAB — POCT GLYCOSYLATED HEMOGLOBIN (HGB A1C): HbA1c POC (<> result, manual entry): >15 % (ref 4.0–5.6)

## 2023-07-01 LAB — GLUCOSE, POCT (MANUAL RESULT ENTRY): POC Glucose: 367 mg/dL — AB (ref 70–99)

## 2023-07-01 NOTE — Progress Notes (Signed)
 Patient ID: Anita Massey, female   DOB: Feb 20, 1980, 43 y.o.   MRN: [id number]  HPI: Anita Massey is a 43 y.o.-year-old female, self-referred, for management of DM2, dx in 2020, insulin -dependent since 2022, uncontrolled, with long term complications (PN).  Her mother Anita Massey) is also my patient.  Last visit 6 months ago (she did not return in 1.5 months as advised). She previously saw Dr. Vick Massey. She has Medicaid.  Interim history: She has increased urination, no blurry vision, and fatigue.  No nausea or chest pain. Before last visit, she lost her CGM transmitter.  We gave her one at the time of the last visit. Since last visit, she came off the pump as she did not have a PDM.  She was not taking insulin  injections either (for ~2 mo!) and sugars were extremely high.  She is now back on the pump for the last 2 days.  Insulin  pump: - Omnipod 5 - started 10/09/2022 then came off after running out of pods as she was not given a refill from the pharmacy - iLet pump was not covered by her insurance - now back on OmniPod 5  CGM: - Dexcom G6 >> G7   Insulin : - Humalog  U200 initially in the pump - then Humalog  U100 starting 10/10/2022  Supplies: - [facility] - [address] [place]  Reviewed HbA1c: Lab Results  Component Value Date   HGBA1C 11.1 (A) 01/01/2023   HGBA1C 14.2 (A) 09/24/2022   HGBA1C 11.1 (A) 05/29/2022   HGBA1C 9.7 (A) 02/28/2022   HGBA1C 9.5 (A) 12/28/2021   HGBA1C 9.9 (A) 10/19/2021   HGBA1C 8.9 (A) 05/11/2021   HGBA1C 9.7 (A) 02/16/2021   HGBA1C 9.3 (A) 08/28/2020   HGBA1C 9.9 (H) 03/28/2020  11/23/2021 (records from [place] accessed by pt. During the appt.): HbA1c calculated from fructosamine: 8.1%  At last visit she was on: - Humalog  7 to 14  >> 26 units before every meal (2x a day) - Trulicity  0.75 mg weekly >> Ozempic  0.25 mg weekly - Lantus  15 >>... 40 units  in am (for the last week) or at bedtime >> 30 units at bedtime She was not able  to tolerate metformin  due to nausea but also tingling. She was not able to tolerate higher doses of Trulicity  due to stool incontinence. She was previously on Farxiga  10 mg before breakfast >> stopped in 2024 b/c not covered  We changed to: OmniPod 5 insulin  pump with Dexcom CGM: - Basal rates: 12 am:  1.1 >> 1.4 units/h - Insulin  to carb ratio: 12 am:  1:1 (enter 40-50% more carbs and bolus 15 min before meals, and also do frequent corrections for sugars >180) - Target: 12 am: 120-120 - Correction factor (insulin  sensitivity factor):  12 am: 50  - Active insulin  time: 4h  Total daily dose from basal insulin : 66% >> 69% >> 73% Total daily dose from bolus insulin : 34% >> 31% >> 27% TDD: 36-70 >> 24-70 units per day  She is also on Ozempic  0.5 mg weekly.  She checks sugars more than 4 times a day with her CGM:  Prev.     Previously:  Lowest sugar was 60s >> 50s >> 100 >> 200s; hypoglycemia awareness at 100. Highest sugar was HI >> HI >> HI >> HI  Glucometer: Accu-Chek guide  Pt's meals are: - Breakfast: oatmeal, cereal, eggs - Lunch: sandwich, pizza - Dinner: chicken or other meat, veggies, salad, baked potatoes - Snacks: 2 Stopped sweet tea  and sodas  -only has these rarely now.  - no CKD, last BUN/creatinine:  Lab Results  Component Value Date   BUN 14 11/11/2022   BUN 15 08/28/2020   CREATININE 0.68 11/11/2022   CREATININE 0.68 08/28/2020   Lab Results  Component Value Date   MICRALBCREAT 2.3 11/11/2022   MICRALBCREAT 3.2 09/24/2022   MICRALBCREAT 11.0 03/28/2020  She is not on ACE inhibitor/ARB.  -+ HL; last set of lipids: Lab Results  Component Value Date   CHOL 140 11/11/2022   HDL 36.80 (L) 11/11/2022   LDLCALC 82 11/11/2022   TRIG 107.0 11/11/2022   CHOLHDL 4 11/11/2022  In 04/2022, we started Lipitor 40 mg daily.  - last eye exam was 2024 >> No DR reportedly. Elevated IO pressure. On eye drops. Dr. Mason Massey.  - + numbness and tingling in her  feet.  Last foot exam was on 11/11/2022.  Pt has FH of DM in mother (who is also my patient), and maternal side of the family.  She has a history of yeast infections for which she intermittently takes  Diflucan . She also has history of HTN, microscopic hematuria, elevated liver enzymes, GERD, obesity (in the past, she was weighing over 200 pounds), anemia, rosacea, homozygosity of MTHFR variant, anxiety.  ROS: + See HPI  Past Medical History:  Diagnosis Date   Anemia    Anxiety state, unspecified    Blood dyscrasia    PROTHROMBIN G 09811 HETEROZYGOSITY (FACTOR 2)   Clotting disorder (HCC)    Diabetes mellitus without complication (HCC)    GERD (gastroesophageal reflux disease)    OCC   Homozygous for MTHFR gene mutation    Hypertension    Hypothyroidism    Multiple joint pain 09/04/2015   Obesity    Rosacea    Undiagnosed cardiac murmurs    Unspecified essential hypertension    Past Surgical History:  Procedure Laterality Date   APPENDECTOMY  2009   [facility]   CESAREAN SECTION  2016   CESAREAN SECTION N/A 11/15/2018   Procedure: REPEAT CESAREAN SECTION;  Surgeon: Heron Lord, MD;  Location: [facility] ORS;  Service: Obstetrics;  Laterality: N/A;  Female born @ 32 Apgars: 8/9 Weight: 6lbs 10 ozs   CHOLECYSTECTOMY N/A 03/20/2016   Procedure: LAPAROSCOPIC CHOLECYSTECTOMY;  Surgeon: Alben Alma, MD;  Location: [facility] ORS;  Service: General;  Laterality: N/A;   DILATION AND CURETTAGE OF UTERUS     Social History   Socioeconomic History   Marital status: Married    Spouse name: Veryl Gottron   Number of children: 3   Years of education: Not on file   Highest education level: Not on file  Occupational History   Occupation: Homemaker  Tobacco Use   Smoking status: Former    Current packs/day: 0.00    Average packs/day: 0.3 packs/day for 1 year (0.3 ttl pk-yrs)    Types: Cigarettes    Start date: 03/13/1997    Quit date: 03/13/1998    Years since quitting: 25.3   Smokeless  tobacco: Never  Vaping Use   Vaping status: Never Used  Substance and Sexual Activity   Alcohol use: No   Drug use: No   Sexual activity: Not Currently    Birth control/protection: I.U.D.    Comment: Mirena    Other Topics Concern   Not on file  Social History Narrative          Social Drivers of Health   Financial Resource Strain: Low Risk  (11/13/2018)  Overall Financial Resource Strain (CARDIA)    Difficulty of Paying Living Expenses: Not hard at all  Food Insecurity: No Food Insecurity (11/13/2018)   Hunger Vital Sign    Worried About Running Out of Food in the Last Year: Never true    Ran Out of Food in the Last Year: Never true  Transportation Needs: No Transportation Needs (11/13/2018)   PRAPARE - Administrator, Civil Service (Medical): No    Lack of Transportation (Non-Medical): No  Physical Activity: Inactive (11/13/2018)   Exercise Vital Sign    Days of Exercise per Week: 0 days    Minutes of Exercise per Session: 0 min  Stress: Stress Concern Present (11/13/2018)   [employer] of Occupational Health - Occupational Stress Questionnaire    Feeling of Stress : Very much  Social Connections: Unknown (11/13/2018)   Social Connection and Isolation Panel    Frequency of Communication with Friends and Family: More than three times a week    Frequency of Social Gatherings with Friends and Family: More than three times a week    Attends Religious Services: Patient declined    Database administrator or Organizations: Patient declined    Attends Banker Meetings: Patient declined    Marital Status: Married  Catering manager Violence: Not At Risk (11/13/2018)   Humiliation, Afraid, Rape, and Kick questionnaire    Fear of Current or Ex-Partner: No    Emotionally Abused: No    Physically Abused: No    Sexually Abused: No   Current Outpatient Medications on File Prior to Visit  Medication Sig Dispense Refill   Accu-Chek Softclix Lancets  lancets Use to check blood sugar 3-4 times daily 100 each 12   ALPRAZolam  (XANAX ) 0.25 MG tablet Take 1 tablet (0.25 mg total) by mouth 2 (two) times daily as needed for anxiety. 30 tablet 0   amLODipine  (NORVASC ) 10 MG tablet Take 1 tablet (10 mg total) by mouth daily. 90 tablet 3   atorvastatin  (LIPITOR) 40 MG tablet Take 1 tablet (40 mg total) by mouth daily. 90 tablet 3   celecoxib  (CELEBREX ) 200 MG capsule Take 1 capsule (200 mg total) by mouth daily. 30 capsule 2   Continuous Glucose Sensor (DEXCOM G7 SENSOR) MISC Use to monitor glucose continuously change every 10 days 6 each 1   Continuous Glucose Transmitter (DEXCOM G6 TRANSMITTER) MISC 1 Device by Does not apply route every 3 (three) months. 1 each 3   dorzolamide-timolol (COSOPT) 2-0.5 % ophthalmic solution 1 drop 2 (two) times daily.     fluconazole  (DIFLUCAN ) 150 MG tablet Take 1 tablet (150 mg total) by mouth once a week. As needed 4 tablet 0   fluticasone  (FLONASE ) 50 MCG/ACT nasal spray Place 2 sprays into both nostrils daily. 16 g 0   glucose blood (ACCU-CHEK GUIDE) test strip Use to check blood sugar 3-4 times daily 100 each 12   Insulin  Disposable Pump (OMNIPOD 5 DEXG7G6 INTRO GEN 5) KIT 1 each by Does not apply route as needed. 1 kit 0   Insulin  Disposable Pump (OMNIPOD 5 DEXG7G6 INTRO GEN 5) KIT Change pod every other day 1 kit 0   Insulin  Disposable Pump (OMNIPOD 5 G7 PODS, GEN 5,) MISC 1 each by Does not apply route every other day. 45 each 3   insulin  glargine (LANTUS ) 100 UNIT/ML injection Inject 0.3 mLs (30 Units total) into the skin 2 (two) times daily. 30 mL 11   insulin  lispro (HUMALOG  KWIKPEN) 100  UNIT/ML KwikPen Inject 15-25 Units into the skin 3 (three) times daily. 30 mL 11   insulin  lispro (HUMALOG ) 100 UNIT/ML injection Use up to 100 units in the insulin  pump daily 30 mL 11   Insulin  Pen Needle 32G X 4 MM MISC Use 4x a day 300 each 3   ketoconazole  (NIZORAL ) 2 % cream Apply 1 application topically in the morning  and at bedtime. 60 g 5   nystatin  (MYCOSTATIN /NYSTOP ) powder Apply 1 Application topically 3 (three) times daily. 15 g 0   ondansetron  (ZOFRAN  ODT) 4 MG disintegrating tablet Take 1 tablet (4 mg total) by mouth every 8 (eight) hours as needed for nausea or vomiting. 20 tablet 0   Semaglutide , 1 MG/DOSE, (OZEMPIC , 1 MG/DOSE,) 4 MG/3ML SOPN Inject 1 mg into the skin once a week. 9 mL 3   sertraline  (ZOLOFT ) 100 MG tablet Take 2 tablets (200 mg total) by mouth daily. 180 tablet 3   sulfamethoxazole -trimethoprim  (BACTRIM  DS) 800-160 MG tablet Take 1 tablet by mouth 2 (two) times daily. 14 tablet 0   terconazole  (TERAZOL 7 ) 0.4 % vaginal cream Place 1 applicator vaginally at bedtime. 45 g 0   tizanidine  (ZANAFLEX ) 2 MG capsule Take 1 capsule (2 mg total) by mouth 3 (three) times daily as needed for muscle spasms. 20 capsule 0   valACYclovir  (VALTREX ) 1000 MG tablet Take 2 tablets (2,000 mg total) by mouth daily. And repeat once in 12 hours---for cold sore 20 tablet 0   No current facility-administered medications on file prior to visit.   Allergies  Allergen Reactions   Biaxin [Clarithromycin] Hives   Metformin  And Related Nausea And Vomiting    Also bad tingling   Toradol  [Ketorolac  Tromethamine ]     Chest pain    Prednisone  Anxiety and Palpitations    Makes me feel crazy   Morphine  And Codeine     Chest Pain    Trulicity  [Dulaglutide ] Diarrhea    Bowel incontinence on high dose, tolerating 0.75 mg ok per patient   Tape Rash    Paper tape is fine   Family History  Problem Relation Age of Onset   Mitral valve prolapse Mother    Arrhythmia Mother    Hypertension Mother    Diabetes Mother    Hypertension Father    Hyperlipidemia Father    Heart disease Maternal Grandfather    Lung cancer Maternal Grandfather    Cancer Maternal Grandfather    Diabetes Brother    Heart disease Maternal Aunt    Diabetes Maternal Aunt    Depression Maternal Aunt    Cancer Maternal Grandmother         ovarian melanoma   Diabetes Maternal Grandmother    Diabetes Maternal Uncle    PE: BP 120/70   Pulse (!) 101   Ht 5' 2.25 (1.581 m)   Wt 152 lb (68.9 kg)   SpO2 98%   BMI 27.58 kg/m  Wt Readings from Last 3 Encounters:  07/01/23 152 lb (68.9 kg)  01/01/23 158 lb 12.8 oz (72 kg)  11/13/22 161 lb 2 oz (73.1 kg)   Constitutional: normal weight, in NAD Eyes: EOMI, no exophthalmos ENT: no thyromegaly, no cervical lymphadenopathy Cardiovascular: Tachycardia, RR, No MRG Respiratory: CTA B Musculoskeletal: no deformities Skin: no rashes Neurological: + Mild tremor with outstretched hands  ASSESSMENT: 1. DM2, insulin -dependent, uncontrolled, with complications -Peripheral neuropathy  2. HL  PLAN:  1. Patient with very uncontrolled type 2 diabetes, previously on basal-bolus insulin  regimen and  GLP-1 receptor agonist, with extremely high blood sugars: 300-500 and an HbA1c of 14.2%.  After starting on the OmniPod 5, sugars improved, but at last visit they were again high and an HbA1c was 11.1%, decreased, but still above goal.  I recommended to increase her Ozempic  dose at that time and increase her basal rate and strongly advised her to bolus for all meals, as she was not doing a good job with this.  Sugars are increasing in a stepwise fashion during the day and they were decreasing overnight.  She was not introducing consistent carbs into the pump and sometimes only bolusing once a day.  I recommended to enter carbs and start the boluses 15 minutes before each meal.  Discussed about possibly using the ilet insulin  pump which was not covered last year for her, but could be covered in the new year.  This would be ideal for her due to the fact that she would only need to announce the meals rather than entering carbs. -Since last visit she came off the pump after she lost her PDM.  She was not on any insulin .  Sugars were very high.  I called in prescriptions for long-acting and short acting  insulin  for her but she was able to start back on insulin  pump 2 days ago CGM interpretation: -At today's visit, we reviewed her CGM downloads: It appears that 0% of values are in target range (goal >70%), while 100% are higher than 180 (goal <25%), and 0% are lower than 70 (goal <4%).  The calculated average blood sugar is 389.  The projected HbA1c for the next 3 months (GMI) is 12.6%. -Reviewing the CGM trends, sugars are extremely high, almost entirely invisible on the CGM window, 100% above 180.  We discussed that this is extremely dangerous, and I strongly advised her against coming off the insulin  in the future.  [name] does have problems with the pump, we can always use basal/bolus insulin  regimen, but she just needs to let us  know.  For now, she is back on the pump and the sugars started to improve slightly.  Will go ahead and increase her basal rate and, to avoid dropping her blood sugars too abruptly, I raised her target.  However, I strongly recommended the iLet insulin  pump and she agrees with this and we sent prescriptions to her pharmacy.  I am hoping that she can start this soon as possible.  We also gave her a sample CGM because she is off today after running out. -I did have a discussion with her that she is at very high risk of stroke, heart attack, kidney failure if she does not start to become more involved in her diabetes care.  She is very busy with her 3 kids and her husband who was recently stabbed but he is doing well now. - I suggested to:  Patient Instructions  Please continue: - Ozempic  1 mg weekly  Please use the following pump settings: - Basal rates: 12 am:  1.4 >> 1.8 units/h - Insulin  to carb ratio: 12 am:  1:1 (bolus 15 min before meals) - Target: 12 am: 120-120 >> 140-140 - Correction factor (insulin  sensitivity factor):  12 am: 50  - Active insulin  time: 4h - Changes infusion site: q3 days  Let's try the iLet insulin  pump.  Please return in 1.5 months.  -  we checked her HbA1c: >15% (abysmal) - advised to check sugars at different times of the day - 4x a day,  rotating check times - advised for yearly eye exams >> she is UTD - return to clinic in 1.5 months  2.HL - Latest lipid panel from 10/2022 showed a low HDL, otherwise fractions at goal: Lab Results  Component Value Date   CHOL 140 11/11/2022   HDL 36.80 (L) 11/11/2022   LDLCALC 82 11/11/2022   TRIG 107.0 11/11/2022   CHOLHDL 4 11/11/2022  - She is currently on Lipitor 40 mg daily, tolerated well  Emilie Harden, MD PhD [facility] Endocrinology

## 2023-07-01 NOTE — Patient Instructions (Addendum)
 Please continue: - Ozempic  1 mg weekly  Please use the following pump settings: - Basal rates: 12 am:  1.4 >> 1.8 units/h - Insulin  to carb ratio: 12 am:  1:1 (bolus 15 min before meals) - Target: 12 am: 120-120 >> 140-140 - Correction factor (insulin  sensitivity factor):  12 am: 50  - Active insulin  time: 4h - Changes infusion site: q3 days  Let's try the iLet insulin  pump.  Please return in 1.5 months.

## 2023-07-02 ENCOUNTER — Telehealth: Payer: Self-pay | Admitting: Nutrition

## 2023-07-02 NOTE — Telephone Encounter (Signed)
 Patient was shown the Beta bionic pump, and she reports that she would like to try this.  Paperwork filled out and faxed to [employer] for this

## 2023-07-03 ENCOUNTER — Telehealth: Payer: Self-pay | Admitting: Dietician

## 2023-07-03 NOTE — Telephone Encounter (Signed)
 Called patient. She saw MD on 07/01/2023 and noted that some of her pump settings were changed.  Patient states that MD changed these settings in the office. Patient with no questions at this time. Patient encouraged to call for any questions.  Cydne Doyne, RD, LDN, CDCES, DipACLM

## 2023-07-22 ENCOUNTER — Telehealth: Payer: Self-pay | Admitting: Internal Medicine

## 2023-07-22 NOTE — Telephone Encounter (Signed)
 Patient is calling to check on the paperwork for the iLET insulin  pump.  Patient states that she has spoken with [employer] and they told her that they are waiting on the paperwork from Dr. Trixie.

## 2023-07-24 ENCOUNTER — Telehealth: Payer: Self-pay | Admitting: Internal Medicine

## 2023-07-24 ENCOUNTER — Telehealth: Payer: Self-pay

## 2023-07-24 DIAGNOSIS — E1142 Type 2 diabetes mellitus with diabetic polyneuropathy: Secondary | ICD-10-CM

## 2023-07-24 DIAGNOSIS — E1165 Type 2 diabetes mellitus with hyperglycemia: Secondary | ICD-10-CM | POA: Diagnosis not present

## 2023-07-24 NOTE — Telephone Encounter (Signed)
 Orders Placed This Encounter  Procedures   Glucose, fasting   C-peptide    For her to get the iLet pump she needs to have fasting labs done.

## 2023-07-24 NOTE — Telephone Encounter (Signed)
 Patient was in the office and has a rash from her ominpod.States that it itches,is inflammed and is flaky.Her contact info is [phone] Please advise

## 2023-07-25 LAB — C-PEPTIDE: C-Peptide: 0.41 ng/mL — ABNORMAL LOW (ref 0.80–3.85)

## 2023-07-25 LAB — GLUCOSE, FASTING: Glucose, Bld: 88 mg/dL (ref 65–99)

## 2023-07-25 NOTE — Telephone Encounter (Signed)
 I called and spoke with the patient. She states that this has happened before in the past. She has been switching sites every time she changes her pump. She has not tried anything otc for itchy skin/rashes.  Declines: using any new lotions or soaps  Is there anything you advise?

## 2023-07-25 NOTE — Telephone Encounter (Signed)
Pt has been advised and voices understanding.

## 2023-07-28 ENCOUNTER — Ambulatory Visit: Payer: Self-pay | Admitting: Internal Medicine

## 2023-07-29 ENCOUNTER — Telehealth: Payer: Self-pay | Admitting: Internal Medicine

## 2023-07-29 NOTE — Telephone Encounter (Signed)
 Patient is calling to check on the status of the iLet Insulin  Pump.  She states that [employer] has told her that they have not received anything from this office.

## 2023-07-30 NOTE — Telephone Encounter (Signed)
 See other message in patient chart

## 2023-08-06 DIAGNOSIS — E1165 Type 2 diabetes mellitus with hyperglycemia: Secondary | ICD-10-CM | POA: Diagnosis not present

## 2023-08-06 DIAGNOSIS — E1142 Type 2 diabetes mellitus with diabetic polyneuropathy: Secondary | ICD-10-CM | POA: Diagnosis not present

## 2023-08-08 ENCOUNTER — Telehealth: Payer: Self-pay | Admitting: Dietician

## 2023-08-08 NOTE — Telephone Encounter (Signed)
 Patient called and stated that her I-Let Pancrease insulin  pump will be delivered tomorrow and wishes to make an appointment for training.  Discussed that she will need to call us  next week as it is our policy to make the appointment for training after she has received the product.  Anita Massey, RD, LDN, CDCES, DipACLM

## 2023-08-13 ENCOUNTER — Telehealth: Payer: Self-pay

## 2023-08-13 ENCOUNTER — Telehealth: Payer: Self-pay | Admitting: Nutrition

## 2023-08-13 MED ORDER — FIASP 100 UNIT/ML IJ SOLN: INTRAMUSCULAR | 3 refills | Status: AC

## 2023-08-13 NOTE — Telephone Encounter (Signed)
 Pharmacy Patient Advocate Encounter   Received notification from CoverMyMeds that prior authorization for Ozempic  (1 MG/DOSE) 4MG /3ML pen-injectors is required/requested.   Insurance verification completed.   The patient is insured through [facility] .   Per test claim: PA required; PA submitted to above mentioned insurance via CoverMyMeds Key/confirmation #/EOC [id number] Status is pending

## 2023-08-13 NOTE — Telephone Encounter (Signed)
 Patient was started on her beta bionic pump today.  She is requesting that you order for her the Fiasp  insulin  in the prefilled vials for her for use in this pump.  I explained that it is a faster insulin , and she is wanting to try this if it ok with you.

## 2023-08-13 NOTE — Telephone Encounter (Signed)
 Requested Prescriptions   Signed Prescriptions Disp Refills   Insulin  Aspart, w/Niacinamide, (FIASP ) 100 UNIT/ML SOLN 90 mL 3    Sig: Use up to 100 units in the insulin  pump daily    Authorizing Provider: TRIXIE FILE    Ordering User: CLEOTILDE ROLIN RAMAN

## 2023-08-15 NOTE — Telephone Encounter (Signed)
 Pharmacy Patient Advocate Encounter  Received notification from [facility] that Prior Authorization for Ozempic  (1 MG/DOSE) 4MG /3ML pen-injectors has been DENIED.  Full denial letter will be uploaded to the media tab. See denial reason below.    It looks like they denied because her A1c increased.

## 2023-08-18 ENCOUNTER — Other Ambulatory Visit: Payer: Self-pay

## 2023-08-18 DIAGNOSIS — L210 Seborrhea capitis: Secondary | ICD-10-CM | POA: Diagnosis not present

## 2023-08-18 DIAGNOSIS — B852 Pediculosis, unspecified: Secondary | ICD-10-CM | POA: Diagnosis not present

## 2023-08-18 MED ORDER — FIASP PUMPCART 100 UNIT/ML ~~LOC~~ SOCT: SUBCUTANEOUS | 3 refills | Status: DC

## 2023-08-20 ENCOUNTER — Emergency Department
Admission: EM | Admit: 2023-08-20 | Discharge: 2023-08-21 | Disposition: A | Discharge status: Home / Self Care | Attending: Emergency Medicine | Admitting: Emergency Medicine

## 2023-08-20 ENCOUNTER — Telehealth: Payer: Self-pay

## 2023-08-20 ENCOUNTER — Other Ambulatory Visit: Payer: Self-pay

## 2023-08-20 ENCOUNTER — Encounter: Payer: Self-pay | Admitting: Emergency Medicine

## 2023-08-20 ENCOUNTER — Emergency Department

## 2023-08-20 DIAGNOSIS — T7840XA Allergy, unspecified, initial encounter: Secondary | ICD-10-CM | POA: Diagnosis not present

## 2023-08-20 DIAGNOSIS — E876 Hypokalemia: Secondary | ICD-10-CM | POA: Insufficient documentation

## 2023-08-20 DIAGNOSIS — T782XXA Anaphylactic shock, unspecified, initial encounter: Secondary | ICD-10-CM | POA: Diagnosis not present

## 2023-08-20 DIAGNOSIS — D72829 Elevated white blood cell count, unspecified: Secondary | ICD-10-CM | POA: Insufficient documentation

## 2023-08-20 DIAGNOSIS — I959 Hypotension, unspecified: Secondary | ICD-10-CM | POA: Diagnosis not present

## 2023-08-20 DIAGNOSIS — R0602 Shortness of breath: Secondary | ICD-10-CM | POA: Diagnosis not present

## 2023-08-20 DIAGNOSIS — R002 Palpitations: Secondary | ICD-10-CM | POA: Insufficient documentation

## 2023-08-20 DIAGNOSIS — R000 Tachycardia, unspecified: Secondary | ICD-10-CM | POA: Insufficient documentation

## 2023-08-20 DIAGNOSIS — R079 Chest pain, unspecified: Secondary | ICD-10-CM | POA: Diagnosis not present

## 2023-08-20 LAB — CBC WITH DIFFERENTIAL/PLATELET
Abs Immature Granulocytes: 0.09 K/uL — ABNORMAL HIGH (ref 0.00–0.07)
Basophils Absolute: 0.1 K/uL (ref 0.0–0.1)
Basophils Relative: 0 %
Eosinophils Absolute: 0.3 K/uL (ref 0.0–0.5)
Eosinophils Relative: 2 %
HCT: 39.6 % (ref 36.0–46.0)
Hemoglobin: 13.6 g/dL (ref 12.0–15.0)
Immature Granulocytes: 1 %
Lymphocytes Relative: 33 %
Lymphs Abs: 4.4 K/uL — ABNORMAL HIGH (ref 0.7–4.0)
MCH: 31 pg (ref 26.0–34.0)
MCHC: 34.3 g/dL (ref 30.0–36.0)
MCV: 90.2 fL (ref 80.0–100.0)
Monocytes Absolute: 0.8 K/uL (ref 0.1–1.0)
Monocytes Relative: 6 %
Neutro Abs: 7.8 K/uL — ABNORMAL HIGH (ref 1.7–7.7)
Neutrophils Relative %: 58 %
Platelets: 295 K/uL (ref 150–400)
RBC: 4.39 MIL/uL (ref 3.87–5.11)
RDW: 12.3 % (ref 11.5–15.5)
WBC: 13.4 K/uL — ABNORMAL HIGH (ref 4.0–10.5)
nRBC: 0 % (ref 0.0–0.2)

## 2023-08-20 LAB — COMPREHENSIVE METABOLIC PANEL WITH GFR
ALT: 14 U/L (ref 0–44)
AST: 21 U/L (ref 15–41)
Albumin: 3.6 g/dL (ref 3.5–5.0)
Alkaline Phosphatase: 93 U/L (ref 38–126)
Anion gap: 8 (ref 5–15)
BUN: 9 mg/dL (ref 6–20)
CO2: 20 mmol/L — ABNORMAL LOW (ref 22–32)
Calcium: 8.9 mg/dL (ref 8.9–10.3)
Chloride: 110 mmol/L (ref 98–111)
Creatinine, Ser: 0.57 mg/dL (ref 0.44–1.00)
GFR, Estimated: >60 mL/min (ref >60)
Glucose, Bld: 173 mg/dL — ABNORMAL HIGH (ref 70–99)
Potassium: 2.9 mmol/L — ABNORMAL LOW (ref 3.5–5.1)
Sodium: 138 mmol/L (ref 135–145)
Total Bilirubin: 0.5 mg/dL (ref 0.0–1.2)
Total Protein: 7.2 g/dL (ref 6.5–8.1)

## 2023-08-20 LAB — MAGNESIUM: Magnesium: 1.6 mg/dL — ABNORMAL LOW (ref 1.7–2.4)

## 2023-08-20 LAB — TROPONIN I (HIGH SENSITIVITY)
Troponin I (High Sensitivity): 5 ng/L (ref <18)
Troponin I (High Sensitivity): 4 ng/L (ref <18)

## 2023-08-20 LAB — D-DIMER, QUANTITATIVE: D-Dimer, Quant: 0.41 ug{FEU}/mL (ref 0.00–0.50)

## 2023-08-20 MED ORDER — LORAZEPAM 1 MG PO TABS
1.0000 mg | ORAL_TABLET | Freq: Once | ORAL | Status: AC
  Administered 2023-08-20: 1 mg via ORAL
  Filled 2023-08-20: qty 1

## 2023-08-20 MED ORDER — POTASSIUM CHLORIDE CRYS ER 20 MEQ PO TBCR
40.0000 meq | EXTENDED_RELEASE_TABLET | Freq: Once | ORAL | Status: AC
  Administered 2023-08-20: 40 meq via ORAL
  Filled 2023-08-20: qty 2

## 2023-08-20 MED ORDER — POTASSIUM CHLORIDE 10 MEQ/100ML IV SOLN
10.0000 meq | INTRAVENOUS | Status: DC
  Administered 2023-08-20: 10 meq via INTRAVENOUS
  Filled 2023-08-20: qty 100

## 2023-08-20 MED ORDER — SODIUM CHLORIDE 0.9 % IV BOLUS
1000.0000 mL | Freq: Once | INTRAVENOUS | Status: AC
  Administered 2023-08-20: 1000 mL via INTRAVENOUS

## 2023-08-20 MED ORDER — EPINEPHRINE 0.3 MG/0.3ML IJ SOAJ: 0.3000 mg | INTRAMUSCULAR | 0 refills | Status: DC | PRN

## 2023-08-20 NOTE — ED Notes (Signed)
 Upon rounding, pt reports decrease in generalized tremors and CP. Pt still reports HA, 6/10.

## 2023-08-20 NOTE — ED Notes (Addendum)
 Pt now reports head pain that started within the last hour, rates it 6/10. Pt still c/o generalized involuntary tremors.

## 2023-08-20 NOTE — ED Notes (Signed)
 Pt called out c/o pain at IV site where potassium is running. Pt requesting oral potassium replacement. Pt refusing IV replacement at this time due to pain. Ray, MD made aware. Ok to stop IV potassium.

## 2023-08-20 NOTE — ED Provider Notes (Signed)
 [facility] Provider Note    Event Date/Time   First MD Initiated Contact with Patient 08/20/23 2306     (approximate)   History   Allergic Reaction   HPI  NALEIGHA RAIMONDI is a 43 year old female presenting to the ER for evaluation of palpitations and possible allergic reaction.  Patient reports that she woke up today and felt like her heart was racing.  EMS was called and she began to feel like her throat was closing up with associated shortness of breath.  She was noted to be in what looks like an SVT with heart rates in the 160s.  She was treated with IM epi, 50 of Benadryl , Zofran , famotidine , fluids.  While en route, patient did have recurrent chest tightness and was given a second dose of epi.  Patient notes that she has had intermittent chest pain over the past few days.  Reports allergy to Bicillin, denies history of anaphylaxis.  Did start a sulfa  antibiotic 2 days ago to treat lice.  Reports that her daughter had lice, but she was checked and did not have lice but they gave her an antibiotic just in case.     Physical Exam   Triage Vital Signs: ED Triage Vitals  Encounter Vitals Group     BP 08/20/23 2012 138/78     Girls Systolic BP Percentile --      Girls Diastolic BP Percentile --      Boys Systolic BP Percentile --      Boys Diastolic BP Percentile --      Pulse Rate 08/20/23 2012 (!) 122     Resp 08/20/23 2012 (!) 22     Temp 08/20/23 2012 98.5 F (36.9 C)     Temp Source 08/20/23 2012 Oral     SpO2 08/20/23 2012 100 %     Weight 08/20/23 2013 170 lb (77.1 kg)     Height 08/20/23 2013 5' 2 (1.575 m)     Head Circumference --      Peak Flow --      Pain Score 08/20/23 2013 4     Pain Loc --      Pain Education --      Exclude from Growth Chart --     Most recent vital signs: Vitals:   08/20/23 2200 08/20/23 2342  BP: 120/63 119/62  Pulse: (!) 126 (!) 119  Resp: (!) 22 (!) 21  Temp: 98.3 F (36.8 C)   SpO2: 99% 100%      General: Awake, interactive  CV:  Tachycardic with regular rhythm, good peripheral perfusion. Resp:  Unlabored respirations, lungs clear to auscultation Abd:  Nondistended, soft, nontender Neuro:  Symmetric facial movement, fluid speech   ED Results / Procedures / Treatments   Labs (all labs ordered are listed, but only abnormal results are displayed) Labs Reviewed  CBC WITH DIFFERENTIAL/PLATELET - Abnormal; Notable for the following components:      Result Value   WBC 13.4 (*)    Neutro Abs 7.8 (*)    Lymphs Abs 4.4 (*)    Abs Immature Granulocytes 0.09 (*)    All other components within normal limits  COMPREHENSIVE METABOLIC PANEL WITH GFR - Abnormal; Notable for the following components:   Potassium 2.9 (*)    CO2 20 (*)    Glucose, Bld 173 (*)    All other components within normal limits  MAGNESIUM  - Abnormal; Notable for the following components:   Magnesium  1.6 (*)  All other components within normal limits  D-DIMER, QUANTITATIVE  TROPONIN I (HIGH SENSITIVITY)  TROPONIN I (HIGH SENSITIVITY)     EKG EKG independently reviewed and interpreted by myself demonstrates:  EKG demonstrates sinus tachycardia at a rate of 123, PR 148, QRS 105, QTc 457, nonspecific ST changes noted  RADIOLOGY Imaging independently reviewed and interpreted by myself demonstrates:   Formal Radiology Read:  No results found.  PROCEDURES:  Critical Care performed: Yes, see critical care procedure note(s)  CRITICAL CARE Performed by: Nilsa Dade   Total critical care time: 32 minutes  Critical care time was exclusive of separately billable procedures and treating other patients.  Critical care was necessary to treat or prevent imminent or life-threatening deterioration.  Critical care was time spent personally by me on the following activities: development of treatment plan with patient and/or surrogate as well as nursing, discussions with consultants, evaluation of patient's  response to treatment, examination of patient, obtaining history from patient or surrogate, ordering and performing treatments and interventions, ordering and review of laboratory studies, ordering and review of radiographic studies, pulse oximetry and re-evaluation of patient's condition.   Procedures   MEDICATIONS ORDERED IN ED: Medications  potassium chloride  10 mEq in 100 mL IVPB (10 mEq Intravenous Not Given 08/20/23 2345)  LORazepam  (ATIVAN ) tablet 1 mg (1 mg Oral Given 08/20/23 2135)  sodium chloride  0.9 % bolus 1,000 mL (0 mLs Intravenous Stopped 08/20/23 2343)  potassium chloride  SA (KLOR-CON  M) CR tablet 40 mEq (40 mEq Oral Given 08/20/23 2250)  sodium chloride  0.9 % bolus 1,000 mL (1,000 mLs Intravenous New Bag/Given 08/20/23 2346)     IMPRESSION / MDM / ASSESSMENT AND PLAN / ED COURSE  I reviewed the triage vital signs and the nursing notes.  Differential diagnosis includes, but is not limited to, anaphylaxis due to recent antibiotic, arrhythmia, anemia, electrolyte abnormality, PE  Patient's presentation is most consistent with acute presentation with potential threat to life or bodily function.  43 year old female presenting with palpitations and throat tightness, treated for anaphylaxis prior to presentation.  Here, patient tachycardic, but appears sinus.  Labs with leukocytosis with UC of 13.4, no obvious infectious source.  CMP with hypokalemia with K of 2.9.  Ordered for oral and IV repletion, but patient with very poor tolerance of IV repletion, so this was discontinued.  Troponin x 2 negative.  D-dimer within normal limits.  Will add on magnesium  level with low potassium and possible SVT prior to presentation.  Will also obtain x-Lashonta Pilling with her chest pain.  Does have some persistent tachycardia on reevaluation, but did receive multiple doses of epi prior to presentation.  Will trial second liter of IV fluids.  Signed out to oncoming physician at 2345 pending add on magnesium  level,  chest x-Ryan Palermo, IV fluids, completion of 4-hour observation time.  Patient currently reports chest pain is much improved.  [name] remains feeling improved without recurrent symptoms of anaphylaxis, suspect she will be stable for discharge with cardiology follow-up.  I have sent a prescription for 2 EpiPen 's to her pharmacy.  She was instructed to discontinue her sulfa  antibiotic and to not take any sulfa  antibiotics in the future and inform others of her allergic reaction.  Allergy added in her chart.  Clinical Course as of 08/20/23 2352  Wed Aug 20, 2023  2303 Presents with anaphylaxis - on bactrim  for a couple of days, felt like throat was closing and heart was racing. Epi x 2 given. EMS stated SVT. K mild  low, oral and IV replaced.  Mag added on. Trop x 2 neg.  Low risk wells, dimer neg.  [SM]    Clinical Course User Index [SM] Suzanne Kirsch, MD     FINAL CLINICAL IMPRESSION(S) / ED DIAGNOSES   Final diagnoses:  Palpitations  Anaphylaxis, initial encounter  Tachycardia     Rx / DC Orders   ED Discharge Orders          Ordered    Ambulatory referral to Cardiology       Comments: If you have not heard from the Cardiology office within the next 72 hours please call [phone].   08/20/23 2350    EPINEPHrine  0.3 mg/0.3 mL IJ SOAJ injection  As needed        08/20/23 2352             Note:  This document was prepared using Dragon voice recognition software and may include unintentional dictation errors.   Levander Slate, MD 08/20/23 2352

## 2023-08-20 NOTE — Discharge Instructions (Signed)
 You were seen in the ER today for your palpitations and throat discomfort.  We are concerned that you may have had a severe allergic reaction to your Bactrim /sulfa  antibiotic.  Please discontinue this and do not take any sulfa  containing products in the future.  I am also concerned you may have had an abnormal heart rhythm that I want you to follow-up with the cardiologist for.  Your potassium level was low.  I sent a prescription for additional days of potassium to your pharmacy.  I placed a referral to cardiology for you.  I have sent a prescription for an EpiPen  to your pharmacy if you have a severe allergic reaction in the future.  Please use this and then go to the nearest emergency department.  Return to the ER for new or worsening symptoms.

## 2023-08-20 NOTE — ED Triage Notes (Signed)
 Pt arrives via EMS who reports pt has been taking a sulfa  antibiotic x 2 days due to daughter having lice. Pt reports tonight feeling like her throat was closing up, sob, and racing heart. Pt was given 50 mg benadryl , 4mg  zofran , 20 mg famotidine , and 550 mL NS en route to ER. Pt reports right sided CP, sob, difficulty swallowing upon arrival. Pt states she has had intermittent CP the past couple days and CP started upon arrival to ER. FSBS enroute was 170. Pt 100 on RA.

## 2023-08-20 NOTE — Telephone Encounter (Signed)
 1.Patient called stating that she has been using more cartridges than what we advised/prescribed. She says she is using 1 cartridge a day vs changing every 3 days per her DME order when originally started on this pump. She is supplied #10 for cartridges, infusion sets and etc. Only has 3 left  Her insulin  script is for up to 100 units a day.   2. Her glucose current is 198 (after eating breakfast: BLT and Bacon egg and cheese sandwich.) time:11:08 am  3.Insulin  History from pump:  - Total Daily Basal: 48 units       -Breakfast: 6 units        -Lunch 17.2 units       -Dinner 5 units  4. Ranges:    - 27.9% very high    - 38.7% high    - 32.8% In Range    - .5% Low    - .1% very low Avg 221.3 mg/dL   I have called the iLet rep Kathye) because when I pull up the website with [employer] her name does not show up for us  to see her data so you can review the whole thing.   Overall: how would you like to proceed, since she will soon be out of supplies for her pump.

## 2023-08-21 DIAGNOSIS — R079 Chest pain, unspecified: Secondary | ICD-10-CM | POA: Diagnosis not present

## 2023-08-21 MED ORDER — MAGNESIUM OXIDE -MG SUPPLEMENT 400 (240 MG) MG PO TABS
400.0000 mg | ORAL_TABLET | Freq: Once | ORAL | Status: AC
  Administered 2023-08-21: 400 mg via ORAL
  Filled 2023-08-21: qty 1

## 2023-08-21 MED ORDER — POTASSIUM CHLORIDE CRYS ER 20 MEQ PO TBCR: 20.0000 meq | EXTENDED_RELEASE_TABLET | Freq: Two times a day (BID) | ORAL | 0 refills | Status: DC

## 2023-08-21 MED ORDER — FIASP PUMPCART 100 UNIT/ML ~~LOC~~ SOCT: SUBCUTANEOUS | 3 refills | Status: AC

## 2023-08-21 NOTE — ED Provider Notes (Signed)
 Care assumed of patient from outgoing provider.  See their note for initial history, exam and plan.  Clinical Course as of 08/21/23 0028  Wed Aug 20, 2023  2303 Presents with anaphylaxis - on bactrim  for a couple of days, felt like throat was closing and heart was racing. Epi x 2 given. EMS stated SVT. K mild low, oral and IV replaced.  Mag added on. Trop x 2 neg.  Low risk wells, dimer neg.  [SM]    Clinical Course User Index [SM] Suzanne Kirsch, MD   Given oral magnesium  replacement.  Patient states she is feeling much better and ready to go home.  Discussed recurrent episodes of anaphylaxis and how to use an EpiPen .  Discussed return precautions and no longer taking Bactrim .  Discussed follow-up as an outpatient with primary care.  No questions at time of discharge.   Suzanne Kirsch, MD 08/21/23 0030

## 2023-08-21 NOTE — ED Notes (Signed)
 Pt verbalizes wanting to leave. Pt requested Ivs be removed and pt removed self from cardiac monitor. Pt awaiting revaluation.

## 2023-08-21 NOTE — Telephone Encounter (Signed)
 I was able to obtain the patients pump down load and it says her TDD has been 120.4 units. I have sent in a new script for her cartridges say up to 130 units.   I also spoke with [employer] Rep) about her supply order and she states that the patient needs to reach out to her supplier to let them know that she needs a new order form to say change every day vs every 3 days. And they should fax over the order and cmn form for us  to fill out and sign.

## 2023-08-29 ENCOUNTER — Ambulatory Visit: Admitting: Internal Medicine

## 2023-09-01 ENCOUNTER — Ambulatory Visit

## 2023-09-01 VITALS — BP 118/80 | HR 80 | Ht 62.0 in | Wt 168.2 lb

## 2023-09-01 DIAGNOSIS — R079 Chest pain, unspecified: Secondary | ICD-10-CM | POA: Diagnosis not present

## 2023-09-01 DIAGNOSIS — I10 Essential (primary) hypertension: Secondary | ICD-10-CM

## 2023-09-01 DIAGNOSIS — R002 Palpitations: Secondary | ICD-10-CM | POA: Diagnosis not present

## 2023-09-01 DIAGNOSIS — E1069 Type 1 diabetes mellitus with other specified complication: Secondary | ICD-10-CM | POA: Diagnosis not present

## 2023-09-01 DIAGNOSIS — R9431 Abnormal electrocardiogram [ECG] [EKG]: Secondary | ICD-10-CM

## 2023-09-01 DIAGNOSIS — Z79899 Other long term (current) drug therapy: Secondary | ICD-10-CM

## 2023-09-01 DIAGNOSIS — R072 Precordial pain: Secondary | ICD-10-CM | POA: Diagnosis not present

## 2023-09-01 DIAGNOSIS — E782 Mixed hyperlipidemia: Secondary | ICD-10-CM

## 2023-09-01 MED ORDER — ATORVASTATIN CALCIUM 80 MG PO TABS: 80.0000 mg | ORAL_TABLET | Freq: Every day | ORAL | 3 refills | Status: AC

## 2023-09-01 MED ORDER — METOPROLOL TARTRATE 100 MG PO TABS: ORAL_TABLET | ORAL | 0 refills | Status: DC

## 2023-09-01 MED ORDER — ASPIRIN 81 MG PO TBEC
81.0000 mg | DELAYED_RELEASE_TABLET | Freq: Every day | ORAL | Status: AC
Start: 2023-09-01 — End: ?

## 2023-09-01 NOTE — Patient Instructions (Signed)
 Medication Instructions:  Your physician recommends the following medication changes.   START TAKING:  Aspirin  81 mg by mouth once daily  INCREASE: Atorvastatin  (Lipitor) from 40 mg to 80 mg by mouth once daily   *If you need a refill on your cardiac medications before your next appointment, please call your pharmacy*  Lab Work:  No labs ordered today   If you have labs (blood work) drawn today and your tests are completely normal, you will receive your results only by: MyChart Message (if you have MyChart) OR A paper copy in the mail If you have any lab test that is abnormal or we need to change your treatment, we will call you to review the results.  Testing/Procedures: Your physician has requested that you have an echocardiogram. Echocardiography is a painless test that uses sound waves to create images of your heart. It provides your doctor with information about the size and shape of your heart and how well your heart's chambers and valves are working.   You may receive an ultrasound enhancing agent through an IV if needed to better visualize your heart during the echo. This procedure takes approximately one hour.  There are no restrictions for this procedure.  This will take place at [address] [facility]) [address], [place] [address]  Please note: We ask at that you not bring children with you during ultrasound (echo/ vascular) testing. Due to room size and safety concerns, children are not allowed in the ultrasound rooms during exams. Our front office staff cannot provide observation of children in our lobby area while testing is being conducted. An adult accompanying a patient to their appointment will only be allowed in the ultrasound room at the discretion of the ultrasound technician under special circumstances. We apologize for any inconvenience.      Your cardiac CT will be scheduled at :  [facility] [address] [place], [place] [address] [phone]  Please arrive 15 mins early for check-in and test prep.  There is spacious parking and easy access (valet parking available) to the radiology department from the [facility] entrance. Please enter here and check-in with the desk attendant.   Please follow these instructions carefully (unless otherwise directed):  An IV will be required for this test and Nitroglycerin  will be given.    On the Night Before the Test: Be sure to Drink plenty of water. Do not consume any caffeinated/decaffeinated beverages or chocolate 12 hours prior to your test. Do not take any antihistamines 12 hours prior to your test.   On the Day of the Test: Drink plenty of water until 1 hour prior to the test. Do not eat any food 1 hour prior to test. You may take your regular medications prior to the test.  Take metoprolol  (Lopressor ) 100 mg two hours prior to test. Patients who wear a continuous glucose monitor MUST remove the device prior to scanning. FEMALES- please wear underwire-free bra if available, avoid dresses & tight clothing        After the Test: Drink plenty of water. After receiving IV contrast, you may experience a mild flushed feeling. This is normal. On occasion, you may experience a mild rash up to 24 hours after the test. This is not dangerous. If this occurs, you can take Benadryl  25 mg, Zyrtec, Claritin, or Allegra  and increase your fluid intake. (Patients taking Tikosyn should avoid Benadryl , and may take Zyrtec, Claritin, or Allegra ) If you experience  trouble breathing, this can be serious. If it is severe call 911 IMMEDIATELY. If it is mild, please call our office.  We will call to schedule your test 2-4 weeks out understanding that some insurance companies will need an authorization prior to the service being performed.   For more information and frequently asked questions, please visit our website :  [email or web address]  For non-scheduling related questions, please contact the cardiac imaging nurse navigator should you have any questions/concerns: Cardiac Imaging Nurse Navigators Direct Office Dial : [phone]   For scheduling needs, including cancellations and rescheduling, please call [place], [phone].    Follow-Up: At [facility], you and your health needs are our priority.  As part of our continuing mission to provide you with exceptional heart care, our providers are all part of one team.  This team includes your primary Cardiologist (physician) and Advanced Practice Providers or APPs (Physician Assistants and Nurse Practitioners) who all work together to provide you with the care you need, when you need it.  Your next appointment:   6 month(s)  Provider:   You may see Caron Poser, MD    We recommend signing up for the patient portal called MyChart.  Sign up information is provided on this After Visit Summary.  MyChart is used to connect with patients for Virtual Visits (Telemedicine).  Patients are able to view lab/test results, encounter notes, upcoming appointments, etc.  Non-urgent messages can be sent to your provider as well.   To learn more about what you can do with MyChart, go to [email or web address].

## 2023-09-01 NOTE — Progress Notes (Signed)
 Cardiology Office Note   Date:  09/01/2023  ID:  Anita Massey, DOB 1980/03/18, MRN [id number] PCP: Jimmy Charlie FERNS, MD  [facility] Providers Cardiologist:  Caron Poser, MD     History of Present Illness Anita Massey is a 43 y.o. female PMH DM1, HTN, who presents for further evaluation of palpitations.  Patient was recently seen in the ED on 08/20/2023 and was treated for a possible anaphylactic reaction to Bactrim .  She was thought to be in a possible SVT during the episode, so cardiology referral was placed.  She reported resolution of her symptoms with anaphylaxis treatments while in the ED.  Patient reports that she has not had any further palpitations since her discharge from the ED.  She says that she has them very intermittent, once per month or less.  The episodes are self-limited and do not last very long.  The thing she is more concerned about, is chest pain.  She reports chest pain at least once a week, sometimes at rest, sometimes with exertion.  The episodes do not last very long.  This is a new symptom for her.  She also reports that her diabetes has been very poorly controlled up until recently.  Relevant CVD History -Normal TTE 12/2017 -Normal renal artery duplex 03/2013   ROS: Pt denies any chest discomfort, jaw pain, arm pain, palpitations, syncope, presyncope, orthopnea, PND, or LE edema.  Studies Reviewed I have independently reviewed the patient's ECG, recent blood work and prior cardiovascular testing.  Physical Exam VS:  BP 118/80 (BP Location: Right Arm, Patient Position: Sitting, Cuff Size: Normal)   Pulse 80   Ht 5' 2 (1.575 m)   Wt 168 lb 3.2 oz (76.3 kg)   SpO2 97%   BMI 30.76 kg/m        Wt Readings from Last 3 Encounters:  09/01/23 168 lb 3.2 oz (76.3 kg)  08/20/23 170 lb (77.1 kg)  07/01/23 152 lb (68.9 kg)    GEN: No acute distress. NECK: No JVD; No carotid bruits. CARDIAC: RRR, no murmurs, rubs, gallops. RESPIRATORY:   Clear to auscultation. EXTREMITIES:  Warm and well-perfused. No edema.  ASSESSMENT AND PLAN Chest discomfort Q waves on ECG Patient presents with chest pain that is progressive.  Dull and aching character.  No clear discernible pattern at the moment.  However, she does have type 1 diabetes which has been poorly controlled, so further testing is warranted.  Her ECG also shows inferior Q waves.  Plan: - Start ASA 81 mg daily for presumed CAD - Increase Lipitor from 40 mg daily to 80 mg daily - Coronary CTA for further stratification; if she has no coronary plaque whatsoever, then we can stop the aspirin  - Echocardiography given palpitations and inferior Q waves  Palpitations Patient presented to the ED with palpitations, however, this was in the setting of anaphylaxis.  She has not any episodes since and rarely has them, less than once per month.  We discussed getting a monitor if she has more frequent episodes or if she has episodes that are lasting for longer periods of time.  For now, we will just plan to monitor her.  HTN Well-controlled currently.  Continue current regimen  HLD Last LDL 82 10/2022.  Given her type 1 diabetes, would opt for a LDL goal less than 70.  Plan: - Increase Lipitor to 80 mg daily        Dispo: RTC 6 months or earlier as needed; will follow  up on CV testing  Signed, Caron Poser, MD

## 2023-09-04 ENCOUNTER — Ambulatory Visit (INDEPENDENT_AMBULATORY_CARE_PROVIDER_SITE_OTHER): Admitting: Internal Medicine

## 2023-09-04 ENCOUNTER — Other Ambulatory Visit: Payer: Self-pay | Admitting: Internal Medicine

## 2023-09-04 ENCOUNTER — Encounter: Payer: Self-pay | Admitting: Internal Medicine

## 2023-09-04 ENCOUNTER — Telehealth: Payer: Self-pay

## 2023-09-04 ENCOUNTER — Telehealth: Payer: Self-pay | Admitting: Internal Medicine

## 2023-09-04 VITALS — BP 122/70 | HR 88 | Ht 62.0 in | Wt 170.2 lb

## 2023-09-04 DIAGNOSIS — E1142 Type 2 diabetes mellitus with diabetic polyneuropathy: Secondary | ICD-10-CM | POA: Diagnosis not present

## 2023-09-04 DIAGNOSIS — E1165 Type 2 diabetes mellitus with hyperglycemia: Secondary | ICD-10-CM

## 2023-09-04 DIAGNOSIS — E785 Hyperlipidemia, unspecified: Secondary | ICD-10-CM | POA: Diagnosis not present

## 2023-09-04 DIAGNOSIS — E1042 Type 1 diabetes mellitus with diabetic polyneuropathy: Secondary | ICD-10-CM

## 2023-09-04 DIAGNOSIS — E1065 Type 1 diabetes mellitus with hyperglycemia: Secondary | ICD-10-CM | POA: Diagnosis not present

## 2023-09-04 LAB — POCT GLYCOSYLATED HEMOGLOBIN (HGB A1C): Hemoglobin A1C: 9.4 % — AB (ref 4.0–5.6)

## 2023-09-04 MED ORDER — OZEMPIC (1 MG/DOSE) 4 MG/3ML ~~LOC~~ SOPN: 1.0000 mg | PEN_INJECTOR | SUBCUTANEOUS | 3 refills | Status: AC

## 2023-09-04 MED ORDER — AMLODIPINE BESYLATE 10 MG PO TABS: 10.0000 mg | ORAL_TABLET | Freq: Every day | ORAL | 0 refills | Status: DC

## 2023-09-04 NOTE — Patient Instructions (Signed)
 Please continue: - Ozempic  1 mg weekly  Please continue the iLet pump.  Announce all meals at the start of the meal, and not later.  Please return in 4 months.

## 2023-09-04 NOTE — Telephone Encounter (Signed)
 Patient was seen in office today and states that per pharmacy insurance is requiring prior authorization for Ozempic .

## 2023-09-04 NOTE — Addendum Note (Signed)
 Addended by: CLEOTILDE ROLIN RAMAN on: 09/04/2023 01:13 PM   Modules accepted: Orders

## 2023-09-04 NOTE — Telephone Encounter (Signed)
 Pharmacy Patient Advocate Encounter   Received notification from Pt Calls Messages that prior authorization for Ozempic  (1 MG/DOSE) 4MG /3ML pen-injectors is required/requested.   Insurance verification completed.   The patient is insured through [facility] .   Per test claim: PA required; PA submitted to above mentioned insurance via Latent Key/confirmation #/[id number] [employer] Status is pending

## 2023-09-04 NOTE — Telephone Encounter (Signed)
 PA request has been Submitted. New Encounter has been or will be created for follow up. For additional info see Pharmacy Prior Auth telephone encounter from 09-04-2023.

## 2023-09-04 NOTE — Telephone Encounter (Signed)
 Copied from CRM [id number]. Topic: Clinical - Medication Refill >> Sep 04, 2023  8:10 AM Thersia C wrote: Medication: amLODipine  (NORVASC ) 10 MG tablet  Has the patient contacted their pharmacy? Yes (Agent: If no, request that the patient contact the pharmacy for the refill. If patient does not wish to contact the pharmacy document the reason why and proceed with request.) (Agent: If yes, when and what did the pharmacy advise?)  This is the patient's preferred pharmacy:  [facility] [address] [place] - [address] [place] [address] Phone: [phone] Fax: [phone]  Is this the correct pharmacy for this prescription? Yes If no, delete pharmacy and type the correct one.   Has the prescription been filled recently? No  Is the patient out of the medication? Yes  Has the patient been seen for an appointment in the last year OR does the patient have an upcoming appointment? Yes  Can we respond through MyChart? Yes  Agent: Please be advised that Rx refills may take up to 3 business days. We ask that you follow-up with your pharmacy.

## 2023-09-04 NOTE — Progress Notes (Signed)
 Patient ID: Anita Massey, female   DOB: 1980/07/10, 43 y.o.   MRN: [id number]  HPI: Anita Massey is a 43 y.o.-year-old female, self-referred, for management of DM1, dx in 2020, but as DM1 in 07/2023, insulin -dependent since 2022, uncontrolled, with long term complications (PN).  Her mother Anita Massey) is also my patient.  Last visit 2 months ago. She previously saw Dr. Braulio. She has Medicaid.  Interim history: She had increased urination >> resolved, no blurry vision, no fatigue.  No nausea. She had chest pain >> Atorvastatin  dose increased. Since last visit, she was able to start the iLet insulin  pump.  Sugars improved dramatically in the last 2 weeks.  She also feels much better.  Insulin  pump: - Omnipod 5 - started 10/09/2022 then came off after running out of pods as she was not given a refill from the pharmacy - iLet pump was initially not covered by her insurance - back on OmniPod 5 - Finally able to start the iLet pump 08/13/2023  CGM: - Dexcom G6 >> G7   Insulin : - Humalog  U200 initially in the pump - then Humalog  U100 starting 10/10/2022 - now FiAsp  Cartridges  Supplies: [facility] - [address] [place]  Reviewed HbA1c: Lab Results  Component Value Date   HGBA1C >15.0 (>) 07/01/2023   HGBA1C 11.1 (A) 01/01/2023   HGBA1C 14.2 (A) 09/24/2022   HGBA1C 11.1 (A) 05/29/2022   HGBA1C 9.7 (A) 02/28/2022   HGBA1C 9.5 (A) 12/28/2021   HGBA1C 9.9 (A) 10/19/2021   HGBA1C 8.9 (A) 05/11/2021   HGBA1C 9.7 (A) 02/16/2021   HGBA1C 9.3 (A) 08/28/2020  11/23/2021 (records from [facility] accessed by pt. During the appt.): HbA1c calculated from fructosamine: 8.1%  At last visit she was on: - Humalog  7 to 14  >> 26 units before every meal (2x a day) - Trulicity  0.75 mg weekly >> Ozempic  0.25 mg weekly - Lantus  15 >>... 40 units  in am (for the last week) or at bedtime >> 30 units at bedtime She was not able to tolerate metformin  due to nausea but also  tingling. She was not able to tolerate higher doses of Trulicity  due to stool incontinence. She was previously on Farxiga  10 mg before breakfast >> stopped in 2024 b/c not covered  At last visit she was on: OmniPod 5 insulin  pump with Dexcom CGM: - Basal rates: 12 am:  1.1 >> 1.4 units/h - Insulin  to carb ratio: 12 am:  1:1 (enter 40-50% more carbs and bolus 15 min before meals, and also do frequent corrections for sugars >180) - Target: 12 am: 120-120 - Correction factor (insulin  sensitivity factor):  12 am: 50  - Active insulin  time: 4h  She is currently on iLet insulin  pump -she is announcing her meals: B: 83% of the time L: 91% of the time D: 100% of the time  Total daily dose from basal insulin : 66% >> 69% >> 73% >> approximately 35 units Total daily dose from bolus insulin : 34% >> 31% >> 27% >> approximately 49 units TDD: 36-70 >> 24-70 >> 83.4 units per day  She is also on Ozempic  0.5 >> 1 mg weekly, but off for last 2 weeks  She checks sugars more than 4 times a day with her CGM:    Prev.     Previously:  Lowest sugar was 60s >> 50s >> 100 >> 200s; hypoglycemia awareness at 100. Highest sugar was HI >> HI >> HI >> HI  Glucometer: Accu-Chek  guide  Pt's meals are: - Breakfast: oatmeal, cereal, eggs - Lunch: sandwich, pizza - Dinner: chicken or other meat, veggies, salad, baked potatoes - Snacks: 2 Stopped sweet tea and sodas  -only has these rarely now.  - no CKD, last BUN/creatinine:  Lab Results  Component Value Date   BUN 9 08/20/2023   BUN 14 11/11/2022   CREATININE 0.57 08/20/2023   CREATININE 0.68 11/11/2022   No results found for: MICRALBCREAT She is not on ACE inhibitor/ARB.  -+ HL; last set of lipids: Lab Results  Component Value Date   CHOL 140 11/11/2022   HDL 36.80 (L) 11/11/2022   LDLCALC 82 11/11/2022   TRIG 107.0 11/11/2022   CHOLHDL 4 11/11/2022  In 04/2022, we started Lipitor 40 mg daily. Now increased to 80 mg daily few  days ago.  - last eye exam was 2024 >> No DR reportedly. Elevated IO pressure. On eye drops. Dr. Maree.  - + numbness and tingling in her feet.  Last foot exam was on 11/11/2022.  Pt has FH of DM in mother (who is also my patient), and maternal side of the family.  She has a history of yeast infections for which she intermittently takes  Diflucan . She also has history of HTN, microscopic hematuria, elevated liver enzymes, GERD, obesity (in the past, she was weighing over 200 pounds), anemia, rosacea, homozygosity of MTHFR variant, anxiety.  ROS: + See HPI  Past Medical History:  Diagnosis Date   Anemia    Anxiety state, unspecified    Blood dyscrasia    PROTHROMBIN G 79789 HETEROZYGOSITY (FACTOR 2)   Clotting disorder (HCC)    Diabetes mellitus without complication (HCC)    GERD (gastroesophageal reflux disease)    OCC   Homozygous for MTHFR gene mutation    Hypertension    Hypothyroidism    Multiple joint pain 09/04/2015   Obesity    Rosacea    Undiagnosed cardiac murmurs    Unspecified essential hypertension    Past Surgical History:  Procedure Laterality Date   APPENDECTOMY  2009   [facility]   CESAREAN SECTION  2016   CESAREAN SECTION N/A 11/15/2018   Procedure: REPEAT CESAREAN SECTION;  Surgeon: Anita Claudell SAUNDERS, MD;  Location: [facility] ORS;  Service: Obstetrics;  Laterality: N/A;  Female born @ 16 Apgars: 8/9 Weight: 6lbs 10 ozs   CHOLECYSTECTOMY N/A 03/20/2016   Procedure: LAPAROSCOPIC CHOLECYSTECTOMY;  Surgeon: Anita JULIANNA Luna, MD;  Location: [facility] ORS;  Service: General;  Laterality: N/A;   DILATION AND CURETTAGE OF UTERUS     Social History   Socioeconomic History   Marital status: Married    Spouse name: Anita Massey   Number of children: 3   Years of education: Not on file   Highest education level: Not on file  Occupational History   Occupation: Homemaker  Tobacco Use   Smoking status: Former    Current packs/day: 0.00    Average packs/day: 0.3 packs/day for  1 year (0.3 ttl pk-yrs)    Types: Cigarettes    Start date: 03/13/1997    Quit date: 03/13/1998    Years since quitting: 25.4   Smokeless tobacco: Never  Vaping Use   Vaping status: Never Used  Substance and Sexual Activity   Alcohol use: No   Drug use: No   Sexual activity: Not Currently    Birth control/protection: I.U.D.    Comment: Mirena    Other Topics Concern   Not on file  Social History Narrative  Social Drivers of Corporate investment banker Strain: Low Risk  (11/13/2018)   Overall Financial Resource Strain (CARDIA)    Difficulty of Paying Living Expenses: Not hard at all  Food Insecurity: No Food Insecurity (11/13/2018)   Hunger Vital Sign    Worried About Running Out of Food in the Last Year: Never true    Ran Out of Food in the Last Year: Never true  Transportation Needs: No Transportation Needs (11/13/2018)   PRAPARE - Administrator, Civil Service (Medical): No    Lack of Transportation (Non-Medical): No  Physical Activity: Inactive (11/13/2018)   Exercise Vital Sign    Days of Exercise per Week: 0 days    Minutes of Exercise per Session: 0 min  Stress: Stress Concern Present (11/13/2018)   [employer] of Occupational Health - Occupational Stress Questionnaire    Feeling of Stress : Very much  Social Connections: Unknown (11/13/2018)   Social Connection and Isolation Panel    Frequency of Communication with Friends and Family: More than three times a week    Frequency of Social Gatherings with Friends and Family: More than three times a week    Attends Religious Services: Patient declined    Database administrator or Organizations: Patient declined    Attends Banker Meetings: Patient declined    Marital Status: Married  Catering manager Violence: Not At Risk (11/13/2018)   Humiliation, Afraid, Rape, and Kick questionnaire    Fear of Current or Ex-Partner: No    Emotionally Abused: No    Physically Abused: No     Sexually Abused: No   Current Outpatient Medications on File Prior to Visit  Medication Sig Dispense Refill   Accu-Chek Softclix Lancets lancets Use to check blood sugar 3-4 times daily 100 each 12   ALPRAZolam  (XANAX ) 0.25 MG tablet Take 1 tablet (0.25 mg total) by mouth 2 (two) times daily as needed for anxiety. 30 tablet 0   amLODipine  (NORVASC ) 10 MG tablet Take 1 tablet (10 mg total) by mouth daily. 90 tablet 3   aspirin  EC 81 MG tablet Take 1 tablet (81 mg total) by mouth daily. Swallow whole.     atorvastatin  (LIPITOR) 80 MG tablet Take 1 tablet (80 mg total) by mouth daily. 90 tablet 3   celecoxib  (CELEBREX ) 200 MG capsule Take 1 capsule (200 mg total) by mouth daily. (Patient not taking: Reported on 09/01/2023) 30 capsule 2   Continuous Glucose Sensor (DEXCOM G7 SENSOR) MISC Use to monitor glucose continuously change every 10 days 6 each 1   Continuous Glucose Transmitter (DEXCOM G6 TRANSMITTER) MISC 1 Device by Does not apply route every 3 (three) months. 1 each 3   dorzolamide-timolol (COSOPT) 2-0.5 % ophthalmic solution 1 drop 2 (two) times daily.     EPINEPHrine  0.3 mg/0.3 mL IJ SOAJ injection Inject 0.3 mg into the muscle as needed for anaphylaxis. 2 each 0   fluconazole  (DIFLUCAN ) 150 MG tablet Take 1 tablet (150 mg total) by mouth once a week. As needed 4 tablet 0   fluticasone  (FLONASE ) 50 MCG/ACT nasal spray Place 2 sprays into both nostrils daily. 16 g 0   glucose blood (ACCU-CHEK GUIDE) test strip Use to check blood sugar 3-4 times daily 100 each 12   Insulin  Aspart, w/Niacinamide, (FIASP  PUMPCART) 100 UNIT/ML SOCT Use up to 130 units in the insulin  pump daily 90 mL 3   Insulin  Aspart, w/Niacinamide, (FIASP ) 100 UNIT/ML SOLN Use up to 100 units  in the insulin  pump daily 90 mL 3   Insulin  Disposable Pump (OMNIPOD 5 DEXG7G6 INTRO GEN 5) KIT 1 each by Does not apply route as needed. (Patient not taking: Reported on 09/01/2023) 1 kit 0   Insulin  Disposable Pump (OMNIPOD 5 DEXG7G6  INTRO GEN 5) KIT Change pod every other day (Patient not taking: Reported on 09/01/2023) 1 kit 0   Insulin  Disposable Pump (OMNIPOD 5 G7 PODS, GEN 5,) MISC 1 each by Does not apply route every other day. 45 each 3   insulin  glargine (LANTUS ) 100 UNIT/ML injection Inject 0.3 mLs (30 Units total) into the skin 2 (two) times daily. 30 mL 11   insulin  lispro (HUMALOG  KWIKPEN) 100 UNIT/ML KwikPen Inject 15-25 Units into the skin 3 (three) times daily. 30 mL 11   insulin  lispro (HUMALOG ) 100 UNIT/ML injection Use up to 100 units in the insulin  pump daily 30 mL 11   Insulin  Pen Needle 32G X 4 MM MISC Use 4x a day 300 each 3   ketoconazole  (NIZORAL ) 2 % cream Apply 1 application topically in the morning and at bedtime. 60 g 5   metoprolol  tartrate (LOPRESSOR ) 100 MG tablet TAKE 1 TABLET 2 HR PRIOR TO CARDIAC PROCEDURE 1 tablet 0   nystatin  (MYCOSTATIN /NYSTOP ) powder Apply 1 Application topically 3 (three) times daily. 15 g 0   ondansetron  (ZOFRAN  ODT) 4 MG disintegrating tablet Take 1 tablet (4 mg total) by mouth every 8 (eight) hours as needed for nausea or vomiting. 20 tablet 0   potassium chloride  SA (KLOR-CON  M) 20 MEQ tablet Take 1 tablet (20 mEq total) by mouth 2 (two) times daily for 3 days. 6 tablet 0   Semaglutide , 1 MG/DOSE, (OZEMPIC , 1 MG/DOSE,) 4 MG/3ML SOPN Inject 1 mg into the skin once a week. 9 mL 3   sertraline  (ZOLOFT ) 100 MG tablet Take 2 tablets (200 mg total) by mouth daily. 180 tablet 3   terconazole  (TERAZOL 7 ) 0.4 % vaginal cream Place 1 applicator vaginally at bedtime. 45 g 0   tizanidine  (ZANAFLEX ) 2 MG capsule Take 1 capsule (2 mg total) by mouth 3 (three) times daily as needed for muscle spasms. 20 capsule 0   valACYclovir  (VALTREX ) 1000 MG tablet Take 2 tablets (2,000 mg total) by mouth daily. And repeat once in 12 hours---for cold sore 20 tablet 0   No current facility-administered medications on file prior to visit.   Allergies  Allergen Reactions   Biaxin [Clarithromycin]  Hives   Metformin  And Related Nausea And Vomiting    Also bad tingling   Sulfa  Antibiotics Anaphylaxis   Toradol  [Ketorolac  Tromethamine ]     Chest pain    Prednisone  Anxiety and Palpitations    Makes me feel crazy   Morphine  And Codeine     Chest Pain    Tramadol  Other (See Comments)   Trulicity  [Dulaglutide ] Diarrhea    Bowel incontinence on high dose, tolerating 0.75 mg ok per patient   Tape Rash    Paper tape is fine   Family History  Problem Relation Age of Onset   Mitral valve prolapse Mother    Arrhythmia Mother    Hypertension Mother    Diabetes Mother    Hypertension Father    Hyperlipidemia Father    Heart disease Maternal Grandfather    Lung cancer Maternal Grandfather    Cancer Maternal Grandfather    Diabetes Brother    Heart disease Maternal Aunt    Diabetes Maternal Aunt    Depression  Maternal Aunt    Cancer Maternal Grandmother        ovarian melanoma   Diabetes Maternal Grandmother    Diabetes Maternal Uncle    PE: BP 122/70   Pulse 88   Ht 5' 2 (1.575 m)   Wt 170 lb 3.2 oz (77.2 kg)   SpO2 98%   BMI 31.13 kg/m  Wt Readings from Last 3 Encounters:  09/04/23 170 lb 3.2 oz (77.2 kg)  09/01/23 168 lb 3.2 oz (76.3 kg)  08/20/23 170 lb (77.1 kg)   Constitutional: normal weight, in NAD Eyes: EOMI, no exophthalmos ENT: no thyromegaly, no cervical lymphadenopathy Cardiovascular: Tachycardia, RR, No MRG Respiratory: CTA B Musculoskeletal: no deformities Skin: no rashes Neurological: + Mild tremor with outstretched hands  ASSESSMENT: 1. DM1, insulin -dependent, uncontrolled, with complications -Peripheral neuropathy  Component     Latest Ref Rng 07/24/2023  Glucose     65 - 99 mg/dL 88   C-Peptide     9.19 - 3.85 ng/mL 0.41 (L)     2. HL  PLAN:  1. Patient with very uncontrolled type 1 diabetes, diagnosed as such recently, when a C-peptide returned low.  She is on a GLP-1 receptor agonist and was previously on the OmniPod 5 insulin  pump  but fortunately we were able to switch to the iLet pump 3 weeks ago, after her type 1 diabetes diagnosis.  The sugars improved dramatically afterwards. - HbA1c at last visit was undetectably high: >15%! CGM interpretation: -At today's visit, we reviewed her CGM downloads: It appears that 59% of values are in target range (goal >70%), while 41% are higher than 180 (goal <25%), and 0% are lower than 70 (goal <4%).  The calculated average blood sugar is 176.  The projected HbA1c for the next 3 months (GMI) is 7.5%. -Reviewing the CGM trends, sugars improved drastically since last visit.  Her sugars are usually fluctuating within the target range overnight but they increased significantly after breakfast.  They are occasionally higher after the rest of the meals, including lunch.  She is doing a good job announce eating 100% of her dinners, but not all of the breakfast and lunch meals.  We discussed about working on this, but for now, due to the dramatic improvement in the blood sugars, I did not recommend other changes. -She tells me that she is off Ozempic  and I called in another prescription for her. - I suggested to:  Patient Instructions  Please continue: - Ozempic  1 mg weekly  Please continue the iLet pump.  Announce all meals at the start of the meal, and not later.  Please return in 4 months.  - we checked her HbA1c: 9.4% (MUCH better) - advised to check sugars at different times of the day - 4x a day, rotating check times - advised for yearly eye exams >> she is not UTD -Check an ACR today will - return to clinic in 3 months  2.HL - Latest lipid fractions were reviewed from 10/2022: LDL above our target of less than 70, HDL low: Lab Results  Component Value Date   CHOL 140 11/11/2022   HDL 36.80 (L) 11/11/2022   LDLCALC 82 11/11/2022   TRIG 107.0 11/11/2022   CHOLHDL 4 11/11/2022  - She is currently on Lipitor 80 mg daily, tolerated well.  The dose was increased by cardiology  earlier this month.  Lela Fendt, MD PhD [facility]

## 2023-09-05 ENCOUNTER — Ambulatory Visit: Payer: Self-pay | Admitting: Internal Medicine

## 2023-09-05 DIAGNOSIS — E1165 Type 2 diabetes mellitus with hyperglycemia: Secondary | ICD-10-CM | POA: Diagnosis not present

## 2023-09-05 DIAGNOSIS — E1142 Type 2 diabetes mellitus with diabetic polyneuropathy: Secondary | ICD-10-CM | POA: Diagnosis not present

## 2023-09-05 LAB — MICROALBUMIN / CREATININE URINE RATIO
Creatinine, Urine: 42 mg/dL (ref 20–275)
Microalb Creat Ratio: 19 mg/g{creat} (ref <30)
Microalb, Ur: 0.8 mg/dL

## 2023-09-08 ENCOUNTER — Telehealth: Payer: Self-pay

## 2023-09-08 MED ORDER — AMLODIPINE BESYLATE 10 MG PO TABS: 10.0000 mg | ORAL_TABLET | Freq: Every day | ORAL | 0 refills | Status: DC

## 2023-09-08 NOTE — Telephone Encounter (Signed)
 Rx sent electronically.

## 2023-09-08 NOTE — Telephone Encounter (Signed)
 Pharmacy Patient Advocate Encounter  Received notification from [facility] that Prior Authorization for Ozempic  (1 MG/DOSE) 4MG /3ML pen-injectors has been APPROVED from 09/05/23 to 09/04/24

## 2023-09-25 ENCOUNTER — Encounter (HOSPITAL_COMMUNITY): Payer: Self-pay

## 2023-09-29 ENCOUNTER — Ambulatory Visit: Admission: RE | Admit: 2023-09-29 | Source: Ambulatory Visit

## 2023-10-09 DIAGNOSIS — H40053 Ocular hypertension, bilateral: Secondary | ICD-10-CM | POA: Diagnosis not present

## 2023-10-10 ENCOUNTER — Telehealth: Payer: Self-pay | Admitting: Dietician

## 2023-10-10 ENCOUNTER — Other Ambulatory Visit

## 2023-10-10 NOTE — Telephone Encounter (Signed)
 Sent via [facility]

## 2023-10-10 NOTE — Telephone Encounter (Signed)
 Patient called. She states that she needs prior authorization for tubing and connection pieces for the iLet pump.  She needs the prescription written to change these out every 2 days (not 3).    She states that she will run out of supplies in 12 days.  Will forward to CMA.  Leita Constable, RD, LDN, CDCES, DipACLM

## 2023-10-13 ENCOUNTER — Ambulatory Visit

## 2023-10-13 DIAGNOSIS — E1142 Type 2 diabetes mellitus with diabetic polyneuropathy: Secondary | ICD-10-CM | POA: Diagnosis not present

## 2023-10-13 DIAGNOSIS — E1165 Type 2 diabetes mellitus with hyperglycemia: Secondary | ICD-10-CM | POA: Diagnosis not present

## 2023-10-16 ENCOUNTER — Telehealth: Payer: Self-pay

## 2023-10-16 NOTE — Telephone Encounter (Signed)
 Patient has been advised that order was placed to [facility] on [facility] for her infusion sets for the ilet pump.  She said a new order was placed for a pump but she already has one. I told her it may have been placed if it's out of warranty. Patient also saying that a PA is needed for the pump and supplies.  I will send a message to the authorization team to see if this can be handle stat.

## 2023-10-16 NOTE — Telephone Encounter (Signed)
 Patient is calling to say that she received the supplies from [facility], but patient is very upset.  Patient states that she does NOT need the cartridges.  Patient states that she needs a month supply of tubing  pieces and connector piece for pump and nothing else.  Patient states that she did receive 10 of each in the package she received this morning, but that that will not last for a month because she changes them every two day.  Patient would like a return call when this is done or if there are any questions.

## 2023-10-16 NOTE — Telephone Encounter (Signed)
 Patient is aware that supplies were shipped on 10/13/23 and should arrive today by 9am per [facility] on [employer] portal.

## 2023-10-17 NOTE — Telephone Encounter (Signed)
 Call attempted to patient but unable to leave a VM.  Janelle Spellman,RMA  When the order is put in for her to change her infusion sent every 1.5-2 days it only allows 10 to be dispensed.

## 2023-10-17 NOTE — Telephone Encounter (Signed)
 I have sent a message to [facility] to find out why they are not sending enough infusion set for patient to change every 2 days. I place order for just infusion set but there was a prior order for a whole new pump and supplies.

## 2023-10-17 NOTE — Telephone Encounter (Signed)
 I called and spoke with Anita Massey at [employer]. She advised that we increase the order by saying she is changing every day because she is currently using 90-96 units a day, So I have placed a new order so she can get more cartridges and infusion sets.   I also tried reaching out to the patient but unable to leave a VM.

## 2023-10-20 ENCOUNTER — Ambulatory Visit: Payer: Self-pay

## 2023-10-20 NOTE — Telephone Encounter (Signed)
 FYI Only or Action Required?: Action required by provider: request for appointment.  Patient was last seen in primary care on 11/13/2022 by Watt Mirza, MD.  Called Nurse Triage reporting Arm Pain.  Symptoms began about a month ago.  Interventions attempted: Nothing.  Symptoms are: unchanged.  Triage Disposition: See PCP When Office is Open (Within 3 Days)  Patient/caregiver understands and will follow disposition?: YesCopied from CRM #[id number]. Topic: Clinical - Red Word Triage >> Oct 20, 2023 12:30 PM Anita Massey wrote: Red Word that prompted transfer to Nurse Triage: Patient called said she's been having pain and swelling in both arms. Reason for Disposition  [1] MODERATE pain (e.g., interferes with normal activities) AND [2] present > 3 days  Answer Assessment - Initial Assessment Questions Pt having trouble using fine motor skills with both arms. This has been going on for a month. Pt denies numbness.       1. ONSET: When did the pain start?     month 2. LOCATION: Where is the pain located?     Both arms- left is worse 3. PAIN: How bad is the pain? (Scale 0-10; or none, mild, moderate, severe)     Worse-6 4. WORK OR EXERCISE: Has there been any recent work or exercise that involved this part of the body?     Not sure 5. CAUSE: What do you think is causing the arm pain?     Not sure 6. OTHER SYMPTOMS: Do you have any other symptoms? (e.g., neck pain, swelling, rash, fever, numbness, weakness)    Left arm swelling  Protocols used: Arm Pain-A-AH

## 2023-10-20 NOTE — Telephone Encounter (Signed)
 Noted

## 2023-10-21 ENCOUNTER — Encounter: Payer: Self-pay | Admitting: General Practice

## 2023-10-21 ENCOUNTER — Ambulatory Visit (INDEPENDENT_AMBULATORY_CARE_PROVIDER_SITE_OTHER): Admitting: General Practice

## 2023-10-21 VITALS — BP 112/82 | HR 97 | Temp 98.7°F | Ht 62.0 in | Wt 182.0 lb

## 2023-10-21 DIAGNOSIS — R790 Abnormal level of blood mineral: Secondary | ICD-10-CM | POA: Diagnosis not present

## 2023-10-21 DIAGNOSIS — M2540 Effusion, unspecified joint: Secondary | ICD-10-CM

## 2023-10-21 DIAGNOSIS — M79601 Pain in right arm: Secondary | ICD-10-CM | POA: Diagnosis not present

## 2023-10-21 DIAGNOSIS — M79602 Pain in left arm: Secondary | ICD-10-CM | POA: Diagnosis not present

## 2023-10-21 LAB — COMPREHENSIVE METABOLIC PANEL WITH GFR
ALT: 17 U/L (ref 0–35)
AST: 17 U/L (ref 0–37)
Albumin: 4.1 g/dL (ref 3.5–5.2)
Alkaline Phosphatase: 119 U/L — ABNORMAL HIGH (ref 39–117)
BUN: 12 mg/dL (ref 6–23)
CO2: 30 meq/L (ref 19–32)
Calcium: 9.6 mg/dL (ref 8.4–10.5)
Chloride: 101 meq/L (ref 96–112)
Creatinine, Ser: 0.6 mg/dL (ref 0.40–1.20)
GFR: 110.02 mL/min (ref >60.00)
Glucose, Bld: 260 mg/dL — ABNORMAL HIGH (ref 70–99)
Potassium: 4.2 meq/L (ref 3.5–5.1)
Sodium: 137 meq/L (ref 135–145)
Total Bilirubin: 0.4 mg/dL (ref 0.2–1.2)
Total Protein: 7.2 g/dL (ref 6.0–8.3)

## 2023-10-21 LAB — CBC
HCT: 42.3 % (ref 36.0–46.0)
Hemoglobin: 14.3 g/dL (ref 12.0–15.0)
MCHC: 33.9 g/dL (ref 30.0–36.0)
MCV: 89.2 fl (ref 78.0–100.0)
Platelets: 345 K/uL (ref 150.0–400.0)
RBC: 4.74 Mil/uL (ref 3.87–5.11)
RDW: 12.9 % (ref 11.5–15.5)
WBC: 8.5 K/uL (ref 4.0–10.5)

## 2023-10-21 LAB — MAGNESIUM: Magnesium: 1.6 mg/dL (ref 1.5–2.5)

## 2023-10-21 MED ORDER — CYCLOBENZAPRINE HCL 5 MG PO TABS: 2.5000 mg | ORAL_TABLET | Freq: Every day | ORAL | 0 refills | Status: DC

## 2023-10-21 NOTE — Patient Instructions (Signed)
 Stop by the lab prior to leaving today. I will notify you of your results once received.   Start Flexeril 2.5 mg to 5 mg once daily at bedtime as needed for pain.   As discussed it can make you drowsy.  Follow up if symptoms worsen or do not improve.   It was a pleasure to see you today!

## 2023-10-21 NOTE — Progress Notes (Signed)
 Established Patient Office Visit  Subjective   Patient ID: Anita Massey, female    DOB: May 31, 1980  Age: 43 y.o. MRN: [id number]  Chief Complaint  Patient presents with   Arm Pain    Bilateral arm pain about 1 month ago. Patient has not been taking any pain meds for this. Patient states there has been some swelling in her arms. Patient denies any injuries to arms.     Arm Pain  Pertinent negatives include no chest pain.    Anita Massey is a 43 year old female, patient of Dr. Jimmy, presents today for an acute visit.   Discussed the use of AI scribe software for clinical note transcription with the patient, who gave verbal consent to proceed.  History of Present Illness Anita Massey is a 43 year old female who presents with bilateral upper arm pain. She is accompanied by her children.   She has been experiencing bilateral upper arm pain for over a month, affecting her ability to perform daily activities such as unfastening her bras and scratching her back. The pain is localized to the upper arms and does not radiate to her wrists, hands, or fingers. Occasionally, she experiences numbness or tingling, but it is not frequent. No trauma or specific incident has been identified as a cause of the pain.  She has not tried any medications for the arm pain and is not currently taking Celebrex  or tizanidine . She notes that her rings, which are usually loose, are now tight, indicating swelling in her fingers and upper arms.  Her family history is significant for psoriatic arthritis and fibromyalgia in her mother, and Parkinson's disease in both grandmothers.   During a recent ER visit, her potassium and magnesium  levels were noted to be low. She is concerned about persistent shaking.   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) 11/03/2019   Uterine scar from previous cesarean delivery affecting pregnancy 11/14/2018   Hypothyroidism 04/01/2018   Prothrombin gene mutation 03/31/2018   MTHFR gene mutation 03/31/2018   Preventative health care 01/21/2018   IBS (irritable bowel syndrome) 09/19/2017   Adrenal adenoma 09/11/2016   Panic anxiety syndrome 07/23/2016   Allergic urticaria    Rosacea 08/15/2015   Essential hypertension, benign 03/01/2013   Past Medical History:  Diagnosis Date   Anemia    Anxiety state, unspecified    Blood dyscrasia    PROTHROMBIN G [id number] HETEROZYGOSITY (FACTOR 2)   Clotting disorder    Diabetes mellitus without complication (HCC)    GERD (gastroesophageal reflux disease)    OCC   Homozygous for MTHFR gene mutation    Hypertension    Hypothyroidism    Multiple joint pain 09/04/2015   Obesity    Rosacea    Undiagnosed cardiac murmurs    Unspecified essential hypertension    Past Surgical History:  Procedure Laterality Date   APPENDECTOMY  2009   [facility]   CESAREAN SECTION  2016   CESAREAN SECTION N/A 11/15/2018   Procedure: REPEAT CESAREAN SECTION;  Surgeon: Victor Claudell JONELLE, MD;  Location: [facility] ORS;  Service: Obstetrics;  Laterality: N/A;  Female born @ 73 Apgars: 8/9 Weight: 6lbs 10 ozs   CHOLECYSTECTOMY N/A 03/20/2016   Procedure: LAPAROSCOPIC CHOLECYSTECTOMY;  Surgeon:  Diego JULIANNA Luna, MD;  Location: [facility] ORS;  Service: General;  Laterality: N/A;   DILATION AND CURETTAGE OF UTERUS     Allergies  Allergen Reactions   Biaxin [Clarithromycin] Hives   Metformin  And Related Nausea And Vomiting    Also bad tingling   Sulfa  Antibiotics Anaphylaxis   Toradol  [Ketorolac  Tromethamine ]     Chest pain    Prednisone  Anxiety and Palpitations    Makes me feel crazy   Morphine  And Codeine     Chest Pain    Tramadol  Other (See Comments)   Trulicity  [Dulaglutide ] Diarrhea     Bowel incontinence on high dose, tolerating 0.75 mg ok per patient   Tape Rash    Paper tape is fine         10/21/2023   12:16 PM 11/11/2022   10:42 AM 10/09/2022   10:25 AM  Depression screen PHQ 2/9  Decreased Interest 1 0 1  Down, Depressed, Hopeless 1 0 1  PHQ - 2 Score 2 0 2  Altered sleeping 1  1  Tired, decreased energy 1  3  Change in appetite 0  0  Feeling bad or failure about yourself  1  0  Trouble concentrating 0  0  Moving slowly or fidgety/restless 0  0  Suicidal thoughts 0  0  PHQ-9 Score 5  6  Difficult doing work/chores Somewhat difficult  Somewhat difficult       10/21/2023   12:16 PM 10/09/2022   10:25 AM  GAD 7 : Generalized Anxiety Score  Nervous, Anxious, on Edge 1 1  Control/stop worrying 1 1  Worry too much - different things 1 1  Trouble relaxing 0 1  Restless 0 0  Easily annoyed or irritable 1 0  Afraid - awful might happen 1 1  Total GAD 7 Score 5 5  Anxiety Difficulty Not difficult at all Somewhat difficult      Review of Systems  Constitutional:  Negative for chills and fever.  Respiratory:  Negative for shortness of breath.   Cardiovascular:  Negative for chest pain.  Gastrointestinal:  Negative for abdominal pain, constipation, diarrhea, heartburn, nausea and vomiting.  Genitourinary:  Negative for dysuria, frequency and urgency.  Neurological:  Negative for dizziness and headaches.  Endo/Heme/Allergies:  Negative for polydipsia.  Psychiatric/Behavioral:  Negative for depression and suicidal ideas. The patient is not nervous/anxious.       Objective:     BP 112/82   Pulse 97   Temp 98.7 F (37.1 C) (Oral)   Ht 5' 2 (1.575 m)   Wt 182 lb (82.6 kg)   SpO2 98%   BMI 33.29 kg/m  BP Readings from Last 3 Encounters:  10/21/23 112/82  09/04/23 122/70  09/01/23 118/80   Wt Readings from Last 3 Encounters:  10/21/23 182 lb (82.6 kg)  09/04/23 170 lb 3.2 oz (77.2 kg)  09/01/23 168 lb 3.2 oz (76.3 kg)      Physical  Exam Vitals and nursing note reviewed.  Constitutional:      Appearance: Normal appearance.  Cardiovascular:     Rate and Rhythm: Normal rate and regular rhythm.     Pulses: Normal pulses.     Heart sounds: Normal heart sounds.  Pulmonary:     Effort: Pulmonary effort is normal.     Breath sounds: Normal breath sounds.  Neurological:     Mental Status: She is alert and oriented to person, place, and time.  Psychiatric:  Mood and Affect: Mood normal.        Behavior: Behavior normal.        Thought Content: Thought content normal.        Judgment: Judgment normal.      No results found for any visits on 10/21/23.     The 10-year ASCVD risk score (Arnett DK, et al., 2019) is: 1.5%    Assessment & Plan:  Joint swelling -     ANA -     CBC -     Comprehensive metabolic panel with GFR  Bilateral arm pain -     Cyclobenzaprine HCl; Take 0.5-1 tablets (2.5-5 mg total) by mouth at bedtime.  Dispense: 30 tablet; Refill: 0  Low magnesium  level -     Magnesium     Assessment and Plan Assessment & Plan Bilateral upper arm pain and swelling Likely muscle strain or sprain. Differential includes muscle strain, unlikely fibromyalgia or psoriatic arthritis. - Prescribed Flexeril (cyclobenzaprine) at night. Start with half a tablet, increase to full tablet if needed. - labs pending. - Advised heat therapy on affected areas. - Advised follow-up if symptoms worsen or do not improve.  Back pain Intermittent with severe episodes causing movement difficulty. - Prescribed Flexeril (cyclobenzaprine) for management.  Electrolyte abnormality Hypokalemia and hypomagnesemia noted. Family history of potassium supplementation. - CMP and magnesium  pending.   Return if symptoms worsen or fail to improve.    Carrol Aurora, NP

## 2023-10-22 ENCOUNTER — Ambulatory Visit
Admission: RE | Admit: 2023-10-22 | Discharge: 2023-10-22 | Disposition: A | Discharge status: Home / Self Care | Source: Ambulatory Visit

## 2023-10-22 ENCOUNTER — Ambulatory Visit: Payer: Self-pay | Admitting: General Practice

## 2023-10-22 DIAGNOSIS — I10 Essential (primary) hypertension: Secondary | ICD-10-CM | POA: Diagnosis not present

## 2023-10-22 DIAGNOSIS — R079 Chest pain, unspecified: Secondary | ICD-10-CM | POA: Insufficient documentation

## 2023-10-22 DIAGNOSIS — R7689 Other specified abnormal immunological findings in serum: Secondary | ICD-10-CM

## 2023-10-22 LAB — ECHOCARDIOGRAM COMPLETE
AR max vel: 1.68 cm2
AV Area VTI: 1.7 cm2
AV Area mean vel: 1.66 cm2
AV Mean grad: 7.7 mmHg
AV Peak grad: 15.1 mmHg
Ao pk vel: 1.94 m/s
Area-P 1/2: 4.71 cm2
S' Lateral: 2.7 cm

## 2023-10-23 ENCOUNTER — Ambulatory Visit: Payer: Self-pay

## 2023-10-23 LAB — ANTI-NUCLEAR AB-TITER (ANA TITER)
ANA TITER: 1:80 {titer} — ABNORMAL HIGH
ANA Titer 1: 1:80 {titer} — ABNORMAL HIGH

## 2023-10-23 LAB — ANA: Anti Nuclear Antibody (ANA): POSITIVE — AB

## 2023-10-24 ENCOUNTER — Telehealth: Payer: Self-pay

## 2023-10-24 NOTE — Telephone Encounter (Signed)
 Pt would like a c/b to discuss Echo results as well as discussing when CT Morph can be done. Please advise

## 2023-10-29 DIAGNOSIS — H40053 Ocular hypertension, bilateral: Secondary | ICD-10-CM | POA: Diagnosis not present

## 2023-11-06 ENCOUNTER — Telehealth: Payer: Self-pay

## 2023-11-06 ENCOUNTER — Encounter: Payer: Self-pay | Admitting: *Deleted

## 2023-11-06 NOTE — Telephone Encounter (Signed)
 Copied from CRM [id number]. Topic: Referral - Status >> Nov 05, 2023  3:05 PM Sasha M wrote: Reason for CRM: Pt was seen a little over a week ago and was told that a referral was going to be in for rheumatology but has not heard yet. Please call pt to advise at [phone]

## 2023-11-06 NOTE — Telephone Encounter (Signed)
 Referral faxed to [facility].   [facility] - [facility] [address] [place], [place] [address] Office: [phone]   Patient was also sent a MyChart letter and MyChart message with referral and scheduling instructions.

## 2023-11-06 NOTE — Telephone Encounter (Signed)
Please check on rheumatology referral.

## 2023-11-07 NOTE — Telephone Encounter (Signed)
 Tried to call patient but no answer and could not leave VM.

## 2023-11-13 ENCOUNTER — Ambulatory Visit (INDEPENDENT_AMBULATORY_CARE_PROVIDER_SITE_OTHER)
Admission: RE | Admit: 2023-11-13 | Discharge: 2023-11-13 | Disposition: A | Discharge status: Home / Self Care | Source: Ambulatory Visit | Attending: Family | Admitting: Family

## 2023-11-13 ENCOUNTER — Ambulatory Visit: Payer: Self-pay | Admitting: Family

## 2023-11-13 ENCOUNTER — Encounter (HOSPITAL_COMMUNITY): Payer: Self-pay

## 2023-11-13 ENCOUNTER — Ambulatory Visit: Payer: Medicaid Other | Admitting: Family

## 2023-11-13 ENCOUNTER — Encounter: Payer: Self-pay | Admitting: Family

## 2023-11-13 VITALS — BP 124/70 | HR 76 | Temp 98.4°F | Ht 62.0 in | Wt 186.2 lb

## 2023-11-13 DIAGNOSIS — M79601 Pain in right arm: Secondary | ICD-10-CM

## 2023-11-13 DIAGNOSIS — T782XXA Anaphylactic shock, unspecified, initial encounter: Secondary | ICD-10-CM | POA: Diagnosis not present

## 2023-11-13 DIAGNOSIS — T63444A Toxic effect of venom of bees, undetermined, initial encounter: Secondary | ICD-10-CM | POA: Diagnosis not present

## 2023-11-13 DIAGNOSIS — M5412 Radiculopathy, cervical region: Secondary | ICD-10-CM

## 2023-11-13 DIAGNOSIS — M79602 Pain in left arm: Secondary | ICD-10-CM

## 2023-11-13 DIAGNOSIS — M47812 Spondylosis without myelopathy or radiculopathy, cervical region: Secondary | ICD-10-CM

## 2023-11-13 DIAGNOSIS — R251 Tremor, unspecified: Secondary | ICD-10-CM

## 2023-11-13 DIAGNOSIS — Z975 Presence of (intrauterine) contraceptive device: Secondary | ICD-10-CM

## 2023-11-13 DIAGNOSIS — Z8639 Personal history of other endocrine, nutritional and metabolic disease: Secondary | ICD-10-CM

## 2023-11-13 DIAGNOSIS — R748 Abnormal levels of other serum enzymes: Secondary | ICD-10-CM

## 2023-11-13 DIAGNOSIS — E1042 Type 1 diabetes mellitus with diabetic polyneuropathy: Secondary | ICD-10-CM | POA: Diagnosis not present

## 2023-11-13 DIAGNOSIS — Z1589 Genetic susceptibility to other disease: Secondary | ICD-10-CM

## 2023-11-13 DIAGNOSIS — E039 Hypothyroidism, unspecified: Secondary | ICD-10-CM

## 2023-11-13 DIAGNOSIS — E1159 Type 2 diabetes mellitus with other circulatory complications: Secondary | ICD-10-CM

## 2023-11-13 DIAGNOSIS — Z13220 Encounter for screening for lipoid disorders: Secondary | ICD-10-CM

## 2023-11-13 DIAGNOSIS — I10 Essential (primary) hypertension: Secondary | ICD-10-CM

## 2023-11-13 DIAGNOSIS — E66811 Obesity, class 1: Secondary | ICD-10-CM

## 2023-11-13 DIAGNOSIS — Z820 Family history of epilepsy and other diseases of the nervous system: Secondary | ICD-10-CM

## 2023-11-13 DIAGNOSIS — M542 Cervicalgia: Secondary | ICD-10-CM | POA: Diagnosis not present

## 2023-11-13 LAB — HEPATIC FUNCTION PANEL
ALT: 16 U/L (ref 0–35)
AST: 15 U/L (ref 0–37)
Albumin: 4.2 g/dL (ref 3.5–5.2)
Alkaline Phosphatase: 139 U/L — ABNORMAL HIGH (ref 39–117)
Bilirubin, Direct: 0.1 mg/dL (ref 0.0–0.3)
Total Bilirubin: 0.4 mg/dL (ref 0.2–1.2)
Total Protein: 7.1 g/dL (ref 6.0–8.3)

## 2023-11-13 LAB — LIPID PANEL
Cholesterol: 258 mg/dL — ABNORMAL HIGH (ref 0–200)
HDL: 44.8 mg/dL (ref >39.00)
LDL Cholesterol: 161 mg/dL — ABNORMAL HIGH (ref 0–99)
NonHDL: 213.48
Total CHOL/HDL Ratio: 6
Triglycerides: 262 mg/dL — ABNORMAL HIGH (ref 0.0–149.0)
VLDL: 52.4 mg/dL — ABNORMAL HIGH (ref 0.0–40.0)

## 2023-11-13 LAB — VITAMIN B12: Vitamin B-12: 244 pg/mL (ref 211–911)

## 2023-11-13 LAB — FOLATE: Folate: 10.1 ng/mL (ref >5.9)

## 2023-11-13 LAB — TSH: TSH: 1.29 u[IU]/mL (ref 0.35–5.50)

## 2023-11-13 LAB — T3, FREE: T3, Free: 3.5 pg/mL (ref 2.3–4.2)

## 2023-11-13 LAB — GLUCOSE, POCT (MANUAL RESULT ENTRY): POC Glucose: 109 mg/dL — AB (ref 70–99)

## 2023-11-13 LAB — T4, FREE: Free T4: 0.61 ng/dL (ref 0.60–1.60)

## 2023-11-13 MED ORDER — EPINEPHRINE 0.3 MG/0.3ML IJ SOAJ
0.3000 mg | INTRAMUSCULAR | 0 refills | Status: AC | PRN
Start: 2023-11-13 — End: ?

## 2023-11-13 MED ORDER — BLOOD GLUCOSE TEST VI STRP: 1.0000 | ORAL_STRIP | Freq: Three times a day (TID) | 5 refills | Status: AC

## 2023-11-13 MED ORDER — CYCLOBENZAPRINE HCL 5 MG PO TABS
2.5000 mg | ORAL_TABLET | Freq: Every day | ORAL | 0 refills | Status: AC
Start: 2023-11-13 — End: ?

## 2023-11-13 MED ORDER — LANCET DEVICE MISC: 1.0000 | Freq: Three times a day (TID) | 0 refills | Status: AC

## 2023-11-13 MED ORDER — LANCETS MISC
1.0000 | 0 refills | Status: AC
Start: 2023-11-13 — End: ?

## 2023-11-13 MED ORDER — BLOOD GLUCOSE MONITORING SUPPL DEVI
1.0000 | Freq: Three times a day (TID) | 0 refills | Status: AC
Start: 2023-11-13 — End: ?

## 2023-11-13 NOTE — Progress Notes (Signed)
 Established Patient Office Visit  Subjective:      CC:  Chief Complaint  Patient presents with   Establish Care    HPI: Anita Massey is a 43 y.o. female presenting on 11/13/2023 for Establish Care .  Discussed the use of AI scribe software for clinical note transcription with the patient, who gave verbal consent to proceed.  History of Present Illness Anita Massey is a 43 year old female with type 1 diabetes and psoriatic arthritis who presents with bilateral upper arm pain and concerns about glucose monitoring accuracy.  She has been experiencing bilateral upper arm pain for several months, which interferes with daily activities such as unfastening her bra and scratching her back. The pain is aggravated by movement. Swelling in her arms makes it difficult to wear rings. She has tried Flexeril and lidocaine  patches, both of which provide some relief. There is no known trauma associated with the onset of the pain.  She has type 1 diabetes and uses a continuous glucose monitor (Dexcom) and an insulin  pump (Islet). She reports discrepancies between her glucose monitor readings and finger stick readings, with the monitor often showing lower values. Recently, she experienced a significant drop in blood sugar, waking her at night, and required food intake to stabilize it. She is concerned about the accuracy of her glucose readings and the potential impact on her diabetes management.  She experiences constant tremors in her hands, which worsen with use. There is a family history of Parkinson's disease on both her maternal and paternal sides, raising concerns about her symptoms.  She reports a history of thyroid  issues during pregnancy, leading to hair loss and fatigue. She is concerned about rapid weight gain and questions whether her thyroid  function has normalized post-pregnancy. She has not been on thyroid  medication since her pregnancy.  She experiences ovary pain described as  shooting and immobilizing, though she has not had a period due to an IUD in place for over five years. She has not seen a gynecologist since her last pregnancy.         Social history:  Relevant past medical, surgical, family and social history reviewed and updated as indicated. Interim medical history since our last visit reviewed.  Allergies and medications reviewed and updated.  DATA REVIEWED: CHART IN EPIC     ROS: Negative unless specifically indicated above in HPI.    Current Outpatient Medications:    Accu-Chek Softclix Lancets lancets, Use to check blood sugar 3-4 times daily, Disp: 100 each, Rfl: 12   ALPRAZolam  (XANAX ) 0.25 MG tablet, Take 1 tablet (0.25 mg total) by mouth 2 (two) times daily as needed for anxiety., Disp: 30 tablet, Rfl: 0   amLODipine  (NORVASC ) 10 MG tablet, Take 1 tablet (10 mg total) by mouth daily., Disp: 90 tablet, Rfl: 0   aspirin  EC 81 MG tablet, Take 1 tablet (81 mg total) by mouth daily. Swallow whole., Disp: , Rfl:    atorvastatin  (LIPITOR) 80 MG tablet, Take 1 tablet (80 mg total) by mouth daily., Disp: 90 tablet, Rfl: 3   Blood Glucose Monitoring Suppl DEVI, 1 each by Does not apply route in the morning, at noon, and at bedtime. May substitute to any manufacturer covered by patient's insurance., Disp: 1 each, Rfl: 0   Continuous Glucose Sensor (DEXCOM G7 SENSOR) MISC, Use to monitor glucose continuously change every 10 days, Disp: 6 each, Rfl: 1   dorzolamide-timolol (COSOPT) 2-0.5 % ophthalmic solution, 1 drop 2 (two) times  daily., Disp: , Rfl:    fluticasone  (FLONASE ) 50 MCG/ACT nasal spray, Place 2 sprays into both nostrils daily., Disp: 16 g, Rfl: 0   Glucose Blood (BLOOD GLUCOSE TEST STRIPS) STRP, 1 each by In Vitro route in the morning, at noon, and at bedtime. May substitute to any manufacturer covered by patient's insurance., Disp: 100 strip, Rfl: 5   Insulin  Aspart, w/Niacinamide, (FIASP  PUMPCART) 100 UNIT/ML SOCT, Use up to 130 units  in the insulin  pump daily, Disp: 90 mL, Rfl: 3   Insulin  Aspart, w/Niacinamide, (FIASP ) 100 UNIT/ML SOLN, Use up to 100 units in the insulin  pump daily, Disp: 90 mL, Rfl: 3   insulin  glargine (LANTUS ) 100 UNIT/ML injection, Inject 0.3 mLs (30 Units total) into the skin 2 (two) times daily., Disp: 30 mL, Rfl: 11   Insulin  Pen Needle 32G X 4 MM MISC, Use 4x a day, Disp: 300 each, Rfl: 3   ketoconazole  (NIZORAL ) 2 % cream, Apply 1 application topically in the morning and at bedtime., Disp: 60 g, Rfl: 5   Lancet Device MISC, 1 each by Does not apply route in the morning, at noon, and at bedtime. May substitute to any manufacturer covered by patient's insurance., Disp: 1 each, Rfl: 0   Lancets MISC, 1 each by Does not apply route as directed. Dispense based on patient and insurance preference. Use up to four times daily as directed. (FOR ICD-10 E10.9, E11.9)., Disp: 100 each, Rfl: 0   metoprolol  tartrate (LOPRESSOR ) 100 MG tablet, TAKE 1 TABLET 2 HR PRIOR TO CARDIAC PROCEDURE, Disp: 1 tablet, Rfl: 0   nystatin  (MYCOSTATIN /NYSTOP ) powder, Apply 1 Application topically 3 (three) times daily., Disp: 15 g, Rfl: 0   ondansetron  (ZOFRAN  ODT) 4 MG disintegrating tablet, Take 1 tablet (4 mg total) by mouth every 8 (eight) hours as needed for nausea or vomiting., Disp: 20 tablet, Rfl: 0   Semaglutide , 1 MG/DOSE, (OZEMPIC , 1 MG/DOSE,) 4 MG/3ML SOPN, Inject 1 mg into the skin once a week., Disp: 9 mL, Rfl: 3   sertraline  (ZOLOFT ) 100 MG tablet, Take 2 tablets (200 mg total) by mouth daily., Disp: 180 tablet, Rfl: 3   terconazole  (TERAZOL 7 ) 0.4 % vaginal cream, Place 1 applicator vaginally at bedtime., Disp: 45 g, Rfl: 0   valACYclovir  (VALTREX ) 1000 MG tablet, Take 2 tablets (2,000 mg total) by mouth daily. And repeat once in 12 hours---for cold sore, Disp: 20 tablet, Rfl: 0   cyclobenzaprine (FLEXERIL) 5 MG tablet, Take 0.5-1 tablets (2.5-5 mg total) by mouth at bedtime., Disp: 30 tablet, Rfl: 0   EPINEPHrine  0.3  mg/0.3 mL IJ SOAJ injection, Inject 0.3 mg into the muscle as needed for anaphylaxis., Disp: 2 each, Rfl: 0        Objective:        BP 124/70 (BP Location: Right Arm, Patient Position: Sitting, Cuff Size: Large)   Pulse 76   Temp 98.4 F (36.9 C) (Temporal)   Ht 5' 2 (1.575 m)   Wt 186 lb 3.2 oz (84.5 kg)   SpO2 97%   Breastfeeding No   BMI 34.06 kg/m   Physical Exam MUSCULOSKELETAL: Pain in upper arms with movement. Neck pain radiating down both sides. Tenderness at the base of the neck.  Wt Readings from Last 3 Encounters:  11/13/23 186 lb 3.2 oz (84.5 kg)  10/21/23 182 lb (82.6 kg)  09/04/23 170 lb 3.2 oz (77.2 kg)    Physical Exam Vitals reviewed.  Constitutional:      General:  She is not in acute distress.    Appearance: Normal appearance. She is normal weight. She is not ill-appearing, toxic-appearing or diaphoretic.  HENT:     Head: Normocephalic.  Cardiovascular:     Rate and Rhythm: Normal rate.  Pulmonary:     Effort: Pulmonary effort is normal.  Musculoskeletal:        General: Normal range of motion.  Neurological:     General: No focal deficit present.     Mental Status: She is alert and oriented to person, place, and time. Mental status is at baseline.  Psychiatric:        Mood and Affect: Mood normal.        Behavior: Behavior normal.        Thought Content: Thought content normal.        Judgment: Judgment normal.          Results LABS Autoimmune antibody panel: Slightly elevated Alkaline phosphatase: Slightly elevated Continuous glucose monitor: 92 Fingerstick glucose: 109  Assessment & Plan:   Assessment and Plan Assessment & Plan Bilateral upper arm pain and weakness, possible cervical radiculopathy Bilateral upper arm pain and weakness for several months, affecting daily activities. Pain is aggravated by movement and associated with swelling. Possible cervical radiculopathy. Differential includes muscular tension and  fibromyalgia. - Order neck x-ray - Prescribe lidocaine  patches for pain relief - Recommend use of heating pad - Advise Tylenol  as needed for pain - Provide neck exercises to maintain flexibility  Tremor of both hands Persistent tremor in both hands, exacerbated by use. Family history of Parkinson's disease. Differential includes essential tremor and Parkinson's disease. - Refer to neurology for evaluation and EMG  Type 1 diabetes mellitus Type 1 diabetes with recent hypoglycemia and discrepancies between CGM and glucometer readings. Currently using an insulin  pump and Dexcom sensor. Concerns about CGM accuracy and recent cessation of Ozempic  due to side effects and external advice. Informed consent discussion highlighted the importance of Ozempic  for insulin  control despite side effects, emphasizing the benefit-risk scenario. - Order glucometer and strips for backup glucose monitoring - Advise contacting endocrinologist for management of insulin  and Ozempic  - Recommend discussing recent hypoglycemic episodes with endocrinologist  Essential hypertension Hypertension managed with amlodipine .  Fibromyalgia Diagnosis of fibromyalgia with significant pain in upper arms. Pain management includes Flexeril, which has been somewhat effective. Consideration of anxiety and depression as contributing factors. - Refill Flexeril for muscle relaxation - Consider duloxetine  (Cymbalta ) for fibromyalgia and depression if current management is insufficient  Depression and anxiety disorder Depression and anxiety managed with sertraline  (Zoloft ) at 200 mg. Symptoms of depression persist, possibly affecting fibromyalgia. Discussion of potential medication adjustment if current treatment is inadequate. 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 No current EpiPen  available. -  Prescribe EpiPen  for emergency use  History of total hysterectomy for cervical dysplasia Total hysterectomy due to cervical dysplasia. No current gynecological follow-up since the procedure. - Refer to gynecologist for routine follow-up and IUD management  Presence of intrauterine contraceptive device (IUD) IUD in place for contraception. No recent gynecological follow-up. - Refer to gynecologist for IUD check and routine gynecological care       Return in about 4 weeks (around 12/11/2023).     Ginger Patrick, MSN, APRN, FNP-C [facility]

## 2023-11-17 ENCOUNTER — Ambulatory Visit
Admission: RE | Admit: 2023-11-17 | Discharge: 2023-11-17 | Disposition: A | Discharge status: Home / Self Care | Source: Ambulatory Visit

## 2023-11-17 DIAGNOSIS — R072 Precordial pain: Secondary | ICD-10-CM | POA: Diagnosis not present

## 2023-11-17 MED ORDER — METOPROLOL TARTRATE 5 MG/5ML IV SOLN: 10.0000 mg | Freq: Once | INTRAVENOUS | Status: DC | PRN

## 2023-11-17 MED ORDER — NITROGLYCERIN 0.4 MG SL SUBL
0.8000 mg | SUBLINGUAL_TABLET | Freq: Once | SUBLINGUAL | Status: AC
  Administered 2023-11-17: 0.8 mg via SUBLINGUAL
  Filled 2023-11-17: qty 25

## 2023-11-17 MED ORDER — IOHEXOL 350 MG/ML SOLN
100.0000 mL | Freq: Once | INTRAVENOUS | Status: AC | PRN
  Administered 2023-11-17: 100 mL via INTRAVENOUS

## 2023-11-17 MED ORDER — DILTIAZEM HCL 25 MG/5ML IV SOLN: 10.0000 mg | INTRAVENOUS | Status: DC | PRN

## 2023-11-19 DIAGNOSIS — E1165 Type 2 diabetes mellitus with hyperglycemia: Secondary | ICD-10-CM | POA: Diagnosis not present

## 2023-11-19 DIAGNOSIS — E1142 Type 2 diabetes mellitus with diabetic polyneuropathy: Secondary | ICD-10-CM | POA: Diagnosis not present

## 2023-11-20 ENCOUNTER — Other Ambulatory Visit: Payer: Self-pay | Admitting: *Deleted

## 2023-11-20 DIAGNOSIS — E1069 Type 1 diabetes mellitus with other specified complication: Secondary | ICD-10-CM

## 2023-11-21 ENCOUNTER — Telehealth: Payer: Self-pay

## 2023-11-21 ENCOUNTER — Other Ambulatory Visit

## 2023-11-21 NOTE — Telephone Encounter (Signed)
 Spoke to pt informed her that Dr Trixie said that she hopes that pt blood sugars remain around this value (106) that blood sugar reads are excellent. Also informed pt to continue to take Ozempic .

## 2023-11-21 NOTE — Telephone Encounter (Signed)
 Pt called to report that she had stopped taking her atorvastatin  and ozempic  x1 mo, but has since started back after seeing her cardiologist. Pt report that her cardiologist increased her atorvastatin  to 80 mg. Pt feels likes that 80  mg is too much to start back on. Please advise.

## 2023-11-24 MED ORDER — GABAPENTIN 300 MG PO CAPS: 300.0000 mg | ORAL_CAPSULE | Freq: Three times a day (TID) | ORAL | 0 refills | Status: DC

## 2023-11-24 NOTE — Addendum Note (Signed)
 Addended by: CORWIN ANTU on: 11/24/2023 03:00 PM   Modules accepted: Orders

## 2023-12-09 NOTE — Progress Notes (Unsigned)
 Referring Physician:  Corwin Antu, FNP [address] [address],  [place] [address]  Primary Physician:  Corwin Antu, FNP  History of Present Illness: 12/22/2023 [name] has a history of HTN, IBS, adrenal adenoma, hypothyroidism, DM, depression, panic anxiety syndrome, clotting disorder (MTHFR gene mutation).   She has constant neck pain that radiates into both arms to above her elbows x 6 months. Neck pain < arm pain. Left arm pain > right arm pain. She has intermittent tingling in both hands. She has weakness  in both arms (hard to open jars). Some relief with neurontin  and ice. Pain is worse with cold weather. Pain is also worse with using her arms. No known injury.   Her primary pain is left > right shoulder pain. Cannot wash her hair or put on her bra due to pain.   She notes family history of parkinsons and she has bilateral hand tremors. Has not seen neurology for this- has appointment in January with Dr. Evonnie, but does not think she can get to [facility]. Would like to see Dr. Maree here in [place].   She saw [facility] last week for her shoulders. No injections done. She thinks he is ordering an MRI of her shoulders.   Tobacco use: Does not smoke.   Bowel/Bladder Dysfunction: none, some bowel urgency x years  Conservative measures:  Physical therapy: has not participated in Multimodal medical therapy including regular antiinflammatories:  Tylenol , Gabapentin , Flexeril , Lidocaine  patches Injections: no epidural steroid injections  Past Surgery: no spine surgery  Anita Massey has no symptoms of cervical myelopathy.  The symptoms are causing a significant impact on the patient's life.   Review of Systems:  A 10 point review of systems is negative, except for the pertinent positives and negatives detailed in the HPI.  Past Medical History: Past Medical History:  Diagnosis Date   Anemia    Anxiety state, unspecified    Blood dyscrasia     PROTHROMBIN G 79789 HETEROZYGOSITY (FACTOR 2)   Clotting disorder    Diabetes mellitus without complication (HCC)    GERD (gastroesophageal reflux disease)    OCC   Homozygous for MTHFR gene mutation    Hypertension    Hypothyroidism    Multiple joint pain 09/04/2015   Obesity    Rosacea    Undiagnosed cardiac murmurs    Unspecified essential hypertension     Past Surgical History: Past Surgical History:  Procedure Laterality Date   APPENDECTOMY  2009   [facility]   CESAREAN SECTION  2016   CESAREAN SECTION N/A 11/15/2018   Procedure: REPEAT CESAREAN SECTION;  Surgeon: Victor Claudell JONELLE, MD;  Location: [facility] ORS;  Service: Obstetrics;  Laterality: N/A;  Female born @ 39 Apgars: 8/9 Weight: 6lbs 10 ozs   CHOLECYSTECTOMY N/A 03/20/2016   Procedure: LAPAROSCOPIC CHOLECYSTECTOMY;  Surgeon: Laneta JULIANNA Markas Aldredge, MD;  Location: [facility] ORS;  Service: General;  Laterality: N/A;   DILATION AND CURETTAGE OF UTERUS      Allergies: Allergies as of 12/22/2023 - Review Complete 12/22/2023  Allergen Reaction Noted   Bee venom Anaphylaxis 11/13/2023   Biaxin [clarithromycin] Hives 01/12/2016   Metformin  and related Nausea And Vomiting 11/12/2019   Sulfa  antibiotics Anaphylaxis 08/20/2023   Toradol  [ketorolac  tromethamine ]  02/17/2016   Prednisone  Anxiety and Palpitations 12/01/2016   Morphine  and codeine  02/17/2016   Tramadol  Other (See Comments) 08/20/2023   Trulicity  [dulaglutide ] Diarrhea 03/15/2021   Tape Rash 03/11/2016    Medications: Outpatient  Encounter Medications as of 12/22/2023  Medication Sig   Accu-Chek Softclix Lancets lancets Use to check blood sugar 3-4 times daily   ALPRAZolam  (XANAX ) 0.25 MG tablet Take 1 tablet (0.25 mg total) by mouth 2 (two) times daily as needed for anxiety.   amLODipine  (NORVASC ) 10 MG tablet Take 1 tablet (10 mg total) by mouth daily.   aspirin  EC 81 MG tablet Take 1 tablet (81 mg total) by mouth daily. Swallow whole.   atorvastatin  (LIPITOR) 80 MG tablet  Take 1 tablet (80 mg total) by mouth daily.   Blood Glucose Monitoring Suppl DEVI 1 each by Does not apply route in the morning, at noon, and at bedtime. May substitute to any manufacturer covered by patient's insurance.   Continuous Glucose Sensor (DEXCOM G7 SENSOR) MISC Use to monitor glucose continuously change every 10 days   cyclobenzaprine  (FLEXERIL ) 5 MG tablet Take 0.5-1 tablets (2.5-5 mg total) by mouth at bedtime.   dorzolamide-timolol (COSOPT) 2-0.5 % ophthalmic solution 1 drop 2 (two) times daily.   EPINEPHrine  0.3 mg/0.3 mL IJ SOAJ injection Inject 0.3 mg into the muscle as needed for anaphylaxis.   fluticasone  (FLONASE ) 50 MCG/ACT nasal spray Place 2 sprays into both nostrils daily.   gabapentin  (NEURONTIN ) 300 MG capsule Take 1 capsule (300 mg total) by mouth 3 (three) times daily.   Glucose Blood (BLOOD GLUCOSE TEST STRIPS) STRP 1 each by In Vitro route in the morning, at noon, and at bedtime. May substitute to any manufacturer covered by patient's insurance.   Insulin  Aspart, w/Niacinamide, (FIASP  PUMPCART) 100 UNIT/ML SOCT Use up to 130 units in the insulin  pump daily   Insulin  Aspart, w/Niacinamide, (FIASP ) 100 UNIT/ML SOLN Use up to 100 units in the insulin  pump daily   insulin  glargine (LANTUS ) 100 UNIT/ML injection Inject 0.3 mLs (30 Units total) into the skin 2 (two) times daily.   Insulin  Pen Needle 32G X 4 MM MISC Use 4x a day   ketoconazole  (NIZORAL ) 2 % cream Apply 1 application topically in the morning and at bedtime.   Lancet Device MISC 1 each by Does not apply route in the morning, at noon, and at bedtime. May substitute to any manufacturer covered by patient's insurance.   Lancets MISC 1 each by Does not apply route as directed. Dispense based on patient and insurance preference. Use up to four times daily as directed. (FOR ICD-10 E10.9, E11.9).   levonorgestrel  (MIRENA ) 20 MCG/DAY IUD 1 each by Intrauterine route once.   nystatin  (MYCOSTATIN /NYSTOP ) powder Apply 1  Application topically 3 (three) times daily.   ondansetron  (ZOFRAN  ODT) 4 MG disintegrating tablet Take 1 tablet (4 mg total) by mouth every 8 (eight) hours as needed for nausea or vomiting.   Semaglutide , 1 MG/DOSE, (OZEMPIC , 1 MG/DOSE,) 4 MG/3ML SOPN Inject 1 mg into the skin once a week.   sertraline  (ZOLOFT ) 100 MG tablet Take 2 tablets (200 mg total) by mouth daily.   valACYclovir  (VALTREX ) 1000 MG tablet Take 2 tablets (2,000 mg total) by mouth daily. And repeat once in 12 hours---for cold sore   [DISCONTINUED] metoprolol  tartrate (LOPRESSOR ) 100 MG tablet TAKE 1 TABLET 2 HR PRIOR TO CARDIAC PROCEDURE   [DISCONTINUED] terconazole  (TERAZOL 7 ) 0.4 % vaginal cream Place 1 applicator vaginally at bedtime.   No facility-administered encounter medications on file as of 12/22/2023.    Social History: Social History   Tobacco Use   Smoking status: Former    Current packs/day: 0.00    Average packs/day: 0.3  packs/day for 1 year (0.3 ttl pk-yrs)    Types: Cigarettes    Start date: 03/13/1997    Quit date: 03/13/1998    Years since quitting: 25.7   Smokeless tobacco: Never  Vaping Use   Vaping status: Never Used  Substance Use Topics   Alcohol use: No   Drug use: No    Family Medical History: Family History  Problem Relation Age of Onset   Mitral valve prolapse Mother    Arrhythmia Mother    Hypertension Mother    Diabetes Mother    Arthritis Mother        psoriatic   Fibromyalgia Mother    Hypertension Father    Hyperlipidemia Father    Diabetes Brother    Ovarian cancer Maternal Grandmother    Diabetes Maternal Grandmother    Melanoma Maternal Grandmother    Parkinson's disease Maternal Grandmother    Heart disease Maternal Grandfather    Lung cancer Maternal Grandfather    Cancer Maternal Grandfather    Parkinson's disease Paternal Grandmother    Heart disease Maternal Aunt    Diabetes Maternal Aunt    Depression Maternal Aunt    Diabetes Maternal Uncle      Physical Examination: Vitals:   12/22/23 1019  BP: 130/84    General: Patient is well developed, well nourished, calm, collected, and in no apparent distress. Attention to examination is appropriate.  Respiratory: Patient is breathing without any difficulty.   NEUROLOGICAL:     Awake, alert, oriented to person, place, and time.  Speech is clear and fluent. Fund of knowledge is appropriate.   Cranial Nerves: Pupils equal round and reactive to light.  Facial tone is symmetric.    diffuse posterior cervical tenderness. Diffuse tenderness in bilateral trapezial region.   She has limited ROM of both shoulders with significant pain. She has pain with IR/ER of both shoulders.   She has intention tremor in right > left hand.   No abnormal lesions on exposed skin.   Strength: Side Biceps Triceps Deltoid Interossei Grip Wrist Ext. Wrist Flex.  R 5 5 5 5 5 5 5   L 5 5 5 5 5 5 5    Side Iliopsoas Quads Hamstring PF DF EHL  R 5 5 5 5 5 5   L 5 5 5 5 5 5    Reflexes are 2+ and symmetric at the biceps, brachioradialis, patella and achilles.   Hoffman's is absent.  Clonus is not present.   Bilateral upper and lower extremity sensation is intact to light touch.     No pain with IR/ER of both hips.   Gait is normal.    Medical Decision Making  Imaging: Cervical xrays 11/13/23:  FINDINGS:   BONES: No acute fracture. No aggressive appearing osseous lesion. Alignment is normal.   DISCS AND DEGENERATIVE CHANGES: Mild multilevel spondylosis and facet arthropathy. Bony neural foraminal narrowing on the left at C2-C3 and C3-C4 and on the right at C3-C4 and C4-C5.   SOFT TISSUES: No prevertebral soft tissue swelling. The visualized lungs appear clear.   IMPRESSION: 1. No acute abnormality of the cervical spine.   Electronically signed by: Norman Gatlin MD 11/13/2023 01:39 PM EDT RP Workstation: [id number]  I have personally reviewed the images and agree with the above  interpretation.  Assessment and Plan: Ms. Telleria has 6 month history of constant neck pain that radiates into both arms to above her elbows. Neck pain < arm pain. Left arm pain > right arm pain. She  has intermittent tingling in both hands. She has weakness  in both arms (hard to open jars). No known injury.   Her primary pain is left > right shoulder pain. Cannot wash her hair or put on her bra due to pain.   She has known cervical spondylosis.   Treatment options discussed with patient and following plan made:   - MRI of cervical spine to further evaluate neck and bilateral arm pain.  - As above, I think a lot of her pain is shoulder mediated. Will get records from [facility].  - She will follow up with rheumatology as scheduled on 01/05/24.  - She has bilateral hand tremor. Has appointment to see neurology ([facility]) in January, but wants to stay in [place]. Referral to Dr. Maree.  - Will schedule phone or MyChart visit to review MRI results once I get them back.   I spent a total of 35 minutes in face-to-face and non-face-to-face activities related to this patient's care today including review of outside records, review of imaging, review of symptoms, physical exam, discussion of differential diagnosis, discussion of treatment options, and documentation.   Thank you for involving me in the care of this patient.   Glade Boys PA-C Dept. of Neurosurgery

## 2023-12-10 DIAGNOSIS — J069 Acute upper respiratory infection, unspecified: Secondary | ICD-10-CM | POA: Diagnosis not present

## 2023-12-10 DIAGNOSIS — R058 Other specified cough: Secondary | ICD-10-CM | POA: Diagnosis not present

## 2023-12-16 ENCOUNTER — Ambulatory Visit: Payer: Self-pay

## 2023-12-16 NOTE — Telephone Encounter (Signed)
 Agent unable to transfer call - called pt back. Call answered and I was instructed to enter my mailbox number. Unable to lm. Will place in call backs.     Copied from CRM #[id number]. Topic: Clinical - Red Word Triage >> Dec 16, 2023  2:06 PM Sasha M wrote: Red Word that prompted transfer to Nurse Triage: severe pain in left shoulder and having swelling as well, pt is crying as she has dealt with pain for a long time now but the shoulder is getting worse

## 2023-12-16 NOTE — Telephone Encounter (Deleted)
   Reason for Disposition . [1] SEVERE pain (e.g., excruciating) AND [2] not improved 2 hours after pain medicine  Protocols used: Arm Pain-A-AH

## 2023-12-16 NOTE — Telephone Encounter (Signed)
 Attempted to contact pt but I received a message asking for a voicemail box number. Will try to call her back.

## 2023-12-16 NOTE — Telephone Encounter (Addendum)
 Patient has log standing pain in neck/shoulder/upper arm for months- patient has referrals for this- but she is having severe pain that is not controled by gabapentin , Patient states she uses 600-900 mg/day.Patient would like to have appointment with provider or have something prescribed for pain.  FYI Only or Action Required?: Action required by provider: request for appointment and medication request.  Patient was last seen in primary care on 11/13/2023 by Corwin Antu, FNP.  Called Nurse Triage reporting Arm Pain.  Symptoms began several months ago.  Interventions attempted: Prescription medications: gabapentin .  Symptoms are: gradually worsening.  Triage Disposition: Go to ED Now (or PCP Triage)  Patient/caregiver understands and will follow disposition?: No, refuses disposition  Answer Assessment - Initial Assessment Questions 1. ONSET: When did the pain start?     Months- severe pain over 1 month 2. LOCATION: Where is the pain located?     Left is swollen- bigger than R- shoulder/upper arm- that is were swelling is located. Patient does feel she has swelling in her fingers too- unable to get rings on, R arm also hurts- but not as bad as the L  3. PAIN: How bad is the pain? (Scale 0-10; or none, mild, moderate, severe)     8/10- gabapentin   4. WORK OR EXERCISE: Has there been any recent work or exercise that involved this part of the body?     no 5. CAUSE: What do you think is causing the arm pain?     Neck disc narrowing- patient does have referral to neuro 6. OTHER SYMPTOMS: Do you have any other symptoms? (e.g., neck pain, swelling, rash, fever, numbness, weakness)     Neck pain, weakness- both arms, hard to open lids  Protocols used: Arm Pain-A-AH

## 2023-12-17 DIAGNOSIS — M19012 Primary osteoarthritis, left shoulder: Secondary | ICD-10-CM | POA: Diagnosis not present

## 2023-12-17 DIAGNOSIS — G8929 Other chronic pain: Secondary | ICD-10-CM | POA: Diagnosis not present

## 2023-12-17 DIAGNOSIS — M7989 Other specified soft tissue disorders: Secondary | ICD-10-CM | POA: Diagnosis not present

## 2023-12-17 DIAGNOSIS — M19011 Primary osteoarthritis, right shoulder: Secondary | ICD-10-CM | POA: Diagnosis not present

## 2023-12-17 NOTE — Telephone Encounter (Signed)
 Pt needs to go to ER and or urgent care there is worry for blood clot or other issue, swelling does not typically accompany bone issues in the shoulder into the hand.   [name] believes it is more orthopedic [facility] has an urgent care however she really needs the swelling assessed and unfortunately I do not have sooner appts.

## 2023-12-17 NOTE — Telephone Encounter (Signed)
 Patient states she had 2 missed calls from the office. Already spoke with triage RN yesterday. No new or worsening symptoms but would like to return the call to the office. Called [facility] and transferred patient to speak with office staff.

## 2023-12-17 NOTE — Telephone Encounter (Signed)
 Spoke with pt and she is still having issues with her shoulder hurting. Pt would like to have something else sent in for pain. We do not have an available appointments (pt is a 40 minute pt) this week.

## 2023-12-17 NOTE — Telephone Encounter (Signed)
 Spoke with pt and she is aware of Tabitha's response. She will go to [facility] urgent care.

## 2023-12-18 ENCOUNTER — Ambulatory Visit: Admitting: Family

## 2023-12-19 DIAGNOSIS — E1165 Type 2 diabetes mellitus with hyperglycemia: Secondary | ICD-10-CM | POA: Diagnosis not present

## 2023-12-19 DIAGNOSIS — E1142 Type 2 diabetes mellitus with diabetic polyneuropathy: Secondary | ICD-10-CM | POA: Diagnosis not present

## 2023-12-22 ENCOUNTER — Ambulatory Visit: Admitting: Orthopedic Surgery

## 2023-12-22 ENCOUNTER — Encounter: Payer: Self-pay | Admitting: Orthopedic Surgery

## 2023-12-22 VITALS — BP 130/84 | Ht 62.0 in | Wt 183.2 lb

## 2023-12-22 DIAGNOSIS — M25512 Pain in left shoulder: Secondary | ICD-10-CM | POA: Diagnosis not present

## 2023-12-22 DIAGNOSIS — M4722 Other spondylosis with radiculopathy, cervical region: Secondary | ICD-10-CM | POA: Diagnosis not present

## 2023-12-22 DIAGNOSIS — M47812 Spondylosis without myelopathy or radiculopathy, cervical region: Secondary | ICD-10-CM

## 2023-12-22 DIAGNOSIS — M25511 Pain in right shoulder: Secondary | ICD-10-CM | POA: Diagnosis not present

## 2023-12-22 DIAGNOSIS — R251 Tremor, unspecified: Secondary | ICD-10-CM

## 2023-12-22 DIAGNOSIS — M5412 Radiculopathy, cervical region: Secondary | ICD-10-CM

## 2023-12-22 NOTE — Patient Instructions (Signed)
 It was so nice to see you today. Thank you so much for coming in.    You have some mild wear and tear in your neck. This may be causing your neck pain and could be causing some of your arm pain.   I want to get an MRI of your neck to look into things further. We will get this approved through your insurance and [facility] will call you to schedule the appointment. Ask about your patient responsibility. You do not need to pay this prior to getting MRI, they can bill you.   [facility] is located at [address] in [place]. This is near the intersection of [address] and [address]   After you have the MRI, it can take 14-28 days for me to get the results back. If I don't have them in 2 weeks, we will call to try to get the results.   Once I have the results, we will call you to schedule a follow up visit with me to review them.   I think the pain in your shoulders is coming from your shoulders. I will get the notes from [facility] to review. We can have you see ortho here at [facility] if needed.   Follow up with rheumatology as scheduled on 01/05/24.   I want you to see neurology at the [facility] clinic for further evaluation of your tremor. I recommend you see Dr. Maree. They should call you to schedule an appointment or you can call them at [phone].   Please do not hesitate to call if you have any questions or concerns. You can also message me in MyChart.   Glade Boys PA-C [phone]     The physicians and staff at [facility] at [facility] are committed to providing excellent care. You may receive a survey asking for feedback about your experience at our office. We value you your feedback and appreciate you taking the time to to fill it out. The [facility] leadership team is also available to discuss your experience in person, feel free to contact us  [phone].

## 2023-12-23 ENCOUNTER — Other Ambulatory Visit: Payer: Self-pay | Admitting: Family

## 2023-12-23 ENCOUNTER — Ambulatory Visit: Admitting: Family

## 2023-12-23 DIAGNOSIS — M47812 Spondylosis without myelopathy or radiculopathy, cervical region: Secondary | ICD-10-CM

## 2023-12-24 DIAGNOSIS — M25512 Pain in left shoulder: Secondary | ICD-10-CM | POA: Diagnosis not present

## 2023-12-27 ENCOUNTER — Inpatient Hospital Stay
Admission: RE | Admit: 2023-12-27 | Discharge: 2023-12-27 | Attending: Orthopedic Surgery | Admitting: Orthopedic Surgery

## 2023-12-27 DIAGNOSIS — M4802 Spinal stenosis, cervical region: Secondary | ICD-10-CM | POA: Diagnosis not present

## 2023-12-27 DIAGNOSIS — M25511 Pain in right shoulder: Secondary | ICD-10-CM

## 2023-12-27 DIAGNOSIS — M5020 Other cervical disc displacement, unspecified cervical region: Secondary | ICD-10-CM | POA: Diagnosis not present

## 2023-12-27 DIAGNOSIS — M5412 Radiculopathy, cervical region: Secondary | ICD-10-CM

## 2023-12-27 DIAGNOSIS — M47812 Spondylosis without myelopathy or radiculopathy, cervical region: Secondary | ICD-10-CM

## 2023-12-30 ENCOUNTER — Encounter: Payer: Self-pay | Admitting: Internal Medicine

## 2023-12-30 ENCOUNTER — Ambulatory Visit: Admitting: Internal Medicine

## 2023-12-30 VITALS — BP 120/74 | HR 93 | Ht 62.0 in | Wt 189.4 lb

## 2023-12-30 DIAGNOSIS — E1065 Type 1 diabetes mellitus with hyperglycemia: Secondary | ICD-10-CM

## 2023-12-30 DIAGNOSIS — E785 Hyperlipidemia, unspecified: Secondary | ICD-10-CM | POA: Diagnosis not present

## 2023-12-30 DIAGNOSIS — E1042 Type 1 diabetes mellitus with diabetic polyneuropathy: Secondary | ICD-10-CM | POA: Diagnosis not present

## 2023-12-30 LAB — POCT GLYCOSYLATED HEMOGLOBIN (HGB A1C): Hemoglobin A1C: 8.3 % — AB (ref 4.0–5.6)

## 2023-12-30 NOTE — Patient Instructions (Addendum)
 Please restart: - Ozempic  0.25 mg weekly and slowly increase towards 1 mg weekly  On the Ozempic  1 mg pen: - 18 clicks  - 0.25 mg - 36 clicks - 0.5 mg - 54 clicks - 0.75 mg - 72 clicks - 1 mg  Please continue the iLet pump.  Announce all meals at the start of the meal, and not later.  Please return in 4 months.

## 2023-12-30 NOTE — Progress Notes (Signed)
 Patient ID: Anita Massey, female   DOB: 1980-10-15, 43 y.o.   MRN: [id number]  HPI: Anita Massey is a 43 y.o.-year-old female, self-referred, for management of DM1, dx in 2020, but as DM1 in 07/2023, insulin -dependent since 2022, uncontrolled, with long term complications (PN).  Her mother Anita Massey) is also my patient.  Last visit 4 months ago. She previously saw Dr. Braulio. She has Medicaid.  Interim history: No increased urination, blurry vision.  However, she had sulfur burps, nausea, diarrhea with the higher doses of Ozempic  so she stopped the medication. Before last visit, she was able to start the iLet insulin  pump. Sugars improved dramatically and she also started to feel much better. She has shoulder pain.  This is currently under investigation with MRI.  She declines steroid injections. She was found to have a positive ANA and will see rheumatology. She has a URI -congestion, headache.  Insulin  pump: - Omnipod 5 - started 10/09/2022 then came off after running out of pods as she was not given a refill from the pharmacy - iLet pump was initially not covered by her insurance - back on OmniPod 5 - Finally able to start the iLet pump 08/13/2023  CGM: - Dexcom G6 >> G7   Insulin : - Humalog  U200 initially in the pump - then Humalog  U100 starting 10/10/2022 - now FiAsp  Cartridges  Supplies: [facility] [address] [place]  Reviewed HbA1c: Lab Results  Component Value Date   HGBA1C 9.4 (A) 09/04/2023   HGBA1C >15.0 (>) 07/01/2023   HGBA1C 11.1 (A) 01/01/2023   HGBA1C 14.2 (A) 09/24/2022   HGBA1C 11.1 (A) 05/29/2022   HGBA1C 9.7 (A) 02/28/2022   HGBA1C 9.5 (A) 12/28/2021   HGBA1C 9.9 (A) 10/19/2021   HGBA1C 8.9 (A) 05/11/2021   HGBA1C 9.7 (A) 02/16/2021  11/23/2021 (records from [facility] accessed by pt. During the appt.): HbA1c calculated from fructosamine: 8.1%  At last visit she was on: - Humalog  7 to 14  >> 26 units before every meal (2x a  day) - Trulicity  0.75 mg weekly >> Ozempic  0.25 mg weekly - Lantus  15 >>... 40 units  in am (for the last week) or at bedtime >> 30 units at bedtime She was not able to tolerate metformin  due to nausea but also tingling. She was not able to tolerate higher doses of Trulicity  due to stool incontinence. She was previously on Farxiga  10 mg before breakfast >> stopped in 2024 b/c not covered  At last visit she was on: OmniPod 5 insulin  pump with Dexcom CGM: - Basal rates: 12 am:  1.1 >> 1.4 units/h - Insulin  to carb ratio: 12 am:  1:1 (enter 40-50% more carbs and bolus 15 min before meals, and also do frequent corrections for sugars >180) - Target: 12 am: 120-120 - Correction factor (insulin  sensitivity factor):  12 am: 50  - Active insulin  time: 4h  She is currently on iLet insulin  pump  Now announcing the meals only 1.1 times a day.  Total daily dose from basal insulin : 66% >> 69% >> 73% >> 35 >> 48 units TDD: 36-70 >> 24-70 >> 83.4 >> 116 units per day  She is also on Ozempic  0.5 >> 1 mg weekly, but off now.  She checks sugars more than 4 times a day with her CGM:   Prev.:    Lowest sugar was50s >> 100 >> 200s; hypoglycemia awareness at 100. Highest sugar was  HI >> HI  Glucometer: Accu-Chek guide  Pt's meals are: - Breakfast: oatmeal, cereal, eggs - Lunch: sandwich, pizza - Dinner: chicken or other meat, veggies, salad, baked potatoes - Snacks: 2 Stopped sweet tea and sodas  -only has these rarely now.  - no CKD, last BUN/creatinine:  Lab Results  Component Value Date   BUN 12 10/21/2023   BUN 9 08/20/2023   CREATININE 0.60 10/21/2023   CREATININE 0.57 08/20/2023   Lab Results  Component Value Date   MICRALBCREAT 19 09/04/2023  She is not on ACE inhibitor/ARB.  -+ HL; last set of lipids: Lab Results  Component Value Date   CHOL 258 (H) 11/13/2023   HDL 44.80 11/13/2023   LDLCALC 161 (H) 11/13/2023   TRIG 262.0 (H) 11/13/2023   CHOLHDL 6 11/13/2023   In 04/2022, we started Lipitor >> now 80 mg daily.  - last eye exam was 2025: No DR reportedly. Elevated IO pressure. On eye drops. Dr. Maree.  - + numbness and tingling in her feet.  Last foot exam was on 11/11/2022.  Pt has FH of DM in mother (who is also my patient), and maternal side of the family.  She has a history of yeast infections for which she intermittently takes  Diflucan . She also has history of HTN, microscopic hematuria, elevated liver enzymes, GERD, obesity (in the past, she was weighing over 200 pounds), anemia, rosacea, homozygosity of MTHFR variant, anxiety.  ROS: + See HPI  Past Medical History:  Diagnosis Date   Anemia    Anxiety state, unspecified    Blood dyscrasia    PROTHROMBIN G 79789 HETEROZYGOSITY (FACTOR 2)   Clotting disorder    Diabetes mellitus without complication (HCC)    GERD (gastroesophageal reflux disease)    OCC   Homozygous for MTHFR gene mutation    Hypertension    Hypothyroidism    Multiple joint pain 09/04/2015   Obesity    Rosacea    Undiagnosed cardiac murmurs    Unspecified essential hypertension    Past Surgical History:  Procedure Laterality Date   APPENDECTOMY  2009   [facility]   CESAREAN SECTION  2016   CESAREAN SECTION N/A 11/15/2018   Procedure: REPEAT CESAREAN SECTION;  Surgeon: Victor Claudell SAUNDERS, MD;  Location: [facility] ORS;  Service: Obstetrics;  Laterality: N/A;  Female born @ 43 Apgars: 8/9 Weight: 6lbs 10 ozs   CHOLECYSTECTOMY N/A 03/20/2016   Procedure: LAPAROSCOPIC CHOLECYSTECTOMY;  Surgeon: Laneta JULIANNA Luna, MD;  Location: [facility] ORS;  Service: General;  Laterality: N/A;   DILATION AND CURETTAGE OF UTERUS     Social History   Socioeconomic History   Marital status: Married    Spouse name: Lonni   Number of children: 3   Years of education: Not on file   Highest education level: Not on file  Occupational History   Occupation: Homemaker  Tobacco Use   Smoking status: Former    Current packs/day: 0.00     Average packs/day: 0.3 packs/day for 1 year (0.3 ttl pk-yrs)    Types: Cigarettes    Start date: 03/13/1997    Quit date: 03/13/1998    Years since quitting: 25.8   Smokeless tobacco: Never  Vaping Use   Vaping status: Never Used  Substance and Sexual Activity   Alcohol use: No   Drug use: No   Sexual activity: Not Currently    Birth control/protection: I.U.D.    Comment: Mirena    Other Topics Concern   Not on file  Social History Narrative  Social Drivers of Health   Tobacco Use: Medium Risk (12/22/2023)   Patient History    Smoking Tobacco Use: Former    Smokeless Tobacco Use: Never    Passive Exposure: Not on Actuary Strain: Not on file  Food Insecurity: Not on file  Transportation Needs: Not on file  Physical Activity: Not on file  Stress: Not on file  Social Connections: Not on file  Intimate Partner Violence: Not on file  Depression (PHQ2-9): Medium Risk (10/21/2023)   Depression (PHQ2-9)    PHQ-2 Score: 5  Alcohol Screen: Not on file  Housing: Not on file  Utilities: Not on file  Health Literacy: Not on file   Current Outpatient Medications on File Prior to Visit  Medication Sig Dispense Refill   Accu-Chek Softclix Lancets lancets Use to check blood sugar 3-4 times daily 100 each 12   ALPRAZolam  (XANAX ) 0.25 MG tablet Take 1 tablet (0.25 mg total) by mouth 2 (two) times daily as needed for anxiety. 30 tablet 0   amLODipine  (NORVASC ) 10 MG tablet Take 1 tablet (10 mg total) by mouth daily. 90 tablet 0   aspirin  EC 81 MG tablet Take 1 tablet (81 mg total) by mouth daily. Swallow whole.     atorvastatin  (LIPITOR) 80 MG tablet Take 1 tablet (80 mg total) by mouth daily. 90 tablet 3   Blood Glucose Monitoring Suppl DEVI 1 each by Does not apply route in the morning, at noon, and at bedtime. May substitute to any manufacturer covered by patient's insurance. 1 each 0   Continuous Glucose Sensor (DEXCOM G7 SENSOR) MISC Use to monitor glucose  continuously change every 10 days 6 each 1   cyclobenzaprine  (FLEXERIL ) 5 MG tablet Take 0.5-1 tablets (2.5-5 mg total) by mouth at bedtime. 30 tablet 0   dorzolamide-timolol (COSOPT) 2-0.5 % ophthalmic solution 1 drop 2 (two) times daily.     EPINEPHrine  0.3 mg/0.3 mL IJ SOAJ injection Inject 0.3 mg into the muscle as needed for anaphylaxis. 2 each 0   fluticasone  (FLONASE ) 50 MCG/ACT nasal spray Place 2 sprays into both nostrils daily. 16 g 0   gabapentin  (NEURONTIN ) 300 MG capsule TAKE 1 CAPSULE BY MOUTH THREE TIMES DAILY 90 capsule 2   Glucose Blood (BLOOD GLUCOSE TEST STRIPS) STRP 1 each by In Vitro route in the morning, at noon, and at bedtime. May substitute to any manufacturer covered by patient's insurance. 100 strip 5   Insulin  Aspart, w/Niacinamide, (FIASP  PUMPCART) 100 UNIT/ML SOCT Use up to 130 units in the insulin  pump daily 90 mL 3   Insulin  Aspart, w/Niacinamide, (FIASP ) 100 UNIT/ML SOLN Use up to 100 units in the insulin  pump daily 90 mL 3   insulin  glargine (LANTUS ) 100 UNIT/ML injection Inject 0.3 mLs (30 Units total) into the skin 2 (two) times daily. 30 mL 11   Insulin  Pen Needle 32G X 4 MM MISC Use 4x a day 300 each 3   ketoconazole  (NIZORAL ) 2 % cream Apply 1 application topically in the morning and at bedtime. 60 g 5   Lancets MISC 1 each by Does not apply route as directed. Dispense based on patient and insurance preference. Use up to four times daily as directed. (FOR ICD-10 E10.9, E11.9). 100 each 0   levonorgestrel  (MIRENA ) 20 MCG/DAY IUD 1 each by Intrauterine route once.     nystatin  (MYCOSTATIN /NYSTOP ) powder Apply 1 Application topically 3 (three) times daily. 15 g 0   ondansetron  (ZOFRAN  ODT) 4 MG disintegrating tablet  Take 1 tablet (4 mg total) by mouth every 8 (eight) hours as needed for nausea or vomiting. 20 tablet 0   Semaglutide , 1 MG/DOSE, (OZEMPIC , 1 MG/DOSE,) 4 MG/3ML SOPN Inject 1 mg into the skin once a week. 9 mL 3   sertraline  (ZOLOFT ) 100 MG tablet Take  2 tablets (200 mg total) by mouth daily. 180 tablet 3   valACYclovir  (VALTREX ) 1000 MG tablet Take 2 tablets (2,000 mg total) by mouth daily. And repeat once in 12 hours---for cold sore 20 tablet 0   No current facility-administered medications on file prior to visit.   Allergies  Allergen Reactions   Bee Venom Anaphylaxis   Biaxin [Clarithromycin] Hives   Metformin  And Related Nausea And Vomiting    Also bad tingling   Sulfa  Antibiotics Anaphylaxis   Toradol  [Ketorolac  Tromethamine ]     Chest pain    Prednisone  Anxiety and Palpitations    Makes me feel crazy   Morphine  And Codeine     Chest Pain    Tramadol  Other (See Comments)   Trulicity  [Dulaglutide ] Diarrhea    Bowel incontinence on high dose, tolerating 0.75 mg ok per patient   Tape Rash    Paper tape is fine   Family History  Problem Relation Age of Onset   Mitral valve prolapse Mother    Arrhythmia Mother    Hypertension Mother    Diabetes Mother    Arthritis Mother        psoriatic   Fibromyalgia Mother    Hypertension Father    Hyperlipidemia Father    Diabetes Brother    Ovarian cancer Maternal Grandmother    Diabetes Maternal Grandmother    Melanoma Maternal Grandmother    Parkinson's disease Maternal Grandmother    Heart disease Maternal Grandfather    Lung cancer Maternal Grandfather    Cancer Maternal Grandfather    Parkinson's disease Paternal Grandmother    Heart disease Maternal Aunt    Diabetes Maternal Aunt    Depression Maternal Aunt    Diabetes Maternal Uncle    PE: BP 120/74   Pulse 93   Ht 5' 2 (1.575 m)   Wt 189 lb 6.4 oz (85.9 kg)   SpO2 99%   BMI 34.64 kg/m  Wt Readings from Last 3 Encounters:  12/30/23 189 lb 6.4 oz (85.9 kg)  12/22/23 183 lb 4 oz (83.1 kg)  11/13/23 186 lb 3.2 oz (84.5 kg)   Constitutional: normal weight, in NAD Eyes: EOMI, no exophthalmos ENT: no thyromegaly, no cervical lymphadenopathy Cardiovascular: Tachycardia, RR, No MRG Respiratory: CTA  B Musculoskeletal: no deformities Skin: no rashes Neurological: + Mild tremor with outstretched hands Diabetic Foot Exam - Simple   Simple Foot Form Diabetic Foot exam was performed with the following findings: Yes 12/30/2023 11:56 AM  Visual Inspection No deformities, no ulcerations, no other skin breakdown bilaterally: Yes Sensation Testing Intact to touch and monofilament testing bilaterally: Yes Pulse Check Posterior Tibialis and Dorsalis pulse intact bilaterally: Yes Comments    ASSESSMENT: 1. DM1, insulin -dependent, uncontrolled, with complications -Peripheral neuropathy  Component     Latest Ref Rng 07/24/2023  Glucose     65 - 99 mg/dL 88   C-Peptide     9.19 - 3.85 ng/mL 0.41 (L)     2. HL  PLAN:  1. Patient with a very uncontrolled type 1 diabetes, diagnosed as such in 07/2023, when a C-peptide returned low. She is on a GLP-1 receptor agonist and was previously on the OmniPod  5 insulin  pump but fortunately we were able to switch to the iLet pump 3 weeks prior to her last appointment, after her type 1 diabetes diagnosis.  Sugars improved dramatically afterwards. - HbA1c was undetectably high before starting the pump: >15% but this improved to 9.4% at last visit 3 weeks after starting the pump.  At that time, sugars were drastically improved, fluctuating within the target range overnight but then increasing significantly after breakfast.  They were occasionally higher after the rest of the meals, including lunch.  She was doing a good job announcing 100% of dinners but not all of the breakfast and lunch meals.  We discussed about trying to do so but did not recommend any changes.  She was off Ozempic  and I called in another prescription for her. CGM interpretation: -At today's visit, we reviewed her CGM downloads: It appears that 33% of values are in target range (goal >70%), while 67% are higher than 180 (goal <25%), and 0% are lower than 70 (goal <4%).  The calculated  average blood sugar is 234.  The projected HbA1c for the next 3 months (GMI) is 8.9%. -Reviewing the CGM trends, sugars appear to be higher than before, increasing after every meal.  Reviewing the pump downloads, she is not announcing the meals consistently.  She will reanalysis approximately 1 meal a day.  We discussed that it would be very important to announce all of the meals.  Even when she is announcing the meals, she is doing so after the sugars are already high after meals.  This is conducive to very high blood sugars after the meals and drops of blood sugars in the late postprandial period.  Besides trying to announce all 3 meals of the day, we also discussed about possibly restarting Ozempic , per her request, but at a lower dose and increase it very slowly. - I suggested to:  Patient Instructions  Please restart: - Ozempic  0.25 mg weekly and slowly increase towards 1 mg weekly  On the Ozempic  1 mg pen: - 18 clicks  - 0.25 mg - 36 clicks - 0.5 mg - 54 clicks - 0.75 mg - 72 clicks - 1 mg  Please continue the iLet pump.  Announce all meals at the start of the meal, and not later.  Please return in 4 months.  - we checked her HbA1c: 8.3% (lower) - advised to check sugars at different times of the day - 4x a day, rotating check times - advised for yearly eye exams >> she is UTD - return to clinic in 4 months  2.HL - Latest lipid fractions showed a very high LDL at 161, also elevated triglycerides: Lab Results  Component Value Date   CHOL 258 (H) 11/13/2023   HDL 44.80 11/13/2023   LDLCALC 161 (H) 11/13/2023   TRIG 262.0 (H) 11/13/2023   CHOLHDL 6 11/13/2023  - She is on Lipitor 80 mg daily, tolerating well, dose increased by cardiology since last visit  Lela Fendt, MD PhD [facility] Endocrinology

## 2024-01-01 DIAGNOSIS — M7502 Adhesive capsulitis of left shoulder: Secondary | ICD-10-CM | POA: Diagnosis not present

## 2024-01-01 DIAGNOSIS — M25512 Pain in left shoulder: Secondary | ICD-10-CM | POA: Diagnosis not present

## 2024-01-09 ENCOUNTER — Other Ambulatory Visit: Payer: Self-pay | Admitting: Rheumatology

## 2024-01-09 DIAGNOSIS — G8929 Other chronic pain: Secondary | ICD-10-CM

## 2024-01-09 DIAGNOSIS — M3500 Sicca syndrome, unspecified: Secondary | ICD-10-CM

## 2024-01-09 DIAGNOSIS — M791 Myalgia, unspecified site: Secondary | ICD-10-CM

## 2024-01-12 ENCOUNTER — Encounter: Payer: Self-pay | Admitting: Rheumatology

## 2024-01-14 ENCOUNTER — Telehealth: Payer: Self-pay

## 2024-01-14 ENCOUNTER — Telehealth: Payer: Self-pay | Admitting: Pharmacy Technician

## 2024-01-14 ENCOUNTER — Other Ambulatory Visit (HOSPITAL_COMMUNITY): Payer: Self-pay

## 2024-01-14 NOTE — Progress Notes (Deleted)
 "  My Chart Video Visit- Progress Note: Referring Physician:  Corwin Antu, FNP [address] [place],  [place] [address]  Primary Physician:  Corwin Antu, FNP  This visit was performed via MyChart/video.   Patient location: home Provider location: working from home  I spent a total of *** minutes non-face-to-face activities for this visit on the date of this encounter including review of current clinical condition and response to treatment.    Patient has given verbal consent to this MyChart video visit and we reviewed the limitations of a MyChart video visit. Patient wishes to proceed.    Chief Complaint:  review cervical MRI  History of Present Illness: Anita Massey is a 43 y.o. female has a history of  HTN, IBS, adrenal adenoma, hypothyroidism, DM, depression, panic anxiety syndrome, clotting disorder (MTHFR gene mutation).   Last seen by me on 12/22/23 for neck and bilateral arm pain to her elbows. She has known cervical spondylosis.   She continues to follow with rheumatology.   She saw [facility] around 01/01/24 and was sent to PT for her shoulders. Left shoulder injection pending insurance approval.   MyChart visit scheduled to review her cervical MRI.        She has constant neck pain that radiates into both arms to above her elbows x 6 months. Neck pain < arm pain. Left arm pain > right arm pain. She has intermittent tingling in both hands. She has weakness  in both arms (hard to open jars). Some relief with neurontin  and ice. Pain is worse with cold weather. Pain is also worse with using her arms. No known injury.    Her primary pain is left > right shoulder pain. Cannot wash her hair or put on her bra due to pain.    She notes family history of parkinsons and she has bilateral hand tremors. Has not seen neurology for this- has appointment in January with Dr. Evonnie, but does not think she can get to [facility]. Would like to see Dr. Maree here in  [place].    She saw [facility] last week for her shoulders. No injections done. She thinks he is ordering an MRI of her shoulders.    Tobacco use: Does not smoke.    Bowel/Bladder Dysfunction: none, some bowel urgency x years   Conservative measures:  Physical therapy: has not participated in Multimodal medical therapy including regular antiinflammatories:  Tylenol , Gabapentin , Flexeril , Lidocaine  patches Injections: no epidural steroid injections   Past Surgery: no spine surgery   SHANAUTICA FORKER has no symptoms of cervical myelopathy.   The symptoms are causing a significant impact on the patient's life.    Exam: General: Patient is well developed, well nourished, calm, collected, and in no apparent distress. Attention to examination is appropriate.  Respiratory: Patient is breathing without any difficulty.    Awake, alert, oriented to person, place, and time.  Speech is clear and fluent. Fund of knowledge is appropriate.   ***    Imaging: Cervical MRI dated 12/27/23:  FINDINGS: Alignment: Normal.   Vertebrae: No fracture, evidence of discitis, or bone lesion.   Cord: Normal signal throughout.   Posterior Fossa, vertebral arteries, paraspinal tissues: Negative.   Disc levels:   C2-3: Mild facet degenerative disease on the left. Minimal disc bulge. No stenosis.   C3-4: There is a disc bulge, ligamentum flavum thickening and uncovertebral spurring, worse on the left. There is moderate spinal stenosis with flattening of the ventral  cord, worse to the left. Mild to moderate foraminal narrowing is also worse on the left.   C4-5: Shallow disc bulge, ligamentum flavum thickening and bilateral uncovertebral spurring. There is mild central canal stenosis with narrowing of the ventral thecal sac. Mild to moderate bilateral foraminal narrowing is present.   C5-6: There is a shallow disc bulge and left worse than right uncovertebral spurring. The central canal and  right foramen are open. Mild-to-moderate left foraminal narrowing is present.   C6-7: Shallow central disc protrusion and uncovertebral spurring, greater on the left. Mild ligamentum flavum thickening. There is mild spinal stenosis and right foraminal narrowing. The left foramen is open.   C7-T1: Negative.   IMPRESSION: 1. Cervical spondylosis appears worst at C3-4 where there is moderate spinal stenosis with flattening of the ventral cord, worse to the left. Mild to moderate foraminal narrowing is also worse on the left. 2. Mild central canal stenosis and mild to moderate bilateral foraminal narrowing at C4-5. 3. Mild to moderate left foraminal narrowing at C5-6. 4. Mild spinal stenosis and right foraminal narrowing at C6-7.     Electronically Signed   By: Debby Prader M.D.   On: 12/30/2023 11:42     I have personally reviewed the images and agree with the above interpretation.  Assessment and Plan: Ms. Polasek has 6 month history of constant neck pain that radiates into both arms to above her elbows. Neck pain < arm pain. Left arm pain > right arm pain. She has intermittent tingling in both hands. She has weakness  in both arms (hard to open jars). No known injury.    Her primary pain is left > right shoulder pain. Cannot wash her hair or put on her bra due to pain.    She has known cervical spondylosis.    Treatment options discussed with patient and following plan made:    - MRI of cervical spine to further evaluate neck and bilateral arm pain.  - As above, I think a lot of her pain is shoulder mediated. Will get records from [facility].  - She will follow up with rheumatology as scheduled on 01/05/24.  - She has bilateral hand tremor. Has appointment to see neurology ([place]) in January, but wants to stay in [place]. Referral to Dr. Maree.  - Will schedule phone or MyChart visit to review MRI results once I get them back.   Glade Boys PA-C Neurosurgery "

## 2024-01-14 NOTE — Telephone Encounter (Signed)
 Pharmacy Patient Advocate Encounter   Received notification from Onbase that prior authorization for Dexcom G7 Sensor  is required/requested.   Insurance verification completed.   The patient is insured through [employer].   Per test claim: PA required; PA submitted to above mentioned insurance via Latent Key/confirmation #/EOC [id number] Status is pending

## 2024-01-14 NOTE — Telephone Encounter (Signed)
Pt needs a PA for Dexcom G7

## 2024-01-16 ENCOUNTER — Other Ambulatory Visit (HOSPITAL_COMMUNITY): Payer: Self-pay

## 2024-01-16 ENCOUNTER — Telehealth: Admitting: Orthopedic Surgery

## 2024-01-16 NOTE — Telephone Encounter (Signed)
 Pharmacy Patient Advocate Encounter  Received notification from [facility] MEDICAID that Prior Authorization for Dexcom G7 Sensor  has been APPROVED from 01/14/24 to 01/13/25. Ran test claim, Copay is $0.00. This test claim was processed through [facility]- copay amounts may vary at other pharmacies due to pharmacy/plan contracts, or as the patient moves through the different stages of their insurance plan.   PA #/Case ID/Reference #: [id number]

## 2024-01-16 NOTE — Progress Notes (Addendum)
 "  Telephone Visit- Progress Note: Referring Physician:  Corwin Antu, FNP [address] [place],  [place] [address]  Primary Physician:  Corwin Antu, FNP  This visit was performed via telephone.  Patient location: home Provider location: working from home  I spent a total of 15 minutes non-face-to-face activities for this visit on the date of this encounter including review of current clinical condition and response to treatment.    Patient has given verbal consent to this telephone visit and we reviewed the limitations of a telephone visit. Patient wishes to proceed.    Chief Complaint:  review cervical MRI  History of Present Illness: Anita Massey is a 44 y.o. female has a history of  HTN, IBS, adrenal adenoma, hypothyroidism, DM, depression, panic anxiety syndrome, clotting disorder (MTHFR gene mutation).   Last seen by me on 12/22/23 for neck and bilateral arm pain to her elbows. She has known cervical spondylosis.   She continues to follow with rheumatology.   She saw [facility] around 01/01/24 and was sent to PT for her shoulders. Left shoulder injection pending insurance approval.   Telephone visit scheduled to review her cervical MRI.   She is about the same. She has constant neck pain that radiates into both arms to above her elbows. She continues with pain in both shoulders- she continues with pain when she moves her shoulders. No numbness or tingling in her hands. She has weakness in hands and is dropping things. She can't open jars. She can do buttons and zippers. No balance issues.   She had covid over christmas but is feeling better.    Tobacco use: Does not smoke.    Bowel/Bladder Dysfunction: none, some bowel urgency x years   Conservative measures:  Physical therapy: has not participated in Multimodal medical therapy including regular antiinflammatories:  Tylenol , Gabapentin , Flexeril , Lidocaine  patches Injections: no epidural steroid  injections   Past Surgery: no spine surgery   Anita Massey has no symptoms of cervical myelopathy.   The symptoms are causing a significant impact on the patient's life.    Exam: No exam as this was a phone visit.     Imaging: Cervical MRI dated 12/27/23:  FINDINGS: Alignment: Normal.   Vertebrae: No fracture, evidence of discitis, or bone lesion.   Cord: Normal signal throughout.   Posterior Fossa, vertebral arteries, paraspinal tissues: Negative.   Disc levels:   C2-3: Mild facet degenerative disease on the left. Minimal disc bulge. No stenosis.   C3-4: There is a disc bulge, ligamentum flavum thickening and uncovertebral spurring, worse on the left. There is moderate spinal stenosis with flattening of the ventral cord, worse to the left. Mild to moderate foraminal narrowing is also worse on the left.   C4-5: Shallow disc bulge, ligamentum flavum thickening and bilateral uncovertebral spurring. There is mild central canal stenosis with narrowing of the ventral thecal sac. Mild to moderate bilateral foraminal narrowing is present.   C5-6: There is a shallow disc bulge and left worse than right uncovertebral spurring. The central canal and right foramen are open. Mild-to-moderate left foraminal narrowing is present.   C6-7: Shallow central disc protrusion and uncovertebral spurring, greater on the left. Mild ligamentum flavum thickening. There is mild spinal stenosis and right foraminal narrowing. The left foramen is open.   C7-T1: Negative.   IMPRESSION: 1. Cervical spondylosis appears worst at C3-4 where there is moderate spinal stenosis with flattening of the ventral cord, worse to the left. Mild  to moderate foraminal narrowing is also worse on the left. 2. Mild central canal stenosis and mild to moderate bilateral foraminal narrowing at C4-5. 3. Mild to moderate left foraminal narrowing at C5-6. 4. Mild spinal stenosis and right foraminal narrowing at  C6-7.     Electronically Signed   By: Debby Prader M.D.   On: 12/30/2023 11:42     I have personally reviewed the images and agree with the above interpretation.  Assessment and Plan: She has constant neck pain that radiates into both arms to above her elbows. She continues with pain in both shoulders- she continues with pain when she moves her shoulders. No numbness or tingling in her hands. She has weakness in hands and is dropping things.     She has moderate central stenosis C3-C4 and mild central stenosis C4-C5 and C6-C7 along with multilevel foraminal stenosis.   I felt a lot of her symptoms at last visit were shoulder mediated- she is seeing [facility] as above.   She is also seeing rheumatology.   She did not have any signs/symptoms of myelopathy at last visit. No significant dexterity or balance issues.   Treatment options discussed with patient and following plan made:   - We discussed treatment options for cervical spine including PT and cervical injections.  - We decided to hold on these for now while she has treatment of her shoulders. She will call [facility] to schedule left shoulder injection and start PT.  - Will message her in 2 weeks to check on her progress. Based on her exam at her last visit, I felt most of her pain was shoulder mediated.   Glade Boys PA-C Neurosurgery "

## 2024-01-23 ENCOUNTER — Ambulatory Visit: Admitting: Orthopedic Surgery

## 2024-01-23 ENCOUNTER — Encounter: Payer: Self-pay | Admitting: Orthopedic Surgery

## 2024-01-23 DIAGNOSIS — M4722 Other spondylosis with radiculopathy, cervical region: Secondary | ICD-10-CM

## 2024-01-23 DIAGNOSIS — M4802 Spinal stenosis, cervical region: Secondary | ICD-10-CM

## 2024-01-23 DIAGNOSIS — M47812 Spondylosis without myelopathy or radiculopathy, cervical region: Secondary | ICD-10-CM

## 2024-01-23 DIAGNOSIS — M25512 Pain in left shoulder: Secondary | ICD-10-CM | POA: Diagnosis not present

## 2024-01-23 DIAGNOSIS — M5412 Radiculopathy, cervical region: Secondary | ICD-10-CM

## 2024-01-23 DIAGNOSIS — M25511 Pain in right shoulder: Secondary | ICD-10-CM

## 2024-01-23 NOTE — Addendum Note (Signed)
 Addended by: HILMA HASTINGS on: 01/23/2024 11:30 AM   Modules accepted: Level of Service

## 2024-01-23 NOTE — Telephone Encounter (Signed)
 Had phone visit with her today and discussed her MRI and concerns.

## 2024-01-28 ENCOUNTER — Inpatient Hospital Stay
Admission: RE | Admit: 2024-01-28 | Discharge: 2024-01-28 | Disposition: A | Source: Ambulatory Visit | Attending: Rheumatology | Admitting: Rheumatology

## 2024-01-28 DIAGNOSIS — M3500 Sicca syndrome, unspecified: Secondary | ICD-10-CM

## 2024-01-28 DIAGNOSIS — G8929 Other chronic pain: Secondary | ICD-10-CM

## 2024-01-28 DIAGNOSIS — M791 Myalgia, unspecified site: Secondary | ICD-10-CM

## 2024-01-30 ENCOUNTER — Ambulatory Visit: Payer: Self-pay | Admitting: *Deleted

## 2024-01-30 NOTE — Telephone Encounter (Signed)
 Spoke with pt. I was on the phone with her for over 10 minutes. Advised her that she would need to be seen for the cough she has either virtually, at an urgent care or ED. Pt is going to seek care virtually through [facility]. Pt has been scheduled to see Tabitha to address the other issues on 02/12/24 at 1020.

## 2024-01-30 NOTE — Telephone Encounter (Signed)
 Recommended UC and patient reports she does not want to have to take 3 children to UC and expose them to flu or covid again. Household had covid in Dec and patient continues with sx of cough , chest pain and SOB with cough. Please advise. See NT encounter.  CAL Tamara notified patient requesting to schedule appt prior to medicaid being cancelled to review request for disability.      FYI Only or Action Required?: FYI only for provider: recommended UC now and no available appt today .  Patient was last seen in primary care on 11/13/2023 by Corwin Antu, FNP.  Called Nurse Triage reporting Cough. Green phlegm  Symptoms began several weeks ago.  Interventions attempted: Rest, hydration, or home remedies.  Symptoms are: gradually worsening.  Triage Disposition: See HCP Within 4 Hours (Or PCP Triage)  Patient/caregiver understands and will follow disposition?: Unsure                 Reason for Disposition  [1] MILD difficulty breathing (e.g., minimal/no SOB at rest, SOB with walking, pulse < 100) AND [2] still present when not coughing  Answer Assessment - Initial Assessment Questions Recommended UC now . Patient reports she is having difficulty going to UC due to she has 3 children that she does not want to take with her and expose them to flu or covid. Patient had covid in Dec and still not over coughing spells. Now green phelgm noted and SOB with coughing. Patient reports blood sugars running greater than 200's and at times greater than 300's. Did not report blood glucose today. Patient wants to scheduled earliest appt due to requesting disability and medicaid will be canceled Feb 1st. Please advise.  CAL notified        1. ONSET: When did the cough begin?      Since Dec. Since covid  2. SEVERITY: How bad is the cough today?      Worsening  3. SPUTUM: Describe the color of your sputum (e.g., none, dry cough; clear, white, yellow, green)     Green  4.  HEMOPTYSIS: Are you coughing up any blood? If Yes, ask: How much? (e.g., flecks, streaks, tablespoons, etc.)     Streaks or tad at times 5. DIFFICULTY BREATHING: Are you having difficulty breathing? If Yes, ask: How bad is it? (e.g., mild, moderate, severe)      SOB after coughing episodes  6. FEVER: Do you have a fever? If Yes, ask: What is your temperature, how was it measured, and when did it start?     na 7. CARDIAC HISTORY: Do you have any history of heart disease? (e.g., heart attack, congestive heart failure)      Na  8. LUNG HISTORY: Do you have any history of lung disease?  (e.g., pulmonary embolus, asthma, emphysema)     Na  9. PE RISK FACTORS: Do you have a history of blood clots? (or: recent major surgery, recent prolonged travel, bedridden)     na 10. OTHER SYMPTOMS: Do you have any other symptoms? (e.g., runny nose, wheezing, chest pain)       Chest pain from coughing spells ,  11. PREGNANCY: Is there any chance you are pregnant? When was your last menstrual period?       na 12. TRAVEL: Have you traveled out of the country in the last month? (e.g., travel history, exposures)       na  Protocols used: Cough - Acute Productive-A-AH

## 2024-02-04 ENCOUNTER — Ambulatory Visit: Admitting: Neurology

## 2024-02-09 ENCOUNTER — Other Ambulatory Visit: Payer: Self-pay | Admitting: *Deleted

## 2024-02-09 ENCOUNTER — Encounter: Payer: Self-pay | Admitting: Family

## 2024-02-09 MED ORDER — SERTRALINE HCL 100 MG PO TABS: 200.0000 mg | ORAL_TABLET | Freq: Every day | ORAL | 0 refills | Status: AC

## 2024-02-09 MED ORDER — AMLODIPINE BESYLATE 10 MG PO TABS: 10.0000 mg | ORAL_TABLET | Freq: Every day | ORAL | 0 refills | Status: AC

## 2024-02-12 ENCOUNTER — Ambulatory Visit (INDEPENDENT_AMBULATORY_CARE_PROVIDER_SITE_OTHER): Admitting: Family

## 2024-02-12 ENCOUNTER — Encounter: Payer: Self-pay | Admitting: Family

## 2024-02-12 VITALS — BP 110/76 | HR 108 | Temp 98.2°F | Ht 62.0 in | Wt 188.0 lb

## 2024-02-12 DIAGNOSIS — M79602 Pain in left arm: Secondary | ICD-10-CM

## 2024-02-12 DIAGNOSIS — B3731 Acute candidiasis of vulva and vagina: Secondary | ICD-10-CM

## 2024-02-12 DIAGNOSIS — B9689 Other specified bacterial agents as the cause of diseases classified elsewhere: Secondary | ICD-10-CM

## 2024-02-12 DIAGNOSIS — N898 Other specified noninflammatory disorders of vagina: Secondary | ICD-10-CM

## 2024-02-12 DIAGNOSIS — J22 Unspecified acute lower respiratory infection: Secondary | ICD-10-CM | POA: Diagnosis not present

## 2024-02-12 DIAGNOSIS — F331 Major depressive disorder, recurrent, moderate: Secondary | ICD-10-CM | POA: Diagnosis not present

## 2024-02-12 DIAGNOSIS — M47812 Spondylosis without myelopathy or radiculopathy, cervical region: Secondary | ICD-10-CM

## 2024-02-12 DIAGNOSIS — G4719 Other hypersomnia: Secondary | ICD-10-CM | POA: Diagnosis not present

## 2024-02-12 DIAGNOSIS — M7989 Other specified soft tissue disorders: Secondary | ICD-10-CM

## 2024-02-12 DIAGNOSIS — L304 Erythema intertrigo: Secondary | ICD-10-CM

## 2024-02-12 MED ORDER — FLUCONAZOLE 150 MG PO TABS: ORAL_TABLET | ORAL | 0 refills | Status: AC

## 2024-02-12 MED ORDER — AMOXICILLIN-POT CLAVULANATE 875-125 MG PO TABS: 1.0000 | ORAL_TABLET | Freq: Two times a day (BID) | ORAL | 0 refills | Status: AC

## 2024-02-12 MED ORDER — BUSPIRONE HCL 5 MG PO TABS: 5.0000 mg | ORAL_TABLET | Freq: Two times a day (BID) | ORAL | 0 refills | Status: AC

## 2024-02-12 MED ORDER — NYSTATIN-TRIAMCINOLONE 100000-0.1 UNIT/GM-% EX OINT: 1.0000 | TOPICAL_OINTMENT | Freq: Two times a day (BID) | CUTANEOUS | 0 refills | Status: AC

## 2024-02-12 NOTE — Patient Instructions (Addendum)
" °  Please call gynecology to get scheduled:   [facility] [address] [place] [address] [phone]   ------------------------------------  Start b12 over the counter 1000 mcg once daily    ------------------------------------   If at any time you feel your needs are more urgent or you are concerned for your well being please see the below options:  For walk in options for mental health:   [facility] [address] in [place], [place]. Call our 24-hour HelpLine at [phone] or [phone] for immediate assistance for mental health and substance abuse issues.  And or walk into [facility] hospital ER.   [employer]: 1-800-SUICIDE  [employer] Suicide Prevention Lifeline: 1-800-273-TALK  Regards,   Ginger Patrick FNP-C  "

## 2024-02-12 NOTE — Progress Notes (Signed)
 "  Established Patient Office Visit  Subjective:      CC:  Chief Complaint  Patient presents with   Medical Management of Chronic Issues    HPI: Anita Massey is a 44 y.o. female presenting on 02/12/2024 for Medical Management of Chronic Issues .  Discussed the use of AI scribe software for clinical note transcription with the patient, who gave verbal consent to proceed.  History of Present Illness Anita Massey is a 44 year old female with cervical and foraminal stenosis who presents with shoulder and neck pain.  She has a history of cervical stenosis at C3-C4 and mild central stenosis at C4-C5 and C6-C7, along with multilevel foraminal stenosis. She recently visited neurosurgery, where it was suspected that her pain might be shoulder-mediated. She is currently seeing an orthopedist for her shoulder issues.  She received a shot in her shoulder and elbow yesterday, resulting in significant pain and limited arm mobility. She has not contacted the provider who administered the injection but describes the pain as severe and unusual compared to her previous experiences.  She follows with rheumatology and has a positive ANA, elevated sed rate, and CRP. She has been diagnosed with fibromyalgia and psoriasis. Hand and SI joint x-rays were ordered.  She has poorly controlled type 1 diabetes and is under the care of an endocrinologist. She switched to the ilet pump, which improved her blood sugar levels significantly, reducing her A1c from 15% to 9.4%. She has not restarted Ozempic  due to side effects and recent illness. She checks her blood sugar four times a day.  She has seen cardiology. A coronary CTA showed minimal plaque with no blockages, and an echocardiogram was normal.  She reports a persistent cough since having COVID around Christmas, with green and clear phlegm. No wheezing or postnasal drip.  She experiences recurrent yeast infections and suspects possible bacterial  vaginosis. She has an IUD that has not been checked in five years and reports an unusual odor. She uses nystatin  powder for a yeast infection under her breast but finds it ineffective.  She is concerned about her insurance being canceled due to her husband's income and is exploring options for government assistance. She is worried about affording her medications, especially for diabetes, without insurance.  She reports feeling constantly tired and not sleeping well. She has not had a sleep study. She was advised to restart B12 supplementation as her levels were low.  She has not seen a therapist but is considering it, especially in light of her fibromyalgia diagnosis.         Social history:  Relevant past medical, surgical, family and social history reviewed and updated as indicated. Interim medical history since our last visit reviewed.  Allergies and medications reviewed and updated.  DATA REVIEWED: CHART IN EPIC     ROS: Negative unless specifically indicated above in HPI.   Current Medications[1]        Objective:        BP 110/76 (BP Location: Right Arm, Patient Position: Sitting, Cuff Size: Large)   Pulse (!) 108   Temp 98.2 F (36.8 C) (Temporal)   Ht 5' 2 (1.575 m)   Wt 188 lb (85.3 kg)   SpO2 98%   BMI 34.39 kg/m   Physical Exam EXTREMITIES: Right elbow is tender and swollen.  Wt Readings from Last 3 Encounters:  02/12/24 188 lb (85.3 kg)  12/30/23 189 lb 6.4 oz (85.9 kg)  12/22/23 183 lb 4 oz (83.1  kg)    Physical Exam Vitals reviewed.  Constitutional:      General: She is not in acute distress.    Appearance: Normal appearance. She is normal weight. She is not ill-appearing, toxic-appearing or diaphoretic.  HENT:     Head: Normocephalic.     Right Ear: Tympanic membrane normal.     Left Ear: Tympanic membrane normal.     Nose: Nose normal.     Mouth/Throat:     Mouth: Mucous membranes are dry.     Pharynx: No oropharyngeal exudate or  posterior oropharyngeal erythema.  Eyes:     Extraocular Movements: Extraocular movements intact.     Pupils: Pupils are equal, round, and reactive to light.  Cardiovascular:     Rate and Rhythm: Normal rate and regular rhythm.     Pulses: Normal pulses.     Heart sounds: Normal heart sounds.  Pulmonary:     Effort: Pulmonary effort is normal.     Breath sounds: Normal breath sounds.  Musculoskeletal:     Left upper arm: Tenderness present.     Left elbow: Swelling present. Decreased range of motion. Tenderness present.     Cervical back: Normal range of motion.  Neurological:     General: No focal deficit present.     Mental Status: She is alert and oriented to person, place, and time. Mental status is at baseline.  Psychiatric:        Mood and Affect: Mood normal.        Behavior: Behavior normal.        Thought Content: Thought content normal.        Judgment: Judgment normal.          Results Labs ANA: Positive ESR: Elevated CRP: Elevated A1c: 9.4% decreased from 15% Vitamin B12: Low, in the 200s  Radiology Cervical spine MRI: Central canal stenosis at C3-C4, mild central canal stenosis at C4-C5 and C6-C7, multilevel foraminal stenosis Coronary CTA (08/2023): Minimal coronary artery plaque, no obstructive disease  Diagnostic Echocardiogram (08/2023): Normal  Assessment & Plan:   Assessment and Plan Assessment & Plan Vulvovaginal candidiasis and intertrigo Persistent yeast infection under the breast with raw flesh and white discharge. Nystatin  powder used for a week without significant improvement. - Changed nystatin  to nystatin  with triamcinolone  for more aggressive treatment. - Provided Diflucan  prescription with instructions to take one now and another in three days if symptoms persist.  Vaginal disorder, possible bacterial vaginosis Reports of odor and recurrent yeast infections. Possible bacterial vaginosis considered. - Performed wet prep swab to check  for bacterial vaginosis or yeast infection.  Pain and swelling of left upper extremity, post-injection Severe pain and swelling in the left upper extremity following recent shoulder and elbow injections. Possible nerve involvement or blood clot considered. - Ordered upper arm extremity ultrasound to check for blood clot. - Advised to contact the orthopedist to discuss the reaction to the injection.  Spondylosis of cervical spine with possible cervical radiculopathy Central stenosis at C3-C4 and mild stenosis at C4-C5 and C6-C7 with multilevel foraminal stenosis. Pain suspected to be shoulder-mediated. Neurosurgery recommended physical therapy and cervical injections, but decision to hold until shoulder issues are addressed. - Continue follow-up with orthopedist for shoulder issues. - Will consider physical therapy and cervical injections once shoulder issues are resolved.  Fibromyalgia Diagnosis confirmed by rheumatology. Myalgias suspected to contribute to fibromyalgia. - Continue current management and consider therapy for fibromyalgia once insurance is resolved.  Major depressive disorder, recurrent, moderate, and  anxiety Current treatment with sertraline . Considering addition of buspirone  for anxiety management. - Added buspirone  to current sertraline  regimen, twice daily dosing. - Provided 180 pills for 90 days supply.  Acute lower respiratory infection Persistent cough since COVID infection in December, with green and clear phlegm production. No wheezing or significant findings on examination. - Prescribed antibiotic for prolonged cough. - Recommended starting allergy medication such as Claritin.  Type 1 diabetes mellitus, poorly controlled Significant improvement in A1c from 15% to 9.4% after switching to isibloom pump. Ozempic  not restarted due to severe diarrhea. Insurance issues may affect medication access. - Continue isibloom pump and monitor blood glucose levels. - Advised  to contact endocrinologist regarding Ozempic  side effects and insurance issues. - Recommended yearly eye exams.  Psoriasis Current treatment includes steroid cream, which is well-tolerated. - Continue current steroid cream treatment.        Return in about 6 weeks (around 03/25/2024) for ideally six week f/u however may be without insurance so pt to f/u once able.     Ginger Patrick, MSN, APRN, FNP-C [facility]        [1]  Current Outpatient Medications:    Accu-Chek Softclix Lancets lancets, Use to check blood sugar 3-4 times daily, Disp: 100 each, Rfl: 12   ALPRAZolam  (XANAX ) 0.25 MG tablet, Take 1 tablet (0.25 mg total) by mouth 2 (two) times daily as needed for anxiety., Disp: 30 tablet, Rfl: 0   amLODipine  (NORVASC ) 10 MG tablet, Take 1 tablet (10 mg total) by mouth daily., Disp: 90 tablet, Rfl: 0   amoxicillin -clavulanate (AUGMENTIN ) 875-125 MG tablet, Take 1 tablet by mouth 2 (two) times daily., Disp: 20 tablet, Rfl: 0   aspirin  EC 81 MG tablet, Take 1 tablet (81 mg total) by mouth daily. Swallow whole., Disp: , Rfl:    atorvastatin  (LIPITOR) 80 MG tablet, Take 1 tablet (80 mg total) by mouth daily., Disp: 90 tablet, Rfl: 3   Blood Glucose Monitoring Suppl DEVI, 1 each by Does not apply route in the morning, at noon, and at bedtime. May substitute to any manufacturer covered by patient's insurance., Disp: 1 each, Rfl: 0   busPIRone  (BUSPAR ) 5 MG tablet, Take 1 tablet (5 mg total) by mouth 2 (two) times daily., Disp: 180 tablet, Rfl: 0   Continuous Glucose Sensor (DEXCOM G7 SENSOR) MISC, Use to monitor glucose continuously change every 10 days, Disp: 6 each, Rfl: 1   cyclobenzaprine  (FLEXERIL ) 5 MG tablet, Take 0.5-1 tablets (2.5-5 mg total) by mouth at bedtime., Disp: 30 tablet, Rfl: 0   dorzolamide-timolol (COSOPT) 2-0.5 % ophthalmic solution, 1 drop 2 (two) times daily., Disp: , Rfl:    EPINEPHrine  0.3 mg/0.3 mL IJ SOAJ injection, Inject 0.3 mg  into the muscle as needed for anaphylaxis., Disp: 2 each, Rfl: 0   fluconazole  (DIFLUCAN ) 150 MG tablet, Take one po every day for one dose , repeat again in 3 days if still with symptoms, and repeat again in three days if still with symptoms, Disp: 3 tablet, Rfl: 0   fluticasone  (FLONASE ) 50 MCG/ACT nasal spray, Place 2 sprays into both nostrils daily., Disp: 16 g, Rfl: 0   gabapentin  (NEURONTIN ) 300 MG capsule, TAKE 1 CAPSULE BY MOUTH THREE TIMES DAILY, Disp: 90 capsule, Rfl: 2   Glucose Blood (BLOOD GLUCOSE TEST STRIPS) STRP, 1 each by In Vitro route in the morning, at noon, and at bedtime. May substitute to any manufacturer covered by patient's insurance., Disp: 100 strip, Rfl: 5  Insulin  Aspart, w/Niacinamide, (FIASP  PUMPCART) 100 UNIT/ML SOCT, Use up to 130 units in the insulin  pump daily, Disp: 90 mL, Rfl: 3   Insulin  Aspart, w/Niacinamide, (FIASP ) 100 UNIT/ML SOLN, Use up to 100 units in the insulin  pump daily, Disp: 90 mL, Rfl: 3   insulin  glargine (LANTUS ) 100 UNIT/ML injection, Inject 0.3 mLs (30 Units total) into the skin 2 (two) times daily., Disp: 30 mL, Rfl: 11   Insulin  Pen Needle 32G X 4 MM MISC, Use 4x a day, Disp: 300 each, Rfl: 3   ketoconazole  (NIZORAL ) 2 % cream, Apply 1 application topically in the morning and at bedtime., Disp: 60 g, Rfl: 5   Lancets MISC, 1 each by Does not apply route as directed. Dispense based on patient and insurance preference. Use up to four times daily as directed. (FOR ICD-10 E10.9, E11.9)., Disp: 100 each, Rfl: 0   levonorgestrel  (MIRENA ) 20 MCG/DAY IUD, 1 each by Intrauterine route once., Disp: , Rfl:    nystatin -triamcinolone  ointment (MYCOLOG), Apply 1 Application topically 2 (two) times daily., Disp: 30 g, Rfl: 0   ondansetron  (ZOFRAN  ODT) 4 MG disintegrating tablet, Take 1 tablet (4 mg total) by mouth every 8 (eight) hours as needed for nausea or vomiting., Disp: 20 tablet, Rfl: 0   Semaglutide , 1 MG/DOSE, (OZEMPIC , 1 MG/DOSE,) 4 MG/3ML SOPN,  Inject 1 mg into the skin once a week., Disp: 9 mL, Rfl: 3   sertraline  (ZOLOFT ) 100 MG tablet, Take 2 tablets (200 mg total) by mouth daily., Disp: 180 tablet, Rfl: 0   valACYclovir  (VALTREX ) 1000 MG tablet, Take 2 tablets (2,000 mg total) by mouth daily. And repeat once in 12 hours---for cold sore, Disp: 20 tablet, Rfl: 0  "

## 2024-02-13 ENCOUNTER — Ambulatory Visit: Payer: Self-pay | Admitting: Family

## 2024-02-13 LAB — WET PREP BY MOLECULAR PROBE
Candida species: NOT DETECTED
Gardnerella vaginalis: NOT DETECTED
MICRO NUMBER:: 17526872
SPECIMEN QUALITY:: ADEQUATE
Trichomonas vaginosis: NOT DETECTED

## 2024-04-01 ENCOUNTER — Encounter: Admitting: Family

## 2024-04-27 ENCOUNTER — Ambulatory Visit: Admitting: Internal Medicine
